# Patient Record
Sex: Male | Born: 1955 | Race: Black or African American | Hispanic: No | Marital: Married | Smoking: Former smoker
Health system: Southern US, Community
[De-identification: ages and names within clinical notes are randomized; demographics above are authoritative.]

## PROBLEM LIST (undated history)

## (undated) ENCOUNTER — Ambulatory Visit (HOSPITAL_COMMUNITY): Admission: EM | Payer: No Typology Code available for payment source

## (undated) DIAGNOSIS — E7800 Pure hypercholesterolemia, unspecified: Secondary | ICD-10-CM

## (undated) DIAGNOSIS — C801 Malignant (primary) neoplasm, unspecified: Secondary | ICD-10-CM

## (undated) DIAGNOSIS — S21339A Puncture wound without foreign body of unspecified front wall of thorax with penetration into thoracic cavity, initial encounter: Secondary | ICD-10-CM

## (undated) DIAGNOSIS — W3400XA Accidental discharge from unspecified firearms or gun, initial encounter: Secondary | ICD-10-CM

## (undated) DIAGNOSIS — I10 Essential (primary) hypertension: Secondary | ICD-10-CM

## (undated) HISTORY — PX: KNEE SURGERY: SHX244

## (undated) HISTORY — PX: ANKLE SURGERY: SHX546

---

## 2008-05-12 DIAGNOSIS — K7460 Unspecified cirrhosis of liver: Secondary | ICD-10-CM | POA: Diagnosis present

## 2011-10-12 ENCOUNTER — Encounter (HOSPITAL_COMMUNITY): Payer: Self-pay | Admitting: Emergency Medicine

## 2011-10-12 ENCOUNTER — Emergency Department (HOSPITAL_COMMUNITY)
Admission: EM | Admit: 2011-10-12 | Discharge: 2011-10-12 | Disposition: A | Discharge status: Home / Self Care | Payer: No Typology Code available for payment source | Attending: Emergency Medicine | Admitting: Emergency Medicine

## 2011-10-12 ENCOUNTER — Emergency Department (HOSPITAL_COMMUNITY): Payer: No Typology Code available for payment source

## 2011-10-12 DIAGNOSIS — Z79899 Other long term (current) drug therapy: Secondary | ICD-10-CM | POA: Insufficient documentation

## 2011-10-12 DIAGNOSIS — Z87891 Personal history of nicotine dependence: Secondary | ICD-10-CM | POA: Insufficient documentation

## 2011-10-12 DIAGNOSIS — E7800 Pure hypercholesterolemia, unspecified: Secondary | ICD-10-CM | POA: Insufficient documentation

## 2011-10-12 DIAGNOSIS — I10 Essential (primary) hypertension: Secondary | ICD-10-CM | POA: Insufficient documentation

## 2011-10-12 DIAGNOSIS — S40019A Contusion of unspecified shoulder, initial encounter: Secondary | ICD-10-CM

## 2011-10-12 DIAGNOSIS — Z794 Long term (current) use of insulin: Secondary | ICD-10-CM | POA: Insufficient documentation

## 2011-10-12 DIAGNOSIS — E119 Type 2 diabetes mellitus without complications: Secondary | ICD-10-CM | POA: Insufficient documentation

## 2011-10-12 DIAGNOSIS — Z043 Encounter for examination and observation following other accident: Secondary | ICD-10-CM | POA: Insufficient documentation

## 2011-10-12 HISTORY — DX: Pure hypercholesterolemia, unspecified: E78.00

## 2011-10-12 HISTORY — DX: Essential (primary) hypertension: I10

## 2011-10-12 HISTORY — DX: Puncture wound without foreign body of unspecified front wall of thorax with penetration into thoracic cavity, initial encounter: S21.339A

## 2011-10-12 HISTORY — DX: Accidental discharge from unspecified firearms or gun, initial encounter: W34.00XA

## 2011-10-12 MED ORDER — CYCLOBENZAPRINE HCL 10 MG PO TABS: 10.0000 mg | ORAL_TABLET | Freq: Two times a day (BID) | ORAL | Status: AC | PRN

## 2011-10-12 MED ORDER — IBUPROFEN 800 MG PO TABS: 800.0000 mg | ORAL_TABLET | Freq: Three times a day (TID) | ORAL | Status: AC

## 2011-10-12 NOTE — ED Notes (Signed)
CBG @ 241
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
## 2011-10-12 NOTE — Discharge Instructions (Signed)
Contusion  A contusion is a deep bruise. Contusions are the result of an injury that caused bleeding under the skin. The contusion may turn blue, purple, or yellow. Minor injuries will give you a painless contusion, but more severe contusions may stay painful and swollen for a few weeks.   CAUSES   A contusion is usually caused by a blow, trauma, or direct force to an area of the body.  SYMPTOMS    Swelling and redness of the injured area.   Bruising of the injured area.   Tenderness and soreness of the injured area.   Pain.  DIAGNOSIS   The diagnosis can be made by taking a history and physical exam. An X-ray, CT scan, or MRI may be needed to determine if there were any associated injuries, such as fractures.  TREATMENT   Specific treatment will depend on what area of the body was injured. In general, the best treatment for a contusion is resting, icing, elevating, and applying cold compresses to the injured area. Over-the-counter medicines may also be recommended for pain control. Ask your caregiver what the best treatment is for your contusion.  HOME CARE INSTRUCTIONS    Put ice on the injured area.   Put ice in a plastic bag.   Place a towel between your skin and the bag.   Leave the ice on for 15 to 20 minutes, 3 to 4 times a day.   Only take over-the-counter or prescription medicines for pain, discomfort, or fever as directed by your caregiver. Your caregiver may recommend avoiding anti-inflammatory medicines (aspirin, ibuprofen, and naproxen) for 48 hours because these medicines may increase bruising.   Rest the injured area.   If possible, elevate the injured area to reduce swelling.  SEEK IMMEDIATE MEDICAL CARE IF:    You have increased bruising or swelling.   You have pain that is getting worse.   Your swelling or pain is not relieved with medicines.  MAKE SURE YOU:    Understand these instructions.   Will watch your condition.   Will get help right away if you are not doing well or get  worse.  Document Released: 02/05/2005 Document Revised: 04/17/2011 Document Reviewed: 03/03/2011  ExitCare Patient Information 2012 [employer].  Motor Vehicle Collision   It is common to have multiple bruises and sore muscles after a motor vehicle collision (MVC). These tend to feel worse for the first 24 hours. You may have the most stiffness and soreness over the first several hours. You may also feel worse when you wake up the first morning after your collision. After this point, you will usually begin to improve with each day. The speed of improvement often depends on the severity of the collision, the number of injuries, and the location and nature of these injuries.  HOME CARE INSTRUCTIONS    Put ice on the injured area.   Put ice in a plastic bag.   Place a towel between your skin and the bag.   Leave the ice on for 15 to 20 minutes, 3 to 4 times a day.   Drink enough fluids to keep your urine clear or pale yellow. Do not drink alcohol.   Take a warm shower or bath once or twice a day. This will increase blood flow to sore muscles.   You may return to activities as directed by your caregiver. Be careful when lifting, as this may aggravate neck or back pain.   Only take over-the-counter or prescription medicines   for pain, discomfort, or fever as directed by your caregiver. Do not use aspirin. This may increase bruising and bleeding.  SEEK IMMEDIATE MEDICAL CARE IF:   You have numbness, tingling, or weakness in the arms or legs.   You develop severe headaches not relieved with medicine.   You have severe neck pain, especially tenderness in the middle of the back of your neck.   You have changes in bowel or bladder control.   There is increasing pain in any area of the body.   You have shortness of breath, lightheadedness, dizziness, or fainting.   You have chest pain.   You feel sick to your stomach (nauseous), throw up (vomit), or sweat.   You have increasing abdominal  discomfort.   There is blood in your urine, stool, or vomit.   You have pain in your shoulder (shoulder strap areas).   You feel your symptoms are getting worse.  MAKE SURE YOU:    Understand these instructions.   Will watch your condition.   Will get help right away if you are not doing well or get worse.  Document Released: 04/28/2005 Document Revised: 04/17/2011 Document Reviewed: 09/25/2010  ExitCare Patient Information 2012 [employer].
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
## 2013-09-01 ENCOUNTER — Emergency Department (HOSPITAL_COMMUNITY)
Admission: EM | Admit: 2013-09-01 | Discharge: 2013-09-02 | Disposition: A | Discharge status: Home / Self Care | Payer: Non-veteran care | Attending: Emergency Medicine | Admitting: Emergency Medicine

## 2013-09-01 ENCOUNTER — Encounter (HOSPITAL_COMMUNITY): Payer: Self-pay | Admitting: Emergency Medicine

## 2013-09-01 DIAGNOSIS — K047 Periapical abscess without sinus: Secondary | ICD-10-CM | POA: Insufficient documentation

## 2013-09-01 DIAGNOSIS — E119 Type 2 diabetes mellitus without complications: Secondary | ICD-10-CM | POA: Insufficient documentation

## 2013-09-01 DIAGNOSIS — Z794 Long term (current) use of insulin: Secondary | ICD-10-CM | POA: Insufficient documentation

## 2013-09-01 DIAGNOSIS — Z79899 Other long term (current) drug therapy: Secondary | ICD-10-CM | POA: Insufficient documentation

## 2013-09-01 DIAGNOSIS — I10 Essential (primary) hypertension: Secondary | ICD-10-CM | POA: Insufficient documentation

## 2013-09-01 DIAGNOSIS — E7800 Pure hypercholesterolemia, unspecified: Secondary | ICD-10-CM | POA: Insufficient documentation

## 2013-09-01 DIAGNOSIS — R609 Edema, unspecified: Secondary | ICD-10-CM | POA: Insufficient documentation

## 2013-09-01 DIAGNOSIS — Z87891 Personal history of nicotine dependence: Secondary | ICD-10-CM | POA: Insufficient documentation

## 2013-09-01 DIAGNOSIS — K029 Dental caries, unspecified: Secondary | ICD-10-CM

## 2013-09-01 DIAGNOSIS — K0262 Dental caries on smooth surface penetrating into dentin: Secondary | ICD-10-CM | POA: Insufficient documentation

## 2013-09-01 LAB — CBG MONITORING, ED: GLUCOSE-CAPILLARY: 88 mg/dL (ref 70–99)

## 2013-09-01 MED ORDER — PENICILLIN V POTASSIUM 500 MG PO TABS: 500.0000 mg | ORAL_TABLET | Freq: Four times a day (QID) | ORAL | Status: AC

## 2013-09-01 MED ORDER — OXYCODONE-ACETAMINOPHEN 5-325 MG PO TABS
2.0000 | ORAL_TABLET | Freq: Once | ORAL | Status: AC
  Administered 2013-09-02: 2 via ORAL
  Filled 2013-09-01: qty 2

## 2013-09-01 MED ORDER — OXYCODONE-ACETAMINOPHEN 5-325 MG PO TABS: 2.0000 | ORAL_TABLET | ORAL | Status: DC | PRN

## 2013-09-01 MED ORDER — PENICILLIN V POTASSIUM 250 MG PO TABS
500.0000 mg | ORAL_TABLET | Freq: Once | ORAL | Status: AC
  Administered 2013-09-02: 500 mg via ORAL
  Filled 2013-09-01: qty 2

## 2013-09-01 NOTE — ED Notes (Signed)
Patient to ED with C/O pain in his left upper jaw.  States that his teeth are going bad.  Swelling is noted in his left upper lip that extends to his eye.  Area is very painful to touch.  No drainage is noted.

## 2013-09-01 NOTE — Discharge Instructions (Signed)
Abscessed Tooth An abscessed tooth is an infection around your tooth. It may be caused by holes or damage to the tooth (cavity) or a dental disease. An abscessed tooth causes mild to very bad pain in and around the tooth. See your dentist right away if you have tooth or gum pain. HOME CARE  Take your medicine as told. Finish it even if you start to feel better.  Do not drive after taking pain medicine.  Rinse your mouth (gargle) often with salt water ( teaspoon salt in 8 ounces of warm water).  Do not apply heat to the outside of your face. GET HELP RIGHT AWAY IF:   You have a temperature by mouth above 102 F (38.9 C), not controlled by medicine.  You have chills and a very bad headache.  You have problems breathing or swallowing.  Your mouth will not open.  You develop puffiness (swelling) on the neck or around the eye.  Your pain is not helped by medicine. Dental Care and Dentist Visits Dental care supports good overall health. Regular dental visits can also help you avoid dental pain, bleeding, infection, and other more serious health problems in the future. It is important to keep the mouth healthy because diseases in the teeth, gums, and other oral tissues can spread to other areas of the body. Some problems, such as diabetes, heart disease, and pre-term labor have been associated with poor oral health.  See your dentist every 6 months. If you experience emergency problems such as a toothache or broken tooth, go to the dentist right away. If you see your dentist regularly, you may catch problems early. It is easier to be treated for problems in the early stages.  WHAT TO EXPECT AT A DENTIST VISIT  Your dentist will look for many common oral health problems and recommend proper treatment. At your regular dental visit, you can expect: Gentle cleaning of the teeth and gums. This includes scraping and polishing. This helps to remove the sticky substance around the teeth and gums  (plaque). Plaque forms in the mouth shortly after eating. Over time, plaque hardens on the teeth as tartar. If tartar is not removed regularly, it can cause problems. Cleaning also helps remove stains. Periodic X-rays. These pictures of the teeth and supporting bone will help your dentist assess the health of your teeth. Periodic fluoride treatments. Fluoride is a natural mineral shown to help strengthen teeth. Fluoride treatmentinvolves applying a fluoride gel or varnish to the teeth. It is most commonly done in children. Examination of the mouth, tongue, jaws, teeth, and gums to look for any oral health problems, such as: Cavities (dental caries). This is decay on the tooth caused by plaque, sugar, and acid in the mouth. It is best to catch a cavity when it is small. Inflammation of the gums caused by plaque buildup (gingivitis). Problems with the mouth or malformed or misaligned teeth. Oral cancer or other diseases of the soft tissues or jaws. KEEP YOUR TEETH AND GUMS HEALTHY For healthy teeth and gums, follow these general guidelines as well as your dentist's specific advice: Have your teeth professionally cleaned at the dentist every 6 months. Brush twice daily with a fluoride toothpaste. Floss your teeth daily. Ask your dentist if you need fluoride supplements, treatments, or fluoride toothpaste. Eat a healthy diet. Reduce foods and drinks with added sugar. Avoid smoking. TREATMENT FOR ORAL HEALTH PROBLEMS If you have oral health problems, treatment varies depending on the conditions present in your teeth  and gums. Your caregiver will most likely recommend good oral hygiene at each visit. For cavities, gingivitis, or other oral health disease, your caregiver will perform a procedure to treat the problem. This is typically done at a separate appointment. Sometimes your caregiver will refer you to another dental specialist for specific tooth problems or for surgery. SEEK IMMEDIATE DENTAL  CARE IF: You have pain, bleeding, or soreness in the gum, tooth, jaw, or mouth area. A permanent tooth becomes loose or separated from the gum socket. You experience a blow or injury to the mouth or jaw area. Document Released: 01/08/2011 Document Revised: 07/21/2011 Document Reviewed: 01/08/2011 [facility] Patient Information 2014 [place], [place].  Dental Pain Toothache is pain in or around a tooth. It may get worse with chewing or with cold or heat.  HOME CARE Your dentist may use a numbing medicine during treatment. If so, you may need to avoid eating until the medicine wears off. Ask your dentist about this. Only take medicine as told by your dentist or doctor. Avoid chewing food near the painful tooth until after all treatment is done. Ask your dentist about this. GET HELP RIGHT AWAY IF:  The problem gets worse or new problems appear. You have a fever. There is redness and puffiness (swelling) of the face, jaw, or neck. You cannot open your mouth. There is pain in the jaw. There is very bad pain that is not helped by medicine. MAKE SURE YOU:  Understand these instructions. Will watch your condition. Will get help right away if you are not doing well or get worse. Document Released: 10/15/2007 Document Revised: 07/21/2011 Document Reviewed: 10/15/2007 [facility] Patient Information 2014 [place], [place].   Your pain is getting worse instead of better. MAKE SURE YOU:   Understand these instructions.  Will watch your condition.  Will get help right away if you are not doing well or get worse. Document Released: 10/15/2007 Document Revised: 07/21/2011 Document Reviewed: 08/06/2010 [facility] Patient Information 2014 [place].  If you were a [facility] patient or do not have insurance, here are some clinics in our community that may be able to provide care for free or on sliding payment scales.  [facility], [address] [place], [phone]; Monday to Friday, 9 a.m. - 7 p.m.; Saturday 9 a.m. to 1 p.m.   [facility], [place] [place]; [phone]; or [address]  [place]; [phone].    [facility] [place], [place] [address] [place]; [phone]; Monday to Wednesday, 8:30 a.m. - 5 p.m.; Thursday, 8:30 a.m. - 8 p.m.   [facility], [place], [place]; [phone]; Monday to Friday, 8 a.m. - 4:30 p.m.    [facility], [place] [address], [place], [place]; first and third Saturday of the  month, 9:30 a.m. - 12:30 p.m.   [facility], [address], [place], [place]; second Saturday of the month, 9 a.m. -noon.   RESOURCE GUIDE  Dental Problems  Patients with Medicaid: [place].  [place] Phone:  [phone]                                                                             Phone:  [phone]  If unable to pay or uninsured, contact:  [facility] or [facility]. to become qualified for the adult dental clinic.  Chronic Pain Problems Contact [facility]  [phone] Patients need to be referred by their primary care doctor.  Insufficient Money for Medicine Contact [employer]:  call "211" or [place] [phone].  No Primary Care Doctor Call [facility]  [phone] Other agencies that provide inexpensive medical care    [place]  [phone]    [facility]  [phone]    [facility]  [phone]    [facility]  [place]  [phone] [facility]  [phone] [place]   [phone] (emergency services [phone])  Abuse/Neglect [place] [phone] [place] [phone] (After Hours)  Emergency [place] [phone]  [facility] [place] [phone] [place] [phone]  MRSA Hotline #:   574-485-3263  [facility] Dept. [address]          [place] Phone:  [phone]                                     Phone:  [phone]                   Phone:  [place] Phone:  [place] [phone] (After Hours)   Community Resources: *IF YOU ARE IN IMMEDIATE DANGER CALL 911!  Abuse/Neglect:  Family Services Crisis Hotline [facility]): ([phone] [facility] Against Violence [facility]): 3194244691  After hours, holidays and weekends: [phone] National Domestic Violence Hotline: [place]: [place]: [place]: ([phone]  Health Clinics:  Urgent [place] ([place]): [phone] Monday - Friday  8 AM - 9 PM, Saturday and Sunday 10 AM - 9 PM   [place]: [phone] Monday- Friday 8:30 AM - 5:30 PM, Sat 9 AM - 1 PM  24 HR [place] [facility] [address]: [phone] [facility]: [phone] [facility] [address]: [phone]  [place] [facility]: [address] [phone]

## 2013-09-01 NOTE — ED Notes (Signed)
CBG 88 
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
## 2013-09-01 NOTE — ED Notes (Signed)
Pt reports upper dental pain x months that has progressively increased x 3 days. States "my gums just feel so sore." NAD.

## 2014-06-05 ENCOUNTER — Emergency Department (HOSPITAL_COMMUNITY): Payer: Non-veteran care

## 2014-06-05 ENCOUNTER — Encounter (HOSPITAL_COMMUNITY): Payer: Self-pay | Admitting: Emergency Medicine

## 2014-06-05 ENCOUNTER — Emergency Department (HOSPITAL_COMMUNITY)
Admission: EM | Admit: 2014-06-05 | Discharge: 2014-06-06 | Disposition: A | Discharge status: Home / Self Care | Payer: Non-veteran care | Attending: Emergency Medicine | Admitting: Emergency Medicine

## 2014-06-05 DIAGNOSIS — Y92093 Driveway of other non-institutional residence as the place of occurrence of the external cause: Secondary | ICD-10-CM | POA: Insufficient documentation

## 2014-06-05 DIAGNOSIS — Z794 Long term (current) use of insulin: Secondary | ICD-10-CM | POA: Insufficient documentation

## 2014-06-05 DIAGNOSIS — E119 Type 2 diabetes mellitus without complications: Secondary | ICD-10-CM | POA: Diagnosis not present

## 2014-06-05 DIAGNOSIS — Y9389 Activity, other specified: Secondary | ICD-10-CM | POA: Insufficient documentation

## 2014-06-05 DIAGNOSIS — W000XXA Fall on same level due to ice and snow, initial encounter: Secondary | ICD-10-CM | POA: Insufficient documentation

## 2014-06-05 DIAGNOSIS — I10 Essential (primary) hypertension: Secondary | ICD-10-CM | POA: Diagnosis not present

## 2014-06-05 DIAGNOSIS — Y998 Other external cause status: Secondary | ICD-10-CM | POA: Insufficient documentation

## 2014-06-05 DIAGNOSIS — E780 Pure hypercholesterolemia: Secondary | ICD-10-CM | POA: Insufficient documentation

## 2014-06-05 DIAGNOSIS — W009XXA Unspecified fall due to ice and snow, initial encounter: Secondary | ICD-10-CM

## 2014-06-05 DIAGNOSIS — S8991XA Unspecified injury of right lower leg, initial encounter: Secondary | ICD-10-CM | POA: Diagnosis not present

## 2014-06-05 DIAGNOSIS — Z79899 Other long term (current) drug therapy: Secondary | ICD-10-CM | POA: Insufficient documentation

## 2014-06-05 DIAGNOSIS — S79911A Unspecified injury of right hip, initial encounter: Secondary | ICD-10-CM | POA: Insufficient documentation

## 2014-06-05 DIAGNOSIS — M25061 Hemarthrosis, right knee: Secondary | ICD-10-CM | POA: Insufficient documentation

## 2014-06-05 DIAGNOSIS — S99911A Unspecified injury of right ankle, initial encounter: Secondary | ICD-10-CM | POA: Insufficient documentation

## 2014-06-05 DIAGNOSIS — M25571 Pain in right ankle and joints of right foot: Secondary | ICD-10-CM

## 2014-06-05 DIAGNOSIS — S99921A Unspecified injury of right foot, initial encounter: Secondary | ICD-10-CM | POA: Insufficient documentation

## 2014-06-05 DIAGNOSIS — Z87891 Personal history of nicotine dependence: Secondary | ICD-10-CM | POA: Diagnosis not present

## 2014-06-05 DIAGNOSIS — M25461 Effusion, right knee: Secondary | ICD-10-CM

## 2014-06-05 DIAGNOSIS — M25551 Pain in right hip: Secondary | ICD-10-CM

## 2014-06-05 LAB — CBG MONITORING, ED: GLUCOSE-CAPILLARY: 112 mg/dL — AB (ref 70–99)

## 2014-06-05 MED ORDER — WHITE PETROLATUM GEL
Status: DC | PRN
  Administered 2014-06-05: 23:00:00 via TOPICAL
  Filled 2014-06-05: qty 5

## 2014-06-05 MED ORDER — HYDROMORPHONE HCL 1 MG/ML IJ SOLN
1.0000 mg | Freq: Once | INTRAMUSCULAR | Status: AC
  Administered 2014-06-05: 1 mg via INTRAMUSCULAR
  Filled 2014-06-05: qty 1

## 2014-06-05 MED ORDER — LIDOCAINE HCL (PF) 1 % IJ SOLN
5.0000 mL | Freq: Once | INTRAMUSCULAR | Status: AC
  Administered 2014-06-05: 5 mL via INTRADERMAL
  Filled 2014-06-05: qty 5
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
## 2014-06-05 NOTE — ED Notes (Signed)
Bed: LG49 Expected date: 06/05/14 Expected time: 7:59 PM Means of arrival: Ambulance Comments: Fall, ankle and knee pain

## 2014-06-05 NOTE — ED Notes (Signed)
Patient transported to X-ray 

## 2014-06-06 LAB — SYNOVIAL CELL COUNT + DIFF, W/ CRYSTALS
Crystals, Fluid: NONE SEEN
Eosinophils-Synovial: 0 % (ref 0–1)
LYMPHOCYTES-SYNOVIAL FLD: 4 % (ref 0–20)
MONOCYTE-MACROPHAGE-SYNOVIAL FLUID: 5 % — AB (ref 50–90)
NEUTROPHIL, SYNOVIAL: 91 % — AB (ref 0–25)
WBC, SYNOVIAL: 22000 /mm3 — AB (ref 0–200)

## 2014-06-06 MED ORDER — OXYCODONE-ACETAMINOPHEN 5-325 MG PO TABS
2.0000 | ORAL_TABLET | Freq: Once | ORAL | Status: AC
  Administered 2014-06-06: 2 via ORAL
  Filled 2014-06-06: qty 2

## 2014-06-06 MED ORDER — NAPROXEN 500 MG PO TABS: 500.0000 mg | ORAL_TABLET | Freq: Two times a day (BID) | ORAL | Status: DC

## 2014-06-06 MED ORDER — OXYCODONE-ACETAMINOPHEN 5-325 MG PO TABS: 1.0000 | ORAL_TABLET | ORAL | Status: DC | PRN

## 2014-06-06 MED ORDER — METHOCARBAMOL 500 MG PO TABS: 500.0000 mg | ORAL_TABLET | Freq: Two times a day (BID) | ORAL | Status: DC

## 2014-06-06 NOTE — Discharge Instructions (Signed)
1. Medications: robaxin, naproxyn, percocet, usual home medications 2. Treatment: rest, elevate, gentle stretching of you back if pain occurs, use ice 3. Follow Up: [facility] the [facility] in 3 days for discussion of your diagnoses and further evaluation after today's visit; if you do not have a primary care doctor use the resource guide provided to find one;  Return to the ER for back pain, worsening pain, fevers, loss of bowel or bladder control or other concerning symptoms  Knee Effusion The medical term for having fluid in your knee is effusion. This is often due to an internal derangement of the knee. This means something is wrong inside the knee. Some of the causes of fluid in the knee may be torn cartilage, a torn ligament, or bleeding into the joint from an injury. Your knee is likely more difficult to bend and move. This is often because there is increased pain and pressure in the joint. The time it takes for recovery from a knee effusion depends on different factors, including:   Type of injury.  Your age.  Physical and medical conditions.  Rehabilitation Strategies. How long you will be away from your normal activities will depend on what kind of knee problem you have and how much damage is present. Your knee has two types of cartilage. Articular cartilage covers the bone ends and lets your knee bend and move smoothly. Two menisci, thick pads of cartilage that form a rim inside the joint, help absorb shock and stabilize your knee. Ligaments bind the bones together and support your knee joint. Muscles move the joint, help support your knee, and take stress off the joint itself. CAUSES  Often an effusion in the knee is caused by an injury to one of the menisci. This is often a tear in the cartilage. Recovery after a meniscus injury depends on how much meniscus is damaged and whether you have damaged other knee tissue. Small tears may heal on their own with  conservative treatment. Conservative means rest, limited weight bearing activity and muscle strengthening exercises. Your recovery may take up to 6 weeks.  TREATMENT  Larger tears may require surgery. Meniscus injuries may be treated during arthroscopy. Arthroscopy is a procedure in which your surgeon uses a small telescope like instrument to look in your knee. Your caregiver can make a more accurate diagnosis (learning what is wrong) by performing an arthroscopic procedure. If your injury is on the inner margin of the meniscus, your surgeon may trim the meniscus back to a smooth rim. In other cases your surgeon will try to repair a damaged meniscus with stitches (sutures). This may make rehabilitation take longer, but may provide better long term result by helping your knee keep its shock absorption capabilities. Ligaments which are completely torn usually require surgery for repair. HOME CARE INSTRUCTIONS  Use crutches as instructed.  If a brace is applied, use as directed.  Once you are home, an ice pack applied to your swollen knee may help with discomfort and help decrease swelling.  Keep your knee raised (elevated) when you are not up and around or on crutches.  Only take over-the-counter or prescription medicines for pain, discomfort, or fever as directed by your caregiver.  Your caregivers will help with instructions for rehabilitation of your knee. This often includes strengthening exercises.  You may resume a normal diet and activities as directed. SEEK MEDICAL CARE IF:   There is increased swelling in your knee.  You notice  redness, swelling, or increasing pain in your knee.  An unexplained oral temperature above 102 F (38.9 C) develops. SEEK IMMEDIATE MEDICAL CARE IF:   You develop a rash.  You have difficulty breathing.  You have any allergic reactions from medications you may have been given.  There is severe pain with any motion of the knee. MAKE SURE YOU:    Understand these instructions.  Will watch your condition.  Will get help right away if you are not doing well or get worse. Document Released: 07/19/2003 Document Revised: 07/21/2011 Document Reviewed: 09/22/2007 [facility] Patient Information 2015 [place], [place]. This information is not intended to replace advice given to you by your health care provider. Make sure you discuss any questions you have with your health care provider.

## 2014-06-06 NOTE — ED Notes (Signed)
Pt ambulating independently w/ steady gait on d/c in no acute distress, A&Ox4. D/c instructions reviewed w/ pt and family - pt and family deny any further questions or concerns at present. Rx given x3  

## 2014-12-07 ENCOUNTER — Ambulatory Visit (INDEPENDENT_AMBULATORY_CARE_PROVIDER_SITE_OTHER): Payer: Self-pay | Admitting: Family Medicine

## 2014-12-07 VITALS — BP 122/84 | HR 92 | Temp 98.3°F | Resp 16 | Ht 69.25 in | Wt 180.0 lb

## 2014-12-07 DIAGNOSIS — Z008 Encounter for other general examination: Secondary | ICD-10-CM

## 2014-12-07 DIAGNOSIS — Z0289 Encounter for other administrative examinations: Secondary | ICD-10-CM
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
## 2019-05-13 DIAGNOSIS — D62 Acute posthemorrhagic anemia: Secondary | ICD-10-CM | POA: Diagnosis present

## 2019-09-11 ENCOUNTER — Encounter (HOSPITAL_COMMUNITY): Payer: Self-pay | Admitting: Emergency Medicine

## 2019-09-11 ENCOUNTER — Other Ambulatory Visit: Payer: Self-pay

## 2019-09-11 ENCOUNTER — Emergency Department (HOSPITAL_COMMUNITY)
Admission: EM | Admit: 2019-09-11 | Discharge: 2019-09-11 | Disposition: A | Discharge status: Home / Self Care | Payer: No Typology Code available for payment source | Attending: Emergency Medicine | Admitting: Emergency Medicine

## 2019-09-11 ENCOUNTER — Emergency Department (HOSPITAL_COMMUNITY): Payer: No Typology Code available for payment source

## 2019-09-11 DIAGNOSIS — R202 Paresthesia of skin: Secondary | ICD-10-CM | POA: Insufficient documentation

## 2019-09-11 DIAGNOSIS — Z5321 Procedure and treatment not carried out due to patient leaving prior to being seen by health care provider: Secondary | ICD-10-CM | POA: Insufficient documentation

## 2019-09-11 LAB — CBG MONITORING, ED: Glucose-Capillary: 45 mg/dL — ABNORMAL LOW (ref 70–99)

## 2019-09-11 LAB — BASIC METABOLIC PANEL
Anion gap: 9 (ref 5–15)
BUN: 20 mg/dL (ref 8–23)
CO2: 23 mmol/L (ref 22–32)
Calcium: 10.3 mg/dL (ref 8.9–10.3)
Chloride: 107 mmol/L (ref 98–111)
Creatinine, Ser: 1.81 mg/dL — ABNORMAL HIGH (ref 0.61–1.24)
GFR calc Af Amer: 44 mL/min — ABNORMAL LOW (ref >60)
GFR calc non Af Amer: 38 mL/min — ABNORMAL LOW (ref >60)
Glucose, Bld: 165 mg/dL — ABNORMAL HIGH (ref 70–99)
Potassium: 4.4 mmol/L (ref 3.5–5.1)
Sodium: 139 mmol/L (ref 135–145)

## 2019-09-11 LAB — CBC
HCT: 37.2 % — ABNORMAL LOW (ref 39.0–52.0)
Hemoglobin: 11.9 g/dL — ABNORMAL LOW (ref 13.0–17.0)
MCH: 29 pg (ref 26.0–34.0)
MCHC: 32 g/dL (ref 30.0–36.0)
MCV: 90.5 fL (ref 80.0–100.0)
Platelets: 166 10*3/uL (ref 150–400)
RBC: 4.11 MIL/uL — ABNORMAL LOW (ref 4.22–5.81)
RDW: 12.5 % (ref 11.5–15.5)
WBC: 6.7 10*3/uL (ref 4.0–10.5)
nRBC: 0 % (ref 0.0–0.2)

## 2019-09-11 LAB — TROPONIN I (HIGH SENSITIVITY): Troponin I (High Sensitivity): 5 ng/L (ref <18)

## 2019-09-11 MED ORDER — SODIUM CHLORIDE 0.9% FLUSH: 3.0000 mL | Freq: Once | INTRAVENOUS | Status: DC

## 2019-09-11 NOTE — ED Notes (Signed)
CBG 45, pt given orange juice, coke, and sandwich. RN [employer] notified

## 2019-09-11 NOTE — ED Notes (Signed)
Pt stopped NT in lobby and asked for something to eat or drink because he felt like sugar was low.  Pt's blood sugar checked and it was 45.  Pt given orange juice and soda by NT.  RN spoke with pt's wife that was concerned that no one had given pt something to drink prior to this time.  Explained that pt had asked for something to drink just after being triaged and that he was told to wait to see doctor. Pt did not mention blood sugar at that time and initial glucose on labs 165.  Attempted to talk to pt and he states he wants to be left alone and refuses to answer questions.  Wife had stepped outside and came back in again.  Questioned if pt had taken anything for his blood sugar since here or PTA since there was a drop from 165 to 45 in just over 1 hr.  Wife states when pt was at [facility] his blood sugar was 200 and he took his diabetes medications without eating and came to ED.  Pt wanting to leave.  Encouraged pt to stay and explained that pt had not mentioned feelings of hypoglycemia or that he had taken meds without eating just PTA.  Encouraged pt to eat and explained that we would check sugar again.  Pt states he is leaving and walked outside.  Wife states that she is not going to let him leave due to chest pain and she went outside to talk with him.

## 2019-09-11 NOTE — ED Triage Notes (Signed)
Pt sent from urgent care for further evaluation of left arm tingling and chest pain. Pt reports symptoms have been intermittent since receiving his first dose covid vaccine in March.

## 2019-12-12 ENCOUNTER — Other Ambulatory Visit: Payer: Self-pay | Admitting: Orthopedic Surgery

## 2019-12-12 DIAGNOSIS — M79662 Pain in left lower leg: Secondary | ICD-10-CM

## 2019-12-13 ENCOUNTER — Other Ambulatory Visit: Payer: Self-pay | Admitting: Orthopedic Surgery

## 2019-12-14 ENCOUNTER — Other Ambulatory Visit: Payer: No Typology Code available for payment source

## 2019-12-15 ENCOUNTER — Other Ambulatory Visit: Payer: Self-pay | Admitting: Orthopedic Surgery

## 2020-03-06 DIAGNOSIS — I10 Essential (primary) hypertension: Secondary | ICD-10-CM | POA: Diagnosis present

## 2020-03-06 DIAGNOSIS — G8929 Other chronic pain: Secondary | ICD-10-CM | POA: Insufficient documentation

## 2020-03-06 DIAGNOSIS — F32A Depression, unspecified: Secondary | ICD-10-CM | POA: Diagnosis present

## 2020-03-06 DIAGNOSIS — E1159 Type 2 diabetes mellitus with other circulatory complications: Secondary | ICD-10-CM | POA: Diagnosis present

## 2020-03-06 DIAGNOSIS — E785 Hyperlipidemia, unspecified: Secondary | ICD-10-CM | POA: Diagnosis present

## 2020-05-24 ENCOUNTER — Encounter (HOSPITAL_COMMUNITY): Payer: Self-pay

## 2020-05-24 ENCOUNTER — Emergency Department (HOSPITAL_COMMUNITY): Payer: No Typology Code available for payment source

## 2020-05-24 ENCOUNTER — Other Ambulatory Visit: Payer: Self-pay | Admitting: Student

## 2020-05-24 ENCOUNTER — Other Ambulatory Visit: Payer: Self-pay

## 2020-05-24 ENCOUNTER — Emergency Department (HOSPITAL_COMMUNITY)
Admission: EM | Admit: 2020-05-24 | Discharge: 2020-05-24 | Disposition: A | Discharge status: Home / Self Care | Payer: No Typology Code available for payment source | Attending: Emergency Medicine | Admitting: Emergency Medicine

## 2020-05-24 DIAGNOSIS — E785 Hyperlipidemia, unspecified: Secondary | ICD-10-CM

## 2020-05-24 DIAGNOSIS — Z87891 Personal history of nicotine dependence: Secondary | ICD-10-CM | POA: Insufficient documentation

## 2020-05-24 DIAGNOSIS — R079 Chest pain, unspecified: Secondary | ICD-10-CM | POA: Diagnosis present

## 2020-05-24 DIAGNOSIS — Z7984 Long term (current) use of oral hypoglycemic drugs: Secondary | ICD-10-CM | POA: Insufficient documentation

## 2020-05-24 DIAGNOSIS — I10 Essential (primary) hypertension: Secondary | ICD-10-CM

## 2020-05-24 DIAGNOSIS — I200 Unstable angina: Secondary | ICD-10-CM | POA: Diagnosis not present

## 2020-05-24 DIAGNOSIS — Z20822 Contact with and (suspected) exposure to covid-19: Secondary | ICD-10-CM | POA: Insufficient documentation

## 2020-05-24 DIAGNOSIS — Z794 Long term (current) use of insulin: Secondary | ICD-10-CM | POA: Diagnosis not present

## 2020-05-24 DIAGNOSIS — E119 Type 2 diabetes mellitus without complications: Secondary | ICD-10-CM | POA: Diagnosis not present

## 2020-05-24 DIAGNOSIS — R0789 Other chest pain: Secondary | ICD-10-CM

## 2020-05-24 DIAGNOSIS — Z79899 Other long term (current) drug therapy: Secondary | ICD-10-CM | POA: Insufficient documentation

## 2020-05-24 DIAGNOSIS — R55 Syncope and collapse: Secondary | ICD-10-CM

## 2020-05-24 LAB — CBC
HCT: 34.8 % — ABNORMAL LOW (ref 39.0–52.0)
Hemoglobin: 11.6 g/dL — ABNORMAL LOW (ref 13.0–17.0)
MCH: 30.1 pg (ref 26.0–34.0)
MCHC: 33.3 g/dL (ref 30.0–36.0)
MCV: 90.2 fL (ref 80.0–100.0)
Platelets: 168 10*3/uL (ref 150–400)
RBC: 3.86 MIL/uL — ABNORMAL LOW (ref 4.22–5.81)
RDW: 13.2 % (ref 11.5–15.5)
WBC: 6.2 10*3/uL (ref 4.0–10.5)
nRBC: 0 % (ref 0.0–0.2)

## 2020-05-24 LAB — TROPONIN I (HIGH SENSITIVITY)
Troponin I (High Sensitivity): 7 ng/L (ref <18)
Troponin I (High Sensitivity): 6 ng/L (ref <18)

## 2020-05-24 LAB — BASIC METABOLIC PANEL
Anion gap: 10 (ref 5–15)
BUN: 22 mg/dL (ref 8–23)
CO2: 25 mmol/L (ref 22–32)
Calcium: 9.6 mg/dL (ref 8.9–10.3)
Chloride: 104 mmol/L (ref 98–111)
Creatinine, Ser: 1.57 mg/dL — ABNORMAL HIGH (ref 0.61–1.24)
GFR, Estimated: 48 mL/min — ABNORMAL LOW (ref >60)
Glucose, Bld: 189 mg/dL — ABNORMAL HIGH (ref 70–99)
Potassium: 4.4 mmol/L (ref 3.5–5.1)
Sodium: 139 mmol/L (ref 135–145)

## 2020-05-24 LAB — CBG MONITORING, ED
Glucose-Capillary: 178 mg/dL — ABNORMAL HIGH (ref 70–99)
Glucose-Capillary: 144 mg/dL — ABNORMAL HIGH (ref 70–99)

## 2020-05-24 LAB — POC SARS CORONAVIRUS 2 AG -  ED: SARS Coronavirus 2 Ag: NEGATIVE

## 2020-05-24 NOTE — Discharge Instructions (Signed)
You been seen here for chest pain.  Lab work and imaging all look reassuring.  I recommend continue with your medications as prescribed.  I want you to follow-up with cardiology for further evaluation.  His office will contact you to schedule an outpatient stress test.  Come back to the emergency department if you develop chest pain, shortness of breath, severe abdominal pain, uncontrolled nausea, vomiting, diarrhea.

## 2020-05-24 NOTE — Progress Notes (Signed)
Ordered outpatient Lexiscan Myoview for further evaluation of chest pain. Please see consult note from today for more information.  The risks [chest pain, shortness of breath, cardiac arrhythmias, dizziness, blood pressure fluctuations, myocardial infarction, stroke/transient ischemic attack, nausea, vomiting, allergic reaction, radiation exposure, metallic taste sensation and life-threatening complications (estimated to be 1 in 10,000)], benefits (risk stratification, diagnosing coronary artery disease, treatment guidance) and alternatives of a nuclear stress test were discussed in detail with Mr. Sandy and he agrees to proceed.  Darreld Mclean, PA-C 05/24/2020 5:20 PM
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
## 2020-05-24 NOTE — ED Triage Notes (Signed)
Pt reports chest pain that radiates to his left arm and left side of his neck that started about 3 days ago. Denies n/v but also reports sob. Pt has had all his COVID vaccines plus booster. Pt a.o, resp e.u

## 2020-05-24 NOTE — ED Provider Notes (Signed)
Patient was received at shift change from [facility], he provided HPI, current work-up, likely disposition.  Please see his note for full detail.  In short patient with significant medical history of diabetes, hypertension, presents to the emergency room with chief complaint of intermittent left-sided chest pain rating to his neck down left arm for the last week.  Patient endorses associated shortness of breath and exertion making the pain worse.  Last episode of chest pain was this morning, currently chest pain-free at this time.  Patient has no complaints at this time.  He has no history of DVTs or PEs, not on hormone therapy, denies recent traumas, long immobilizations, leg pain or leg swelling.  Patient is vaccinated against COVID.   Per previous provider patient had a heart score of 7 and cardiology was consulted for further recommendations.  We'll wait for Dr. Warren Lacy recommendations for admission or close patient follow-up.    Current work-up shows no signs of leukocytosis, normocytic anemia appears to be at baseline for patient.  BMP shows no electrolyte abnormalities, no metabolic acidosis, slight hyperglycemia of 189, slight increase in creatinine of 1.57, no anion gap present.  Patient had initial troponin of 7-second opponent is 6, COVID test is negative.  EKG is normal sinus rhythm without signs of ischemia.  Chest x-ray does not reveal any acute findings. Physical Exam  BP (!) 181/97   Pulse 67   Temp 98.6 F (37 C) (Oral)   Resp 18   Ht 5\' 10"  (1.778 m)   Wt 79.4 kg   SpO2 100%   BMI 25.11 kg/m   Physical Exam Vitals and nursing note reviewed.  Constitutional:      General: He is not in acute distress.    Appearance: Normal appearance. He is not ill-appearing or diaphoretic.  HENT:     Head: Normocephalic and atraumatic.     Nose: No congestion or rhinorrhea.  Eyes:     Conjunctiva/sclera: Conjunctivae normal.  Cardiovascular:     Rate and Rhythm: Normal rate and  regular rhythm.  Pulmonary:     Effort: Pulmonary effort is normal.  Musculoskeletal:     Cervical back: Neck supple.     Right lower leg: No edema.     Left lower leg: No edema.     Comments: Patient is moving all 4 extremities out difficulty.  Skin:    General: Skin is warm and dry.     Coloration: Skin is not jaundiced or pale.  Neurological:     Mental Status: He is alert.     Comments:  Patient having no difficulty with word finding.  Psychiatric:        Mood and Affect: Mood normal.     ED Course/Procedures      Procedures  MDM  Dr. Warren Lacy of cardiology evaluated the patient and feels patient is safe for discharge.  He will arrange for outpatient stress test.  His office will contact the patient to schedule an appointment.  Vital signs have remained stable, no indication for hospital admission.   Patient given at home care as well strict return precautions.  Patient verbalized that they understood agreed to said plan.       Marcello Fennel, PA-C 05/24/20 1718    Horton, Alvin Critchley, DO 05/25/20 8299
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
## 2020-06-05 ENCOUNTER — Encounter (HOSPITAL_COMMUNITY): Payer: Self-pay | Admitting: Student

## 2020-06-19 ENCOUNTER — Telehealth (HOSPITAL_COMMUNITY): Payer: Self-pay | Admitting: Student

## 2020-06-19 NOTE — Telephone Encounter (Signed)
Just an FYI. We have made several attempts to contact this patient including sending a letter to schedule or reschedule their Myoview. We will be removing the patient from the echo/nuc  WQ.    06/05/20 MAILED LETTER [facility]  06/05/20 [facility] to schedule @ 9:57/[facility]  05/30/20 [facility] to schedule @ 11:19/[facility]  05/25/2020 [facility] to schedule @ 1:47/LBW       Thank you

## 2020-11-06 DIAGNOSIS — N1832 Chronic kidney disease, stage 3b: Secondary | ICD-10-CM | POA: Diagnosis present

## 2021-01-21 DIAGNOSIS — F1410 Cocaine abuse, uncomplicated: Secondary | ICD-10-CM | POA: Diagnosis present

## 2021-05-17 ENCOUNTER — Encounter (HOSPITAL_BASED_OUTPATIENT_CLINIC_OR_DEPARTMENT_OTHER): Payer: Self-pay | Admitting: Obstetrics and Gynecology

## 2021-05-17 ENCOUNTER — Other Ambulatory Visit: Payer: Self-pay

## 2021-05-17 DIAGNOSIS — R42 Dizziness and giddiness: Secondary | ICD-10-CM | POA: Insufficient documentation

## 2021-05-17 DIAGNOSIS — Z20822 Contact with and (suspected) exposure to covid-19: Secondary | ICD-10-CM | POA: Diagnosis not present

## 2021-05-17 DIAGNOSIS — R5383 Other fatigue: Secondary | ICD-10-CM | POA: Insufficient documentation

## 2021-05-17 DIAGNOSIS — R739 Hyperglycemia, unspecified: Secondary | ICD-10-CM | POA: Diagnosis present

## 2021-05-17 DIAGNOSIS — Z5321 Procedure and treatment not carried out due to patient leaving prior to being seen by health care provider: Secondary | ICD-10-CM | POA: Insufficient documentation

## 2021-05-17 LAB — RESP PANEL BY RT-PCR (FLU A&B, COVID) ARPGX2
Influenza A by PCR: NEGATIVE
Influenza B by PCR: NEGATIVE
SARS Coronavirus 2 by RT PCR: NEGATIVE

## 2021-05-17 LAB — CBG MONITORING, ED
Glucose-Capillary: 309 mg/dL — ABNORMAL HIGH (ref 70–99)
Glucose-Capillary: 297 mg/dL — ABNORMAL HIGH (ref 70–99)

## 2021-05-17 LAB — BASIC METABOLIC PANEL
Anion gap: 9 (ref 5–15)
BUN: 27 mg/dL — ABNORMAL HIGH (ref 8–23)
CO2: 25 mmol/L (ref 22–32)
Calcium: 10.2 mg/dL (ref 8.9–10.3)
Chloride: 99 mmol/L (ref 98–111)
Creatinine, Ser: 1.6 mg/dL — ABNORMAL HIGH (ref 0.61–1.24)
GFR, Estimated: 47 mL/min — ABNORMAL LOW (ref >60)
Glucose, Bld: 301 mg/dL — ABNORMAL HIGH (ref 70–99)
Potassium: 4.3 mmol/L (ref 3.5–5.1)
Sodium: 133 mmol/L — ABNORMAL LOW (ref 135–145)

## 2021-05-17 LAB — URINALYSIS, ROUTINE W REFLEX MICROSCOPIC
Bilirubin Urine: NEGATIVE
Glucose, UA: >1000 mg/dL — AB
Hgb urine dipstick: NEGATIVE
Ketones, ur: NEGATIVE mg/dL
Leukocytes,Ua: NEGATIVE
Nitrite: NEGATIVE
Protein, ur: NEGATIVE mg/dL
Specific Gravity, Urine: 1.031 — ABNORMAL HIGH (ref 1.005–1.030)
pH: 5.5 (ref 5.0–8.0)

## 2021-05-17 LAB — CBC
HCT: 37.6 % — ABNORMAL LOW (ref 39.0–52.0)
Hemoglobin: 12.3 g/dL — ABNORMAL LOW (ref 13.0–17.0)
MCH: 28 pg (ref 26.0–34.0)
MCHC: 32.7 g/dL (ref 30.0–36.0)
MCV: 85.5 fL (ref 80.0–100.0)
Platelets: 215 10*3/uL (ref 150–400)
RBC: 4.4 MIL/uL (ref 4.22–5.81)
RDW: 13.2 % (ref 11.5–15.5)
WBC: 7.3 10*3/uL (ref 4.0–10.5)
nRBC: 0 % (ref 0.0–0.2)

## 2021-05-17 NOTE — ED Triage Notes (Signed)
Patient reports to the ER for hyperglycemia, states it has been over 300 all day. Patient reports he has not been eating well but he has not been feeling well. Patient reports no abdominal pain but he has been feeling swimmy headed and fatigued.

## 2021-05-18 ENCOUNTER — Emergency Department (HOSPITAL_BASED_OUTPATIENT_CLINIC_OR_DEPARTMENT_OTHER)
Admission: EM | Admit: 2021-05-18 | Discharge: 2021-05-18 | Disposition: A | Discharge status: Home / Self Care | Payer: No Typology Code available for payment source | Attending: Emergency Medicine | Admitting: Emergency Medicine

## 2021-06-22 ENCOUNTER — Other Ambulatory Visit: Payer: Self-pay

## 2021-10-31 ENCOUNTER — Other Ambulatory Visit: Payer: Self-pay

## 2021-10-31 ENCOUNTER — Emergency Department (HOSPITAL_COMMUNITY): Payer: No Typology Code available for payment source

## 2021-10-31 ENCOUNTER — Encounter (HOSPITAL_COMMUNITY): Payer: Self-pay | Admitting: Emergency Medicine

## 2021-10-31 ENCOUNTER — Inpatient Hospital Stay (HOSPITAL_COMMUNITY)
Admission: EM | Admit: 2021-10-31 | Discharge: 2021-11-04 | DRG: 435 | Disposition: A | Discharge status: Home / Self Care | Payer: No Typology Code available for payment source | Attending: Internal Medicine | Admitting: Internal Medicine

## 2021-10-31 DIAGNOSIS — K766 Portal hypertension: Secondary | ICD-10-CM | POA: Diagnosis present

## 2021-10-31 DIAGNOSIS — D62 Acute posthemorrhagic anemia: Secondary | ICD-10-CM | POA: Diagnosis present

## 2021-10-31 DIAGNOSIS — Z7984 Long term (current) use of oral hypoglycemic drugs: Secondary | ICD-10-CM

## 2021-10-31 DIAGNOSIS — K219 Gastro-esophageal reflux disease without esophagitis: Secondary | ICD-10-CM | POA: Diagnosis present

## 2021-10-31 DIAGNOSIS — C220 Liver cell carcinoma: Secondary | ICD-10-CM | POA: Diagnosis present

## 2021-10-31 DIAGNOSIS — R160 Hepatomegaly, not elsewhere classified: Principal | ICD-10-CM

## 2021-10-31 DIAGNOSIS — I81 Portal vein thrombosis: Secondary | ICD-10-CM | POA: Diagnosis present

## 2021-10-31 DIAGNOSIS — I8510 Secondary esophageal varices without bleeding: Secondary | ICD-10-CM

## 2021-10-31 DIAGNOSIS — E7800 Pure hypercholesterolemia, unspecified: Secondary | ICD-10-CM | POA: Diagnosis present

## 2021-10-31 DIAGNOSIS — F1410 Cocaine abuse, uncomplicated: Secondary | ICD-10-CM | POA: Diagnosis present

## 2021-10-31 DIAGNOSIS — B3781 Candidal esophagitis: Secondary | ICD-10-CM | POA: Diagnosis not present

## 2021-10-31 DIAGNOSIS — E1159 Type 2 diabetes mellitus with other circulatory complications: Secondary | ICD-10-CM | POA: Diagnosis present

## 2021-10-31 DIAGNOSIS — Z8249 Family history of ischemic heart disease and other diseases of the circulatory system: Secondary | ICD-10-CM

## 2021-10-31 DIAGNOSIS — Z79899 Other long term (current) drug therapy: Secondary | ICD-10-CM | POA: Diagnosis not present

## 2021-10-31 DIAGNOSIS — E1122 Type 2 diabetes mellitus with diabetic chronic kidney disease: Secondary | ICD-10-CM | POA: Diagnosis present

## 2021-10-31 DIAGNOSIS — E11319 Type 2 diabetes mellitus with unspecified diabetic retinopathy without macular edema: Secondary | ICD-10-CM | POA: Diagnosis present

## 2021-10-31 DIAGNOSIS — Z800 Family history of malignant neoplasm of digestive organs: Secondary | ICD-10-CM | POA: Diagnosis not present

## 2021-10-31 DIAGNOSIS — Z87891 Personal history of nicotine dependence: Secondary | ICD-10-CM

## 2021-10-31 DIAGNOSIS — K229 Disease of esophagus, unspecified: Secondary | ICD-10-CM | POA: Diagnosis not present

## 2021-10-31 DIAGNOSIS — D649 Anemia, unspecified: Secondary | ICD-10-CM

## 2021-10-31 DIAGNOSIS — C801 Malignant (primary) neoplasm, unspecified: Secondary | ICD-10-CM

## 2021-10-31 DIAGNOSIS — F32A Depression, unspecified: Secondary | ICD-10-CM | POA: Diagnosis present

## 2021-10-31 DIAGNOSIS — N1832 Chronic kidney disease, stage 3b: Secondary | ICD-10-CM | POA: Diagnosis present

## 2021-10-31 DIAGNOSIS — Z794 Long term (current) use of insulin: Secondary | ICD-10-CM

## 2021-10-31 DIAGNOSIS — E440 Moderate protein-calorie malnutrition: Secondary | ICD-10-CM | POA: Insufficient documentation

## 2021-10-31 DIAGNOSIS — E785 Hyperlipidemia, unspecified: Secondary | ICD-10-CM | POA: Diagnosis present

## 2021-10-31 DIAGNOSIS — B182 Chronic viral hepatitis C: Secondary | ICD-10-CM | POA: Diagnosis present

## 2021-10-31 DIAGNOSIS — K7460 Unspecified cirrhosis of liver: Secondary | ICD-10-CM | POA: Diagnosis present

## 2021-10-31 DIAGNOSIS — N433 Hydrocele, unspecified: Secondary | ICD-10-CM

## 2021-10-31 DIAGNOSIS — Z6821 Body mass index (BMI) 21.0-21.9, adult: Secondary | ICD-10-CM | POA: Diagnosis not present

## 2021-10-31 DIAGNOSIS — K3189 Other diseases of stomach and duodenum: Secondary | ICD-10-CM

## 2021-10-31 DIAGNOSIS — I129 Hypertensive chronic kidney disease with stage 1 through stage 4 chronic kidney disease, or unspecified chronic kidney disease: Secondary | ICD-10-CM | POA: Diagnosis present

## 2021-10-31 DIAGNOSIS — I10 Essential (primary) hypertension: Secondary | ICD-10-CM | POA: Diagnosis present

## 2021-10-31 DIAGNOSIS — E119 Type 2 diabetes mellitus without complications: Secondary | ICD-10-CM

## 2021-10-31 HISTORY — DX: Malignant (primary) neoplasm, unspecified: C80.1

## 2021-10-31 LAB — PROTIME-INR
INR: 1.2 (ref 0.8–1.2)
Prothrombin Time: 15.2 seconds (ref 11.4–15.2)

## 2021-10-31 LAB — URINALYSIS, ROUTINE W REFLEX MICROSCOPIC
Bacteria, UA: NONE SEEN
Bilirubin Urine: NEGATIVE
Glucose, UA: >=500 mg/dL — AB
Hgb urine dipstick: NEGATIVE
Ketones, ur: NEGATIVE mg/dL
Leukocytes,Ua: NEGATIVE
Nitrite: NEGATIVE
Protein, ur: NEGATIVE mg/dL
Specific Gravity, Urine: 1.024 (ref 1.005–1.030)
pH: 5 (ref 5.0–8.0)

## 2021-10-31 LAB — MRSA NEXT GEN BY PCR, NASAL: MRSA by PCR Next Gen: NOT DETECTED

## 2021-10-31 LAB — HEMOGLOBIN A1C
Hgb A1c MFr Bld: 9.1 % — ABNORMAL HIGH (ref 4.8–5.6)
Mean Plasma Glucose: 214.47 mg/dL

## 2021-10-31 LAB — CBC WITH DIFFERENTIAL/PLATELET
Abs Immature Granulocytes: 0.08 10*3/uL — ABNORMAL HIGH (ref 0.00–0.07)
Basophils Absolute: 0 10*3/uL (ref 0.0–0.1)
Basophils Relative: 0 %
Eosinophils Absolute: 0 10*3/uL (ref 0.0–0.5)
Eosinophils Relative: 0 %
HCT: 27.2 % — ABNORMAL LOW (ref 39.0–52.0)
Hemoglobin: 8.6 g/dL — ABNORMAL LOW (ref 13.0–17.0)
Immature Granulocytes: 1 %
Lymphocytes Relative: 12 %
Lymphs Abs: 1.1 10*3/uL (ref 0.7–4.0)
MCH: 27.1 pg (ref 26.0–34.0)
MCHC: 31.6 g/dL (ref 30.0–36.0)
MCV: 85.8 fL (ref 80.0–100.0)
Monocytes Absolute: 0.8 10*3/uL (ref 0.1–1.0)
Monocytes Relative: 9 %
Neutro Abs: 7.2 10*3/uL (ref 1.7–7.7)
Neutrophils Relative %: 78 %
Platelets: 338 10*3/uL (ref 150–400)
RBC: 3.17 MIL/uL — ABNORMAL LOW (ref 4.22–5.81)
RDW: 15.6 % — ABNORMAL HIGH (ref 11.5–15.5)
WBC: 9.3 10*3/uL (ref 4.0–10.5)
nRBC: 0 % (ref 0.0–0.2)

## 2021-10-31 LAB — CBC
HCT: 25 % — ABNORMAL LOW (ref 39.0–52.0)
Hemoglobin: 8.1 g/dL — ABNORMAL LOW (ref 13.0–17.0)
MCH: 26.9 pg (ref 26.0–34.0)
MCHC: 32.4 g/dL (ref 30.0–36.0)
MCV: 83.1 fL (ref 80.0–100.0)
Platelets: 317 10*3/uL (ref 150–400)
RBC: 3.01 MIL/uL — ABNORMAL LOW (ref 4.22–5.81)
RDW: 15.5 % (ref 11.5–15.5)
WBC: 9 10*3/uL (ref 4.0–10.5)
nRBC: 0 % (ref 0.0–0.2)

## 2021-10-31 LAB — COMPREHENSIVE METABOLIC PANEL
ALT: 21 U/L (ref 0–44)
AST: 46 U/L — ABNORMAL HIGH (ref 15–41)
Albumin: 2.6 g/dL — ABNORMAL LOW (ref 3.5–5.0)
Alkaline Phosphatase: 208 U/L — ABNORMAL HIGH (ref 38–126)
Anion gap: 11 (ref 5–15)
BUN: 21 mg/dL (ref 8–23)
CO2: 23 mmol/L (ref 22–32)
Calcium: 9.3 mg/dL (ref 8.9–10.3)
Chloride: 103 mmol/L (ref 98–111)
Creatinine, Ser: 1.32 mg/dL — ABNORMAL HIGH (ref 0.61–1.24)
GFR, Estimated: 59 mL/min — ABNORMAL LOW (ref >60)
Glucose, Bld: 213 mg/dL — ABNORMAL HIGH (ref 70–99)
Potassium: 4.3 mmol/L (ref 3.5–5.1)
Sodium: 137 mmol/L (ref 135–145)
Total Bilirubin: 0.5 mg/dL (ref 0.3–1.2)
Total Protein: 8.5 g/dL — ABNORMAL HIGH (ref 6.5–8.1)

## 2021-10-31 LAB — GLUCOSE, CAPILLARY: Glucose-Capillary: 118 mg/dL — ABNORMAL HIGH (ref 70–99)

## 2021-10-31 LAB — HIV ANTIBODY (ROUTINE TESTING W REFLEX): HIV Screen 4th Generation wRfx: NONREACTIVE

## 2021-10-31 LAB — POC OCCULT BLOOD, ED: Fecal Occult Bld: NEGATIVE

## 2021-10-31 LAB — HEPARIN LEVEL (UNFRACTIONATED): Heparin Unfractionated: 0.1 IU/mL — ABNORMAL LOW (ref 0.30–0.70)

## 2021-10-31 LAB — TROPONIN I (HIGH SENSITIVITY): Troponin I (High Sensitivity): 5 ng/L (ref <18)

## 2021-10-31 LAB — TYPE AND SCREEN
ABO/RH(D): A POS
Antibody Screen: NEGATIVE

## 2021-10-31 LAB — BRAIN NATRIURETIC PEPTIDE: B Natriuretic Peptide: 32.3 pg/mL (ref 0.0–100.0)

## 2021-10-31 LAB — TSH: TSH: 2.454 u[IU]/mL (ref 0.350–4.500)

## 2021-10-31 LAB — LIPASE, BLOOD: Lipase: 42 U/L (ref 11–51)

## 2021-10-31 LAB — ABO/RH: ABO/RH(D): A POS

## 2021-10-31 MED ORDER — INSULIN GLARGINE-YFGN 100 UNIT/ML ~~LOC~~ SOLN
15.0000 [IU] | Freq: Every day | SUBCUTANEOUS | Status: DC
  Administered 2021-11-01 – 2021-11-04 (×4): 15 [IU] via SUBCUTANEOUS
  Filled 2021-10-31 (×4): qty 0.15

## 2021-10-31 MED ORDER — EMPAGLIFLOZIN 25 MG PO TABS
25.0000 mg | ORAL_TABLET | Freq: Every day | ORAL | Status: DC
  Administered 2021-11-01 – 2021-11-04 (×4): 25 mg via ORAL
  Filled 2021-10-31 (×5): qty 1

## 2021-10-31 MED ORDER — SODIUM CHLORIDE 0.9 % IV BOLUS
1000.0000 mL | Freq: Once | INTRAVENOUS | Status: AC
  Administered 2021-10-31: 1000 mL via INTRAVENOUS

## 2021-10-31 MED ORDER — LISINOPRIL 20 MG PO TABS
40.0000 mg | ORAL_TABLET | Freq: Every day | ORAL | Status: DC
  Administered 2021-11-01 – 2021-11-04 (×4): 40 mg via ORAL
  Filled 2021-10-31 (×4): qty 2

## 2021-10-31 MED ORDER — ORAL CARE MOUTH RINSE: 15.0000 mL | OROMUCOSAL | Status: DC | PRN

## 2021-10-31 MED ORDER — OXYCODONE HCL 5 MG PO TABS
5.0000 mg | ORAL_TABLET | ORAL | Status: DC | PRN
  Administered 2021-11-01 – 2021-11-04 (×7): 5 mg via ORAL
  Filled 2021-10-31 (×7): qty 1

## 2021-10-31 MED ORDER — HEPARIN (PORCINE) 25000 UT/250ML-% IV SOLN
1700.0000 [IU]/h | INTRAVENOUS | Status: DC
  Administered 2021-10-31: 1250 [IU]/h via INTRAVENOUS
  Administered 2021-11-01: 1700 [IU]/h via INTRAVENOUS
  Filled 2021-10-31 (×2): qty 250

## 2021-10-31 MED ORDER — AMLODIPINE BESYLATE 10 MG PO TABS
10.0000 mg | ORAL_TABLET | Freq: Every day | ORAL | Status: DC
  Administered 2021-11-01 – 2021-11-04 (×4): 10 mg via ORAL
  Filled 2021-10-31 (×4): qty 1

## 2021-10-31 MED ORDER — CHLORHEXIDINE GLUCONATE CLOTH 2 % EX PADS
6.0000 | MEDICATED_PAD | Freq: Every day | CUTANEOUS | Status: DC
  Administered 2021-11-01 – 2021-11-02 (×2): 6 via TOPICAL

## 2021-10-31 MED ORDER — HEPARIN BOLUS VIA INFUSION
4000.0000 [IU] | Freq: Once | INTRAVENOUS | Status: AC
Start: 2021-10-31 — End: 2021-10-31
  Administered 2021-10-31: 4000 [IU] via INTRAVENOUS
  Filled 2021-10-31: qty 4000

## 2021-10-31 MED ORDER — IOHEXOL 350 MG/ML SOLN
60.0000 mL | Freq: Once | INTRAVENOUS | Status: AC | PRN
  Administered 2021-10-31: 60 mL via INTRAVENOUS

## 2021-10-31 MED ORDER — HEPARIN BOLUS VIA INFUSION
2000.0000 [IU] | Freq: Once | INTRAVENOUS | Status: AC
  Administered 2021-11-01: 2000 [IU] via INTRAVENOUS
  Filled 2021-10-31: qty 2000

## 2021-10-31 MED ORDER — SODIUM CHLORIDE 0.9% FLUSH: 3.0000 mL | INTRAVENOUS | Status: DC | PRN

## 2021-10-31 MED ORDER — PANTOPRAZOLE SODIUM 40 MG IV SOLR
40.0000 mg | Freq: Two times a day (BID) | INTRAVENOUS | Status: DC
  Administered 2021-10-31 – 2021-11-01 (×3): 40 mg via INTRAVENOUS
  Filled 2021-10-31 (×3): qty 10

## 2021-10-31 MED ORDER — INSULIN ASPART 100 UNIT/ML IJ SOLN
0.0000 [IU] | Freq: Three times a day (TID) | INTRAMUSCULAR | Status: DC
  Administered 2021-11-01: 1 [IU] via SUBCUTANEOUS
  Administered 2021-11-02: 3 [IU] via SUBCUTANEOUS
  Administered 2021-11-03: 2 [IU] via SUBCUTANEOUS

## 2021-10-31 MED ORDER — SODIUM CHLORIDE 0.9% FLUSH
3.0000 mL | Freq: Two times a day (BID) | INTRAVENOUS | Status: DC
  Administered 2021-10-31 – 2021-11-03 (×6): 3 mL via INTRAVENOUS

## 2021-10-31 MED ORDER — TRAMADOL HCL 50 MG PO TABS
50.0000 mg | ORAL_TABLET | Freq: Three times a day (TID) | ORAL | Status: DC | PRN
  Administered 2021-11-01 – 2021-11-03 (×4): 50 mg via ORAL
  Filled 2021-10-31 (×5): qty 1

## 2021-10-31 MED ORDER — HYDRALAZINE HCL 20 MG/ML IJ SOLN: 10.0000 mg | Freq: Three times a day (TID) | INTRAMUSCULAR | Status: DC | PRN

## 2021-10-31 MED ORDER — SODIUM CHLORIDE 0.9 % IV SOLN: 250.0000 mL | INTRAVENOUS | Status: DC | PRN

## 2021-10-31 MED ORDER — IOHEXOL 300 MG/ML  SOLN
100.0000 mL | Freq: Once | INTRAMUSCULAR | Status: AC | PRN
  Administered 2021-10-31: 80 mL via INTRAVENOUS

## 2021-10-31 NOTE — Assessment & Plan Note (Addendum)
Continue Protonix °

## 2021-10-31 NOTE — Progress Notes (Signed)
ANTICOAGULATION CONSULT NOTE - Follow Up Consult  Pharmacy Consult for heparin gtt  Indication: portal vein thrombosis  No Known Allergies  Patient Measurements: Height: '5\' 10"'$  (177.8 cm) Weight: 69 kg (152 lb 1.9 oz) IBW/kg (Calculated) : 73 Heparin Dosing Weight: 69 Kg  Vital Signs: Temp: 98.9 F (37.2 C) (06/22 2300) Temp Source: Oral (06/22 2300) BP: 130/66 (06/22 2300) Pulse Rate: 94 (06/22 2300)  Labs: Recent Labs    10/31/21 1243 10/31/21 2233  HGB 8.6* 8.1*  HCT 27.2* 25.0*  PLT 338 317  LABPROT  --  15.2  INR  --  1.2  HEPARINUNFRC  --  0.10*  CREATININE 1.32*  --   TROPONINIHS 5  --     Estimated Creatinine Clearance: 52.3 mL/min (A) (by C-G formula based on SCr of 1.32 mg/dL (H)).   Medications:  Medications Prior to Admission  Medication Sig Dispense Refill Last Dose   acetaminophen (TYLENOL) 500 MG tablet Take 500 mg by mouth every 6 (six) hours as needed for moderate pain.   10/30/2021   amLODipine (NORVASC) 10 MG tablet Take 10 mg by mouth daily.   10/31/2021   carboxymethylcellulose (REFRESH PLUS) 0.5 % SOLN 1 drop 4 (four) times daily as needed (dry eyes).   10/30/2021   Cholecalciferol 50 MCG (2000 UT) TABS Take 4,000 Units by mouth every Monday, Wednesday, and Friday.   10/30/2021   empagliflozin (JARDIANCE) 25 MG TABS tablet Take 25 mg by mouth daily.   10/31/2021   hydrocortisone 1 % ointment Apply 1 Application topically 2 (two) times daily as needed for itching.   Past Month   ibuprofen (ADVIL) 200 MG tablet Take 200 mg by mouth every 8 (eight) hours as needed for moderate pain.   10/29/2021   insulin glargine (LANTUS) 100 UNIT/ML injection Inject 35 Units into the skin daily.   10/31/2021   lisinopril (PRINIVIL,ZESTRIL) 40 MG tablet Take 40 mg by mouth daily.   10/31/2021   metFORMIN (GLUCOPHAGE) 850 MG tablet Take 850 mg by mouth daily.   10/31/2021   NON FORMULARY Place 1 application  into both eyes at bedtime. Ocular lubricant ointment       pantoprazole (PROTONIX) 40 MG tablet Take 40 mg by mouth daily.   10/31/2021   sildenafil (VIAGRA) 100 MG tablet Take 100 mg by mouth daily as needed for erectile dysfunction.   unk last dose    Assessment: Troy [name] a 66 y.o. male presented with portal vein thrombosis. Pharmacy has been consulted for heparin dosing.    Anticoagulation PTA: Patient not on anticoagulation PTA.  Heparin Level below goal: 0.10, no issues with infusion or overt bleeding per RN, Hgb 8.1, PLT WNL  Goal of Therapy:  Heparin level 0.3-0.7 units/ml Monitor platelets by anticoagulation protocol: Yes   Plan:  Heparin bolus via infusion 2,000 Units x1  Increase heparin infusion (~3.5 units.kg/hr) to 1,500 units/hour Check heparin level in 6 hours and daily while on heparin, daily CBC  Monitor for signs and symptoms of bleeding   Georga Bora, PharmD Clinical Pharmacist 10/31/2021 11:38 PM Please check AMION for all [place] numbers

## 2021-10-31 NOTE — Assessment & Plan Note (Signed)
A1C from 01/2021 was 8.9, update now Sensitive SSI while liquid diet Decrease his long acting by 50% accuchecks QAC/HS Hold metformin

## 2021-10-31 NOTE — Assessment & Plan Note (Signed)
UDS pending, but states he has not used in over a year

## 2021-10-31 NOTE — Assessment & Plan Note (Addendum)
Hold statin for now with liver w/u  No transaminitis

## 2021-10-31 NOTE — Assessment & Plan Note (Addendum)
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
## 2021-10-31 NOTE — Assessment & Plan Note (Addendum)
Hgb was 11.2 in May of 2023 and now 8.6 today Fecal occult negative, will repeat BUN not elevated, but does have epigastric pain in setting of liver mass, known cirrhosis and portal HTN. With unknown chronicity of the PVT, concern for esophageal varice and bleeding risk. Discussed with GI, okay with heparin for now.  Clear liquid diet, PPI IV BID Serial CBC q 6 hours while on heparin for portal vein thrombosis. Close monitoring  Does have past history of gastritis. EGD/colonoscopy records can not be found from [place]  ? If from malignancy vs. Bleed  If hgb drops, will need to stop heparin and transfuse if <7

## 2021-10-31 NOTE — ED Provider Triage Note (Addendum)
Emergency Medicine Provider Triage Evaluation Note  Troy [name] , a 66 y.o. male  was evaluated in triage.  Seen at [facility] earlier today and sent to the emergency department for low hemoglobin.  Pt complains of lower abdominal pain and scrotal swelling over the past several weeks.  No dysuria or hematuria.  No blood in the stool.  No lightheadedness or syncope.  Hgb 8.8 at [facility], WBC 8.9, also elevated blood sugar 270, creatinine 1.4, UA not infection.   Review of Systems  Positive: Scrotal pain/swelling Negative: Vomiting, hematochezia  Physical Exam  BP (!) 154/77 (BP Location: Right Arm)   Pulse (!) 114   Temp 98.5 F (36.9 C) (Oral)   Resp 20   SpO2 100%  Gen:   Awake, no distress   Resp:  Normal effort  MSK:   Moves extremities without difficulty  Other:  Mildly pale conjunctiva, tenderness right hemiscrotum with mild edema  Medical Decision Making  Medically screening exam initiated at 12:34 PM.  Appropriate orders placed.  Demitris Pokorny was informed that the remainder of the evaluation will be completed by another provider, this initial triage assessment does not replace that evaluation, and the importance of remaining in the ED until their evaluation is complete.      Carlisle Cater, PA-C 10/31/21 1242

## 2021-10-31 NOTE — Assessment & Plan Note (Addendum)
66 year old male presenting with acute on chronic anemia, 2+ month history of abdominal pain, 25 pound weight loss, fatigue and dypsnea on exertion found to have large ill-defined hepatic mass in left lobe and caudate lobe worrisome for primary hepatocellular carcinoma with additional ill defined masses in left lobe of liver worrisome for metastatic disease in setting of cirrhosis. Also has left and right vein portal thrombosis with acute on chronic anemia.  -admit to progressive -GI has been consulted and will see tomorrow -started on heparin gtt for portal vein thrombosis, fecal occult negative. Mesenteric vein patent.  Will need close monitoring for bleeding. See #2. Unknown time of PVT in setting of cirrhosis/portal HTN increases risk of esophageal varices/bleed. Will discuss with Gi tonight. Okay with heparin.  -check AFP tumor marker -known history of hep C cirrhosis s/p treatment.  -possible compression/invasion of the IVC

## 2021-10-31 NOTE — ED Triage Notes (Signed)
Patient sent to ED from [facility] for evaluation of hemoglobin of 8.8 and scrotal swelling that started five days ago. Patient denies known bleeding. Patient is alert, oriented, and in no apparent distress at this time.

## 2021-10-31 NOTE — Assessment & Plan Note (Addendum)
Hypertensive, will continue home medication and add on IV parameters PRN  Continue lisinopril, follow renal function

## 2021-10-31 NOTE — Progress Notes (Signed)
Pharmacy Consult for heparin gtt  Indication: portal vein thrombosis  No Known Allergies  Patient Measurements:   Heparin Dosing Weight:   Wt used- 79 kg (via flowsheets)   Vital Signs: Temp: 98.9 F (37.2 C) (06/22 1358) Temp Source: Oral (06/22 1358) BP: 161/82 (06/22 1615) Pulse Rate: 96 (06/22 1615)  Labs: Recent Labs    10/31/21 1243  HGB 8.6*  HCT 27.2*  PLT 338  CREATININE 1.32*    CrCl cannot be calculated (Unknown ideal weight.).  Assessment: Troy [name] a 66 y.o. male presented with portal vein thrombosis. Pharmacy has been consulted for heparin dosing.   Anticoagulation PTA: Patient not on anticoagulation PTA.  Goal of Therapy:  Heparin level 0.3-0.7 units/ml Monitor platelets by anticoagulation protocol: Yes   Plan:  Heparin bolus via infusion 4,000 Units x1  Start heparin infusion at 1,250 units/hour Check heparin level in 6 hours and daily while on heparin, daily CBC  Monitor for signs and symptoms of bleeding    Adria Dill, PharmD PGY-1 Acute Care Resident  10/31/2021 4:53 PM

## 2021-10-31 NOTE — ED Notes (Signed)
Lab called to add on labs
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
## 2021-11-01 ENCOUNTER — Other Ambulatory Visit (HOSPITAL_COMMUNITY): Payer: Self-pay

## 2021-11-01 DIAGNOSIS — I81 Portal vein thrombosis: Secondary | ICD-10-CM | POA: Diagnosis not present

## 2021-11-01 DIAGNOSIS — E440 Moderate protein-calorie malnutrition: Secondary | ICD-10-CM | POA: Insufficient documentation

## 2021-11-01 DIAGNOSIS — D62 Acute posthemorrhagic anemia: Secondary | ICD-10-CM | POA: Diagnosis not present

## 2021-11-01 DIAGNOSIS — F1410 Cocaine abuse, uncomplicated: Secondary | ICD-10-CM | POA: Diagnosis not present

## 2021-11-01 DIAGNOSIS — N1832 Chronic kidney disease, stage 3b: Secondary | ICD-10-CM

## 2021-11-01 DIAGNOSIS — R160 Hepatomegaly, not elsewhere classified: Secondary | ICD-10-CM | POA: Diagnosis not present

## 2021-11-01 LAB — CBC
HCT: 23.5 % — ABNORMAL LOW (ref 39.0–52.0)
HCT: 25.2 % — ABNORMAL LOW (ref 39.0–52.0)
Hemoglobin: 7.4 g/dL — ABNORMAL LOW (ref 13.0–17.0)
Hemoglobin: 8.2 g/dL — ABNORMAL LOW (ref 13.0–17.0)
MCH: 26.3 pg (ref 26.0–34.0)
MCH: 26.9 pg (ref 26.0–34.0)
MCHC: 31.5 g/dL (ref 30.0–36.0)
MCHC: 32.5 g/dL (ref 30.0–36.0)
MCV: 83.6 fL (ref 80.0–100.0)
MCV: 82.6 fL (ref 80.0–100.0)
Platelets: 281 10*3/uL (ref 150–400)
Platelets: 317 10*3/uL (ref 150–400)
RBC: 2.81 MIL/uL — ABNORMAL LOW (ref 4.22–5.81)
RBC: 3.05 MIL/uL — ABNORMAL LOW (ref 4.22–5.81)
RDW: 15.6 % — ABNORMAL HIGH (ref 11.5–15.5)
RDW: 15.6 % — ABNORMAL HIGH (ref 11.5–15.5)
WBC: 8.3 10*3/uL (ref 4.0–10.5)
WBC: 8.3 10*3/uL (ref 4.0–10.5)
nRBC: 0 % (ref 0.0–0.2)
nRBC: 0 % (ref 0.0–0.2)

## 2021-11-01 LAB — COMPREHENSIVE METABOLIC PANEL
ALT: 17 U/L (ref 0–44)
AST: 40 U/L (ref 15–41)
Albumin: 2.1 g/dL — ABNORMAL LOW (ref 3.5–5.0)
Alkaline Phosphatase: 175 U/L — ABNORMAL HIGH (ref 38–126)
Anion gap: 12 (ref 5–15)
BUN: 15 mg/dL (ref 8–23)
CO2: 22 mmol/L (ref 22–32)
Calcium: 8.9 mg/dL (ref 8.9–10.3)
Chloride: 103 mmol/L (ref 98–111)
Creatinine, Ser: 1.19 mg/dL (ref 0.61–1.24)
GFR, Estimated: >60 mL/min (ref >60)
Glucose, Bld: 162 mg/dL — ABNORMAL HIGH (ref 70–99)
Potassium: 4.1 mmol/L (ref 3.5–5.1)
Sodium: 137 mmol/L (ref 135–145)
Total Bilirubin: 0.7 mg/dL (ref 0.3–1.2)
Total Protein: 7.1 g/dL (ref 6.5–8.1)

## 2021-11-01 LAB — HEPARIN LEVEL (UNFRACTIONATED): Heparin Unfractionated: 0.2 IU/mL — ABNORMAL LOW (ref 0.30–0.70)

## 2021-11-01 LAB — IRON AND TIBC
Iron: 21 ug/dL — ABNORMAL LOW (ref 45–182)
Saturation Ratios: 9 % — ABNORMAL LOW (ref 17.9–39.5)
TIBC: 245 ug/dL — ABNORMAL LOW (ref 250–450)
UIBC: 224 ug/dL

## 2021-11-01 LAB — FOLATE: Folate: 15.7 ng/mL (ref >5.9)

## 2021-11-01 LAB — FERRITIN: Ferritin: 172 ng/mL (ref 24–336)

## 2021-11-01 LAB — GLUCOSE, CAPILLARY
Glucose-Capillary: 100 mg/dL — ABNORMAL HIGH (ref 70–99)
Glucose-Capillary: 112 mg/dL — ABNORMAL HIGH (ref 70–99)
Glucose-Capillary: 147 mg/dL — ABNORMAL HIGH (ref 70–99)
Glucose-Capillary: 162 mg/dL — ABNORMAL HIGH (ref 70–99)

## 2021-11-01 LAB — VITAMIN B12: Vitamin B-12: 516 pg/mL (ref 180–914)

## 2021-11-01 MED ORDER — HEPARIN BOLUS VIA INFUSION
1000.0000 [IU] | Freq: Once | INTRAVENOUS | Status: AC
Start: 2021-11-01 — End: 2021-11-01
  Administered 2021-11-01: 1000 [IU] via INTRAVENOUS
  Filled 2021-11-01: qty 1000

## 2021-11-01 MED ORDER — ENSURE ENLIVE PO LIQD
237.0000 mL | Freq: Three times a day (TID) | ORAL | Status: DC
  Administered 2021-11-01 – 2021-11-04 (×7): 237 mL via ORAL
  Filled 2021-11-01 (×2): qty 237

## 2021-11-01 MED ORDER — LOPERAMIDE HCL 2 MG PO CAPS
2.0000 mg | ORAL_CAPSULE | ORAL | Status: DC | PRN
Start: 2021-11-01 — End: 2021-11-04
  Administered 2021-11-02 – 2021-11-03 (×2): 2 mg via ORAL
  Filled 2021-11-01 (×2): qty 1

## 2021-11-01 MED ORDER — RENA-VITE PO TABS
1.0000 | ORAL_TABLET | Freq: Every day | ORAL | Status: DC
  Administered 2021-11-01 – 2021-11-03 (×3): 1 via ORAL
  Filled 2021-11-01 (×3): qty 1

## 2021-11-01 NOTE — TOC Progression Note (Addendum)
Transition of Care [facility]) - Progression Note    Patient Details  Name: Troy [name] MRN: [id number] Date of Birth: 1955/10/13  Transition of Care [facility] CM/SW Contact  Beckie Busing, RN Phone Number:[phone]  11/01/2021, 3:39 PM  Clinical Narrative:     Transition of Care Department [facility]) has reviewed patient and no TOC needs have been identified at this time. We will continue to monitor patient advancement through interdisciplinary progression rounds  Patient [facility] information confirmed - Patient PCP is at [facility] Dr. Jeri Lager, Social worker Frazier Richards [phone] Ext [phone]        Expected Discharge Plan and Services                                                 Social Determinants of Health (SDOH) Interventions    Readmission Risk Interventions     No data to display

## 2021-11-02 ENCOUNTER — Encounter (HOSPITAL_COMMUNITY): Payer: Self-pay | Admitting: Family Medicine

## 2021-11-02 ENCOUNTER — Inpatient Hospital Stay (HOSPITAL_COMMUNITY): Payer: No Typology Code available for payment source | Admitting: Certified Registered Nurse Anesthetist

## 2021-11-02 ENCOUNTER — Encounter (HOSPITAL_COMMUNITY)
Admission: EM | Disposition: A | Discharge status: Home / Self Care | Payer: Self-pay | Source: Home / Self Care | Attending: Internal Medicine

## 2021-11-02 DIAGNOSIS — B182 Chronic viral hepatitis C: Secondary | ICD-10-CM

## 2021-11-02 DIAGNOSIS — I8510 Secondary esophageal varices without bleeding: Secondary | ICD-10-CM

## 2021-11-02 DIAGNOSIS — K7460 Unspecified cirrhosis of liver: Secondary | ICD-10-CM

## 2021-11-02 DIAGNOSIS — K766 Portal hypertension: Secondary | ICD-10-CM

## 2021-11-02 DIAGNOSIS — D62 Acute posthemorrhagic anemia: Secondary | ICD-10-CM | POA: Diagnosis not present

## 2021-11-02 DIAGNOSIS — K3189 Other diseases of stomach and duodenum: Secondary | ICD-10-CM | POA: Diagnosis not present

## 2021-11-02 DIAGNOSIS — K229 Disease of esophagus, unspecified: Secondary | ICD-10-CM

## 2021-11-02 DIAGNOSIS — B3781 Candidal esophagitis: Secondary | ICD-10-CM

## 2021-11-02 DIAGNOSIS — I81 Portal vein thrombosis: Secondary | ICD-10-CM | POA: Diagnosis not present

## 2021-11-02 HISTORY — PX: BIOPSY: SHX5522

## 2021-11-02 HISTORY — PX: ESOPHAGOGASTRODUODENOSCOPY: SHX5428

## 2021-11-02 LAB — COMPREHENSIVE METABOLIC PANEL
ALT: 18 U/L (ref 0–44)
AST: 39 U/L (ref 15–41)
Albumin: 2.1 g/dL — ABNORMAL LOW (ref 3.5–5.0)
Alkaline Phosphatase: 176 U/L — ABNORMAL HIGH (ref 38–126)
Anion gap: 8 (ref 5–15)
BUN: 15 mg/dL (ref 8–23)
CO2: 26 mmol/L (ref 22–32)
Calcium: 9 mg/dL (ref 8.9–10.3)
Chloride: 103 mmol/L (ref 98–111)
Creatinine, Ser: 1.26 mg/dL — ABNORMAL HIGH (ref 0.61–1.24)
GFR, Estimated: >60 mL/min (ref >60)
Glucose, Bld: 188 mg/dL — ABNORMAL HIGH (ref 70–99)
Potassium: 4.5 mmol/L (ref 3.5–5.1)
Sodium: 137 mmol/L (ref 135–145)
Total Bilirubin: 0.4 mg/dL (ref 0.3–1.2)
Total Protein: 6.8 g/dL (ref 6.5–8.1)

## 2021-11-02 LAB — CBC WITH DIFFERENTIAL/PLATELET
Abs Immature Granulocytes: 0.04 10*3/uL (ref 0.00–0.07)
Basophils Absolute: 0 10*3/uL (ref 0.0–0.1)
Basophils Relative: 0 %
Eosinophils Absolute: 0.1 10*3/uL (ref 0.0–0.5)
Eosinophils Relative: 1 %
HCT: 23.8 % — ABNORMAL LOW (ref 39.0–52.0)
Hemoglobin: 7.5 g/dL — ABNORMAL LOW (ref 13.0–17.0)
Immature Granulocytes: 1 %
Lymphocytes Relative: 16 %
Lymphs Abs: 1.1 10*3/uL (ref 0.7–4.0)
MCH: 26.6 pg (ref 26.0–34.0)
MCHC: 31.5 g/dL (ref 30.0–36.0)
MCV: 84.4 fL (ref 80.0–100.0)
Monocytes Absolute: 0.8 10*3/uL (ref 0.1–1.0)
Monocytes Relative: 12 %
Neutro Abs: 4.9 10*3/uL (ref 1.7–7.7)
Neutrophils Relative %: 70 %
Platelets: 275 10*3/uL (ref 150–400)
RBC: 2.82 MIL/uL — ABNORMAL LOW (ref 4.22–5.81)
RDW: 15.5 % (ref 11.5–15.5)
WBC: 6.9 10*3/uL (ref 4.0–10.5)
nRBC: 0 % (ref 0.0–0.2)

## 2021-11-02 LAB — GLUCOSE, CAPILLARY
Glucose-Capillary: 112 mg/dL — ABNORMAL HIGH (ref 70–99)
Glucose-Capillary: 242 mg/dL — ABNORMAL HIGH (ref 70–99)
Glucose-Capillary: 163 mg/dL — ABNORMAL HIGH (ref 70–99)

## 2021-11-02 LAB — AFP TUMOR MARKER: AFP, Serum, Tumor Marker: 6.3 ng/mL (ref 0.0–8.4)

## 2021-11-02 LAB — MAGNESIUM: Magnesium: 1.7 mg/dL (ref 1.7–2.4)

## 2021-11-02 SURGERY — EGD (ESOPHAGOGASTRODUODENOSCOPY)
Anesthesia: Monitor Anesthesia Care | Laterality: Left

## 2021-11-02 MED ORDER — FLUCONAZOLE 200 MG PO TABS
200.0000 mg | ORAL_TABLET | Freq: Every day | ORAL | Status: DC
  Administered 2021-11-02 – 2021-11-04 (×3): 200 mg via ORAL
  Filled 2021-11-02 (×3): qty 1

## 2021-11-02 MED ORDER — PROPOFOL 10 MG/ML IV BOLUS
INTRAVENOUS | Status: DC | PRN
  Administered 2021-11-02: 20 mg via INTRAVENOUS
  Administered 2021-11-02 (×2): 30 mg via INTRAVENOUS
  Administered 2021-11-02: 40 mg via INTRAVENOUS

## 2021-11-02 MED ORDER — SODIUM CHLORIDE 0.9 % IV SOLN: INTRAVENOUS | Status: DC

## 2021-11-02 MED ORDER — PROPOFOL 500 MG/50ML IV EMUL
INTRAVENOUS | Status: DC | PRN
  Administered 2021-11-02: 100 ug/kg/min via INTRAVENOUS

## 2021-11-02 MED ORDER — PANTOPRAZOLE SODIUM 40 MG PO TBEC
40.0000 mg | DELAYED_RELEASE_TABLET | Freq: Every day | ORAL | Status: DC
  Administered 2021-11-02 – 2021-11-04 (×3): 40 mg via ORAL
  Filled 2021-11-02 (×3): qty 1

## 2021-11-02 MED ORDER — LACTATED RINGERS IV SOLN
INTRAVENOUS | Status: AC | PRN
  Administered 2021-11-02: 20 mL/h via INTRAVENOUS

## 2021-11-02 MED ORDER — LIDOCAINE 2% (20 MG/ML) 5 ML SYRINGE
INTRAMUSCULAR | Status: DC | PRN
  Administered 2021-11-02: 60 mg via INTRAVENOUS
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
## 2021-11-02 NOTE — Assessment & Plan Note (Signed)
-   Noted on EGD on 11/02/2021 - Started on 3-week course of fluconazole - HIV negative on admission

## 2021-11-02 NOTE — Anesthesia Procedure Notes (Signed)
Procedure Name: MAC Date/Time: 11/02/2021 11:45 AM  Performed by: Drema Pry, CRNAPre-anesthesia Checklist: Patient identified, Emergency Drugs available, Suction available and Patient being monitored Patient Re-evaluated:Patient Re-evaluated prior to induction Oxygen Delivery Method: Nasal cannula

## 2021-11-02 NOTE — Plan of Care (Signed)
  Problem: Education: Goal: Knowledge of General Education information will improve Description: Including pain rating scale, medication(s)/side effects and non-pharmacologic comfort measures Outcome: Not Progressing   Problem: Health Behavior/Discharge Planning: Goal: Ability to manage health-related needs will improve Outcome: Not Progressing   Problem: Clinical Measurements: Goal: Ability to maintain clinical measurements within normal limits will improve Outcome: Not Progressing   Problem: Activity: Goal: Risk for activity intolerance will decrease Outcome: Not Progressing   Problem: Nutrition: Goal: Adequate nutrition will be maintained Outcome: Not Progressing   Problem: Coping: Goal: Level of anxiety will decrease Outcome: Not Progressing   Problem: Elimination: Goal: Will not experience complications related to bowel motility Outcome: Not Progressing   Problem: Pain Managment: Goal: General experience of comfort will improve Outcome: Not Progressing   Problem: Clinical Measurements: Goal: Diagnostic test results will improve Outcome: Progressing Goal: Respiratory complications will improve Outcome: Progressing Goal: Cardiovascular complication will be avoided Outcome: Progressing   Problem: Elimination: Goal: Will not experience complications related to urinary retention Outcome: Progressing   Problem: Safety: Goal: Ability to remain free from injury will improve Outcome: Progressing   Problem: Skin Integrity: Goal: Risk for impaired skin integrity will decrease Outcome: Progressing   Problem: Coping: Goal: Ability to adjust to condition or change in health will improve Outcome: Progressing   Problem: Fluid Volume: Goal: Ability to maintain a balanced intake and output will improve Outcome: Progressing

## 2021-11-03 DIAGNOSIS — D62 Acute posthemorrhagic anemia: Secondary | ICD-10-CM | POA: Diagnosis not present

## 2021-11-03 DIAGNOSIS — K219 Gastro-esophageal reflux disease without esophagitis: Secondary | ICD-10-CM | POA: Diagnosis not present

## 2021-11-03 DIAGNOSIS — B3781 Candidal esophagitis: Secondary | ICD-10-CM | POA: Diagnosis not present

## 2021-11-03 DIAGNOSIS — I8510 Secondary esophageal varices without bleeding: Secondary | ICD-10-CM | POA: Diagnosis not present

## 2021-11-03 DIAGNOSIS — I81 Portal vein thrombosis: Secondary | ICD-10-CM | POA: Diagnosis not present

## 2021-11-03 DIAGNOSIS — F1410 Cocaine abuse, uncomplicated: Secondary | ICD-10-CM | POA: Diagnosis not present

## 2021-11-03 LAB — COMPREHENSIVE METABOLIC PANEL
ALT: 17 U/L (ref 0–44)
AST: 39 U/L (ref 15–41)
Albumin: 2.2 g/dL — ABNORMAL LOW (ref 3.5–5.0)
Alkaline Phosphatase: 165 U/L — ABNORMAL HIGH (ref 38–126)
Anion gap: 7 (ref 5–15)
BUN: 17 mg/dL (ref 8–23)
CO2: 24 mmol/L (ref 22–32)
Calcium: 9 mg/dL (ref 8.9–10.3)
Chloride: 106 mmol/L (ref 98–111)
Creatinine, Ser: 1.2 mg/dL (ref 0.61–1.24)
GFR, Estimated: >60 mL/min (ref >60)
Glucose, Bld: 221 mg/dL — ABNORMAL HIGH (ref 70–99)
Potassium: 4.3 mmol/L (ref 3.5–5.1)
Sodium: 137 mmol/L (ref 135–145)
Total Bilirubin: 0.2 mg/dL — ABNORMAL LOW (ref 0.3–1.2)
Total Protein: 7 g/dL (ref 6.5–8.1)

## 2021-11-03 LAB — GLUCOSE, CAPILLARY
Glucose-Capillary: 174 mg/dL — ABNORMAL HIGH (ref 70–99)
Glucose-Capillary: 119 mg/dL — ABNORMAL HIGH (ref 70–99)
Glucose-Capillary: 184 mg/dL — ABNORMAL HIGH (ref 70–99)
Glucose-Capillary: 147 mg/dL — ABNORMAL HIGH (ref 70–99)

## 2021-11-03 LAB — CBC WITH DIFFERENTIAL/PLATELET
Abs Immature Granulocytes: 0.05 10*3/uL (ref 0.00–0.07)
Basophils Absolute: 0 10*3/uL (ref 0.0–0.1)
Basophils Relative: 0 %
Eosinophils Absolute: 0.1 10*3/uL (ref 0.0–0.5)
Eosinophils Relative: 1 %
HCT: 23.4 % — ABNORMAL LOW (ref 39.0–52.0)
Hemoglobin: 7.7 g/dL — ABNORMAL LOW (ref 13.0–17.0)
Immature Granulocytes: 1 %
Lymphocytes Relative: 13 %
Lymphs Abs: 0.9 10*3/uL (ref 0.7–4.0)
MCH: 27.7 pg (ref 26.0–34.0)
MCHC: 32.9 g/dL (ref 30.0–36.0)
MCV: 84.2 fL (ref 80.0–100.0)
Monocytes Absolute: 0.7 10*3/uL (ref 0.1–1.0)
Monocytes Relative: 9 %
Neutro Abs: 5.6 10*3/uL (ref 1.7–7.7)
Neutrophils Relative %: 76 %
Platelets: 281 10*3/uL (ref 150–400)
RBC: 2.78 MIL/uL — ABNORMAL LOW (ref 4.22–5.81)
RDW: 15.3 % (ref 11.5–15.5)
WBC: 7.4 10*3/uL (ref 4.0–10.5)
nRBC: 0 % (ref 0.0–0.2)

## 2021-11-03 LAB — MAGNESIUM: Magnesium: 1.8 mg/dL (ref 1.7–2.4)
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
## 2021-11-04 ENCOUNTER — Inpatient Hospital Stay (HOSPITAL_COMMUNITY): Payer: No Typology Code available for payment source

## 2021-11-04 ENCOUNTER — Encounter (HOSPITAL_COMMUNITY): Payer: Self-pay | Admitting: Gastroenterology

## 2021-11-04 DIAGNOSIS — N433 Hydrocele, unspecified: Secondary | ICD-10-CM

## 2021-11-04 DIAGNOSIS — R160 Hepatomegaly, not elsewhere classified: Secondary | ICD-10-CM | POA: Diagnosis not present

## 2021-11-04 DIAGNOSIS — I81 Portal vein thrombosis: Secondary | ICD-10-CM | POA: Diagnosis not present

## 2021-11-04 DIAGNOSIS — B3781 Candidal esophagitis: Secondary | ICD-10-CM | POA: Diagnosis not present

## 2021-11-04 LAB — CBC WITH DIFFERENTIAL/PLATELET
Abs Immature Granulocytes: 0.06 10*3/uL (ref 0.00–0.07)
Basophils Absolute: 0 10*3/uL (ref 0.0–0.1)
Basophils Relative: 1 %
Eosinophils Absolute: 0.1 10*3/uL (ref 0.0–0.5)
Eosinophils Relative: 1 %
HCT: 24.9 % — ABNORMAL LOW (ref 39.0–52.0)
Hemoglobin: 8 g/dL — ABNORMAL LOW (ref 13.0–17.0)
Immature Granulocytes: 1 %
Lymphocytes Relative: 17 %
Lymphs Abs: 1.2 10*3/uL (ref 0.7–4.0)
MCH: 27.3 pg (ref 26.0–34.0)
MCHC: 32.1 g/dL (ref 30.0–36.0)
MCV: 85 fL (ref 80.0–100.0)
Monocytes Absolute: 0.7 10*3/uL (ref 0.1–1.0)
Monocytes Relative: 10 %
Neutro Abs: 5.2 10*3/uL (ref 1.7–7.7)
Neutrophils Relative %: 70 %
Platelets: 300 10*3/uL (ref 150–400)
RBC: 2.93 MIL/uL — ABNORMAL LOW (ref 4.22–5.81)
RDW: 15.5 % (ref 11.5–15.5)
WBC: 7.4 10*3/uL (ref 4.0–10.5)
nRBC: 0 % (ref 0.0–0.2)

## 2021-11-04 LAB — COMPREHENSIVE METABOLIC PANEL
ALT: 16 U/L (ref 0–44)
AST: 40 U/L (ref 15–41)
Albumin: 2.3 g/dL — ABNORMAL LOW (ref 3.5–5.0)
Alkaline Phosphatase: 171 U/L — ABNORMAL HIGH (ref 38–126)
Anion gap: 5 (ref 5–15)
BUN: 14 mg/dL (ref 8–23)
CO2: 28 mmol/L (ref 22–32)
Calcium: 9.2 mg/dL (ref 8.9–10.3)
Chloride: 105 mmol/L (ref 98–111)
Creatinine, Ser: 1.14 mg/dL (ref 0.61–1.24)
GFR, Estimated: >60 mL/min (ref >60)
Glucose, Bld: 90 mg/dL (ref 70–99)
Potassium: 4.2 mmol/L (ref 3.5–5.1)
Sodium: 138 mmol/L (ref 135–145)
Total Bilirubin: 0.5 mg/dL (ref 0.3–1.2)
Total Protein: 7.4 g/dL (ref 6.5–8.1)

## 2021-11-04 LAB — HCV RNA QUANT: HCV Quantitative: NOT DETECTED IU/mL (ref >50)

## 2021-11-04 LAB — GLUCOSE, CAPILLARY
Glucose-Capillary: 89 mg/dL (ref 70–99)
Glucose-Capillary: 101 mg/dL — ABNORMAL HIGH (ref 70–99)
Glucose-Capillary: 112 mg/dL — ABNORMAL HIGH (ref 70–99)

## 2021-11-04 LAB — MAGNESIUM: Magnesium: 1.8 mg/dL (ref 1.7–2.4)

## 2021-11-04 LAB — PROTIME-INR
INR: 1.2 (ref 0.8–1.2)
Prothrombin Time: 14.6 seconds (ref 11.4–15.2)

## 2021-11-04 MED ORDER — FENTANYL CITRATE (PF) 100 MCG/2ML IJ SOLN
INTRAMUSCULAR | Status: AC | PRN
  Administered 2021-11-04: 50 ug via INTRAVENOUS

## 2021-11-04 MED ORDER — FLUCONAZOLE 200 MG PO TABS: 200.0000 mg | ORAL_TABLET | Freq: Every day | ORAL | 0 refills | Status: AC

## 2021-11-04 MED ORDER — MIDAZOLAM HCL 2 MG/2ML IJ SOLN
INTRAMUSCULAR | Status: AC
  Filled 2021-11-04: qty 4

## 2021-11-04 MED ORDER — INSULIN GLARGINE 100 UNIT/ML ~~LOC~~ SOLN: 15.0000 [IU] | Freq: Every day | SUBCUTANEOUS | 11 refills | Status: DC

## 2021-11-04 MED ORDER — GELATIN ABSORBABLE 12-7 MM EX MISC
CUTANEOUS | Status: AC
  Filled 2021-11-04: qty 1

## 2021-11-04 MED ORDER — MIDAZOLAM HCL 2 MG/2ML IJ SOLN
INTRAMUSCULAR | Status: AC | PRN
  Administered 2021-11-04: 1 mg via INTRAVENOUS
  Administered 2021-11-04: .5 mg via INTRAVENOUS

## 2021-11-04 MED ORDER — LIDOCAINE HCL (PF) 1 % IJ SOLN
INTRAMUSCULAR | Status: AC
  Filled 2021-11-04: qty 30

## 2021-11-04 MED ORDER — FENTANYL CITRATE (PF) 100 MCG/2ML IJ SOLN
INTRAMUSCULAR | Status: AC
  Filled 2021-11-04: qty 4
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
## 2021-11-04 NOTE — TOC Initial Note (Signed)
Inpatient Diabetes Program Recommendations  AACE/ADA: New Consensus Statement on Inpatient Glycemic Control (2015)  Target Ranges:  Prepandial:   less than 140 mg/dL      Peak postprandial:   less than 180 mg/dL (1-2 hours)      Critically ill patients:  140 - 180 mg/dL   Lab Results  Component Value Date   GLUCAP 112 (H) 11/04/2021   HGBA1C 9.1 (H) 10/31/2021    Review of Glycemic Control  Diabetes history: DM2 Outpatient Diabetes medications: Semglee 35 units QD, Jardiance 25 mg QD, Metformin 850 mg BID Current orders for Inpatient glycemic control: Semglee 15 units QD, Jardiance 25 QD, Novolog 0-9 units TID  Spoke with patient at bedside.  Reviewed patient's current A1c of 9.1% (average BG of 215 mg/dL).  Explained what a A1c is and what it measures. Also reviewed goal A1c with patient, importance of good glucose control @ home, and blood sugar goals.  He states this is down from 13% in 2022.    He confirms above home medications.  He also wears a Freestyle Libre CGM.  He is current with PCP with [facility].  His medications are mailed to him from the [place].    He admits to "cheating" and drinks regular juice.  He states he has vision trouble and thirst with elevated glucose.  Encouraged him to switch to non-caloric beverages and The Plate Method.  Educated on The Plate Method, CHO's, portion control, CBGs at home fasting and mid afternoon, F/U with PCP every 3 months, bring meter to PCP office, long and short term complications of uncontrolled BG, and importance of exercise.  He states he knows he needs to do better and will try to make adjustments to his DM regimen.    Will continue to follow while inpatient.  Thank you, Dulce Sellar, MSN, CDCES Diabetes Coordinator Inpatient Diabetes Program [phone] (team pager from 8a-5p)

## 2021-11-05 LAB — SURGICAL PATHOLOGY

## 2021-11-15 ENCOUNTER — Other Ambulatory Visit: Payer: Self-pay

## 2021-11-15 DIAGNOSIS — C220 Liver cell carcinoma: Secondary | ICD-10-CM

## 2021-11-18 ENCOUNTER — Telehealth: Payer: Self-pay | Admitting: Hematology

## 2021-11-18 NOTE — Telephone Encounter (Signed)
Scheduled appt per 7/6 referral. Pt is aware of appt date and time. Pt is aware to arrive 15 mins prior to appt time and to bring and updated insurance card. Pt is aware of appt location.   

## 2021-11-19 ENCOUNTER — Other Ambulatory Visit: Payer: Self-pay

## 2021-11-19 ENCOUNTER — Emergency Department (HOSPITAL_COMMUNITY): Payer: No Typology Code available for payment source

## 2021-11-19 ENCOUNTER — Emergency Department (HOSPITAL_COMMUNITY)
Admission: EM | Admit: 2021-11-19 | Discharge: 2021-11-19 | Disposition: A | Discharge status: Home / Self Care | Payer: No Typology Code available for payment source | Attending: Emergency Medicine | Admitting: Emergency Medicine

## 2021-11-19 ENCOUNTER — Encounter (HOSPITAL_COMMUNITY): Payer: Self-pay

## 2021-11-19 DIAGNOSIS — Z79899 Other long term (current) drug therapy: Secondary | ICD-10-CM | POA: Diagnosis not present

## 2021-11-19 DIAGNOSIS — R5383 Other fatigue: Secondary | ICD-10-CM | POA: Diagnosis not present

## 2021-11-19 DIAGNOSIS — R1013 Epigastric pain: Secondary | ICD-10-CM | POA: Insufficient documentation

## 2021-11-19 DIAGNOSIS — N189 Chronic kidney disease, unspecified: Secondary | ICD-10-CM | POA: Diagnosis not present

## 2021-11-19 DIAGNOSIS — Z7984 Long term (current) use of oral hypoglycemic drugs: Secondary | ICD-10-CM | POA: Insufficient documentation

## 2021-11-19 DIAGNOSIS — E1122 Type 2 diabetes mellitus with diabetic chronic kidney disease: Secondary | ICD-10-CM | POA: Diagnosis not present

## 2021-11-19 DIAGNOSIS — R0602 Shortness of breath: Secondary | ICD-10-CM | POA: Diagnosis not present

## 2021-11-19 DIAGNOSIS — I81 Portal vein thrombosis: Secondary | ICD-10-CM

## 2021-11-19 DIAGNOSIS — R531 Weakness: Secondary | ICD-10-CM | POA: Diagnosis not present

## 2021-11-19 DIAGNOSIS — R1010 Upper abdominal pain, unspecified: Secondary | ICD-10-CM | POA: Diagnosis present

## 2021-11-19 DIAGNOSIS — C220 Liver cell carcinoma: Secondary | ICD-10-CM

## 2021-11-19 DIAGNOSIS — I129 Hypertensive chronic kidney disease with stage 1 through stage 4 chronic kidney disease, or unspecified chronic kidney disease: Secondary | ICD-10-CM | POA: Insufficient documentation

## 2021-11-19 DIAGNOSIS — Z794 Long term (current) use of insulin: Secondary | ICD-10-CM | POA: Insufficient documentation

## 2021-11-19 LAB — CBC WITH DIFFERENTIAL/PLATELET
Abs Immature Granulocytes: 0.05 10*3/uL (ref 0.00–0.07)
Basophils Absolute: 0 10*3/uL (ref 0.0–0.1)
Basophils Relative: 0 %
Eosinophils Absolute: 0 10*3/uL (ref 0.0–0.5)
Eosinophils Relative: 0 %
HCT: 28.5 % — ABNORMAL LOW (ref 39.0–52.0)
Hemoglobin: 8.8 g/dL — ABNORMAL LOW (ref 13.0–17.0)
Immature Granulocytes: 1 %
Lymphocytes Relative: 9 %
Lymphs Abs: 0.9 10*3/uL (ref 0.7–4.0)
MCH: 26.1 pg (ref 26.0–34.0)
MCHC: 30.9 g/dL (ref 30.0–36.0)
MCV: 84.6 fL (ref 80.0–100.0)
Monocytes Absolute: 1 10*3/uL (ref 0.1–1.0)
Monocytes Relative: 10 %
Neutro Abs: 7.9 10*3/uL — ABNORMAL HIGH (ref 1.7–7.7)
Neutrophils Relative %: 80 %
Platelets: 252 10*3/uL (ref 150–400)
RBC: 3.37 MIL/uL — ABNORMAL LOW (ref 4.22–5.81)
RDW: 16.6 % — ABNORMAL HIGH (ref 11.5–15.5)
WBC: 9.9 10*3/uL (ref 4.0–10.5)
nRBC: 0 % (ref 0.0–0.2)

## 2021-11-19 LAB — URINALYSIS, ROUTINE W REFLEX MICROSCOPIC
Bilirubin Urine: NEGATIVE
Glucose, UA: >=500 mg/dL — AB
Hgb urine dipstick: NEGATIVE
Ketones, ur: 20 mg/dL — AB
Leukocytes,Ua: NEGATIVE
Nitrite: NEGATIVE
Protein, ur: NEGATIVE mg/dL
Specific Gravity, Urine: 1.021 (ref 1.005–1.030)
pH: 5 (ref 5.0–8.0)

## 2021-11-19 LAB — COMPREHENSIVE METABOLIC PANEL
ALT: 27 U/L (ref 0–44)
AST: 70 U/L — ABNORMAL HIGH (ref 15–41)
Albumin: 2.8 g/dL — ABNORMAL LOW (ref 3.5–5.0)
Alkaline Phosphatase: 216 U/L — ABNORMAL HIGH (ref 38–126)
Anion gap: 17 — ABNORMAL HIGH (ref 5–15)
BUN: 36 mg/dL — ABNORMAL HIGH (ref 8–23)
CO2: 22 mmol/L (ref 22–32)
Calcium: 10.3 mg/dL (ref 8.9–10.3)
Chloride: 99 mmol/L (ref 98–111)
Creatinine, Ser: 1.71 mg/dL — ABNORMAL HIGH (ref 0.61–1.24)
GFR, Estimated: 43 mL/min — ABNORMAL LOW (ref >60)
Glucose, Bld: 207 mg/dL — ABNORMAL HIGH (ref 70–99)
Potassium: 4.9 mmol/L (ref 3.5–5.1)
Sodium: 138 mmol/L (ref 135–145)
Total Bilirubin: 0.7 mg/dL (ref 0.3–1.2)
Total Protein: 9.1 g/dL — ABNORMAL HIGH (ref 6.5–8.1)

## 2021-11-19 LAB — LIPASE, BLOOD: Lipase: 40 U/L (ref 11–51)

## 2021-11-19 LAB — POC OCCULT BLOOD, ED: Fecal Occult Bld: NEGATIVE

## 2021-11-19 MED ORDER — IOHEXOL 300 MG/ML  SOLN
75.0000 mL | Freq: Once | INTRAMUSCULAR | Status: AC | PRN
  Administered 2021-11-19: 75 mL via INTRAVENOUS

## 2021-11-19 MED ORDER — MORPHINE SULFATE (PF) 4 MG/ML IV SOLN
4.0000 mg | Freq: Once | INTRAVENOUS | Status: AC
  Administered 2021-11-19: 4 mg via INTRAVENOUS
  Filled 2021-11-19: qty 1

## 2021-11-19 MED ORDER — PANTOPRAZOLE SODIUM 40 MG IV SOLR
40.0000 mg | Freq: Once | INTRAVENOUS | Status: DC
  Filled 2021-11-19: qty 10

## 2021-11-19 MED ORDER — ONDANSETRON HCL 4 MG PO TABS
4.0000 mg | ORAL_TABLET | Freq: Four times a day (QID) | ORAL | 0 refills | Status: DC
Start: 2021-11-19 — End: 2021-12-06

## 2021-11-19 MED ORDER — MORPHINE SULFATE 20 MG/5ML PO SOLN: 2.5000 mg | ORAL | 0 refills | Status: DC | PRN

## 2021-11-19 NOTE — ED Provider Triage Note (Signed)
Emergency Medicine Provider Triage Evaluation Note  Aijalon Kirtz , a 66 y.o. male  was evaluated in triage.  Patient was discharged from the hospital on 11/04/2021 for portal vein thrombosis with liver mass suspicious for hepatocellular carcinoma.  He presents today with complaints of generalized abdominal pain worse across the lower abdomen and then radiating up the middle to his epigastrium.  This started about 3 days ago and he describes the pain as aching as if he "ate something bad".  He also states his stools appear black now.  He now follows with Dr. Paul Half with gastroenterology and Dr. Burr Medico with oncology.  Last bowel movement was this morning.  He denies diarrhea and constipation.  Denies fever, chills, chest pain, shortness of breath.  Review of Systems  Positive: Abdominal pain, black stools Negative: Nausea, vomiting  Physical Exam  BP (!) 146/72 (BP Location: Right Arm)   Pulse (!) 108   Temp 98.8 F (37.1 C) (Oral)   Resp 18   Ht 5' 10.5" (1.791 m)   Wt 68.9 kg   SpO2 97%   BMI 21.50 kg/m  Gen:   Awake, no distress   Resp:  Normal effort  MSK:   Moves extremities without difficulty  Other:  Abdomen is soft, nondistended.  There is tenderness on the left lower quadrant to the suprapubic area and then extending upwards to the epigastrium.   Medical Decision Making  Medically screening exam initiated at 1:16 PM.  Appropriate orders placed.  Heitor Steinhoff was informed that the remainder of the evaluation will be completed by another provider, this initial triage assessment does not replace that evaluation, and the importance of remaining in the ED until their evaluation is complete.     Tonye Pearson, [place] 11/19/21 1320

## 2021-11-19 NOTE — Discharge Instructions (Signed)
Morphine liquid was sent to your pharmacy.  Please be careful when taking this medication and only take the prescribed amount.  You can take this medication every 4 hours as needed for pain.  This medication can be very sedating and life-threatening if overdosed on.  Please take this slowly and do not need to take if you are confused, sedated, sleepy, or feeling unwell.

## 2021-11-19 NOTE — ED Provider Notes (Signed)
6:59 PM Patient's care assuemd by myself.   Pt recently discharged for portal venous thrombosis and liver pass. Presenting to ED for abdominal pain with possible intermittent black-blood in stool.   Workup: hemoglobin 8.8, hypoalbuminemia, aki, mass increased on CT and new thrombus pain portal vein.   Following with GI, Dr. Bryan Lemma   Plan: Labs and dispo  Physical Exam  BP 133/74 (BP Location: Right Arm)   Pulse (!) 109   Temp 98.6 F (37 C) (Oral)   Resp 15   Ht 5' 10.5" (1.791 m)   Wt 68.9 kg   SpO2 96%   BMI 21.50 kg/m   Physical Exam Vitals and nursing note reviewed.  Constitutional:      General: He is not in acute distress.    Appearance: He is well-developed.  HENT:     Head: Normocephalic and atraumatic.  Eyes:     Conjunctiva/sclera: Conjunctivae normal.  Cardiovascular:     Rate and Rhythm: Normal rate and regular rhythm.     Heart sounds: No murmur heard. Pulmonary:     Effort: Pulmonary effort is normal. No respiratory distress.     Breath sounds: Normal breath sounds.  Abdominal:     Palpations: Abdomen is soft.     Tenderness: There is no abdominal tenderness.  Musculoskeletal:        General: No swelling.     Cervical back: Neck supple.  Skin:    General: Skin is warm and dry.     Capillary Refill: Capillary refill takes less than 2 seconds.  Neurological:     Mental Status: He is alert.  Psychiatric:        Mood and Affect: Mood normal.     Procedures  Procedures  ED Course / MDM   Clinical Course as of 11/19/21 2134  Tue Nov 19, 2021  2035 Hemoglobin(!): 8.8 [AG]    Clinical Course User Index [AG] Lianne Cure, DO   Medical Decision Making Amount and/or Complexity of Data Reviewed Labs:  Decision-making details documented in ED Course.  Risk Prescription drug management.   39:32 PM  66 year old male recently diagnosed with hepatocellular carcinoma after liver mass was found with portal vein thrombosis and biopsy  completed.  Patient presenting to ED for worsening abdominal pain.  Concerns for black stools.  Hemoccult negative.  Hemoglobin stable and elevated from previous study of 8.  Currently 8.8.  I spoke with GI specialist with [facility] who has reviewed patient's images in chart and recommends no new thromboses in the portal vein.  I also spoke with on-call oncologist who states patient should continue with follow-up appointment this week on Thursday.  Patient given liquid morphine for pain control at home with Zofran.  Recommended to return to emergency department immediately for any worsening concerning signs or symptoms including uncontrollable pain.   Patient in no distress and overall condition improved here in the ED. Detailed discussions were had with the patient regarding current findings, and need for close f/u with PCP or on call doctor. The patient has been instructed to return immediately if the symptoms worsen in any way for re-evaluation. Patient verbalized understanding and is in agreement with current care plan. All questions answered prior to discharge.        Lianne Cure, DO 68/93/57 [id number]

## 2021-11-19 NOTE — ED Provider Notes (Signed)
[facility] EMERGENCY DEPARTMENT Provider Note   CSN: [id number] Arrival date & time: 11/19/21  1224     History  Chief Complaint  Patient presents with   Abdominal Pain    Troy [name] is a 66 y.o. male who presents the emergency department complaining of generalized weakness and abdominal pain.  Patient was recently discharged from the hospital on 6/26 for portal vein thrombosis with liver mass suspicious for hepatocellular carcinoma.  He states that after getting out of the hospital he started feeling a bit better, and then 3 days ago he started having aching pain in his upper abdomen.  He states that now he is seeing dark stools intermittently.  He follows with Dr. Paul Half with gastroenterology and Dr. Burr Medico with oncology, he supposed to start cancer treatment in 2 days.  Denies any fever, chills, chest pain, diarrhea or constipation.  He has been feeling slightly more short of breath with exertion.   Abdominal Pain Associated symptoms: shortness of breath   Associated symptoms: no chest pain, no fever, no nausea and no vomiting        Home Medications Prior to Admission medications   Medication Sig Start Date End Date Taking? Authorizing Provider  amLODipine (NORVASC) 10 MG tablet Take 10 mg by mouth daily. 07/22/21 07/23/22  [provider]  carboxymethylcellulose (REFRESH PLUS) 0.5 % SOLN 1 drop 4 (four) times daily as needed (dry eyes).    [provider]  Cholecalciferol 50 MCG (2000 UT) TABS Take 4,000 Units by mouth every Monday, Wednesday, and Friday. 01/02/10   [provider]  empagliflozin (JARDIANCE) 25 MG TABS tablet Take 25 mg by mouth daily. 11/04/19   [provider]  fluconazole (DIFLUCAN) 200 MG tablet Take 1 tablet (200 mg total) by mouth daily for 18 days. 11/05/21 11/23/21  Dwyane Dee, MD  hydrocortisone 1 % ointment Apply 1 Application topically 2 (two) times daily as needed for itching.    [provider]  ibuprofen (ADVIL) 200 MG tablet Take 200 mg by mouth every 8 (eight) hours as needed for moderate pain.    [provider]  insulin glargine (LANTUS) 100 UNIT/ML injection Inject 0.15 mLs (15 Units total) into the skin daily. 11/04/21   Dwyane Dee, MD  lisinopril (PRINIVIL,ZESTRIL) 40 MG tablet Take 40 mg by mouth daily.    [provider]  metFORMIN (GLUCOPHAGE) 850 MG tablet Take 850 mg by mouth daily.    [provider]  NON FORMULARY Place 1 application  into both eyes at bedtime. Ocular lubricant ointment    [provider]  pantoprazole (PROTONIX) 40 MG tablet Take 40 mg by mouth daily. 07/22/21 07/23/22  [provider]  sildenafil (VIAGRA) 100 MG tablet Take 100 mg by mouth daily as needed for erectile dysfunction.    [provider]      Allergies    Patient has no known allergies.    Review of Systems   Review of Systems  Constitutional:  Negative for fever.  Respiratory:  Positive for shortness of breath.   Cardiovascular:  Negative for chest pain.  Gastrointestinal:  Positive for abdominal pain and blood in stool. Negative for nausea and vomiting.  Neurological:  Positive for weakness.  All other systems reviewed and are negative.   Physical Exam Updated Vital Signs BP 133/74 (BP Location: Right Arm)   Pulse (!) 109   Temp 98.6 F (37 C) (Oral)   Resp 15   Ht 5' 10.5" (  1.791 m)   Wt 68.9 kg   SpO2 96%   BMI 21.50 kg/m  Physical Exam Vitals and nursing note reviewed. Exam conducted with a chaperone present.  Constitutional:      Appearance: Normal appearance.     Comments: Appears fatigued  HENT:     Head: Normocephalic and atraumatic.  Eyes:     Conjunctiva/sclera: Conjunctivae normal.  Cardiovascular:     Rate and Rhythm: Normal rate and regular rhythm.  Pulmonary:     Effort: Pulmonary effort is normal. No respiratory distress.     Breath sounds: Normal breath sounds.  Abdominal:      General: There is no distension.     Palpations: Abdomen is soft.     Tenderness: There is abdominal tenderness in the epigastric area. There is no guarding or rebound.  Genitourinary:    Rectum: Guaiac result negative.  Skin:    General: Skin is warm and dry.  Neurological:     General: No focal deficit present.     Mental Status: He is alert.     ED Results / Procedures / Treatments   Labs (all labs ordered are listed, but only abnormal results are displayed) Labs Reviewed  COMPREHENSIVE METABOLIC PANEL - Abnormal; Notable for the following components:      Result Value   Glucose, Bld 207 (*)    BUN 36 (*)    Creatinine, Ser 1.71 (*)    Total Protein 9.1 (*)    Albumin 2.8 (*)    AST 70 (*)    Alkaline Phosphatase 216 (*)    GFR, Estimated 43 (*)    Anion gap 17 (*)    All other components within normal limits  CBC WITH DIFFERENTIAL/PLATELET - Abnormal; Notable for the following components:   RBC 3.37 (*)    Hemoglobin 8.8 (*)    HCT 28.5 (*)    RDW 16.6 (*)    Neutro Abs 7.9 (*)    All other components within normal limits  URINALYSIS, ROUTINE W REFLEX MICROSCOPIC - Abnormal; Notable for the following components:   Glucose, UA >=500 (*)    Ketones, ur 20 (*)    Bacteria, UA RARE (*)    All other components within normal limits  LIPASE, BLOOD  POC OCCULT BLOOD, ED    EKG None  Radiology CT ABDOMEN PELVIS W CONTRAST  Result Date: 11/19/2021 CLINICAL DATA:  New diagnosis of liver cancer.  Abdominal pain. EXAM: CT ABDOMEN AND PELVIS WITH CONTRAST TECHNIQUE: Multidetector CT imaging of the abdomen and pelvis was performed using the standard protocol following bolus administration of intravenous contrast. RADIATION DOSE REDUCTION: This exam was performed according to the departmental dose-optimization program which includes automated exposure control, adjustment of the mA and/or kV according to patient size and/or use of iterative reconstruction technique. CONTRAST:   23m OMNIPAQUE IOHEXOL 300 MG/ML  SOLN COMPARISON:  CT chest abdomen and pelvis 10/31/2021 FINDINGS: Lower chest: Lung bases are clear. Punctate metallic foreign bodies are seen in the lower left chest likely related to old injury. Hepatobiliary: Ill-defined infiltrative mass involving the central and left lobe of the liver is mildly increased in size measuring up to 15 x 13 cm image 3/13 (previously 12.6 x 10.5 cm). Additional mass in the lateral left lobe has increased in size now measuring 6.9 x 5.8 cm with increasing necrotic center. There is diffuse involvement of the caudate lobe and intrahepatic IVC as seen on the prior study. Left and right portal vein thrombus  identified. There is some new thrombus within the main portal vein. No biliary ductal dilatation. Gallbladder grossly within normal limits. Pancreas: Unremarkable. No pancreatic ductal dilatation or surrounding inflammatory changes. Spleen: Normal in size without focal abnormality. Calcified granulomas are present. Adrenals/Urinary Tract: Adrenal glands are unremarkable. Kidneys are normal, without renal calculi, focal lesion, or hydronephrosis. Bladder is unremarkable. Stomach/Bowel: Stomach is within normal limits. There is wall thickening of the distal esophagus. Appendix appears normal. No evidence of bowel wall thickening, distention, or inflammatory changes. Vascular/Lymphatic: There are atherosclerotic calcifications of the aorta. Aorta and IVC are normal in size. 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 Reproductive: Prostate is unremarkable. Other: There is no ascites or free air. Subcutaneous stranding in the anterior abdominal wall likely related to medication injection site, unchanged. Enlarged gastrohepatic lymph node has not significantly changed measuring 12 x 24 mm. Periportal lymphadenopathy measuring 1.9 by 2.4 cm has mildly increased. Musculoskeletal: No acute or significant osseous findings. IMPRESSION: 1.  Infiltrative central and left hepatic mass has mildly increased in size worrisome for primary hepatocellular carcinoma. 2. Separate lesion in the lateral left lobe of the liver has also increased in size worrisome for metastatic disease. 3. There is new thrombus within the main portal vein. There is stable thrombus and tumor invasion of the right portal vein, left portal vein and intrahepatic IVC. 4. Periportal lymphadenopathy has mildly increased. Gastrohepatic lymphadenopathy is stable. 5. Mild wall thickening of the distal esophagus may represent esophagitis. Electronically Signed   By: Ronney Asters M.D.   On: 11/19/2021 16:21    Procedures Procedures    Medications Ordered in ED Medications  iohexol (OMNIPAQUE) 300 MG/ML solution 75 mL (75 mLs Intravenous Contrast Given 11/19/21 1610)    ED Course/ Medical Decision Making/ A&P                           Medical Decision Making This patient is a 66 y.o. male  who presents to the ED for concern of abdominal pain. Recently discharged from hospital for portal vein thrombosis with liver mass concerning for hepatocellular carcinoma.   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 Pertinent results include: Patient was discharged on 6/26 after portal vein thrombosis and liver mass, was seen by gastroenterology during admission.  Has appointment to start cancer treatment in 2 days.  Physical Exam: Physical exam performed. The pertinent findings include: Appears chronically ill.  Abdomen soft with epigastric tenderness to palpation. Guaiac result negative.   Lab Tests/Imaging studies: I personally interpreted labs/imaging and the pertinent results include:  Hemoglobin 8.8, Creatinine 1.71, Albumin 2.8, Alk Phos 216.  CT abdomen pelvis with worsening liver mass, new liver mass, and new portal vein  thrombosis.  I agree with the radiologist interpretation.   Disposition: Patient discussed and care transferred to attending physician Dr. Pearline Cables at shift change. Please see his/her note for further details regarding further ED course and disposition. Plan at time of handoff is to consult GI and admit for portal vein thrombosis and worsening liver function.  Final Clinical Impression(s) / ED Diagnoses Final diagnoses:  None    Rx / DC Orders ED Discharge Orders     None      Portions of this report may have been transcribed using voice recognition software. Every effort was made to ensure accuracy; however, inadvertent computerized transcription  errors may be present.    Kateri Plummer, PA-C 67/67/20 [id number]    Campbell Stall P, DO 96/28/36 1515

## 2021-11-19 NOTE — ED Triage Notes (Signed)
Pt arrived POV from home c/o lower abdominal pain x2-3 days. Pt states he has also noticed some black stools since yesterday.

## 2021-11-19 NOTE — ED Notes (Signed)
Patient verbalizes understanding of discharge instructions. Opportunity for questioning and answers were provided. Armband removed by staff, pt discharged from ED. Pt taken to ED waiting room via wheel chair.  

## 2021-11-20 NOTE — Progress Notes (Signed)
GI Location of Tumor / Histology: Hepatocellular Carcinoma  Troy [name] presented to the ER with worsening anemia.  He reported unintentional weight loss since approximately February, scrotal swelling, stomach and back pain, and easily tiring with minimal exertion.   CTA Chest 10/31/2021: No evidence for acute pulmonary embolism. 2. Cirrhosis with portal venous hypertension.  Large mass which involves much of the left hepatic lobe is better seen on the CT of the abdomen and pelvis which was performed earlier today. Please refer to the report from the CT of the abdomen pelvis performed earlier today.  CT Abd/Pelvis 10/31/2021: Large hepatic mass involving left lobe and caudate lobe worrisome for [place].  Additional masses in the left lobe were also noted worrisome for metastatic disease.  Left and right portal vein thrombosis also noted with compression/invasion of the intrahepatic and infrahepatic IVC.   Biopsies of Liver 11/04/2021    Past/Anticipated interventions by surgeon, if any:   Past/Anticipated interventions by medical oncology, if any:  Dr. Burr Medico 11/25/2021   Weight changes, if any:   Bowel/Bladder complaints, if any:   Nausea / Vomiting, if any:   Pain issues, if any:    Any blood per rectum:     SAFETY ISSUES: Prior radiation?  Pacemaker/ICD?  Possible current pregnancy? N/a Is the patient on methotrexate?   Current Complaints/Details:

## 2021-11-21 ENCOUNTER — Encounter: Payer: Self-pay | Admitting: Radiation Oncology

## 2021-11-21 ENCOUNTER — Ambulatory Visit
Admission: RE | Admit: 2021-11-21 | Discharge: 2021-11-21 | Disposition: A | Discharge status: Home / Self Care | Payer: No Typology Code available for payment source | Source: Ambulatory Visit | Attending: Radiation Oncology | Admitting: Radiation Oncology

## 2021-11-21 ENCOUNTER — Telehealth: Payer: Self-pay | Admitting: Hematology

## 2021-11-21 ENCOUNTER — Other Ambulatory Visit: Payer: Self-pay

## 2021-11-21 VITALS — BP 132/68 | HR 108 | Temp 98.2°F | Resp 18 | Ht 70.5 in | Wt 147.0 lb

## 2021-11-21 DIAGNOSIS — E119 Type 2 diabetes mellitus without complications: Secondary | ICD-10-CM | POA: Insufficient documentation

## 2021-11-21 DIAGNOSIS — I81 Portal vein thrombosis: Secondary | ICD-10-CM | POA: Insufficient documentation

## 2021-11-21 DIAGNOSIS — B182 Chronic viral hepatitis C: Secondary | ICD-10-CM | POA: Insufficient documentation

## 2021-11-21 DIAGNOSIS — C220 Liver cell carcinoma: Secondary | ICD-10-CM | POA: Diagnosis present

## 2021-11-21 DIAGNOSIS — Z87891 Personal history of nicotine dependence: Secondary | ICD-10-CM | POA: Diagnosis not present

## 2021-11-21 DIAGNOSIS — I8510 Secondary esophageal varices without bleeding: Secondary | ICD-10-CM | POA: Diagnosis not present

## 2021-11-21 DIAGNOSIS — I10 Essential (primary) hypertension: Secondary | ICD-10-CM | POA: Insufficient documentation

## 2021-11-21 DIAGNOSIS — Z7984 Long term (current) use of oral hypoglycemic drugs: Secondary | ICD-10-CM | POA: Insufficient documentation

## 2021-11-21 DIAGNOSIS — Z79899 Other long term (current) drug therapy: Secondary | ICD-10-CM | POA: Diagnosis not present

## 2021-11-21 DIAGNOSIS — Z794 Long term (current) use of insulin: Secondary | ICD-10-CM | POA: Diagnosis not present

## 2021-11-21 DIAGNOSIS — R590 Localized enlarged lymph nodes: Secondary | ICD-10-CM | POA: Insufficient documentation

## 2021-11-21 NOTE — Telephone Encounter (Signed)
Called pt to confirm appt per 7/12 staff msg. No answer. Left msg with appt date/time.

## 2021-11-22 ENCOUNTER — Ambulatory Visit
Admission: RE | Admit: 2021-11-22 | Discharge: 2021-11-22 | Disposition: A | Discharge status: Home / Self Care | Payer: No Typology Code available for payment source | Source: Ambulatory Visit | Attending: Radiation Oncology | Admitting: Radiation Oncology

## 2021-11-22 DIAGNOSIS — Z510 Encounter for antineoplastic radiation therapy: Secondary | ICD-10-CM | POA: Insufficient documentation

## 2021-11-22 DIAGNOSIS — C220 Liver cell carcinoma: Secondary | ICD-10-CM | POA: Insufficient documentation

## 2021-11-23 DIAGNOSIS — C220 Liver cell carcinoma: Secondary | ICD-10-CM | POA: Insufficient documentation
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
## 2021-11-25 ENCOUNTER — Inpatient Hospital Stay: Payer: No Typology Code available for payment source | Admitting: Licensed Clinical Social Worker

## 2021-11-25 ENCOUNTER — Inpatient Hospital Stay: Payer: No Typology Code available for payment source

## 2021-11-25 ENCOUNTER — Other Ambulatory Visit: Payer: Self-pay

## 2021-11-25 ENCOUNTER — Inpatient Hospital Stay: Payer: No Typology Code available for payment source | Attending: Hematology | Admitting: Hematology

## 2021-11-25 ENCOUNTER — Encounter: Payer: Self-pay | Admitting: Hematology

## 2021-11-25 VITALS — BP 138/77 | HR 117 | Temp 98.4°F | Resp 19 | Ht 70.0 in | Wt 140.8 lb

## 2021-11-25 DIAGNOSIS — I8510 Secondary esophageal varices without bleeding: Secondary | ICD-10-CM | POA: Diagnosis not present

## 2021-11-25 DIAGNOSIS — R634 Abnormal weight loss: Secondary | ICD-10-CM | POA: Diagnosis not present

## 2021-11-25 DIAGNOSIS — N1832 Chronic kidney disease, stage 3b: Secondary | ICD-10-CM | POA: Insufficient documentation

## 2021-11-25 DIAGNOSIS — D649 Anemia, unspecified: Secondary | ICD-10-CM | POA: Insufficient documentation

## 2021-11-25 DIAGNOSIS — N433 Hydrocele, unspecified: Secondary | ICD-10-CM | POA: Insufficient documentation

## 2021-11-25 DIAGNOSIS — R1030 Lower abdominal pain, unspecified: Secondary | ICD-10-CM

## 2021-11-25 DIAGNOSIS — R350 Frequency of micturition: Secondary | ICD-10-CM | POA: Diagnosis not present

## 2021-11-25 DIAGNOSIS — I129 Hypertensive chronic kidney disease with stage 1 through stage 4 chronic kidney disease, or unspecified chronic kidney disease: Secondary | ICD-10-CM | POA: Insufficient documentation

## 2021-11-25 DIAGNOSIS — G893 Neoplasm related pain (acute) (chronic): Secondary | ICD-10-CM | POA: Diagnosis not present

## 2021-11-25 DIAGNOSIS — K449 Diaphragmatic hernia without obstruction or gangrene: Secondary | ICD-10-CM | POA: Diagnosis not present

## 2021-11-25 DIAGNOSIS — K5900 Constipation, unspecified: Secondary | ICD-10-CM | POA: Insufficient documentation

## 2021-11-25 DIAGNOSIS — C220 Liver cell carcinoma: Secondary | ICD-10-CM

## 2021-11-25 DIAGNOSIS — Z79899 Other long term (current) drug therapy: Secondary | ICD-10-CM | POA: Insufficient documentation

## 2021-11-25 DIAGNOSIS — I868 Varicose veins of other specified sites: Secondary | ICD-10-CM | POA: Diagnosis not present

## 2021-11-25 DIAGNOSIS — E1122 Type 2 diabetes mellitus with diabetic chronic kidney disease: Secondary | ICD-10-CM | POA: Insufficient documentation

## 2021-11-25 DIAGNOSIS — Z800 Family history of malignant neoplasm of digestive organs: Secondary | ICD-10-CM | POA: Insufficient documentation

## 2021-11-25 DIAGNOSIS — M549 Dorsalgia, unspecified: Secondary | ICD-10-CM | POA: Diagnosis not present

## 2021-11-25 DIAGNOSIS — Z87891 Personal history of nicotine dependence: Secondary | ICD-10-CM | POA: Insufficient documentation

## 2021-11-25 DIAGNOSIS — Z8249 Family history of ischemic heart disease and other diseases of the circulatory system: Secondary | ICD-10-CM | POA: Insufficient documentation

## 2021-11-25 DIAGNOSIS — Z7901 Long term (current) use of anticoagulants: Secondary | ICD-10-CM | POA: Insufficient documentation

## 2021-11-25 DIAGNOSIS — Z5112 Encounter for antineoplastic immunotherapy: Secondary | ICD-10-CM | POA: Diagnosis present

## 2021-11-25 DIAGNOSIS — Z8619 Personal history of other infectious and parasitic diseases: Secondary | ICD-10-CM | POA: Insufficient documentation

## 2021-11-25 DIAGNOSIS — R682 Dry mouth, unspecified: Secondary | ICD-10-CM | POA: Insufficient documentation

## 2021-11-25 DIAGNOSIS — R109 Unspecified abdominal pain: Secondary | ICD-10-CM | POA: Insufficient documentation

## 2021-11-25 DIAGNOSIS — R0609 Other forms of dyspnea: Secondary | ICD-10-CM | POA: Diagnosis not present

## 2021-11-25 DIAGNOSIS — Z820 Family history of epilepsy and other diseases of the nervous system: Secondary | ICD-10-CM | POA: Insufficient documentation

## 2021-11-25 DIAGNOSIS — K2100 Gastro-esophageal reflux disease with esophagitis, without bleeding: Secondary | ICD-10-CM | POA: Diagnosis not present

## 2021-11-25 DIAGNOSIS — I81 Portal vein thrombosis: Secondary | ICD-10-CM | POA: Insufficient documentation

## 2021-11-25 DIAGNOSIS — K7469 Other cirrhosis of liver: Secondary | ICD-10-CM | POA: Insufficient documentation

## 2021-11-25 DIAGNOSIS — R590 Localized enlarged lymph nodes: Secondary | ICD-10-CM | POA: Diagnosis not present

## 2021-11-25 DIAGNOSIS — I219 Acute myocardial infarction, unspecified: Secondary | ICD-10-CM

## 2021-11-25 MED ORDER — APIXABAN 5 MG PO TABS: 5.0000 mg | ORAL_TABLET | Freq: Two times a day (BID) | ORAL | 1 refills | Status: DC

## 2021-11-25 MED ORDER — NYSTATIN 100000 UNIT/ML MT SUSP: 5.0000 mL | Freq: Four times a day (QID) | OROMUCOSAL | 0 refills | Status: DC

## 2021-11-25 MED ORDER — MORPHINE SULFATE (PF) 2 MG/ML IV SOLN
2.0000 mg | Freq: Once | INTRAVENOUS | Status: AC
  Administered 2021-11-25: 2 mg via INTRAVENOUS
  Filled 2021-11-25: qty 1

## 2021-11-25 NOTE — Progress Notes (Signed)
I met with Troy [name] and Troy [name] before  Troy consultation with Dr Burr Medico.  I explained my role as a nurse navigator and provided my contact information.I explained the services provided at [facility] and provided written information. Troy [name] was in severe pain.  He had not taken any pain medications since this am.  He has liquid MS prescribed by the ED.  I spoke with Troy Mems, NP-C, palliative care.  She wants him to take the MS every 4 hours around the clock.  She has scheduled a phone visit with him for tomorrow at 1300.  I reviewed this with him and Troy [name].  I informed Dr Burr Medico and told her he would need something now for Troy pain. All questions were answered. They verbalized understanding.

## 2021-11-25 NOTE — Progress Notes (Addendum)
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 portal vein thrombosis. 4. 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 5. Gastrohepatic and periportal lymphadenopathy. These results were called by telephone at the time of interpretation on 10/31/2021 at 4:04 pm to [facility], who verbally acknowledged these results. Electronically Signed   By: ARonney AstersM.D.   On: 10/31/2021 16:04   [place]SCROTUM W/DOPPLER  Result Date: 10/31/2021 CLINICAL DATA:  Acute right scrotal swelling. EXAM: SCROTAL ULTRASOUND DOPPLER ULTRASOUND OF THE TESTICLES TECHNIQUE: Complete ultrasound examination of the testicles, epididymis, and other scrotal structures was performed. Color and spectral Doppler ultrasound were also utilized to evaluate blood flow to the testicles. COMPARISON:  None Available. FINDINGS: Right testicle Measurements: 3.3 x 2.3 x 2.1 cm. No  mass or microlithiasis visualized. Left testicle Measurements: 3.5 x 1.4 x 1.8 cm. No mass or microlithiasis visualized. Right epididymis:  Normal in size and appearance. Left epididymis:  Normal in size and appearance. Hydrocele:  Bilateral hydroceles are noted, right greater than left. Varicocele:  None visualized. Pulsed Doppler interrogation of both testes demonstrates normal low resistance arterial and venous waveforms bilaterally. IMPRESSION: No evidence of testicular mass or torsion. Bilateral hydroceles are noted, right greater than left. Electronically Signed   By: Marijo Conception M.D.   On: 10/31/2021 13:48     No orders of the defined types were placed in this encounter.   All questions were answered. The patient knows to call the clinic with any problems, questions or concerns. The total time spent in the appointment was 60 minutes.     [name], MD 11/25/2021   I, Wilburn Mylar, am acting as scribe for [name], MD.   I have reviewed the above documentation for accuracy and completeness, and I agree with the above.

## 2021-11-25 NOTE — Progress Notes (Signed)
Troy [name]  Initial Assessment   Troy [name] is a 65 y.o. year old male accompanied by patient and wife. Clinical Social [name] was referred by medical provider for assessment of psychosocial needs.   SDOH (Social Determinants of Health) assessments performed: Yes   SDOH Screenings   Alcohol Screen: Not on file  Depression (TMA2-6): Not on file  Financial Resource Strain: Not on file  Food Insecurity: Not on file  Housing: Not on file  Physical Activity: Not on file  Social Connections: Not on file  Stress: Not on file  Tobacco Use: Medium Risk (11/25/2021)   Patient History    Smoking Tobacco Use: Former    Smokeless Tobacco Use: Never    Passive Exposure: Not on file  Transportation Needs: Not on file     Distress Screen completed: No    11/21/2021    2:21 PM  ONCBCN DISTRESS SCREENING  Screening Type Initial Screening  Distress experienced in past week (1-10) 1  Emotional problem type Adjusting to illness  Information Concerns Type Lack of info about diagnosis;Lack of info about treatment  Physical Problem type Pain;Sleep/insomnia;Sexual problems;Breathing      Family/Social Information:  Housing Arrangement: patient lives with his wife and their 2 adult children who have recently moved back in.   Their daughter is 86 and their son is 59. Family members/support persons in your life? Family Transportation concerns: no  Employment: Retired .  Income source: [employer] works in [place] and has FMLA benefits, but is also eligible for retirement.  Based on treatment plan, spouse will decide if she will continue working or retire to assist pt w/ care he may need. Financial concerns: No Type of concern: None Food access concerns: no Religious or spiritual practice: Yes-Christian Services Currently in place:  [place] benefits  Coping/ Adjustment to diagnosis: Patient understands treatment plan and what happens  next? Pt recently discharged from the hospital following diagnosis and is meeting with Dr. Burr Medico to discuss treatment options. Concerns about diagnosis and/or treatment: Overwhelmed by information, How will I care for myself, and Quality of life Patient reported stressors: Adjusting to my illness and Facing my mortality Hopes and/or priorities: Pt's hope is to start treatment w/ the hope of positive results. Patient enjoys  not addressed Current coping skills/ strengths: Supportive family/friends     SUMMARY: Current SDOH Barriers:  Pt/wife do not yet know what treatment will be and how much time wife may need off of [name].  Barriers not yet determined.  Clinical Social [name] Clinical Goal(s):  No clinical social [name] goals at this time  Interventions: Discussed common feeling and emotions when being diagnosed with cancer, and the importance of support during treatment Informed patient of the support team roles and support services at [facility] Provided [place] contact information and encouraged patient to call with any questions or concerns    Follow Up Plan: Patient will contact CSW with any support or resource needs Patient verbalizes understanding of plan: Yes    Henriette Combs, LCSW

## 2021-11-25 NOTE — Patient Instructions (Signed)
Pain Medicine Instructions You may need pain medicine after an injury or illness. There are many types of pain medicine. Two common types of pain medicine are: Non-opioid pain medicines. These include: Over-the-counter (OTC) medicines such as acetaminophen or non-steroidal anti-inflammatory medicines (NSAIDS). Prescription medicines such as gabapentin or pregabalin. Opioid prescription pain medicines. These include: Hydrocodone. Oxycodone. Tramadol. Pain medicine may be prescribed to relieve most or all of your pain. It may not be possible to make all of your pain go away, but you should be comfortable enough to move, breathe, and do normal activities. How can pain medicines affect me? Pain medicines can cause side effects such as: Nausea. Vomiting. Abdominal pain. Opioid pain medicines can cause additional side effects, such as: Constipation. Drowsiness. Confusion. Difficulty breathing (respiratory depression). Dependence and addiction (opioid use disorder). Using opioid pain medicines for longer than 3 days increases your risk of these side effects. Taking opioid pain medicine for a long period of time can affect your ability to do daily tasks. It also puts you at risk for: Motor vehicle accidents. Depression. Suicide. Heart attack. If they are not taken correctly, non-opioid and opioid medicines may also put you at risk for: Liver problems. Kidney problems. Overdose. This is taking too much of the medicine. It can sometimes lead to death. What actions can I take to lower my risk of problems? Know your pain treatment plan To manage your pain successfully, you and your health care provider need to understand each other and work together. To do this: Discuss the goals of your treatment, including how much pain you might expect to have and how you will manage the pain. Ask your health care provider to refer you to one or more specialists who can help you manage pain in other ways.  Some other ways of managing pain include physical therapy, exercise, massage, or biofeedback. Review the risks and benefits of taking pain medicines for your condition. Be honest about the amount of medicines you take, and about any drugs or alcohol you use. Get pain medicine prescriptions from only one health care provider. Keep all follow-up visits. This is important. Take your medicine as directed  Take your pain medicine exactly as told by your health care provider. Take it only when you need it. If your pain gets less severe, you may take less than your prescribed dose if your health care provider approves. If you are not having pain, do nottake pain medicine unless your health care provider tells you to take it. If your pain medicine contains acetaminophen, do not take any other acetaminophen while taking this medicine. Acetaminophen is found in many over-the-counter and prescription medicines. An overdose of acetaminophen can result in severe liver damage. If your pain is severe, do nottry to treat it yourself by taking more pills than instructed on your prescription. Contact your health care provider for help. Write down the times when you take your pain medicine. It is easy to become confused while on pain medicine. Writing the time can help you avoid overdose. Take other over-the-counter or prescription medicines only as told by your health care provider. Restrict your activity as directed While you are taking prescription pain medicine, and for 8 hours after your last dose, follow these instructions: Do not drive. Do not use machinery or power tools. Do not sign legal documents. Do not drink alcohol. Do not take sleeping pills. Do not supervise children by yourself. Do not do activities that require climbing or being in high places. Do  not go to a lake, river, ocean, spa, or swimming pool unless an adult is nearby who can monitor and help you.  Keep pets and people safe Keep pain  medicine in a locked cabinet, or in a secure area where children and pets cannot reach it. Never share your prescription pain medicine with anyone. Do not save any leftover pills. If you have leftover medicine, you can: Bring the medicine to a prescription take-back program. This is usually offered by the county or Event organiser. Bring it to a pharmacy that has a drug disposal container. Throw it out in the trash. Check the label or package insert of your medicine to see whether this is safe to do. If it is safe to throw it out, remove the medicine from the container and mix it with material that makes it unusable, such as pet waste, before putting it in the trash. Flush it down the toilet only if this is safe to do. To find out, check the label or package insert of your medicine. Information is also available at the [place] Food and Drug Administration website: TruckOr.si. Treat or prevent constipation Taking pain medicines increases your risk for constipation. To prevent or treat this problem: Drink enough water to keep your urine pale yellow. Take over-the-counter or prescription medicines. You may need to take stool softeners. Eat foods that are high in fiber, such as beans, whole grains, and fresh fruits and vegetables. Limit foods that are high in fat and processed sugars, such as fried or sweet foods. Contact a health care provider if: Your medicine is not relieving your pain. You have a rash. You have nausea and vomiting. You feel depressed. Get help right away if: You have difficulty breathing. Your breathing is slower or more shallow than normal. You feel confused. You are too sleepy or you have difficulty staying awake. Your skin or lips turn pale or bluish in color. Your tongue swells. You have thoughts of harming yourself or harming others. These symptoms may represent a serious problem that is an emergency. Do not wait to see if the symptoms will go away. Get medical help right  away. Call your local emergency services (911 in the [place] Do not drive yourself to the hospital. If you ever feel like you may hurt yourself or others, or have thoughts about taking your own life, get help right away. Go to your nearest emergency department or: Call your local emergency services (911 in the [place] Call the [facility] [phone] in the [place] Call a suicide crisis helpline, such as the [facility] at 9095968147 or 988 in the [place]. This is open 24 hours a day in the [place] Text the Crisis Text Line at 973-806-7518 (in the [place] Summary It is important to follow instructions from your health care provider when you are taking prescription pain medicines. Pain medicine can help reduce pain but may also cause side effects. Make sure you understand the risks and benefits of taking pain medicine for your condition. Take steps to lower your risk of problems by taking medicine exactly as told, restricting activity, keeping others safe, preventing constipation, and having a pain treatment plan. This information is not intended to replace advice given to you by your health care provider. Make sure you discuss any questions you have with your health care provider. Document Revised: 11/21/2020 Document Reviewed: 09/05/2020 Elsevier Patient Education  [place].

## 2021-11-26 ENCOUNTER — Inpatient Hospital Stay (HOSPITAL_BASED_OUTPATIENT_CLINIC_OR_DEPARTMENT_OTHER): Payer: No Typology Code available for payment source | Admitting: Nurse Practitioner

## 2021-11-26 DIAGNOSIS — R630 Anorexia: Secondary | ICD-10-CM

## 2021-11-26 DIAGNOSIS — N1832 Chronic kidney disease, stage 3b: Secondary | ICD-10-CM

## 2021-11-26 DIAGNOSIS — R350 Frequency of micturition: Secondary | ICD-10-CM

## 2021-11-26 DIAGNOSIS — R0609 Other forms of dyspnea: Secondary | ICD-10-CM | POA: Diagnosis not present

## 2021-11-26 DIAGNOSIS — R590 Localized enlarged lymph nodes: Secondary | ICD-10-CM

## 2021-11-26 DIAGNOSIS — K449 Diaphragmatic hernia without obstruction or gangrene: Secondary | ICD-10-CM

## 2021-11-26 DIAGNOSIS — Z800 Family history of malignant neoplasm of digestive organs: Secondary | ICD-10-CM

## 2021-11-26 DIAGNOSIS — Z8249 Family history of ischemic heart disease and other diseases of the circulatory system: Secondary | ICD-10-CM

## 2021-11-26 DIAGNOSIS — Z8619 Personal history of other infectious and parasitic diseases: Secondary | ICD-10-CM

## 2021-11-26 DIAGNOSIS — R109 Unspecified abdominal pain: Secondary | ICD-10-CM

## 2021-11-26 DIAGNOSIS — R682 Dry mouth, unspecified: Secondary | ICD-10-CM

## 2021-11-26 DIAGNOSIS — K7469 Other cirrhosis of liver: Secondary | ICD-10-CM

## 2021-11-26 DIAGNOSIS — Z5112 Encounter for antineoplastic immunotherapy: Secondary | ICD-10-CM | POA: Diagnosis not present

## 2021-11-26 DIAGNOSIS — D649 Anemia, unspecified: Secondary | ICD-10-CM

## 2021-11-26 DIAGNOSIS — Z820 Family history of epilepsy and other diseases of the nervous system: Secondary | ICD-10-CM

## 2021-11-26 DIAGNOSIS — R634 Abnormal weight loss: Secondary | ICD-10-CM | POA: Diagnosis not present

## 2021-11-26 DIAGNOSIS — Z515 Encounter for palliative care: Secondary | ICD-10-CM

## 2021-11-26 DIAGNOSIS — C220 Liver cell carcinoma: Secondary | ICD-10-CM | POA: Diagnosis not present

## 2021-11-26 DIAGNOSIS — Z7189 Other specified counseling: Secondary | ICD-10-CM

## 2021-11-26 DIAGNOSIS — Z79899 Other long term (current) drug therapy: Secondary | ICD-10-CM

## 2021-11-26 DIAGNOSIS — I129 Hypertensive chronic kidney disease with stage 1 through stage 4 chronic kidney disease, or unspecified chronic kidney disease: Secondary | ICD-10-CM

## 2021-11-26 DIAGNOSIS — G893 Neoplasm related pain (acute) (chronic): Secondary | ICD-10-CM

## 2021-11-26 DIAGNOSIS — Z7901 Long term (current) use of anticoagulants: Secondary | ICD-10-CM

## 2021-11-26 DIAGNOSIS — I8510 Secondary esophageal varices without bleeding: Secondary | ICD-10-CM

## 2021-11-26 DIAGNOSIS — N433 Hydrocele, unspecified: Secondary | ICD-10-CM

## 2021-11-26 DIAGNOSIS — I868 Varicose veins of other specified sites: Secondary | ICD-10-CM

## 2021-11-26 DIAGNOSIS — K2100 Gastro-esophageal reflux disease with esophagitis, without bleeding: Secondary | ICD-10-CM

## 2021-11-26 DIAGNOSIS — E1122 Type 2 diabetes mellitus with diabetic chronic kidney disease: Secondary | ICD-10-CM

## 2021-11-26 DIAGNOSIS — I81 Portal vein thrombosis: Secondary | ICD-10-CM

## 2021-11-26 MED ORDER — MORPHINE SULFATE ER 15 MG PO TBCR: 15.0000 mg | EXTENDED_RELEASE_TABLET | Freq: Two times a day (BID) | ORAL | 0 refills | Status: DC

## 2021-11-26 MED ORDER — POLYETHYLENE GLYCOL 3350 17 GM/SCOOP PO POWD: 1.0000 | Freq: Every day | ORAL | 0 refills | Status: DC
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
## 2021-11-27 ENCOUNTER — Other Ambulatory Visit: Payer: Self-pay | Admitting: Hematology

## 2021-11-27 ENCOUNTER — Encounter: Payer: Self-pay | Admitting: Hematology

## 2021-11-27 ENCOUNTER — Other Ambulatory Visit: Payer: Self-pay

## 2021-11-27 DIAGNOSIS — C220 Liver cell carcinoma: Secondary | ICD-10-CM

## 2021-11-27 DIAGNOSIS — Z510 Encounter for antineoplastic radiation therapy: Secondary | ICD-10-CM | POA: Diagnosis not present

## 2021-11-27 NOTE — Addendum Note (Signed)
Addended by: [name] on: 11/27/2021 10:43 AM   Modules accepted: Orders

## 2021-11-27 NOTE — Progress Notes (Signed)
START OFF PATHWAY REGIMEN - Other   [id number]:Atezolizumab 1,200 mg IV D1 + Bevacizumab 15 mg/kg IV D1 q21 Days:   A cycle is every 21 Days:     Atezolizumab      Bevacizumab-xxxx   **Always confirm dose/schedule in your pharmacy ordering system**  Patient Characteristics: Intent of Therapy: Non-Curative / Palliative Intent, Discussed with Patient 

## 2021-11-27 NOTE — Progress Notes (Signed)
The proposed treatment discussed in conference is for discussion purpose only and is not a binding recommendation.  The patients have not been physically examined, or presented with their treatment options.  Therefore, final treatment plans cannot be decided.  

## 2021-11-28 ENCOUNTER — Other Ambulatory Visit: Payer: Self-pay

## 2021-11-28 ENCOUNTER — Telehealth: Payer: Self-pay | Admitting: Hematology

## 2021-11-28 ENCOUNTER — Telehealth: Payer: Self-pay | Admitting: Oncology

## 2021-11-28 ENCOUNTER — Inpatient Hospital Stay: Payer: No Typology Code available for payment source

## 2021-11-28 ENCOUNTER — Ambulatory Visit
Admission: RE | Admit: 2021-11-28 | Discharge: 2021-11-28 | Disposition: A | Discharge status: Home / Self Care | Payer: No Typology Code available for payment source | Source: Ambulatory Visit | Attending: Radiation Oncology | Admitting: Radiation Oncology

## 2021-11-28 DIAGNOSIS — C220 Liver cell carcinoma: Secondary | ICD-10-CM

## 2021-11-28 DIAGNOSIS — Z5112 Encounter for antineoplastic immunotherapy: Secondary | ICD-10-CM | POA: Diagnosis not present

## 2021-11-28 DIAGNOSIS — Z510 Encounter for antineoplastic radiation therapy: Secondary | ICD-10-CM | POA: Diagnosis not present

## 2021-11-28 LAB — CBC WITH DIFFERENTIAL (CANCER CENTER ONLY)
Abs Immature Granulocytes: 0.05 10*3/uL (ref 0.00–0.07)
Basophils Absolute: 0 10*3/uL (ref 0.0–0.1)
Basophils Relative: 0 %
Eosinophils Absolute: 0 10*3/uL (ref 0.0–0.5)
Eosinophils Relative: 0 %
HCT: 27.5 % — ABNORMAL LOW (ref 39.0–52.0)
Hemoglobin: 8.7 g/dL — ABNORMAL LOW (ref 13.0–17.0)
Immature Granulocytes: 1 %
Lymphocytes Relative: 12 %
Lymphs Abs: 1.1 10*3/uL (ref 0.7–4.0)
MCH: 26 pg (ref 26.0–34.0)
MCHC: 31.6 g/dL (ref 30.0–36.0)
MCV: 82.1 fL (ref 80.0–100.0)
Monocytes Absolute: 0.7 10*3/uL (ref 0.1–1.0)
Monocytes Relative: 7 %
Neutro Abs: 7.4 10*3/uL (ref 1.7–7.7)
Neutrophils Relative %: 80 %
Platelet Count: 243 10*3/uL (ref 150–400)
RBC: 3.35 MIL/uL — ABNORMAL LOW (ref 4.22–5.81)
RDW: 16.5 % — ABNORMAL HIGH (ref 11.5–15.5)
WBC Count: 9.3 10*3/uL (ref 4.0–10.5)
nRBC: 0 % (ref 0.0–0.2)

## 2021-11-28 LAB — CMP (CANCER CENTER ONLY)
ALT: 29 U/L (ref 0–44)
AST: 91 U/L — ABNORMAL HIGH (ref 15–41)
Albumin: 3.4 g/dL — ABNORMAL LOW (ref 3.5–5.0)
Alkaline Phosphatase: 258 U/L — ABNORMAL HIGH (ref 38–126)
Anion gap: 10 (ref 5–15)
BUN: 23 mg/dL (ref 8–23)
CO2: 24 mmol/L (ref 22–32)
Calcium: 9.7 mg/dL (ref 8.9–10.3)
Chloride: 102 mmol/L (ref 98–111)
Creatinine: 1.26 mg/dL — ABNORMAL HIGH (ref 0.61–1.24)
GFR, Estimated: >60 mL/min (ref >60)
Glucose, Bld: 224 mg/dL — ABNORMAL HIGH (ref 70–99)
Potassium: 4.6 mmol/L (ref 3.5–5.1)
Sodium: 136 mmol/L (ref 135–145)
Total Bilirubin: 0.7 mg/dL (ref 0.3–1.2)
Total Protein: 9 g/dL — ABNORMAL HIGH (ref 6.5–8.1)

## 2021-11-28 LAB — RAD ONC ARIA SESSION SUMMARY
Course Elapsed Days: 0
Plan Fractions Treated to Date: 1
Plan Prescribed Dose Per Fraction: 2.5 Gy
Plan Total Fractions Prescribed: 14
Plan Total Prescribed Dose: 35 Gy
Reference Point Dosage Given to Date: 2.5 Gy
Reference Point Session Dosage Given: 2.5 Gy
Session Number: 1

## 2021-11-28 LAB — TOTAL PROTEIN, URINE DIPSTICK: Protein, ur: 100 mg/dL — AB

## 2021-11-28 NOTE — Progress Notes (Signed)
Ir referral for directed liver therapy, demographics, insurance information, med/onc and rad/onc consult notes faxed to Home: [place].

## 2021-11-28 NOTE — Addendum Note (Signed)
Addended by: Ihor Gully A on: 11/28/2021 10:48 AM   Modules accepted: Orders

## 2021-11-28 NOTE — Progress Notes (Signed)
Pt here for patient teaching.  Pt given Radiation and You booklet, skin care instructions, and Sonafine.  Reviewed areas of pertinence such as diarrhea, fatigue, hair loss, nausea and vomiting, skin changes, and urinary and bladder changes . Pt able to give teach back of to pat skin, use unscented/gentle soap, use baby wipes, have Imodium on hand, drink plenty of water, and sitz bath,apply Sonafine bid and avoid applying anything to skin within 4 hours of treatment. Pt verbalizes understanding of information given and will contact nursing with any questions or concerns.    Denzil Mceachron M. Xayvier Vallez RN, BSN  

## 2021-11-28 NOTE — Telephone Encounter (Signed)
.  Called patient to schedule appointment per 7/20 inbasket, left pt msg

## 2021-11-28 NOTE — Addendum Note (Signed)
Addended by: Ihor Gully A on: 11/28/2021 09:53 AM   Modules accepted: Orders

## 2021-11-28 NOTE — Telephone Encounter (Signed)
error 

## 2021-11-29 ENCOUNTER — Ambulatory Visit
Admission: RE | Admit: 2021-11-29 | Discharge: 2021-11-29 | Disposition: A | Discharge status: Home / Self Care | Payer: No Typology Code available for payment source | Source: Ambulatory Visit | Attending: Radiation Oncology | Admitting: Radiation Oncology

## 2021-11-29 ENCOUNTER — Other Ambulatory Visit: Payer: Self-pay

## 2021-11-29 ENCOUNTER — Telehealth: Payer: Self-pay

## 2021-11-29 DIAGNOSIS — Z510 Encounter for antineoplastic radiation therapy: Secondary | ICD-10-CM | POA: Diagnosis not present

## 2021-11-29 DIAGNOSIS — C220 Liver cell carcinoma: Secondary | ICD-10-CM

## 2021-11-29 LAB — RAD ONC ARIA SESSION SUMMARY
Course Elapsed Days: 1
Plan Fractions Treated to Date: 2
Plan Prescribed Dose Per Fraction: 2.5 Gy
Plan Total Fractions Prescribed: 14
Plan Total Prescribed Dose: 35 Gy
Reference Point Dosage Given to Date: 5 Gy
Reference Point Session Dosage Given: 2.5 Gy
Session Number: 2

## 2021-11-29 LAB — TSH: TSH: 2.933 u[IU]/mL (ref 0.350–4.500)

## 2021-11-29 LAB — T4: T4, Total: 6.7 ug/dL (ref 4.5–12.0)

## 2021-11-29 MED ORDER — SONAFINE EX EMUL
1.0000 | Freq: Once | CUTANEOUS | Status: AC
  Administered 2021-11-29: 1 via TOPICAL

## 2021-11-29 NOTE — Telephone Encounter (Signed)
Called pt to check in , pt reports pain in his abdomen. Pt also reports that he has not started taking his pain medications. Pt and wife educated on why and how to take pain meds, no further questions or concerns. Verbalized understanding.

## 2021-11-29 NOTE — Progress Notes (Signed)
Pharmacist Chemotherapy Monitoring - Initial Assessment    Anticipated start date: 12/06/21   The following has been reviewed per standard work regarding the patient's treatment regimen: The patient's diagnosis, treatment plan and drug doses, and organ/hematologic function Lab orders and baseline tests specific to treatment regimen  The treatment plan start date, drug sequencing, and pre-medications Prior authorization status  Patient's documented medication list, including drug-drug interaction screen and prescriptions for anti-emetics and supportive care specific to the treatment regimen The drug concentrations, fluid compatibility, administration routes, and timing of the medications to be used The patient's access for treatment and lifetime cumulative dose history, if applicable  The patient's medication allergies and previous infusion related reactions, if applicable   Changes made to treatment plan:  N/A  Follow up needed:  N/A   Philomena Course, RPH, 11/29/2021  12:16 PM

## 2021-12-02 ENCOUNTER — Other Ambulatory Visit: Payer: Self-pay

## 2021-12-02 ENCOUNTER — Ambulatory Visit
Admission: RE | Admit: 2021-12-02 | Discharge: 2021-12-02 | Disposition: A | Discharge status: Home / Self Care | Payer: No Typology Code available for payment source | Source: Ambulatory Visit | Attending: Radiation Oncology | Admitting: Radiation Oncology

## 2021-12-02 ENCOUNTER — Encounter: Payer: Self-pay | Admitting: Radiation Oncology

## 2021-12-02 ENCOUNTER — Other Ambulatory Visit: Payer: Self-pay | Admitting: Radiation Oncology

## 2021-12-02 DIAGNOSIS — C220 Liver cell carcinoma: Secondary | ICD-10-CM

## 2021-12-02 DIAGNOSIS — Z510 Encounter for antineoplastic radiation therapy: Secondary | ICD-10-CM | POA: Diagnosis not present

## 2021-12-02 LAB — RAD ONC ARIA SESSION SUMMARY
Course Elapsed Days: 4
Plan Fractions Treated to Date: 3
Plan Prescribed Dose Per Fraction: 2.5 Gy
Plan Total Fractions Prescribed: 14
Plan Total Prescribed Dose: 35 Gy
Reference Point Dosage Given to Date: 7.5 Gy
Reference Point Session Dosage Given: 2.5 Gy
Session Number: 3

## 2021-12-02 NOTE — Progress Notes (Signed)
The patient came to the clinic today, we are quite sure exactly what his most concerning symptom was but he describes some difficulty with dry mouth, especially when brushing his teeth he feels somewhat nauseated and can have episodes of regurgitation.  He continues to lose weight and has had poor oral intake and appetite.  His blood sugars continue to be erratic.  He has been trying to drink Ensure but this drives his sugar up.  He has not made any recent changes per report regarding his insulin dosing.  I reviewed with him his imaging results, and the rationale for why we are treating his liver, of note he has a very large hepatocellular carcinoma that is not amenable to surgical resection and is currently being treated with palliative radiotherapy.  We discussed the changes in his anatomy as a result of this large tumor and I suspect his poor oral intake has somewhat to do with how much of his abdomen is being taken up by his liver because of his cancer.  He reports that he has not met with nutrition.  He does have access to nausea medication and pain medication. I also encouraged him to check in with whoever manages his insulin so that he can have tighter control of his glucose levels without significant elevations or depressions in these values.  Hopefully with radiotherapy this will improve some of his symptoms with response to therapy

## 2021-12-03 ENCOUNTER — Other Ambulatory Visit: Payer: Self-pay

## 2021-12-03 ENCOUNTER — Encounter: Payer: Self-pay | Admitting: Hematology

## 2021-12-03 ENCOUNTER — Ambulatory Visit
Admission: RE | Admit: 2021-12-03 | Discharge: 2021-12-03 | Disposition: A | Discharge status: Home / Self Care | Payer: No Typology Code available for payment source | Source: Ambulatory Visit | Attending: Radiation Oncology | Admitting: Radiation Oncology

## 2021-12-03 DIAGNOSIS — Z510 Encounter for antineoplastic radiation therapy: Secondary | ICD-10-CM | POA: Diagnosis not present

## 2021-12-03 LAB — RAD ONC ARIA SESSION SUMMARY
Course Elapsed Days: 5
Plan Fractions Treated to Date: 4
Plan Prescribed Dose Per Fraction: 2.5 Gy
Plan Total Fractions Prescribed: 14
Plan Total Prescribed Dose: 35 Gy
Reference Point Dosage Given to Date: 10 Gy
Reference Point Session Dosage Given: 2.5 Gy
Session Number: 4

## 2021-12-03 MED ORDER — ONDANSETRON HCL 8 MG PO TABS: 8.0000 mg | ORAL_TABLET | Freq: Two times a day (BID) | ORAL | 1 refills | Status: DC | PRN

## 2021-12-03 MED ORDER — PROCHLORPERAZINE MALEATE 10 MG PO TABS: 10.0000 mg | ORAL_TABLET | Freq: Four times a day (QID) | ORAL | 1 refills | Status: DC | PRN

## 2021-12-04 ENCOUNTER — Telehealth: Payer: Self-pay | Admitting: *Deleted

## 2021-12-04 ENCOUNTER — Other Ambulatory Visit: Payer: Self-pay

## 2021-12-04 ENCOUNTER — Ambulatory Visit
Admission: RE | Admit: 2021-12-04 | Discharge: 2021-12-04 | Disposition: A | Discharge status: Home / Self Care | Payer: No Typology Code available for payment source | Source: Ambulatory Visit | Attending: Radiation Oncology | Admitting: Radiation Oncology

## 2021-12-04 ENCOUNTER — Inpatient Hospital Stay: Payer: No Typology Code available for payment source

## 2021-12-04 DIAGNOSIS — Z510 Encounter for antineoplastic radiation therapy: Secondary | ICD-10-CM | POA: Diagnosis not present

## 2021-12-04 LAB — RAD ONC ARIA SESSION SUMMARY
Course Elapsed Days: 6
Plan Fractions Treated to Date: 5
Plan Prescribed Dose Per Fraction: 2.5 Gy
Plan Total Fractions Prescribed: 14
Plan Total Prescribed Dose: 35 Gy
Reference Point Dosage Given to Date: 12.5 Gy
Reference Point Session Dosage Given: 2.5 Gy
Session Number: 5

## 2021-12-04 NOTE — Telephone Encounter (Signed)
Pt did not show for education appt.  Called & spoke with Troy [name] & he didn't remember this appt.  He asked that I call his wife who plans to come home from work to pick him up to bring him to radiation appt.  Called her cell # & unable to leave message b/c mail box is full.  Informed pt that if he can be here early we may be able to go over some of his education.

## 2021-12-04 NOTE — Progress Notes (Signed)
The following biosimilar Mvasi (bevacizumab-awwb) has been selected for use in this patient.  Kennith Center, Pharm.D., CPP 12/04/2021'@12'$ :58 PM
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
## 2021-12-05 ENCOUNTER — Other Ambulatory Visit: Payer: Self-pay

## 2021-12-05 ENCOUNTER — Ambulatory Visit
Admission: RE | Admit: 2021-12-05 | Discharge: 2021-12-05 | Disposition: A | Discharge status: Home / Self Care | Payer: No Typology Code available for payment source | Source: Ambulatory Visit | Attending: Radiation Oncology | Admitting: Radiation Oncology

## 2021-12-05 ENCOUNTER — Inpatient Hospital Stay: Payer: No Typology Code available for payment source | Admitting: Nurse Practitioner

## 2021-12-05 ENCOUNTER — Encounter: Payer: Self-pay | Admitting: Hematology

## 2021-12-05 ENCOUNTER — Inpatient Hospital Stay: Payer: No Typology Code available for payment source

## 2021-12-05 ENCOUNTER — Inpatient Hospital Stay (HOSPITAL_BASED_OUTPATIENT_CLINIC_OR_DEPARTMENT_OTHER): Payer: No Typology Code available for payment source | Admitting: Hematology

## 2021-12-05 VITALS — BP 139/76 | HR 112 | Temp 97.4°F | Resp 18 | Ht 70.0 in | Wt 138.8 lb

## 2021-12-05 DIAGNOSIS — C220 Liver cell carcinoma: Secondary | ICD-10-CM

## 2021-12-05 DIAGNOSIS — Z5112 Encounter for antineoplastic immunotherapy: Secondary | ICD-10-CM | POA: Diagnosis not present

## 2021-12-05 DIAGNOSIS — Z510 Encounter for antineoplastic radiation therapy: Secondary | ICD-10-CM | POA: Diagnosis not present

## 2021-12-05 LAB — RAD ONC ARIA SESSION SUMMARY
Course Elapsed Days: 7
Plan Fractions Treated to Date: 6
Plan Prescribed Dose Per Fraction: 2.5 Gy
Plan Total Fractions Prescribed: 14
Plan Total Prescribed Dose: 35 Gy
Reference Point Dosage Given to Date: 15 Gy
Reference Point Session Dosage Given: 2.5 Gy
Session Number: 6

## 2021-12-05 NOTE — Progress Notes (Signed)
I spoke with Troy [name] wife [name],  I reviewed tomorrows appt schedule.  They are able to see Dr Burr Medico today at 1420.  All questions were answered.  She verbalized understanding.
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
## 2021-12-05 NOTE — Progress Notes (Signed)
Pt late for lab appt.  I called all numbers in his medical record and no answer.

## 2021-12-06 ENCOUNTER — Inpatient Hospital Stay: Payer: No Typology Code available for payment source

## 2021-12-06 ENCOUNTER — Inpatient Hospital Stay (HOSPITAL_BASED_OUTPATIENT_CLINIC_OR_DEPARTMENT_OTHER): Payer: No Typology Code available for payment source | Admitting: Nurse Practitioner

## 2021-12-06 ENCOUNTER — Ambulatory Visit
Admission: RE | Admit: 2021-12-06 | Discharge: 2021-12-06 | Disposition: A | Discharge status: Home / Self Care | Payer: No Typology Code available for payment source | Source: Ambulatory Visit | Attending: Radiation Oncology | Admitting: Radiation Oncology

## 2021-12-06 ENCOUNTER — Other Ambulatory Visit: Payer: Self-pay

## 2021-12-06 ENCOUNTER — Encounter: Payer: Self-pay | Admitting: Nurse Practitioner

## 2021-12-06 ENCOUNTER — Other Ambulatory Visit: Payer: Self-pay | Admitting: *Deleted

## 2021-12-06 VITALS — BP 131/68 | HR 111 | Temp 98.0°F | Resp 16 | Wt 136.2 lb

## 2021-12-06 VITALS — BP 130/66 | HR 102 | Temp 98.6°F | Resp 16

## 2021-12-06 DIAGNOSIS — G893 Neoplasm related pain (acute) (chronic): Secondary | ICD-10-CM

## 2021-12-06 DIAGNOSIS — Z515 Encounter for palliative care: Secondary | ICD-10-CM | POA: Diagnosis not present

## 2021-12-06 DIAGNOSIS — R634 Abnormal weight loss: Secondary | ICD-10-CM

## 2021-12-06 DIAGNOSIS — K5900 Constipation, unspecified: Secondary | ICD-10-CM | POA: Diagnosis not present

## 2021-12-06 DIAGNOSIS — C220 Liver cell carcinoma: Secondary | ICD-10-CM

## 2021-12-06 DIAGNOSIS — Z510 Encounter for antineoplastic radiation therapy: Secondary | ICD-10-CM | POA: Diagnosis not present

## 2021-12-06 DIAGNOSIS — Z5112 Encounter for antineoplastic immunotherapy: Secondary | ICD-10-CM | POA: Diagnosis not present

## 2021-12-06 DIAGNOSIS — Z7189 Other specified counseling: Secondary | ICD-10-CM

## 2021-12-06 DIAGNOSIS — R530 Neoplastic (malignant) related fatigue: Secondary | ICD-10-CM

## 2021-12-06 DIAGNOSIS — R630 Anorexia: Secondary | ICD-10-CM

## 2021-12-06 LAB — CMP (CANCER CENTER ONLY)
ALT: 22 U/L (ref 0–44)
AST: 85 U/L — ABNORMAL HIGH (ref 15–41)
Albumin: 2.9 g/dL — ABNORMAL LOW (ref 3.5–5.0)
Alkaline Phosphatase: 274 U/L — ABNORMAL HIGH (ref 38–126)
Anion gap: 12 (ref 5–15)
BUN: 35 mg/dL — ABNORMAL HIGH (ref 8–23)
CO2: 23 mmol/L (ref 22–32)
Calcium: 9.2 mg/dL (ref 8.9–10.3)
Chloride: 104 mmol/L (ref 98–111)
Creatinine: 1.11 mg/dL (ref 0.61–1.24)
GFR, Estimated: >60 mL/min (ref >60)
Glucose, Bld: 156 mg/dL — ABNORMAL HIGH (ref 70–99)
Potassium: 4.4 mmol/L (ref 3.5–5.1)
Sodium: 139 mmol/L (ref 135–145)
Total Bilirubin: 0.9 mg/dL (ref 0.3–1.2)
Total Protein: 8.2 g/dL — ABNORMAL HIGH (ref 6.5–8.1)

## 2021-12-06 LAB — TOTAL PROTEIN, URINE DIPSTICK: Protein, ur: <30 mg/dL — AB

## 2021-12-06 LAB — CBC WITH DIFFERENTIAL (CANCER CENTER ONLY)
Abs Immature Granulocytes: 0.07 10*3/uL (ref 0.00–0.07)
Basophils Absolute: 0 10*3/uL (ref 0.0–0.1)
Basophils Relative: 0 %
Eosinophils Absolute: 0 10*3/uL (ref 0.0–0.5)
Eosinophils Relative: 0 %
HCT: 24.6 % — ABNORMAL LOW (ref 39.0–52.0)
Hemoglobin: 7.9 g/dL — ABNORMAL LOW (ref 13.0–17.0)
Immature Granulocytes: 1 %
Lymphocytes Relative: 3 %
Lymphs Abs: 0.2 10*3/uL — ABNORMAL LOW (ref 0.7–4.0)
MCH: 26.4 pg (ref 26.0–34.0)
MCHC: 32.1 g/dL (ref 30.0–36.0)
MCV: 82.3 fL (ref 80.0–100.0)
Monocytes Absolute: 0.5 10*3/uL (ref 0.1–1.0)
Monocytes Relative: 8 %
Neutro Abs: 5.3 10*3/uL (ref 1.7–7.7)
Neutrophils Relative %: 88 %
Platelet Count: 241 10*3/uL (ref 150–400)
RBC: 2.99 MIL/uL — ABNORMAL LOW (ref 4.22–5.81)
RDW: 17.4 % — ABNORMAL HIGH (ref 11.5–15.5)
WBC Count: 6.1 10*3/uL (ref 4.0–10.5)
nRBC: 0 % (ref 0.0–0.2)

## 2021-12-06 LAB — RAD ONC ARIA SESSION SUMMARY
Course Elapsed Days: 8
Plan Fractions Treated to Date: 7
Plan Prescribed Dose Per Fraction: 2.5 Gy
Plan Total Fractions Prescribed: 14
Plan Total Prescribed Dose: 35 Gy
Reference Point Dosage Given to Date: 17.5 Gy
Reference Point Session Dosage Given: 2.5 Gy
Session Number: 7

## 2021-12-06 LAB — TSH: TSH: 1.515 u[IU]/mL (ref 0.350–4.500)

## 2021-12-06 MED ORDER — MORPHINE SULFATE 20 MG/5ML PO SOLN: 2.5000 mg | ORAL | 0 refills | Status: DC | PRN

## 2021-12-06 MED ORDER — SODIUM CHLORIDE 0.9 % IV SOLN
15.0000 mg/kg | Freq: Once | INTRAVENOUS | Status: AC
  Administered 2021-12-06: 1000 mg via INTRAVENOUS
  Filled 2021-12-06: qty 32

## 2021-12-06 MED ORDER — SODIUM CHLORIDE 0.9 % IV SOLN
1200.0000 mg | Freq: Once | INTRAVENOUS | Status: AC
  Administered 2021-12-06: 1200 mg via INTRAVENOUS
  Filled 2021-12-06: qty 20

## 2021-12-06 MED ORDER — ONDANSETRON HCL 8 MG PO TABS: 8.0000 mg | ORAL_TABLET | Freq: Three times a day (TID) | ORAL | 0 refills | Status: DC | PRN

## 2021-12-06 MED ORDER — SODIUM CHLORIDE 0.9 % IV SOLN: Freq: Once | INTRAVENOUS | Status: AC

## 2021-12-06 MED ORDER — MORPHINE SULFATE (PF) 2 MG/ML IV SOLN
2.0000 mg | Freq: Once | INTRAVENOUS | Status: AC
  Administered 2021-12-06: 2 mg via INTRAVENOUS
  Filled 2021-12-06: qty 1

## 2021-12-06 NOTE — Progress Notes (Signed)
Iv pain meds placed in signed and held for infusion

## 2021-12-06 NOTE — Progress Notes (Signed)
Okay to treat with Hgb 7.9 and pulse 108 per Dr. Burr Medico

## 2021-12-06 NOTE — Patient Instructions (Signed)
[facility]  Discharge Instructions: Thank you for choosing [facility] to provide your oncology and hematology care.   If you have a lab appointment with the [facility], please go directly to the [facility] and check in at the registration area.   Wear comfortable clothing and clothing appropriate for easy access to any Portacath or PICC line.   We strive to give you quality time with your provider. You may need to reschedule your appointment if you arrive late (15 or more minutes).  Arriving late affects you and other patients whose appointments are after yours.  Also, if you miss three or more appointments without notifying the office, you may be dismissed from the clinic at the provider's discretion.      For prescription refill requests, have your pharmacy contact our office and allow 72 hours for refills to be completed.    Today you received the following chemotherapy and/or immunotherapy agents : Tecentriq, Bevacizumab      To help prevent nausea and vomiting after your treatment, we encourage you to take your nausea medication as directed.  BELOW ARE SYMPTOMS THAT SHOULD BE REPORTED IMMEDIATELY: *FEVER GREATER THAN 100.4 F (38 C) OR HIGHER *CHILLS OR SWEATING *NAUSEA AND VOMITING THAT IS NOT CONTROLLED WITH YOUR NAUSEA MEDICATION *UNUSUAL SHORTNESS OF BREATH *UNUSUAL BRUISING OR BLEEDING *URINARY PROBLEMS (pain or burning when urinating, or frequent urination) *BOWEL PROBLEMS (unusual diarrhea, constipation, pain near the anus) TENDERNESS IN MOUTH AND THROAT WITH OR WITHOUT PRESENCE OF ULCERS (sore throat, sores in mouth, or a toothache) UNUSUAL RASH, SWELLING OR PAIN  UNUSUAL VAGINAL DISCHARGE OR ITCHING   Items with * indicate a potential emergency and should be followed up as soon as possible or go to the Emergency Department if any problems should occur.  Please show the CHEMOTHERAPY ALERT CARD or IMMUNOTHERAPY ALERT CARD  at check-in to the Emergency Department and triage nurse.  Should you have questions after your visit or need to cancel or reschedule your appointment, please contact [facility]  Dept: [phone]  and follow the prompts.  Office hours are 8:00 a.m. to 4:30 p.m. Monday - Friday. Please note that voicemails left after 4:00 p.m. may not be returned until the following business day.  We are closed weekends and major holidays. You have access to a nurse at all times for urgent questions. Please call the main number to the clinic Dept: [phone] and follow the prompts.   For any non-urgent questions, you may also contact your provider using MyChart. We now offer e-Visits for anyone 57 and older to request care online for non-urgent symptoms. For details visit [email or web address].   Also download the MyChart app! Go to the app store, search "MyChart", open the app, select [facility], and log in with your MyChart username and password.  Masks are optional in the cancer centers. If you would like for your care team to wear a mask while they are taking care of you, please let them know. For doctor visits, patients may have with them one support person who is at least 68 years old. At this time, visitors are not allowed in the infusion area.  Atezolizumab injection What is this medication? ATEZOLIZUMAB (a te zoe LIZ ue mab) is a monoclonal antibody. It is used to treat bladder cancer (urothelial cancer), liver cancer, lung cancer, and melanoma. This medicine may be used for other purposes; ask your health care provider or pharmacist if  you have questions. COMMON BRAND NAME(S): Tecentriq What should I tell my care team before I take this medication? They need to know if you have any of these conditions: autoimmune diseases like Crohn's disease, ulcerative colitis, or lupus have had or planning to have an allogeneic stem cell transplant (uses someone else's stem  cells) history of organ transplant history of radiation to the chest nervous system problems like myasthenia gravis or Guillain-Barre syndrome an unusual or allergic reaction to atezolizumab, other medicines, foods, dyes, or preservatives pregnant or trying to get pregnant breast-feeding How should I use this medication? This medicine is for infusion into a vein. It is given by a health care professional in a hospital or clinic setting. A special MedGuide will be given to you before each treatment. Be sure to read this information carefully each time. Talk to your pediatrician regarding the use of this medicine in children. Special care may be needed. Overdosage: If you think you have taken too much of this medicine contact a poison control center or emergency room at once. NOTE: This medicine is only for you. Do not share this medicine with others. What if I miss a dose? It is important not to miss your dose. Call your doctor or health care professional if you are unable to keep an appointment. What may interact with this medication? Interactions have not been studied. This list may not describe all possible interactions. Give your health care provider a list of all the medicines, herbs, non-prescription drugs, or dietary supplements you use. Also tell them if you smoke, drink alcohol, or use illegal drugs. Some items may interact with your medicine. What should I watch for while using this medication? Your condition will be monitored carefully while you are receiving this medicine. You may need blood work done while you are taking this medicine. Do not become pregnant while taking this medicine or for at least 5 months after stopping it. Women should inform their doctor if they wish to become pregnant or think they might be pregnant. There is a potential for serious side effects to an unborn child. Talk to your health care professional or pharmacist for more information. Do not breast-feed an  infant while taking this medicine or for at least 5 months after the last dose. What side effects may I notice from receiving this medication? Side effects that you should report to your doctor or health care professional as soon as possible: allergic reactions like skin rash, itching or hives, swelling of the face, lips, or tongue black, tarry stools bloody or watery diarrhea breathing problems changes in vision chest pain or chest tightness chills facial flushing fever headache signs and symptoms of high blood sugar such as dizziness; dry mouth; dry skin; fruity breath; nausea; stomach pain; increased hunger or thirst; increased urination signs and symptoms of liver injury like dark yellow or brown urine; general ill feeling or flu-like symptoms; light-colored stools; loss of appetite; nausea; right upper belly pain; unusually weak or tired; yellowing of the eyes or skin stomach pain trouble passing urine or change in the amount of urine Side effects that usually do not require medical attention (report to your doctor or health care professional if they continue or are bothersome): bone pain cough diarrhea joint pain muscle pain muscle weakness swelling of arms or legs tiredness weight loss This list may not describe all possible side effects. Call your doctor for medical advice about side effects. You may report side effects to FDA at 1-800-FDA-1088. Where  should I keep my medication? This drug is given in a hospital or clinic and will not be stored at home. NOTE: This sheet is a summary. It may not cover all possible information. If you have questions about this medicine, talk to your doctor, pharmacist, or health care provider.  2023 Elsevier/Gold Standard (2021-03-29 00:00:00) Bevacizumab injection What is this medication? BEVACIZUMAB (be va SIZ yoo mab) is a monoclonal antibody. It is used to treat many types of cancer. This medicine may be used for other purposes; ask your  health care provider or pharmacist if you have questions. COMMON BRAND NAME(S): Alymsys, Avastin, MVASI, Noah Charon What should I tell my care team before I take this medication? They need to know if you have any of these conditions: diabetes heart disease high blood pressure history of coughing up blood prior anthracycline chemotherapy (e.g., doxorubicin, daunorubicin, epirubicin) recent or ongoing radiation therapy recent or planning to have surgery stroke an unusual or allergic reaction to bevacizumab, hamster proteins, mouse proteins, other medicines, foods, dyes, or preservatives pregnant or trying to get pregnant breast-feeding How should I use this medication? This medicine is for infusion into a vein. It is given by a health care professional in a hospital or clinic setting. Talk to your pediatrician regarding the use of this medicine in children. Special care may be needed. Overdosage: If you think you have taken too much of this medicine contact a poison control center or emergency room at once. NOTE: This medicine is only for you. Do not share this medicine with others. What if I miss a dose? It is important not to miss your dose. Call your doctor or health care professional if you are unable to keep an appointment. What may interact with this medication? Interactions are not expected. This list may not describe all possible interactions. Give your health care provider a list of all the medicines, herbs, non-prescription drugs, or dietary supplements you use. Also tell them if you smoke, drink alcohol, or use illegal drugs. Some items may interact with your medicine. What should I watch for while using this medication? Your condition will be monitored carefully while you are receiving this medicine. You will need important blood work and urine testing done while you are taking this medicine. This medicine may increase your risk to bruise or bleed. Call your doctor or health care  professional if you notice any unusual bleeding. Before having surgery, talk to your health care provider to make sure it is ok. This drug can increase the risk of poor healing of your surgical site or wound. You will need to stop this drug for 28 days before surgery. After surgery, wait at least 28 days before restarting this drug. Make sure the surgical site or wound is healed enough before restarting this drug. Talk to your health care provider if questions. Do not become pregnant while taking this medicine or for 6 months after stopping it. Women should inform their doctor if they wish to become pregnant or think they might be pregnant. There is a potential for serious side effects to an unborn child. Talk to your health care professional or pharmacist for more information. Do not breast-feed an infant while taking this medicine and for 6 months after the last dose. This medicine has caused ovarian failure in some women. This medicine may interfere with the ability to have a child. You should talk to your doctor or health care professional if you are concerned about your fertility. What side effects may  I notice from receiving this medication? Side effects that you should report to your doctor or health care professional as soon as possible: allergic reactions like skin rash, itching or hives, swelling of the face, lips, or tongue chest pain or chest tightness chills coughing up blood high fever seizures severe constipation signs and symptoms of bleeding such as bloody or black, tarry stools; red or dark-brown urine; spitting up blood or brown material that looks like coffee grounds; red spots on the skin; unusual bruising or bleeding from the eye, gums, or nose signs and symptoms of a blood clot such as breathing problems; chest pain; severe, sudden headache; pain, swelling, warmth in the leg signs and symptoms of a stroke like changes in vision; confusion; trouble speaking or understanding;  severe headaches; sudden numbness or weakness of the face, arm or leg; trouble walking; dizziness; loss of balance or coordination stomach pain sweating swelling of legs or ankles vomiting weight gain Side effects that usually do not require medical attention (report to your doctor or health care professional if they continue or are bothersome): back pain changes in taste decreased appetite dry skin nausea tiredness This list may not describe all possible side effects. Call your doctor for medical advice about side effects. You may report side effects to FDA at 1-800-FDA-1088. Where should I keep my medication? This drug is given in a hospital or clinic and will not be stored at home. NOTE: This sheet is a summary. It may not cover all possible information. If you have questions about this medicine, talk to your doctor, pharmacist, or health care provider.  2023 Elsevier/Gold Standard (2021-03-29 00:00:00)

## 2021-12-07 ENCOUNTER — Other Ambulatory Visit: Payer: Self-pay

## 2021-12-07 LAB — T4: T4, Total: 5.3 ug/dL (ref 4.5–12.0)

## 2021-12-08 ENCOUNTER — Encounter: Payer: Self-pay | Admitting: Hematology

## 2021-12-08 ENCOUNTER — Other Ambulatory Visit: Payer: Self-pay
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
## 2021-12-09 ENCOUNTER — Ambulatory Visit
Admission: RE | Admit: 2021-12-09 | Discharge: 2021-12-09 | Disposition: A | Discharge status: Home / Self Care | Payer: No Typology Code available for payment source | Source: Ambulatory Visit | Attending: Radiation Oncology | Admitting: Radiation Oncology

## 2021-12-09 ENCOUNTER — Other Ambulatory Visit: Payer: Self-pay

## 2021-12-09 ENCOUNTER — Encounter: Payer: Self-pay | Admitting: Hematology

## 2021-12-09 DIAGNOSIS — Z510 Encounter for antineoplastic radiation therapy: Secondary | ICD-10-CM | POA: Diagnosis not present

## 2021-12-09 LAB — RAD ONC ARIA SESSION SUMMARY
Course Elapsed Days: 11
Plan Fractions Treated to Date: 8
Plan Prescribed Dose Per Fraction: 2.5 Gy
Plan Total Fractions Prescribed: 14
Plan Total Prescribed Dose: 35 Gy
Reference Point Dosage Given to Date: 20 Gy
Reference Point Session Dosage Given: 2.5 Gy
Session Number: 8

## 2021-12-10 ENCOUNTER — Other Ambulatory Visit: Payer: Self-pay | Admitting: Nurse Practitioner

## 2021-12-10 ENCOUNTER — Inpatient Hospital Stay: Payer: No Typology Code available for payment source | Attending: Hematology | Admitting: Nutrition

## 2021-12-10 ENCOUNTER — Ambulatory Visit
Admission: RE | Admit: 2021-12-10 | Discharge: 2021-12-10 | Disposition: A | Discharge status: Home / Self Care | Payer: No Typology Code available for payment source | Source: Ambulatory Visit | Attending: Radiation Oncology | Admitting: Radiation Oncology

## 2021-12-10 ENCOUNTER — Other Ambulatory Visit: Payer: Self-pay

## 2021-12-10 DIAGNOSIS — Z8619 Personal history of other infectious and parasitic diseases: Secondary | ICD-10-CM | POA: Insufficient documentation

## 2021-12-10 DIAGNOSIS — K3189 Other diseases of stomach and duodenum: Secondary | ICD-10-CM | POA: Insufficient documentation

## 2021-12-10 DIAGNOSIS — Z923 Personal history of irradiation: Secondary | ICD-10-CM | POA: Insufficient documentation

## 2021-12-10 DIAGNOSIS — D649 Anemia, unspecified: Secondary | ICD-10-CM | POA: Insufficient documentation

## 2021-12-10 DIAGNOSIS — Z510 Encounter for antineoplastic radiation therapy: Secondary | ICD-10-CM | POA: Insufficient documentation

## 2021-12-10 DIAGNOSIS — Z79899 Other long term (current) drug therapy: Secondary | ICD-10-CM | POA: Insufficient documentation

## 2021-12-10 DIAGNOSIS — R590 Localized enlarged lymph nodes: Secondary | ICD-10-CM | POA: Insufficient documentation

## 2021-12-10 DIAGNOSIS — C220 Liver cell carcinoma: Secondary | ICD-10-CM | POA: Diagnosis present

## 2021-12-10 DIAGNOSIS — Z7901 Long term (current) use of anticoagulants: Secondary | ICD-10-CM | POA: Insufficient documentation

## 2021-12-10 DIAGNOSIS — R0609 Other forms of dyspnea: Secondary | ICD-10-CM | POA: Insufficient documentation

## 2021-12-10 DIAGNOSIS — I8510 Secondary esophageal varices without bleeding: Secondary | ICD-10-CM | POA: Insufficient documentation

## 2021-12-10 DIAGNOSIS — R634 Abnormal weight loss: Secondary | ICD-10-CM | POA: Insufficient documentation

## 2021-12-10 DIAGNOSIS — Z5112 Encounter for antineoplastic immunotherapy: Secondary | ICD-10-CM | POA: Insufficient documentation

## 2021-12-10 DIAGNOSIS — K766 Portal hypertension: Secondary | ICD-10-CM | POA: Insufficient documentation

## 2021-12-10 DIAGNOSIS — Z79891 Long term (current) use of opiate analgesic: Secondary | ICD-10-CM | POA: Insufficient documentation

## 2021-12-10 DIAGNOSIS — N1832 Chronic kidney disease, stage 3b: Secondary | ICD-10-CM | POA: Insufficient documentation

## 2021-12-10 DIAGNOSIS — E1122 Type 2 diabetes mellitus with diabetic chronic kidney disease: Secondary | ICD-10-CM | POA: Insufficient documentation

## 2021-12-10 DIAGNOSIS — I129 Hypertensive chronic kidney disease with stage 1 through stage 4 chronic kidney disease, or unspecified chronic kidney disease: Secondary | ICD-10-CM | POA: Insufficient documentation

## 2021-12-10 DIAGNOSIS — R350 Frequency of micturition: Secondary | ICD-10-CM | POA: Insufficient documentation

## 2021-12-10 DIAGNOSIS — I81 Portal vein thrombosis: Secondary | ICD-10-CM | POA: Insufficient documentation

## 2021-12-10 LAB — RAD ONC ARIA SESSION SUMMARY
Course Elapsed Days: 12
Plan Fractions Treated to Date: 9
Plan Prescribed Dose Per Fraction: 2.5 Gy
Plan Total Fractions Prescribed: 14
Plan Total Prescribed Dose: 35 Gy
Reference Point Dosage Given to Date: 22.5 Gy
Reference Point Session Dosage Given: 2.5 Gy
Session Number: 9

## 2021-12-10 MED ORDER — MIRTAZAPINE 7.5 MG PO TABS: 7.5000 mg | ORAL_TABLET | Freq: Every day | ORAL | 1 refills | Status: DC

## 2021-12-10 MED ORDER — DEXLANSOPRAZOLE 30 MG PO CPDR: 30.0000 mg | DELAYED_RELEASE_CAPSULE | Freq: Every day | ORAL | 1 refills | Status: DC

## 2021-12-10 NOTE — Progress Notes (Signed)
66 year old male diagnosed with hepatocellular cancer which is unresectable.  He is followed by Dr. Burr Medico.  He is receiving Atezolizumab + Bevacizumab q21d Maintenance.  He also is receiving radiation therapy.  Past medical history includes cirrhosis, hepatitis C, chronic kidney disease stage III, diabetes type 2, hyperlipidemia, hypertension, GERD, depression, and cocaine.  Medications include renal multivitamin, Jardiance, Lantus, metformin, Zofran, MiraLAX, Compazine.   MD added Remeron today.  Labs include glucose 156, albumin 2.9 and BUN 35 on July 28.  Height: 5 feet 10 inches. Weight: 130.8 pounds. Usual body weight: 170-173 pounds. BMI: 18.77.  Patient reports he has a very dry mouth and describes nausea, poor appetite, and taste alterations.  Patient reports he was diagnosed with thrush.  All of these have reduced his oral intake.  He has had a 33% weight loss from usual body weight.  Reports he tolerates equate plus and is drinking 4-5 cartons daily.  He also consumes beef broth, soup, and water.  Nutrition diagnosis: Unintended weight loss related to hepatocellular cancer and associated treatments as evidenced by 33% weight loss from usual body weight.  Intervention: Educated patient on strategies for improving appetite and encouraged him to try to consume high-calorie, high-protein foods 6 times daily. Educated on strategies for improving taste alterations. Encouraged patient to continue oral nutrition supplements 4-6 cartons daily as tolerated.  Provided additional samples and coupons. Reviewed soft moist foods and encourage patient to try to incorporate more food at meals. Encouraged baking soda and salt water rinses. Nutrition fact sheets provided.  Questions answered.  Contact information given.  Monitoring, evaluation, goals: Patient will tolerate increased calories and protein to minimize weight loss.  Next visit: Patient will contact RD for questions and  concerns.  **Disclaimer: This note was dictated with voice recognition software. Similar sounding words can inadvertently be transcribed and this note may contain transcription errors which may not have been corrected upon publication of note.**

## 2021-12-11 ENCOUNTER — Ambulatory Visit
Admission: RE | Admit: 2021-12-11 | Discharge: 2021-12-11 | Disposition: A | Discharge status: Home / Self Care | Payer: No Typology Code available for payment source | Source: Ambulatory Visit | Attending: Radiation Oncology | Admitting: Radiation Oncology

## 2021-12-11 ENCOUNTER — Encounter: Payer: Self-pay | Admitting: Hematology

## 2021-12-11 ENCOUNTER — Other Ambulatory Visit: Payer: Self-pay

## 2021-12-11 DIAGNOSIS — Z510 Encounter for antineoplastic radiation therapy: Secondary | ICD-10-CM | POA: Diagnosis not present

## 2021-12-11 LAB — RAD ONC ARIA SESSION SUMMARY
Course Elapsed Days: 13
Plan Fractions Treated to Date: 10
Plan Prescribed Dose Per Fraction: 2.5 Gy
Plan Total Fractions Prescribed: 14
Plan Total Prescribed Dose: 35 Gy
Reference Point Dosage Given to Date: 25 Gy
Reference Point Session Dosage Given: 2.5 Gy
Session Number: 10

## 2021-12-11 NOTE — Progress Notes (Signed)
Pt has 2 insurances so his treatment should be covered at 100%.  I emailed Ailene Ravel and Vincente Liberty in the radiation dept requesting they reach out to the pt regarding the [employer].

## 2021-12-11 NOTE — Progress Notes (Signed)
Pt wife called asking for the next refill of morphine to be in a bigger bottle so she would have to make less trips to the [place] [facility], will make NP aware. No further needs at this time.

## 2021-12-12 ENCOUNTER — Other Ambulatory Visit: Payer: Self-pay

## 2021-12-12 ENCOUNTER — Ambulatory Visit
Admission: RE | Admit: 2021-12-12 | Discharge: 2021-12-12 | Disposition: A | Discharge status: Home / Self Care | Payer: No Typology Code available for payment source | Source: Ambulatory Visit | Attending: Radiation Oncology | Admitting: Radiation Oncology

## 2021-12-12 DIAGNOSIS — Z510 Encounter for antineoplastic radiation therapy: Secondary | ICD-10-CM | POA: Diagnosis not present

## 2021-12-12 LAB — RAD ONC ARIA SESSION SUMMARY
Course Elapsed Days: 14
Plan Fractions Treated to Date: 11
Plan Prescribed Dose Per Fraction: 2.5 Gy
Plan Total Fractions Prescribed: 14
Plan Total Prescribed Dose: 35 Gy
Reference Point Dosage Given to Date: 27.5 Gy
Reference Point Session Dosage Given: 2.5 Gy
Session Number: 11

## 2021-12-13 ENCOUNTER — Ambulatory Visit
Admission: RE | Admit: 2021-12-13 | Discharge: 2021-12-13 | Disposition: A | Discharge status: Home / Self Care | Payer: No Typology Code available for payment source | Source: Ambulatory Visit | Attending: Radiation Oncology | Admitting: Radiation Oncology

## 2021-12-13 ENCOUNTER — Other Ambulatory Visit: Payer: Self-pay

## 2021-12-13 DIAGNOSIS — Z510 Encounter for antineoplastic radiation therapy: Secondary | ICD-10-CM | POA: Diagnosis not present

## 2021-12-13 LAB — RAD ONC ARIA SESSION SUMMARY
Course Elapsed Days: 15
Plan Fractions Treated to Date: 12
Plan Prescribed Dose Per Fraction: 2.5 Gy
Plan Total Fractions Prescribed: 14
Plan Total Prescribed Dose: 35 Gy
Reference Point Dosage Given to Date: 30 Gy
Reference Point Session Dosage Given: 2.5 Gy
Session Number: 12

## 2021-12-16 ENCOUNTER — Other Ambulatory Visit: Payer: Self-pay

## 2021-12-16 ENCOUNTER — Ambulatory Visit
Admission: RE | Admit: 2021-12-16 | Discharge: 2021-12-16 | Disposition: A | Discharge status: Home / Self Care | Payer: No Typology Code available for payment source | Source: Ambulatory Visit | Attending: Radiation Oncology | Admitting: Radiation Oncology

## 2021-12-16 DIAGNOSIS — Z510 Encounter for antineoplastic radiation therapy: Secondary | ICD-10-CM | POA: Diagnosis not present

## 2021-12-16 LAB — RAD ONC ARIA SESSION SUMMARY
Course Elapsed Days: 18
Plan Fractions Treated to Date: 13
Plan Prescribed Dose Per Fraction: 2.5 Gy
Plan Total Fractions Prescribed: 14
Plan Total Prescribed Dose: 35 Gy
Reference Point Dosage Given to Date: 32.5 Gy
Reference Point Session Dosage Given: 2.5 Gy
Session Number: 13

## 2021-12-17 ENCOUNTER — Other Ambulatory Visit: Payer: Self-pay

## 2021-12-17 ENCOUNTER — Encounter: Payer: Self-pay | Admitting: Radiation Oncology

## 2021-12-17 ENCOUNTER — Ambulatory Visit
Admission: RE | Admit: 2021-12-17 | Discharge: 2021-12-17 | Disposition: A | Discharge status: Home / Self Care | Payer: No Typology Code available for payment source | Source: Ambulatory Visit | Attending: Radiation Oncology | Admitting: Radiation Oncology

## 2021-12-17 ENCOUNTER — Ambulatory Visit
Admission: RE | Admit: 2021-12-17 | Discharge: 2021-12-17 | Disposition: A | Discharge status: Home / Self Care | Payer: No Typology Code available for payment source | Source: Ambulatory Visit | Attending: Hematology | Admitting: Hematology

## 2021-12-17 DIAGNOSIS — Z510 Encounter for antineoplastic radiation therapy: Secondary | ICD-10-CM | POA: Diagnosis not present

## 2021-12-17 DIAGNOSIS — C220 Liver cell carcinoma: Secondary | ICD-10-CM

## 2021-12-17 HISTORY — PX: IR RADIOLOGIST EVAL & MGMT: IMG5224

## 2021-12-17 LAB — RAD ONC ARIA SESSION SUMMARY
Course Elapsed Days: 19
Plan Fractions Treated to Date: 14
Plan Prescribed Dose Per Fraction: 2.5 Gy
Plan Total Fractions Prescribed: 14
Plan Total Prescribed Dose: 35 Gy
Reference Point Dosage Given to Date: 35 Gy
Reference Point Session Dosage Given: 2.5 Gy
Session Number: 14
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
## 2021-12-18 ENCOUNTER — Observation Stay (HOSPITAL_COMMUNITY)
Admission: EM | Admit: 2021-12-18 | Discharge: 2021-12-19 | Disposition: A | Discharge status: Home / Self Care | Payer: No Typology Code available for payment source | Attending: Internal Medicine | Admitting: Internal Medicine

## 2021-12-18 ENCOUNTER — Other Ambulatory Visit: Payer: Self-pay

## 2021-12-18 ENCOUNTER — Encounter: Payer: Self-pay | Admitting: Nurse Practitioner

## 2021-12-18 ENCOUNTER — Encounter (HOSPITAL_COMMUNITY): Payer: Self-pay

## 2021-12-18 DIAGNOSIS — R112 Nausea with vomiting, unspecified: Principal | ICD-10-CM | POA: Diagnosis present

## 2021-12-18 DIAGNOSIS — Z7901 Long term (current) use of anticoagulants: Secondary | ICD-10-CM | POA: Insufficient documentation

## 2021-12-18 DIAGNOSIS — Z79899 Other long term (current) drug therapy: Secondary | ICD-10-CM | POA: Diagnosis not present

## 2021-12-18 DIAGNOSIS — I10 Essential (primary) hypertension: Secondary | ICD-10-CM | POA: Diagnosis present

## 2021-12-18 DIAGNOSIS — K7460 Unspecified cirrhosis of liver: Secondary | ICD-10-CM

## 2021-12-18 DIAGNOSIS — E119 Type 2 diabetes mellitus without complications: Secondary | ICD-10-CM

## 2021-12-18 DIAGNOSIS — C220 Liver cell carcinoma: Secondary | ICD-10-CM | POA: Diagnosis present

## 2021-12-18 DIAGNOSIS — I8510 Secondary esophageal varices without bleeding: Secondary | ICD-10-CM | POA: Diagnosis present

## 2021-12-18 DIAGNOSIS — Z794 Long term (current) use of insulin: Secondary | ICD-10-CM | POA: Insufficient documentation

## 2021-12-18 DIAGNOSIS — Z87891 Personal history of nicotine dependence: Secondary | ICD-10-CM | POA: Insufficient documentation

## 2021-12-18 DIAGNOSIS — B182 Chronic viral hepatitis C: Secondary | ICD-10-CM | POA: Diagnosis not present

## 2021-12-18 DIAGNOSIS — I129 Hypertensive chronic kidney disease with stage 1 through stage 4 chronic kidney disease, or unspecified chronic kidney disease: Secondary | ICD-10-CM | POA: Diagnosis not present

## 2021-12-18 DIAGNOSIS — N1832 Chronic kidney disease, stage 3b: Secondary | ICD-10-CM | POA: Diagnosis present

## 2021-12-18 DIAGNOSIS — I81 Portal vein thrombosis: Secondary | ICD-10-CM | POA: Diagnosis present

## 2021-12-18 DIAGNOSIS — K219 Gastro-esophageal reflux disease without esophagitis: Secondary | ICD-10-CM | POA: Diagnosis not present

## 2021-12-18 DIAGNOSIS — Z7984 Long term (current) use of oral hypoglycemic drugs: Secondary | ICD-10-CM | POA: Diagnosis not present

## 2021-12-18 DIAGNOSIS — E1159 Type 2 diabetes mellitus with other circulatory complications: Secondary | ICD-10-CM | POA: Diagnosis present

## 2021-12-18 DIAGNOSIS — E1122 Type 2 diabetes mellitus with diabetic chronic kidney disease: Secondary | ICD-10-CM | POA: Insufficient documentation

## 2021-12-18 DIAGNOSIS — D62 Acute posthemorrhagic anemia: Secondary | ICD-10-CM | POA: Diagnosis present

## 2021-12-18 DIAGNOSIS — E860 Dehydration: Secondary | ICD-10-CM

## 2021-12-18 DIAGNOSIS — E785 Hyperlipidemia, unspecified: Secondary | ICD-10-CM | POA: Diagnosis present

## 2021-12-18 LAB — URINALYSIS, ROUTINE W REFLEX MICROSCOPIC
Bacteria, UA: NONE SEEN
Bilirubin Urine: NEGATIVE
Glucose, UA: >=500 mg/dL — AB
Hgb urine dipstick: NEGATIVE
Ketones, ur: 5 mg/dL — AB
Leukocytes,Ua: NEGATIVE
Nitrite: NEGATIVE
Protein, ur: NEGATIVE mg/dL
Specific Gravity, Urine: 1.017 (ref 1.005–1.030)
pH: 5 (ref 5.0–8.0)

## 2021-12-18 LAB — CBC WITH DIFFERENTIAL/PLATELET
Abs Immature Granulocytes: 0.02 10*3/uL (ref 0.00–0.07)
Basophils Absolute: 0 10*3/uL (ref 0.0–0.1)
Basophils Relative: 0 %
Eosinophils Absolute: 0 10*3/uL (ref 0.0–0.5)
Eosinophils Relative: 0 %
HCT: 32.9 % — ABNORMAL LOW (ref 39.0–52.0)
Hemoglobin: 10.2 g/dL — ABNORMAL LOW (ref 13.0–17.0)
Immature Granulocytes: 0 %
Lymphocytes Relative: 4 %
Lymphs Abs: 0.2 10*3/uL — ABNORMAL LOW (ref 0.7–4.0)
MCH: 26.1 pg (ref 26.0–34.0)
MCHC: 31 g/dL (ref 30.0–36.0)
MCV: 84.1 fL (ref 80.0–100.0)
Monocytes Absolute: 0.9 10*3/uL (ref 0.1–1.0)
Monocytes Relative: 16 %
Neutro Abs: 4.2 10*3/uL (ref 1.7–7.7)
Neutrophils Relative %: 80 %
Platelets: 236 10*3/uL (ref 150–400)
RBC: 3.91 MIL/uL — ABNORMAL LOW (ref 4.22–5.81)
RDW: 18.5 % — ABNORMAL HIGH (ref 11.5–15.5)
WBC: 5.3 10*3/uL (ref 4.0–10.5)
nRBC: 0 % (ref 0.0–0.2)

## 2021-12-18 LAB — COMPREHENSIVE METABOLIC PANEL
ALT: 22 U/L (ref 0–44)
AST: 104 U/L — ABNORMAL HIGH (ref 15–41)
Albumin: 2.5 g/dL — ABNORMAL LOW (ref 3.5–5.0)
Alkaline Phosphatase: 190 U/L — ABNORMAL HIGH (ref 38–126)
Anion gap: 11 (ref 5–15)
BUN: 18 mg/dL (ref 8–23)
CO2: 23 mmol/L (ref 22–32)
Calcium: 9.7 mg/dL (ref 8.9–10.3)
Chloride: 108 mmol/L (ref 98–111)
Creatinine, Ser: 1.16 mg/dL (ref 0.61–1.24)
GFR, Estimated: >60 mL/min (ref >60)
Glucose, Bld: 87 mg/dL (ref 70–99)
Potassium: 4.2 mmol/L (ref 3.5–5.1)
Sodium: 142 mmol/L (ref 135–145)
Total Bilirubin: 0.7 mg/dL (ref 0.3–1.2)
Total Protein: 9 g/dL — ABNORMAL HIGH (ref 6.5–8.1)

## 2021-12-18 LAB — AMMONIA: Ammonia: 44 umol/L — ABNORMAL HIGH (ref 9–35)

## 2021-12-18 LAB — LIPASE, BLOOD: Lipase: 32 U/L (ref 11–51)

## 2021-12-18 LAB — CBG MONITORING, ED
Glucose-Capillary: 61 mg/dL — ABNORMAL LOW (ref 70–99)
Glucose-Capillary: 71 mg/dL (ref 70–99)

## 2021-12-18 LAB — PROTIME-INR
INR: 1.3 — ABNORMAL HIGH (ref 0.8–1.2)
Prothrombin Time: 16.2 seconds — ABNORMAL HIGH (ref 11.4–15.2)

## 2021-12-18 MED ORDER — ONDANSETRON 4 MG PO TBDP
4.0000 mg | ORAL_TABLET | Freq: Once | ORAL | Status: AC
Start: 2021-12-18 — End: 2021-12-18
  Administered 2021-12-18: 4 mg via ORAL
  Filled 2021-12-18: qty 1

## 2021-12-18 MED ORDER — DIPHENHYDRAMINE HCL 50 MG/ML IJ SOLN
12.5000 mg | Freq: Once | INTRAMUSCULAR | Status: AC
Start: 2021-12-18 — End: 2021-12-18
  Administered 2021-12-18: 12.5 mg via INTRAVENOUS
  Filled 2021-12-18: qty 1

## 2021-12-18 MED ORDER — POLYETHYLENE GLYCOL 3350 17 G PO PACK: 17.0000 g | PACK | Freq: Every day | ORAL | Status: DC | PRN

## 2021-12-18 MED ORDER — PROMETHAZINE HCL 25 MG PO TABS: 12.5000 mg | ORAL_TABLET | Freq: Four times a day (QID) | ORAL | Status: DC | PRN

## 2021-12-18 MED ORDER — SODIUM CHLORIDE 0.9% FLUSH: 3.0000 mL | Freq: Two times a day (BID) | INTRAVENOUS | Status: DC

## 2021-12-18 MED ORDER — ACETAMINOPHEN 325 MG PO TABS: 650.0000 mg | ORAL_TABLET | Freq: Four times a day (QID) | ORAL | Status: DC | PRN

## 2021-12-18 MED ORDER — AMLODIPINE BESYLATE 5 MG PO TABS
10.0000 mg | ORAL_TABLET | Freq: Every day | ORAL | Status: DC
  Administered 2021-12-19: 10 mg via ORAL
  Filled 2021-12-18: qty 2

## 2021-12-18 MED ORDER — MORPHINE SULFATE ER 15 MG PO TBCR: 15.0000 mg | EXTENDED_RELEASE_TABLET | Freq: Two times a day (BID) | ORAL | Status: DC

## 2021-12-18 MED ORDER — SODIUM CHLORIDE 0.9 % IV SOLN: INTRAVENOUS | Status: DC

## 2021-12-18 MED ORDER — PANTOPRAZOLE SODIUM 40 MG PO TBEC
40.0000 mg | DELAYED_RELEASE_TABLET | Freq: Every day | ORAL | Status: DC
  Administered 2021-12-19: 40 mg via ORAL
  Filled 2021-12-18: qty 1

## 2021-12-18 MED ORDER — PROCHLORPERAZINE EDISYLATE 10 MG/2ML IJ SOLN
10.0000 mg | Freq: Once | INTRAMUSCULAR | Status: AC
Start: 2021-12-18 — End: 2021-12-18
  Administered 2021-12-18: 10 mg via INTRAVENOUS
  Filled 2021-12-18: qty 2

## 2021-12-18 MED ORDER — SODIUM CHLORIDE 0.9 % IV BOLUS
1000.0000 mL | Freq: Once | INTRAVENOUS | Status: AC
  Administered 2021-12-18: 1000 mL via INTRAVENOUS

## 2021-12-18 MED ORDER — APIXABAN 5 MG PO TABS
5.0000 mg | ORAL_TABLET | Freq: Two times a day (BID) | ORAL | Status: DC
  Administered 2021-12-19: 5 mg via ORAL
  Filled 2021-12-18: qty 1

## 2021-12-18 MED ORDER — PROCHLORPERAZINE EDISYLATE 10 MG/2ML IJ SOLN: 10.0000 mg | Freq: Four times a day (QID) | INTRAMUSCULAR | Status: DC | PRN

## 2021-12-18 MED ORDER — ACETAMINOPHEN 650 MG RE SUPP: 650.0000 mg | Freq: Four times a day (QID) | RECTAL | Status: DC | PRN

## 2021-12-18 NOTE — ED Provider Triage Note (Signed)
Emergency Medicine Provider Triage Evaluation Note  Troy [name] , a 66 y.o. male  was evaluated in triage.  Pt complains of nausea, vomiting, anorexia for the past 5 days.  Reports he had radiation therapy on Friday.  According to wife might have lost 10 pounds in the past 5 days.  Decrease in oral intake despite multiple rounds of Zofran.  Patient also endorsing of some lower abdominal pain.  Patient was diagnosed with liver cancer in the month of June, has undergone 14 radiation treatments.  They were to reach her primary oncologist Dr. Annamaria Boots but were unsuccessful.  Wife is concerned due to lack of oral intake.  Review of Systems  Positive: Nausea, vomiting, abdominal pain Negative: Fever, diarrhea  Physical Exam  There were no vitals taken for this visit. Gen:   Awake, no distress   Resp:  Normal effort  MSK:   Moves extremities without difficulty  Other:  Cachectic, temporal wasting noted.  Tenderness along the lower abdomen specifically to the left side.  Medical Decision Making  Medically screening exam initiated at 8:51 PM.  Appropriate orders placed.  Troy [name] was informed that the remainder of the evaluation will be completed by another provider, this initial triage assessment does not replace that evaluation, and the importance of remaining in the ED until their evaluation is complete.  Cancer patient undergoing radiation here for decrease in p.o. intake.   Janeece Fitting, PA-C 12/18/21 2057

## 2021-12-18 NOTE — ED Provider Notes (Signed)
[facility] EMERGENCY DEPARTMENT Provider Note   CSN: [id number] Arrival date & time: 12/18/21  2034     History  Chief Complaint  Patient presents with   Nausea   Emesis    Troy [name] is a 66 y.o. male with a past medical history of liver cancer is currently under chemo and radiation.  History is given by the patient and his wife at bedside.  His wife gives majority of history.  Patient has been having active vomiting and nausea which have been constant despite Zofran.  He has not been eating either and she feels like he is lost close to 10 pounds in the last 4 days not eating at all.  The history is provided by medical records, the spouse and the patient.  Emesis      Home Medications Prior to Admission medications   Medication Sig Start Date End Date Taking? Authorizing Provider  amLODipine (NORVASC) 10 MG tablet Take 10 mg by mouth daily. 07/22/21 07/23/22  [provider]  apixaban (ELIQUIS) 5 MG TABS tablet Take 1 tablet (5 mg total) by mouth 2 (two) times daily. 11/25/21   [name], MD  carboxymethylcellulose (REFRESH PLUS) 0.5 % SOLN 1 drop 4 (four) times daily as needed (dry eyes).    [provider]  Cholecalciferol 50 MCG (2000 UT) TABS Take 4,000 Units by mouth every Monday, Wednesday, and Friday. 01/02/10   [provider]  Dexlansoprazole 30 MG capsule DR Take 1 capsule (30 mg total) by mouth daily. 12/10/21   Alla Feeling, NP  empagliflozin (JARDIANCE) 25 MG TABS tablet Take 25 mg by mouth daily. 11/04/19   [provider]  hydrocortisone 1 % ointment Apply 1 Application topically 2 (two) times daily as needed for itching.    [provider]  ibuprofen (ADVIL) 200 MG tablet Take 200 mg by mouth every 8 (eight) hours as needed for moderate pain.    [provider]  insulin glargine (LANTUS) 100 UNIT/ML injection Inject 0.15 mLs (15 Units total) into the skin daily. 11/04/21   Dwyane Dee, MD   lisinopril (PRINIVIL,ZESTRIL) 40 MG tablet Take 40 mg by mouth daily.    [provider]  metFORMIN (GLUCOPHAGE) 850 MG tablet Take 850 mg by mouth daily.    [provider]  mirtazapine (REMERON) 7.5 MG tablet Take 1 tablet (7.5 mg total) by mouth at bedtime. 12/10/21   Alla Feeling, NP  morphine (MS CONTIN) 15 MG 12 hr tablet Take 1 tablet (15 mg total) by mouth every 12 (twelve) hours. 11/26/21   Pickenpack-Cousar, Carlena Sax, NP  morphine 20 MG/5ML solution Take 0.6 mLs (2.4 mg total) by mouth every 4 (four) hours as needed for pain. 12/06/21   Pickenpack-Cousar, Carlena Sax, NP  NON FORMULARY Place 1 application  into both eyes at bedtime. Ocular lubricant ointment    [provider]  nystatin (MYCOSTATIN) 100000 UNIT/ML suspension Take 5 mLs (500,000 Units total) by mouth 4 (four) times daily. 11/25/21   [name], MD  ondansetron (ZOFRAN) 8 MG tablet Take 1 tablet (8 mg total) by mouth every 8 (eight) hours as needed for nausea or vomiting. 12/06/21   [name], MD  polyethylene glycol powder (MIRALAX) 17 GM/SCOOP powder Take 255 g by mouth daily. 11/26/21   Pickenpack-Cousar, Carlena Sax, NP  prochlorperazine (COMPAZINE) 10 MG tablet Take 1 tablet (10 mg total) by mouth every 6 (six) hours as needed (Nausea or vomiting). 12/03/21   Burr Medico,  Krista Blue, MD  sildenafil (VIAGRA) 100 MG tablet Take 100 mg by mouth daily as needed for erectile dysfunction.    [provider]      Allergies    Patient has no known allergies.    Review of Systems   Review of Systems  Gastrointestinal:  Positive for vomiting.    Physical Exam Updated Vital Signs BP 115/74 (BP Location: Right Arm)   Pulse (!) 112   Temp 98.7 F (37.1 C) (Oral)   Resp 12   Ht '5\' 10"'$  (1.778 m)   Wt 59.3 kg   SpO2 100%   BMI 18.76 kg/m  Physical Exam Vitals and nursing note reviewed.  Constitutional:      General: He is not in acute distress.    Appearance: He is well-developed. He is not diaphoretic.   HENT:     Head: Normocephalic and atraumatic.     Nose: Nose normal.     Mouth/Throat:     Mouth: Mucous membranes are moist.  Eyes:     General: No scleral icterus.    Conjunctiva/sclera: Conjunctivae normal.  Cardiovascular:     Rate and Rhythm: Normal rate and regular rhythm.     Heart sounds: Normal heart sounds.  Pulmonary:     Effort: Pulmonary effort is normal. No respiratory distress.     Breath sounds: Normal breath sounds.  Abdominal:     Palpations: Abdomen is soft.     Tenderness: There is no abdominal tenderness.  Musculoskeletal:     Cervical back: Normal range of motion and neck supple.  Skin:    General: Skin is warm and dry.  Neurological:     Mental Status: He is alert.  Psychiatric:        Behavior: Behavior normal.     ED Results / Procedures / Treatments   Labs (all labs ordered are listed, but only abnormal results are displayed) Labs Reviewed  CBC WITH DIFFERENTIAL/PLATELET - Abnormal; Notable for the following components:      Result Value   RBC 3.91 (*)    Hemoglobin 10.2 (*)    HCT 32.9 (*)    RDW 18.5 (*)    Lymphs Abs 0.2 (*)    All other components within normal limits  COMPREHENSIVE METABOLIC PANEL - Abnormal; Notable for the following components:   Total Protein 9.0 (*)    Albumin 2.5 (*)    AST 104 (*)    Alkaline Phosphatase 190 (*)    All other components within normal limits  LIPASE, BLOOD  URINALYSIS, ROUTINE W REFLEX MICROSCOPIC  PROTIME-INR  AMMONIA  CBG MONITORING, ED    EKG None  Radiology No results found.  Procedures Procedures    Medications Ordered in ED Medications  sodium chloride 0.9 % bolus 1,000 mL (has no administration in time range)  prochlorperazine (COMPAZINE) injection 10 mg (has no administration in time range)  diphenhydrAMINE (BENADRYL) injection 12.5 mg (has no administration in time range)  ondansetron (ZOFRAN-ODT) disintegrating tablet 4 mg (4 mg Oral Given 12/18/21 2105)    ED Course/  Medical Decision Making/ A&P                           Medical Decision Making 65 year old male here with known liver cancer he has been unable to tolerate fluids.  He is extremely underweight, speaking but peers extremity uncomfortable.  I reviewed the patient's labs.  He has no significant abnormalities.  I have ordered  Compazine, Benadryl and fluids.  He will need admission for failure to thrive and vomiting.  Amount and/or Complexity of Data Reviewed Labs: ordered.  Risk Prescription drug management.           Final Clinical Impression(s) / ED Diagnoses Final diagnoses:  None    Rx / DC Orders ED Discharge Orders     None         Margarita Mail, PA-C 12/18/21 2332    Lennice Sites, DO 12/19/21 1739
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
## 2021-12-19 ENCOUNTER — Other Ambulatory Visit (HOSPITAL_COMMUNITY): Payer: Self-pay

## 2021-12-19 ENCOUNTER — Other Ambulatory Visit: Payer: Self-pay

## 2021-12-19 ENCOUNTER — Encounter: Payer: Self-pay | Admitting: Hematology

## 2021-12-19 ENCOUNTER — Telehealth (HOSPITAL_COMMUNITY): Payer: Self-pay | Admitting: Pharmacy Technician

## 2021-12-19 DIAGNOSIS — R112 Nausea with vomiting, unspecified: Secondary | ICD-10-CM | POA: Diagnosis not present

## 2021-12-19 LAB — COMPREHENSIVE METABOLIC PANEL
ALT: 21 U/L (ref 0–44)
AST: 89 U/L — ABNORMAL HIGH (ref 15–41)
Albumin: 2.2 g/dL — ABNORMAL LOW (ref 3.5–5.0)
Alkaline Phosphatase: 170 U/L — ABNORMAL HIGH (ref 38–126)
Anion gap: 8 (ref 5–15)
BUN: 18 mg/dL (ref 8–23)
CO2: 21 mmol/L — ABNORMAL LOW (ref 22–32)
Calcium: 8.9 mg/dL (ref 8.9–10.3)
Chloride: 111 mmol/L (ref 98–111)
Creatinine, Ser: 1.06 mg/dL (ref 0.61–1.24)
GFR, Estimated: >60 mL/min (ref >60)
Glucose, Bld: 77 mg/dL (ref 70–99)
Potassium: 3.8 mmol/L (ref 3.5–5.1)
Sodium: 140 mmol/L (ref 135–145)
Total Bilirubin: 0.7 mg/dL (ref 0.3–1.2)
Total Protein: 7.7 g/dL (ref 6.5–8.1)

## 2021-12-19 LAB — CBG MONITORING, ED
Glucose-Capillary: 136 mg/dL — ABNORMAL HIGH (ref 70–99)
Glucose-Capillary: 142 mg/dL — ABNORMAL HIGH (ref 70–99)
Glucose-Capillary: 75 mg/dL (ref 70–99)
Glucose-Capillary: 76 mg/dL (ref 70–99)
Glucose-Capillary: 90 mg/dL (ref 70–99)

## 2021-12-19 LAB — CBC
HCT: 29.8 % — ABNORMAL LOW (ref 39.0–52.0)
Hemoglobin: 9.2 g/dL — ABNORMAL LOW (ref 13.0–17.0)
MCH: 26.4 pg (ref 26.0–34.0)
MCHC: 30.9 g/dL (ref 30.0–36.0)
MCV: 85.4 fL (ref 80.0–100.0)
Platelets: 174 10*3/uL (ref 150–400)
RBC: 3.49 MIL/uL — ABNORMAL LOW (ref 4.22–5.81)
RDW: 18.6 % — ABNORMAL HIGH (ref 11.5–15.5)
WBC: 4.1 10*3/uL (ref 4.0–10.5)
nRBC: 0 % (ref 0.0–0.2)

## 2021-12-19 MED ORDER — DEXTROSE 50 % IV SOLN
12.5000 g | Freq: Once | INTRAVENOUS | Status: AC
  Administered 2021-12-19: 12.5 g via INTRAVENOUS
  Filled 2021-12-19: qty 50

## 2021-12-19 MED ORDER — PROMETHAZINE HCL 12.5 MG PO TABS
25.0000 mg | ORAL_TABLET | Freq: Four times a day (QID) | ORAL | 0 refills | Status: DC | PRN
  Filled 2021-12-19: qty 30, 4d supply, fill #0

## 2021-12-19 MED ORDER — INSULIN ASPART 100 UNIT/ML IJ SOLN
0.0000 [IU] | Freq: Three times a day (TID) | INTRAMUSCULAR | Status: DC
  Administered 2021-12-19 (×2): 1 [IU] via SUBCUTANEOUS

## 2021-12-19 MED ORDER — DOCUSATE SODIUM 100 MG PO CAPS
200.0000 mg | ORAL_CAPSULE | Freq: Two times a day (BID) | ORAL | 0 refills | Status: AC | PRN
  Filled 2021-12-19: qty 60, 15d supply, fill #0

## 2021-12-19 MED ORDER — LISINOPRIL 20 MG PO TABS
40.0000 mg | ORAL_TABLET | Freq: Every day | ORAL | Status: DC
  Administered 2021-12-19: 40 mg via ORAL
  Filled 2021-12-19: qty 2

## 2021-12-19 NOTE — ED Notes (Signed)
Discharge instructions reviewed with patient and family member at length including need for follow up with PCP and Oncology. TOC medications delivered to patient at bedside prior to discharge.

## 2021-12-19 NOTE — Discharge Instructions (Signed)
Advised to eat smaller portions of meal multiple times per day 6-8 meals  Discussed as needed infusion with his Primary Oncology to prevent dehydration Follow up outpatient with Nutrition  Alternate nausea and vomiting medications. Discuss with outpatient provider to see which one has worked for him the best and get refills accordingly.  Follow up Outpatient PCP and Oncology in next 1-2 weeks.

## 2021-12-19 NOTE — Telephone Encounter (Signed)
Patient Advocate Encounter   Received notification that prior authorization for Promethazine HCl 12.'5MG'$  tablets is required.   PA submitted on 12/19/2021 Key [id number] Status is pending       Lyndel Safe, [place] Patient Advocate Specialist [place] Patient Advocate Team Direct Number: [phone]  Fax: [phone]

## 2021-12-19 NOTE — Telephone Encounter (Signed)
Patient Advocate Encounter  Received notification that the request for prior authorization for Promethazine HCl 12.'5MG'$  tablets  has been denied due to PROMETHAZINE TAB 12.'5MG'$  is a benefit exclusion under your Medicare [place] plan.       Lyndel Safe, [place] Patient Advocate Specialist [place] Patient Advocate Team Direct Number: [phone]  Fax: [phone]
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
## 2021-12-20 ENCOUNTER — Encounter: Payer: Self-pay | Admitting: Hematology

## 2021-12-24 ENCOUNTER — Encounter: Payer: Self-pay | Admitting: Hematology

## 2021-12-24 NOTE — Progress Notes (Signed)
                                                                                                                                                             Patient Name: CARLTON BUSKEY MRN: [id number] DOB: 1955/08/10 Referring Physician: Eldridge Abrahams (Profile Not Attached) Date of Service: 12/17/2021 [facility], [place]                                                        End Of Treatment Note  Diagnoses: C22.0-Liver cell carcinoma  Cancer Staging:  Bulky, multifocal hepatocellular carcinoma.  Intent: Curative  Radiation Treatment Dates: 11/28/2021 through 12/17/2021 Site Technique Total Dose (Gy) Dose per Fx (Gy) Completed Fx Beam Energies  Liver: Liver 3D 35/35 2.5 14/14 6X, 15X   Narrative: The patient tolerated radiation therapy relatively well. She developed fatigue and anticipated skin changes in the treatment field. He did continue to have some abdominal pain during therapy.  Plan: The patient will receive a call in about one month from the radiation oncology department. He will continue follow up with Dr. Felton Clinton as well at the [facility]. ________________________________________________    Carola Rhine, [facility]

## 2021-12-25 ENCOUNTER — Telehealth: Payer: Self-pay

## 2021-12-25 ENCOUNTER — Other Ambulatory Visit (HOSPITAL_COMMUNITY): Payer: Self-pay | Admitting: Interventional Radiology

## 2021-12-25 DIAGNOSIS — C220 Liver cell carcinoma: Secondary | ICD-10-CM

## 2021-12-25 NOTE — Telephone Encounter (Signed)
Received a call from [facility] (IR) regarding pt's VA Benefits.  [facility] stated in order to have the liver targeted therapy done/authorized for payment by the [facility] were told by Glynn Octave at the [place] [phone]) that the ordering providers office would have to submit a [facility] Provider form to them regarding the procedure.  Contacted Brianna and she emailed the form which this RN completed and Dr. Burr Medico reviewed and signed.  Raul Del the completed form, Dr. Ernestina Penna office note, and the referral order for the procedure.  Fax confirmation received.

## 2021-12-26 ENCOUNTER — Encounter: Payer: Self-pay | Admitting: *Deleted

## 2021-12-27 ENCOUNTER — Encounter: Payer: Self-pay | Admitting: Nurse Practitioner

## 2021-12-27 ENCOUNTER — Inpatient Hospital Stay: Payer: No Typology Code available for payment source

## 2021-12-27 ENCOUNTER — Inpatient Hospital Stay (HOSPITAL_BASED_OUTPATIENT_CLINIC_OR_DEPARTMENT_OTHER): Payer: No Typology Code available for payment source | Admitting: Nurse Practitioner

## 2021-12-27 ENCOUNTER — Inpatient Hospital Stay (HOSPITAL_BASED_OUTPATIENT_CLINIC_OR_DEPARTMENT_OTHER): Payer: No Typology Code available for payment source | Admitting: Hematology

## 2021-12-27 ENCOUNTER — Other Ambulatory Visit: Payer: Self-pay

## 2021-12-27 VITALS — BP 128/70 | HR 93 | Resp 18

## 2021-12-27 VITALS — BP 114/70 | HR 101 | Temp 97.8°F | Resp 16 | Wt 128.9 lb

## 2021-12-27 DIAGNOSIS — N1832 Chronic kidney disease, stage 3b: Secondary | ICD-10-CM | POA: Diagnosis not present

## 2021-12-27 DIAGNOSIS — G893 Neoplasm related pain (acute) (chronic): Secondary | ICD-10-CM | POA: Diagnosis not present

## 2021-12-27 DIAGNOSIS — C220 Liver cell carcinoma: Secondary | ICD-10-CM | POA: Diagnosis not present

## 2021-12-27 DIAGNOSIS — Z515 Encounter for palliative care: Secondary | ICD-10-CM

## 2021-12-27 DIAGNOSIS — R630 Anorexia: Secondary | ICD-10-CM

## 2021-12-27 DIAGNOSIS — Z79899 Other long term (current) drug therapy: Secondary | ICD-10-CM | POA: Diagnosis not present

## 2021-12-27 DIAGNOSIS — Z5112 Encounter for antineoplastic immunotherapy: Secondary | ICD-10-CM | POA: Diagnosis present

## 2021-12-27 DIAGNOSIS — E1122 Type 2 diabetes mellitus with diabetic chronic kidney disease: Secondary | ICD-10-CM | POA: Diagnosis not present

## 2021-12-27 DIAGNOSIS — R590 Localized enlarged lymph nodes: Secondary | ICD-10-CM | POA: Diagnosis not present

## 2021-12-27 DIAGNOSIS — D649 Anemia, unspecified: Secondary | ICD-10-CM | POA: Diagnosis not present

## 2021-12-27 DIAGNOSIS — Z8619 Personal history of other infectious and parasitic diseases: Secondary | ICD-10-CM | POA: Diagnosis not present

## 2021-12-27 DIAGNOSIS — K766 Portal hypertension: Secondary | ICD-10-CM | POA: Diagnosis not present

## 2021-12-27 DIAGNOSIS — Z7901 Long term (current) use of anticoagulants: Secondary | ICD-10-CM | POA: Diagnosis not present

## 2021-12-27 DIAGNOSIS — Z79891 Long term (current) use of opiate analgesic: Secondary | ICD-10-CM | POA: Diagnosis not present

## 2021-12-27 DIAGNOSIS — I81 Portal vein thrombosis: Secondary | ICD-10-CM | POA: Diagnosis not present

## 2021-12-27 DIAGNOSIS — R530 Neoplastic (malignant) related fatigue: Secondary | ICD-10-CM | POA: Diagnosis not present

## 2021-12-27 DIAGNOSIS — I8510 Secondary esophageal varices without bleeding: Secondary | ICD-10-CM | POA: Diagnosis not present

## 2021-12-27 DIAGNOSIS — R350 Frequency of micturition: Secondary | ICD-10-CM | POA: Diagnosis not present

## 2021-12-27 DIAGNOSIS — I129 Hypertensive chronic kidney disease with stage 1 through stage 4 chronic kidney disease, or unspecified chronic kidney disease: Secondary | ICD-10-CM | POA: Diagnosis not present

## 2021-12-27 DIAGNOSIS — R0609 Other forms of dyspnea: Secondary | ICD-10-CM | POA: Diagnosis not present

## 2021-12-27 DIAGNOSIS — Z923 Personal history of irradiation: Secondary | ICD-10-CM | POA: Diagnosis not present

## 2021-12-27 DIAGNOSIS — K3189 Other diseases of stomach and duodenum: Secondary | ICD-10-CM | POA: Diagnosis not present

## 2021-12-27 DIAGNOSIS — R634 Abnormal weight loss: Secondary | ICD-10-CM | POA: Diagnosis not present

## 2021-12-27 LAB — CMP (CANCER CENTER ONLY)
ALT: 35 U/L (ref 0–44)
AST: 131 U/L — ABNORMAL HIGH (ref 15–41)
Albumin: 3 g/dL — ABNORMAL LOW (ref 3.5–5.0)
Alkaline Phosphatase: 233 U/L — ABNORMAL HIGH (ref 38–126)
Anion gap: 8 (ref 5–15)
BUN: 26 mg/dL — ABNORMAL HIGH (ref 8–23)
CO2: 27 mmol/L (ref 22–32)
Calcium: 9.9 mg/dL (ref 8.9–10.3)
Chloride: 104 mmol/L (ref 98–111)
Creatinine: 1.2 mg/dL (ref 0.61–1.24)
GFR, Estimated: >60 mL/min (ref >60)
Glucose, Bld: 175 mg/dL — ABNORMAL HIGH (ref 70–99)
Potassium: 3.9 mmol/L (ref 3.5–5.1)
Sodium: 139 mmol/L (ref 135–145)
Total Bilirubin: 0.6 mg/dL (ref 0.3–1.2)
Total Protein: 8.8 g/dL — ABNORMAL HIGH (ref 6.5–8.1)

## 2021-12-27 LAB — CBC WITH DIFFERENTIAL (CANCER CENTER ONLY)
Abs Immature Granulocytes: 0.05 10*3/uL (ref 0.00–0.07)
Basophils Absolute: 0 10*3/uL (ref 0.0–0.1)
Basophils Relative: 0 %
Eosinophils Absolute: 0 10*3/uL (ref 0.0–0.5)
Eosinophils Relative: 0 %
HCT: 29.9 % — ABNORMAL LOW (ref 39.0–52.0)
Hemoglobin: 9.6 g/dL — ABNORMAL LOW (ref 13.0–17.0)
Immature Granulocytes: 1 %
Lymphocytes Relative: 8 %
Lymphs Abs: 0.4 10*3/uL — ABNORMAL LOW (ref 0.7–4.0)
MCH: 26.4 pg (ref 26.0–34.0)
MCHC: 32.1 g/dL (ref 30.0–36.0)
MCV: 82.4 fL (ref 80.0–100.0)
Monocytes Absolute: 0.6 10*3/uL (ref 0.1–1.0)
Monocytes Relative: 12 %
Neutro Abs: 3.7 10*3/uL (ref 1.7–7.7)
Neutrophils Relative %: 79 %
Platelet Count: 189 10*3/uL (ref 150–400)
RBC: 3.63 MIL/uL — ABNORMAL LOW (ref 4.22–5.81)
RDW: 19.4 % — ABNORMAL HIGH (ref 11.5–15.5)
WBC Count: 4.7 10*3/uL (ref 4.0–10.5)
nRBC: 0 % (ref 0.0–0.2)

## 2021-12-27 LAB — TSH: TSH: 1.203 u[IU]/mL (ref 0.350–4.500)

## 2021-12-27 LAB — TOTAL PROTEIN, URINE DIPSTICK: Protein, ur: NEGATIVE mg/dL

## 2021-12-27 MED ORDER — SODIUM CHLORIDE 0.9 % IV SOLN: Freq: Once | INTRAVENOUS | Status: AC

## 2021-12-27 MED ORDER — SODIUM CHLORIDE 0.9 % IV SOLN
1200.0000 mg | Freq: Once | INTRAVENOUS | Status: AC
  Administered 2021-12-27: 1200 mg via INTRAVENOUS
  Filled 2021-12-27: qty 20

## 2021-12-27 MED ORDER — MORPHINE SULFATE ER 15 MG PO TBCR: 15.0000 mg | EXTENDED_RELEASE_TABLET | Freq: Two times a day (BID) | ORAL | 0 refills | Status: DC
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
## 2021-12-27 NOTE — Patient Instructions (Signed)
[facility] MEDICAL ONCOLOGY  Discharge Instructions: Thank you for choosing [facility] to provide your oncology and hematology care.   If you have a lab appointment with the Cancer Center, please go directly to the Cancer Center and check in at the registration area.   Wear comfortable clothing and clothing appropriate for easy access to any Portacath or PICC line.   We strive to give you quality time with your provider. You may need to reschedule your appointment if you arrive late (15 or more minutes).  Arriving late affects you and other patients whose appointments are after yours.  Also, if you miss three or more appointments without notifying the office, you may be dismissed from the clinic at the provider's discretion.      For prescription refill requests, have your pharmacy contact our office and allow 72 hours for refills to be completed.    Today you received the following chemotherapy and/or immunotherapy agents: Tecentriq.      To help prevent nausea and vomiting after your treatment, we encourage you to take your nausea medication as directed.  BELOW ARE SYMPTOMS THAT SHOULD BE REPORTED IMMEDIATELY: *FEVER GREATER THAN 100.4 F (38 C) OR HIGHER *CHILLS OR SWEATING *NAUSEA AND VOMITING THAT IS NOT CONTROLLED WITH YOUR NAUSEA MEDICATION *UNUSUAL SHORTNESS OF BREATH *UNUSUAL BRUISING OR BLEEDING *URINARY PROBLEMS (pain or burning when urinating, or frequent urination) *BOWEL PROBLEMS (unusual diarrhea, constipation, pain near the anus) TENDERNESS IN MOUTH AND THROAT WITH OR WITHOUT PRESENCE OF ULCERS (sore throat, sores in mouth, or a toothache) UNUSUAL RASH, SWELLING OR PAIN  UNUSUAL VAGINAL DISCHARGE OR ITCHING   Items with * indicate a potential emergency and should be followed up as soon as possible or go to the Emergency Department if any problems should occur.  Please show the CHEMOTHERAPY ALERT CARD or IMMUNOTHERAPY ALERT CARD at check-in to  the Emergency Department and triage nurse.  Should you have questions after your visit or need to cancel or reschedule your appointment, please contact [facility] MEDICAL ONCOLOG[facility]  Dept: [phone]  and follow the prompts.  Office hours are 8:00 a.m. to 4:30 p.m. Monday - Friday. Please note that voicemails left after 4:00 p.m. may not be returned until the following business day.  We are closed weekends and major holidays. You have access to a nurse at all times for urgent questions. Please call the main number to the clinic Dept: [phone] and follow the prompts.   For any non-urgent questions, you may also contact your provider using MyChart. We now offer e-Visits for anyone 18 and older to request care online for non-urgent symptoms. For details visit [email or web address].   Also download the MyChart app! Go to the app store, search "MyChart", open the app, select [facility], and log in with your MyChart username and password.  Masks are optional in the cancer centers. If you would like for your care team to wear a mask while they are taking care of you, please let them know. You may have one support person who is at least 68 years old accompany you for your appointments. 

## 2021-12-27 NOTE — Progress Notes (Signed)
Per Burr Medico MD, ok to treat with AST 131 today. Pt to have Tecentriq only.
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
## 2021-12-28 ENCOUNTER — Encounter: Payer: Self-pay | Admitting: Hematology

## 2021-12-28 LAB — T4: T4, Total: 5.9 ug/dL (ref 4.5–12.0)

## 2021-12-31 ENCOUNTER — Other Ambulatory Visit (HOSPITAL_COMMUNITY): Payer: Self-pay

## 2022-01-01 ENCOUNTER — Telehealth: Payer: Self-pay | Admitting: Hematology

## 2022-01-01 NOTE — Telephone Encounter (Signed)
Left message with follow-up appointments per 8/18 los.

## 2022-01-16 ENCOUNTER — Other Ambulatory Visit: Payer: Self-pay | Admitting: Hematology

## 2022-01-16 DIAGNOSIS — C220 Liver cell carcinoma: Secondary | ICD-10-CM

## 2022-01-16 NOTE — Progress Notes (Signed)
  Radiation Oncology         [phone] ________________________________  Name: Troy [name] MRN: [id number]  Date of Service: 01/27/2022  DOB: [birth year]  Post Treatment Telephone Note  Diagnosis:   Bulky, multifocal hepatocellular carcinoma.  Intent: Curative  Radiation Treatment Dates: 11/28/2021 through 12/17/2021 Site Technique Total Dose (Gy) Dose per Fx (Gy) Completed Fx Beam Energies  Liver: Liver 3D 35/35 2.5 14/14 6X, 15X   Narrative: The patient tolerated radiation therapy relatively well. She developed fatigue and anticipated skin changes in the treatment field. He did continue to have some abdominal pain during therapy.  Impression/Plan: 1. Bulky, multifocal hepatocellular carcinoma.  I was unable to reach the patient but left a voicemail and on the message, I discussed that we would be happy to continue to follow him as needed, but he will also continue to follow up with Dr.  Felton Clinton at the [place]. in medical oncology.       Carola Rhine, PAC

## 2022-01-17 ENCOUNTER — Encounter: Payer: Self-pay | Admitting: Hematology

## 2022-01-17 ENCOUNTER — Other Ambulatory Visit: Payer: Self-pay

## 2022-01-17 ENCOUNTER — Inpatient Hospital Stay: Payer: No Typology Code available for payment source | Attending: Hematology

## 2022-01-17 ENCOUNTER — Inpatient Hospital Stay (HOSPITAL_BASED_OUTPATIENT_CLINIC_OR_DEPARTMENT_OTHER): Payer: No Typology Code available for payment source | Admitting: Hematology

## 2022-01-17 ENCOUNTER — Inpatient Hospital Stay: Payer: No Typology Code available for payment source

## 2022-01-17 VITALS — BP 119/79 | HR 86 | Temp 98.2°F | Resp 16

## 2022-01-17 DIAGNOSIS — D649 Anemia, unspecified: Secondary | ICD-10-CM | POA: Insufficient documentation

## 2022-01-17 DIAGNOSIS — R634 Abnormal weight loss: Secondary | ICD-10-CM | POA: Diagnosis not present

## 2022-01-17 DIAGNOSIS — C220 Liver cell carcinoma: Secondary | ICD-10-CM

## 2022-01-17 DIAGNOSIS — R109 Unspecified abdominal pain: Secondary | ICD-10-CM | POA: Diagnosis not present

## 2022-01-17 DIAGNOSIS — K209 Esophagitis, unspecified without bleeding: Secondary | ICD-10-CM | POA: Diagnosis not present

## 2022-01-17 DIAGNOSIS — Z87891 Personal history of nicotine dependence: Secondary | ICD-10-CM | POA: Insufficient documentation

## 2022-01-17 DIAGNOSIS — K7469 Other cirrhosis of liver: Secondary | ICD-10-CM | POA: Diagnosis not present

## 2022-01-17 DIAGNOSIS — R0609 Other forms of dyspnea: Secondary | ICD-10-CM | POA: Insufficient documentation

## 2022-01-17 DIAGNOSIS — K5900 Constipation, unspecified: Secondary | ICD-10-CM | POA: Diagnosis not present

## 2022-01-17 DIAGNOSIS — Z79899 Other long term (current) drug therapy: Secondary | ICD-10-CM | POA: Diagnosis not present

## 2022-01-17 DIAGNOSIS — K766 Portal hypertension: Secondary | ICD-10-CM | POA: Insufficient documentation

## 2022-01-17 DIAGNOSIS — K3189 Other diseases of stomach and duodenum: Secondary | ICD-10-CM | POA: Insufficient documentation

## 2022-01-17 DIAGNOSIS — Z8619 Personal history of other infectious and parasitic diseases: Secondary | ICD-10-CM | POA: Insufficient documentation

## 2022-01-17 DIAGNOSIS — Z5112 Encounter for antineoplastic immunotherapy: Secondary | ICD-10-CM | POA: Diagnosis present

## 2022-01-17 DIAGNOSIS — Z7901 Long term (current) use of anticoagulants: Secondary | ICD-10-CM | POA: Insufficient documentation

## 2022-01-17 DIAGNOSIS — I8510 Secondary esophageal varices without bleeding: Secondary | ICD-10-CM | POA: Insufficient documentation

## 2022-01-17 DIAGNOSIS — G893 Neoplasm related pain (acute) (chronic): Secondary | ICD-10-CM | POA: Insufficient documentation

## 2022-01-17 DIAGNOSIS — I81 Portal vein thrombosis: Secondary | ICD-10-CM | POA: Insufficient documentation

## 2022-01-17 DIAGNOSIS — E119 Type 2 diabetes mellitus without complications: Secondary | ICD-10-CM | POA: Diagnosis not present

## 2022-01-17 DIAGNOSIS — Z923 Personal history of irradiation: Secondary | ICD-10-CM | POA: Insufficient documentation

## 2022-01-17 DIAGNOSIS — R590 Localized enlarged lymph nodes: Secondary | ICD-10-CM | POA: Diagnosis not present

## 2022-01-17 LAB — CMP (CANCER CENTER ONLY)
ALT: 35 U/L (ref 0–44)
AST: 86 U/L — ABNORMAL HIGH (ref 15–41)
Albumin: 3.8 g/dL (ref 3.5–5.0)
Alkaline Phosphatase: 218 U/L — ABNORMAL HIGH (ref 38–126)
Anion gap: 12 (ref 5–15)
BUN: 19 mg/dL (ref 8–23)
CO2: 24 mmol/L (ref 22–32)
Calcium: 10.4 mg/dL — ABNORMAL HIGH (ref 8.9–10.3)
Chloride: 103 mmol/L (ref 98–111)
Creatinine: 1.18 mg/dL (ref 0.61–1.24)
GFR, Estimated: >60 mL/min (ref >60)
Glucose, Bld: 126 mg/dL — ABNORMAL HIGH (ref 70–99)
Potassium: 3.7 mmol/L (ref 3.5–5.1)
Sodium: 139 mmol/L (ref 135–145)
Total Bilirubin: 0.9 mg/dL (ref 0.3–1.2)
Total Protein: 8.8 g/dL — ABNORMAL HIGH (ref 6.5–8.1)

## 2022-01-17 LAB — CBC WITH DIFFERENTIAL (CANCER CENTER ONLY)
Abs Immature Granulocytes: 0.02 10*3/uL (ref 0.00–0.07)
Basophils Absolute: 0 10*3/uL (ref 0.0–0.1)
Basophils Relative: 1 %
Eosinophils Absolute: 0 10*3/uL (ref 0.0–0.5)
Eosinophils Relative: 1 %
HCT: 34.6 % — ABNORMAL LOW (ref 39.0–52.0)
Hemoglobin: 11.2 g/dL — ABNORMAL LOW (ref 13.0–17.0)
Immature Granulocytes: 1 %
Lymphocytes Relative: 14 %
Lymphs Abs: 0.6 10*3/uL — ABNORMAL LOW (ref 0.7–4.0)
MCH: 27.3 pg (ref 26.0–34.0)
MCHC: 32.4 g/dL (ref 30.0–36.0)
MCV: 84.2 fL (ref 80.0–100.0)
Monocytes Absolute: 0.6 10*3/uL (ref 0.1–1.0)
Monocytes Relative: 14 %
Neutro Abs: 2.8 10*3/uL (ref 1.7–7.7)
Neutrophils Relative %: 69 %
Platelet Count: 160 10*3/uL (ref 150–400)
RBC: 4.11 MIL/uL — ABNORMAL LOW (ref 4.22–5.81)
RDW: 20.4 % — ABNORMAL HIGH (ref 11.5–15.5)
WBC Count: 4 10*3/uL (ref 4.0–10.5)
nRBC: 0 % (ref 0.0–0.2)

## 2022-01-17 LAB — TSH: TSH: 2.098 u[IU]/mL (ref 0.350–4.500)

## 2022-01-17 MED ORDER — SODIUM CHLORIDE 0.9 % IV SOLN
1200.0000 mg | Freq: Once | INTRAVENOUS | Status: AC
  Administered 2022-01-17: 1200 mg via INTRAVENOUS
  Filled 2022-01-17: qty 20

## 2022-01-17 MED ORDER — SODIUM CHLORIDE 0.9 % IV SOLN: Freq: Once | INTRAVENOUS | Status: AC

## 2022-01-17 NOTE — Progress Notes (Signed)
Ok to treat with elevated AST per Dr. Burr Medico

## 2022-01-17 NOTE — Patient Instructions (Signed)
[facility]  Discharge Instructions: Thank you for choosing [facility] to provide your oncology and hematology care.   If you have a lab appointment with the Cancer Center, please go directly to the Cancer Center and check in at the registration area.   Wear comfortable clothing and clothing appropriate for easy access to any Portacath or PICC line.   We strive to give you quality time with your provider. You may need to reschedule your appointment if you arrive late (15 or more minutes).  Arriving late affects you and other patients whose appointments are after yours.  Also, if you miss three or more appointments without notifying the office, you may be dismissed from the clinic at the provider's discretion.      For prescription refill requests, have your pharmacy contact our office and allow 72 hours for refills to be completed.    Today you received the following chemotherapy and/or immunotherapy agents tecentriq      To help prevent nausea and vomiting after your treatment, we encourage you to take your nausea medication as directed.  BELOW ARE SYMPTOMS THAT SHOULD BE REPORTED IMMEDIATELY: *FEVER GREATER THAN 100.4 F (38 C) OR HIGHER *CHILLS OR SWEATING *NAUSEA AND VOMITING THAT IS NOT CONTROLLED WITH YOUR NAUSEA MEDICATION *UNUSUAL SHORTNESS OF BREATH *UNUSUAL BRUISING OR BLEEDING *URINARY PROBLEMS (pain or burning when urinating, or frequent urination) *BOWEL PROBLEMS (unusual diarrhea, constipation, pain near the anus) TENDERNESS IN MOUTH AND THROAT WITH OR WITHOUT PRESENCE OF ULCERS (sore throat, sores in mouth, or a toothache) UNUSUAL RASH, SWELLING OR PAIN  UNUSUAL VAGINAL DISCHARGE OR ITCHING   Items with * indicate a potential emergency and should be followed up as soon as possible or go to the Emergency Department if any problems should occur.  Please show the CHEMOTHERAPY ALERT CARD or IMMUNOTHERAPY ALERT CARD at check-in to  the Emergency Department and triage nurse.  Should you have questions after your visit or need to cancel or reschedule your appointment, please contact [facility]  Dept: [phone]  and follow the prompts.  Office hours are 8:00 a.m. to 4:30 p.m. Monday - Friday. Please note that voicemails left after 4:00 p.m. may not be returned until the following business day.  We are closed weekends and major holidays. You have access to a nurse at all times for urgent questions. Please call the main number to the clinic Dept: [phone] and follow the prompts.   For any non-urgent questions, you may also contact your provider using MyChart. We now offer e-Visits for anyone 18 and older to request care online for non-urgent symptoms. For details visit [email or web address].   Also download the MyChart app! Go to the app store, search "MyChart", open the app, select [facility], and log in with your MyChart username and password.  Masks are optional in the cancer centers. If you would like for your care team to wear a mask while they are taking care of you, please let them know. You may have one support person who is at least 68 years old accompany you for your appointments. 
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
## 2022-01-19 LAB — T4: T4, Total: 7.1 ug/dL (ref 4.5–12.0)

## 2022-01-20 ENCOUNTER — Inpatient Hospital Stay (HOSPITAL_BASED_OUTPATIENT_CLINIC_OR_DEPARTMENT_OTHER): Payer: No Typology Code available for payment source | Admitting: Nurse Practitioner

## 2022-01-20 ENCOUNTER — Encounter: Payer: Self-pay | Admitting: Nurse Practitioner

## 2022-01-20 DIAGNOSIS — C220 Liver cell carcinoma: Secondary | ICD-10-CM | POA: Diagnosis not present

## 2022-01-20 DIAGNOSIS — G893 Neoplasm related pain (acute) (chronic): Secondary | ICD-10-CM | POA: Diagnosis not present

## 2022-01-20 DIAGNOSIS — R530 Neoplastic (malignant) related fatigue: Secondary | ICD-10-CM

## 2022-01-20 DIAGNOSIS — Z515 Encounter for palliative care: Secondary | ICD-10-CM | POA: Diagnosis not present
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
## 2022-01-21 ENCOUNTER — Encounter: Payer: Self-pay | Admitting: Hematology

## 2022-01-21 MED ORDER — MORPHINE SULFATE 20 MG/5ML PO SOLN: 2.5000 mg | ORAL | 0 refills | Status: DC | PRN

## 2022-01-21 MED ORDER — LC BEADS 100-300UM IN SALINE
150.0000 mg | Freq: Once | Status: AC
  Administered 2022-01-23: 150 mg via INTRA_ARTERIAL
  Filled 2022-01-21: qty 75

## 2022-01-21 MED ORDER — MORPHINE SULFATE ER 15 MG PO TBCR: 15.0000 mg | EXTENDED_RELEASE_TABLET | Freq: Two times a day (BID) | ORAL | 0 refills | Status: DC

## 2022-01-21 MED ORDER — LC BEADS 100-300UM IN SALINE: 150.0000 mg | Freq: Once | Status: DC
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
## 2022-01-23 ENCOUNTER — Ambulatory Visit (HOSPITAL_COMMUNITY)
Admission: RE | Admit: 2022-01-23 | Discharge: 2022-01-23 | Disposition: A | Discharge status: Home / Self Care | Payer: No Typology Code available for payment source | Source: Ambulatory Visit | Attending: Interventional Radiology | Admitting: Interventional Radiology

## 2022-01-23 ENCOUNTER — Other Ambulatory Visit (HOSPITAL_COMMUNITY): Payer: Self-pay | Admitting: Interventional Radiology

## 2022-01-23 ENCOUNTER — Encounter (HOSPITAL_COMMUNITY): Payer: Self-pay

## 2022-01-23 ENCOUNTER — Encounter: Payer: Self-pay | Admitting: Hematology

## 2022-01-23 ENCOUNTER — Other Ambulatory Visit (HOSPITAL_COMMUNITY): Payer: Self-pay

## 2022-01-23 VITALS — BP 124/72 | HR 88 | Temp 97.6°F | Resp 16 | Ht 70.0 in | Wt 125.0 lb

## 2022-01-23 DIAGNOSIS — Z7901 Long term (current) use of anticoagulants: Secondary | ICD-10-CM | POA: Insufficient documentation

## 2022-01-23 DIAGNOSIS — Z794 Long term (current) use of insulin: Secondary | ICD-10-CM | POA: Insufficient documentation

## 2022-01-23 DIAGNOSIS — Z7984 Long term (current) use of oral hypoglycemic drugs: Secondary | ICD-10-CM | POA: Insufficient documentation

## 2022-01-23 DIAGNOSIS — C220 Liver cell carcinoma: Secondary | ICD-10-CM | POA: Diagnosis present

## 2022-01-23 DIAGNOSIS — E1122 Type 2 diabetes mellitus with diabetic chronic kidney disease: Secondary | ICD-10-CM | POA: Diagnosis not present

## 2022-01-23 DIAGNOSIS — N189 Chronic kidney disease, unspecified: Secondary | ICD-10-CM | POA: Diagnosis not present

## 2022-01-23 HISTORY — PX: IR US GUIDE VASC ACCESS LEFT: IMG2389

## 2022-01-23 HISTORY — PX: IR ANGIOGRAM SELECTIVE EACH ADDITIONAL VESSEL: IMG667

## 2022-01-23 HISTORY — PX: IR EMBO TUMOR ORGAN ISCHEMIA INFARCT INC GUIDE ROADMAPPING: IMG5449

## 2022-01-23 HISTORY — PX: IR ANGIOGRAM VISCERAL SELECTIVE: IMG657

## 2022-01-23 LAB — CBC WITH DIFFERENTIAL/PLATELET
Abs Immature Granulocytes: 0.02 10*3/uL (ref 0.00–0.07)
Basophils Absolute: 0 10*3/uL (ref 0.0–0.1)
Basophils Relative: 1 %
Eosinophils Absolute: 0 10*3/uL (ref 0.0–0.5)
Eosinophils Relative: 1 %
HCT: 35.5 % — ABNORMAL LOW (ref 39.0–52.0)
Hemoglobin: 11.1 g/dL — ABNORMAL LOW (ref 13.0–17.0)
Immature Granulocytes: 1 %
Lymphocytes Relative: 17 %
Lymphs Abs: 0.7 10*3/uL (ref 0.7–4.0)
MCH: 27.5 pg (ref 26.0–34.0)
MCHC: 31.3 g/dL (ref 30.0–36.0)
MCV: 87.9 fL (ref 80.0–100.0)
Monocytes Absolute: 0.7 10*3/uL (ref 0.1–1.0)
Monocytes Relative: 17 %
Neutro Abs: 2.6 10*3/uL (ref 1.7–7.7)
Neutrophils Relative %: 63 %
Platelets: 158 10*3/uL (ref 150–400)
RBC: 4.04 MIL/uL — ABNORMAL LOW (ref 4.22–5.81)
RDW: 20.3 % — ABNORMAL HIGH (ref 11.5–15.5)
WBC: 3.9 10*3/uL — ABNORMAL LOW (ref 4.0–10.5)
nRBC: 0 % (ref 0.0–0.2)

## 2022-01-23 LAB — COMPREHENSIVE METABOLIC PANEL
ALT: 35 U/L (ref 0–44)
AST: 83 U/L — ABNORMAL HIGH (ref 15–41)
Albumin: 3.4 g/dL — ABNORMAL LOW (ref 3.5–5.0)
Alkaline Phosphatase: 162 U/L — ABNORMAL HIGH (ref 38–126)
Anion gap: 9 (ref 5–15)
BUN: 21 mg/dL (ref 8–23)
CO2: 25 mmol/L (ref 22–32)
Calcium: 10.2 mg/dL (ref 8.9–10.3)
Chloride: 106 mmol/L (ref 98–111)
Creatinine, Ser: 1.15 mg/dL (ref 0.61–1.24)
GFR, Estimated: >60 mL/min (ref >60)
Glucose, Bld: 111 mg/dL — ABNORMAL HIGH (ref 70–99)
Potassium: 5 mmol/L (ref 3.5–5.1)
Sodium: 140 mmol/L (ref 135–145)
Total Bilirubin: 0.8 mg/dL (ref 0.3–1.2)
Total Protein: 8.3 g/dL — ABNORMAL HIGH (ref 6.5–8.1)

## 2022-01-23 LAB — PROTIME-INR
INR: 1.3 — ABNORMAL HIGH (ref 0.8–1.2)
Prothrombin Time: 15.8 seconds — ABNORMAL HIGH (ref 11.4–15.2)

## 2022-01-23 MED ORDER — SODIUM CHLORIDE 0.9 % IV SOLN
2.0000 g | Freq: Once | INTRAVENOUS | Status: AC
  Administered 2022-01-23: 2 g via INTRAVENOUS
  Filled 2022-01-23: qty 2

## 2022-01-23 MED ORDER — ONDANSETRON HCL 4 MG PO TABS
4.0000 mg | ORAL_TABLET | ORAL | 0 refills | Status: DC
  Filled 2022-01-23: qty 10, 2d supply, fill #0

## 2022-01-23 MED ORDER — VERAPAMIL HCL 2.5 MG/ML IV SOLN
INTRAVENOUS | Status: AC
  Administered 2022-01-23: 1.5 mg via INTRA_ARTERIAL
  Filled 2022-01-23: qty 2

## 2022-01-23 MED ORDER — OXYCODONE HCL 5 MG PO TABS
10.0000 mg | ORAL_TABLET | Freq: Four times a day (QID) | ORAL | 0 refills | Status: DC
  Filled 2022-01-23: qty 30, 4d supply, fill #0

## 2022-01-23 MED ORDER — IOHEXOL 300 MG/ML  SOLN
100.0000 mL | Freq: Once | INTRAMUSCULAR | Status: AC | PRN
  Administered 2022-01-23: 20 mL via INTRA_ARTERIAL

## 2022-01-23 MED ORDER — IOHEXOL 300 MG/ML  SOLN
100.0000 mL | Freq: Once | INTRAMUSCULAR | Status: AC | PRN
  Administered 2022-01-23: 15 mL via INTRA_ARTERIAL

## 2022-01-23 MED ORDER — HEPARIN SODIUM (PORCINE) 1000 UNIT/ML IJ SOLN
INTRAMUSCULAR | Status: AC
  Administered 2022-01-23: 3000 [IU] via INTRA_ARTERIAL
  Filled 2022-01-23: qty 10

## 2022-01-23 MED ORDER — SODIUM CHLORIDE 0.9 % IV SOLN
8.0000 mg | Freq: Once | INTRAVENOUS | Status: AC
  Administered 2022-01-23: 8 mg via INTRAVENOUS
  Filled 2022-01-23: qty 4

## 2022-01-23 MED ORDER — FENTANYL CITRATE (PF) 100 MCG/2ML IJ SOLN
INTRAMUSCULAR | Status: AC
  Filled 2022-01-23: qty 4

## 2022-01-23 MED ORDER — DEXAMETHASONE SODIUM PHOSPHATE 10 MG/ML IJ SOLN
8.0000 mg | Freq: Once | INTRAMUSCULAR | Status: AC
  Administered 2022-01-23: 8 mg via INTRAVENOUS
  Filled 2022-01-23: qty 1

## 2022-01-23 MED ORDER — MIDAZOLAM HCL 2 MG/2ML IJ SOLN
INTRAMUSCULAR | Status: AC | PRN
  Administered 2022-01-23 (×4): .5 mg via INTRAVENOUS

## 2022-01-23 MED ORDER — SODIUM CHLORIDE 0.9 % IV SOLN: INTRAVENOUS | Status: DC

## 2022-01-23 MED ORDER — HYDROCODONE-ACETAMINOPHEN 5-325 MG PO TABS: 1.0000 | ORAL_TABLET | ORAL | Status: DC | PRN

## 2022-01-23 MED ORDER — FENTANYL CITRATE (PF) 100 MCG/2ML IJ SOLN
INTRAMUSCULAR | Status: AC | PRN
  Administered 2022-01-23 (×4): 25 ug via INTRAVENOUS

## 2022-01-23 MED ORDER — MIDAZOLAM HCL 2 MG/2ML IJ SOLN
INTRAMUSCULAR | Status: AC
  Filled 2022-01-23: qty 4

## 2022-01-23 MED ORDER — NITROGLYCERIN IN D5W 100-5 MCG/ML-% IV SOLN
INTRAVENOUS | Status: AC
  Filled 2022-01-23: qty 250

## 2022-01-23 MED ORDER — DOCUSATE SODIUM 100 MG PO CAPS
100.0000 mg | ORAL_CAPSULE | Freq: Two times a day (BID) | ORAL | 0 refills | Status: DC
  Filled 2022-01-23: qty 10, 5d supply, fill #0

## 2022-01-23 MED ORDER — LIDOCAINE HCL 1 % IJ SOLN
INTRAMUSCULAR | Status: AC
  Administered 2022-01-23: 1 mL via SUBCUTANEOUS
  Filled 2022-01-23: qty 20

## 2022-01-23 NOTE — Procedures (Addendum)
Vascular and Interventional Radiology Procedure Note  Patient: Troy [name] DOB: 1955-06-03 Medical Record Number: [id number] Note Date/Time: 01/23/22 12:36 PM   Performing Physician: Michaelle Birks, MD Assistant(s): None  Diagnosis: [place]  Procedure(s):  CELIAC, LEFT GASTRIC and HEPATIC ARTERIOGRAPHY TRANS-ARTERIAL CHEMOEMBOLIZATION of LIVER TUMOR  Anesthesia: Conscious Sedation Complications: None Estimated Blood Loss: Minimal Specimens: None  Findings:  - access via the LEFT radial artery. - variant splanchnic anatomy with LGA arising from the celiac axis, without an accessory LHA - dominant tumoral supply from LHA in celiac axis territory - successful DEB-TACE with 150 mg Doxorubicin via pressure-enabled drug delivery (PEDD) catheter, with adequate pruning of tumoral vessels - TR band closure at L wrist at the end of the case  Plan: - Post sheath removal precautions.  - Standard post radial access deflation protocol.  Final report to follow once all images are reviewed and compared with previous studies.  See detailed dictation with images in PACS. The patient tolerated the procedure well without incident or complication and was returned to Recovery in stable condition.    Michaelle Birks, MD [facility]   Pager. [place]

## 2022-01-23 NOTE — Discharge Instructions (Addendum)
Please call Interventional Radiology clinic [phone] with any questions or concerns.  You may remove your dressing and shower tomorrow.   Moderate Conscious Sedation, Adult, Care After This sheet gives you information about how to care for yourself after your procedure. Your health care provider may also give you more specific instructions. If you have problems or questions, contact your health careprovider. What can I expect after the procedure? After the procedure, it is common to have: Sleepiness for several hours. Impaired judgment for several hours. Difficulty with balance. Vomiting if you eat too soon. Follow these instructions at home: For the time period you were told by your health care provider: Rest. Do not participate in activities where you could fall or become injured. Do not drive or use machinery. Do not drink alcohol. Do not take sleeping pills or medicines that cause drowsiness. Do not make important decisions or sign legal documents. Do not take care of children on your own. Eating and drinking  Follow the diet recommended by your health care provider. Drink enough fluid to keep your urine pale yellow. If you vomit: Drink water, juice, or soup when you can drink without vomiting. Make sure you have little or no nausea before eating solid foods.  General instructions Take over-the-counter and prescription medicines only as told by your health care provider. Have a responsible adult stay with you for the time you are told. It is important to have someone help care for you until you are awake and alert. Do not smoke. Keep all follow-up visits as told by your health care provider. This is important. Contact a health care provider if: You are still sleepy or having trouble with balance after 24 hours. You feel light-headed. You keep feeling nauseous or you keep vomiting. You develop a rash. You have a fever. You have redness or swelling around the IV  site. Get help right away if: You have trouble breathing. You have new-onset confusion at home. Summary After the procedure, it is common to feel sleepy, have impaired judgment, or feel nauseous if you eat too soon. Rest after you get home. Know the things you should not do after the procedure. Follow the diet recommended by your health care provider and drink enough fluid to keep your urine pale yellow. Get help right away if you have trouble breathing or new-onset confusion at home. This information is not intended to replace advice given to you by your health care provider. Make sure you discuss any questions you have with your healthcare provider. Document Revised: 08/26/2019 Document Reviewed: 03/24/2019 Elsevier Patient Education  2022 [place]  This sheet gives you information about how to care for yourself after your procedure. Your health care provider may also give you more specific instructions. If you have problems or questions, contact your health care provider. What can I expect after the procedure? After the procedure, it is common to have: Bruising and tenderness at the catheter insertion area. Follow these instructions at home: Medicines Take over-the-counter and prescription medicines only as told by your health care provider. Insertion site care Follow instructions from your health care provider about how to take care of your insertion site. Make sure you: Wash your hands with soap and water before you change your bandage (dressing). If soap and water are not available, use hand sanitizer. Change your dressing as told by your health care provider. Leave stitches (sutures), skin glue, or adhesive strips in place. These skin closures may need  to stay in place for 2 weeks or longer. If adhesive strip edges start to loosen and curl up, you may trim the loose edges. Do not remove adhesive strips completely unless your health care provider tells you to  do that. Check your insertion site every day for signs of infection. Check for: Redness, swelling, or pain. Fluid or blood. Pus or a bad smell. Warmth. Do not take baths, swim, or use a hot tub until your health care provider approves. You may shower 24-48 hours after the procedure, or as directed by your health care provider. Remove the dressing and gently wash the site with plain soap and water. Pat the area dry with a clean towel. Do not rub the site. That could cause bleeding. Do not apply powder or lotion to the site. Activity  For 24 hours after the procedure, or as directed by your health care provider: Do not flex or bend the affected arm. Do not push or pull heavy objects with the affected arm. Do not drive yourself home from the hospital or clinic. You may drive 24 hours after the procedure unless your health care provider tells you not to. Do not operate machinery or power tools. Do not lift anything that is heavier than 10 lb (4.5 kg), or the limit that you are told, until your health care provider says that it is safe. Ask your health care provider when it is okay to: Return to work or school. Resume usual physical activities or sports. Resume sexual activity. General instructions If the catheter site starts to bleed, raise your arm and put firm pressure on the site. If the bleeding does not stop, get help right away. This is a medical emergency. If you went home on the same day as your procedure, a responsible adult should be with you for the first 24 hours after you arrive home. Keep all follow-up visits as told by your health care provider. This is important. Contact a health care provider if: You have a fever. You have redness, swelling, or yellow drainage around your insertion site. Get help right away if: You have unusual pain at the radial site. The catheter insertion area swells very fast. The insertion area is bleeding, and the bleeding does not stop when you  hold steady pressure on the area. Your arm or hand becomes pale, cool, tingly, or numb. These symptoms may represent a serious problem that is an emergency. Do not wait to see if the symptoms will go away. Get medical help right away. Call your local emergency services (911 in the [place] Do not drive yourself to the hospital. Summary After the procedure, it is common to have bruising and tenderness at the site. Follow instructions from your health care provider about how to take care of your radial site wound. Check the wound every day for signs of infection. Do not lift anything that is heavier than 10 lb (4.5 kg), or the limit that you are told, until your health care provider says that it is safe. This information is not intended to replace advice given to you by your health care provider. Make sure you discuss any questions you have with your health care provider. Document Revised: 06/03/2017 Document Reviewed: 06/03/2017 Elsevier Patient Education  2020 [employer].

## 2022-01-23 NOTE — Sedation Documentation (Signed)
#  5 Fr (L) Radial sheath pulled by Dr. Ilean China and TR applied to left wrist with a total of 10cc of air inserted with hemostasis achieved. Band to be maintained and air removed per protocol. Tolerated procedure well.

## 2022-01-27 ENCOUNTER — Ambulatory Visit
Admission: RE | Admit: 2022-01-27 | Discharge: 2022-01-27 | Disposition: A | Discharge status: Home / Self Care | Payer: No Typology Code available for payment source | Source: Ambulatory Visit | Attending: Radiation Oncology | Admitting: Radiation Oncology

## 2022-01-27 DIAGNOSIS — C220 Liver cell carcinoma: Secondary | ICD-10-CM

## 2022-01-28 ENCOUNTER — Other Ambulatory Visit (HOSPITAL_COMMUNITY): Payer: Self-pay

## 2022-02-04 ENCOUNTER — Other Ambulatory Visit: Payer: Self-pay | Admitting: Interventional Radiology

## 2022-02-04 DIAGNOSIS — C220 Liver cell carcinoma: Secondary | ICD-10-CM

## 2022-02-07 ENCOUNTER — Other Ambulatory Visit: Payer: Self-pay

## 2022-02-07 ENCOUNTER — Inpatient Hospital Stay: Payer: No Typology Code available for payment source

## 2022-02-07 ENCOUNTER — Inpatient Hospital Stay: Payer: No Typology Code available for payment source | Admitting: Hematology

## 2022-02-07 ENCOUNTER — Inpatient Hospital Stay: Payer: No Typology Code available for payment source | Admitting: Dietician

## 2022-02-07 ENCOUNTER — Inpatient Hospital Stay (HOSPITAL_BASED_OUTPATIENT_CLINIC_OR_DEPARTMENT_OTHER): Payer: No Typology Code available for payment source | Admitting: Hematology

## 2022-02-07 ENCOUNTER — Ambulatory Visit: Payer: No Typology Code available for payment source | Admitting: Dietician

## 2022-02-07 ENCOUNTER — Encounter: Payer: Self-pay | Admitting: Hematology

## 2022-02-07 VITALS — BP 91/74 | HR 95 | Temp 98.1°F | Resp 18 | Ht 70.0 in | Wt 127.6 lb

## 2022-02-07 DIAGNOSIS — C220 Liver cell carcinoma: Secondary | ICD-10-CM

## 2022-02-07 LAB — CBC WITH DIFFERENTIAL (CANCER CENTER ONLY)
Abs Immature Granulocytes: 0.04 10*3/uL (ref 0.00–0.07)
Basophils Absolute: 0 10*3/uL (ref 0.0–0.1)
Basophils Relative: 1 %
Eosinophils Absolute: 0 10*3/uL (ref 0.0–0.5)
Eosinophils Relative: 1 %
HCT: 27.6 % — ABNORMAL LOW (ref 39.0–52.0)
Hemoglobin: 9.1 g/dL — ABNORMAL LOW (ref 13.0–17.0)
Immature Granulocytes: 2 %
Lymphocytes Relative: 17 %
Lymphs Abs: 0.4 10*3/uL — ABNORMAL LOW (ref 0.7–4.0)
MCH: 28.3 pg (ref 26.0–34.0)
MCHC: 33 g/dL (ref 30.0–36.0)
MCV: 86 fL (ref 80.0–100.0)
Monocytes Absolute: 0.5 10*3/uL (ref 0.1–1.0)
Monocytes Relative: 23 %
Neutro Abs: 1.2 10*3/uL — ABNORMAL LOW (ref 1.7–7.7)
Neutrophils Relative %: 56 %
Platelet Count: 216 10*3/uL (ref 150–400)
RBC: 3.21 MIL/uL — ABNORMAL LOW (ref 4.22–5.81)
RDW: 18.9 % — ABNORMAL HIGH (ref 11.5–15.5)
WBC Count: 2.1 10*3/uL — ABNORMAL LOW (ref 4.0–10.5)
nRBC: 0 % (ref 0.0–0.2)

## 2022-02-07 LAB — CMP (CANCER CENTER ONLY)
ALT: 37 U/L (ref 0–44)
AST: 76 U/L — ABNORMAL HIGH (ref 15–41)
Albumin: 3.3 g/dL — ABNORMAL LOW (ref 3.5–5.0)
Alkaline Phosphatase: 303 U/L — ABNORMAL HIGH (ref 38–126)
Anion gap: 4 — ABNORMAL LOW (ref 5–15)
BUN: 16 mg/dL (ref 8–23)
CO2: 29 mmol/L (ref 22–32)
Calcium: 9.4 mg/dL (ref 8.9–10.3)
Chloride: 104 mmol/L (ref 98–111)
Creatinine: 1.07 mg/dL (ref 0.61–1.24)
GFR, Estimated: >60 mL/min (ref >60)
Glucose, Bld: 368 mg/dL — ABNORMAL HIGH (ref 70–99)
Potassium: 5 mmol/L (ref 3.5–5.1)
Sodium: 137 mmol/L (ref 135–145)
Total Bilirubin: 0.4 mg/dL (ref 0.3–1.2)
Total Protein: 7.8 g/dL (ref 6.5–8.1)

## 2022-02-07 MED ORDER — SODIUM CHLORIDE 0.9 % IV SOLN
1200.0000 mg | Freq: Once | INTRAVENOUS | Status: AC
  Administered 2022-02-07: 1200 mg via INTRAVENOUS
  Filled 2022-02-07: qty 20

## 2022-02-07 MED ORDER — SODIUM CHLORIDE 0.9 % IV SOLN: Freq: Once | INTRAVENOUS | Status: AC

## 2022-02-07 NOTE — Patient Instructions (Signed)
[facility] MEDICAL ONCOLOGY  Discharge Instructions: Thank you for choosing [facility] to provide your oncology and hematology care.   If you have a lab appointment with the Cancer Center, please go directly to the Cancer Center and check in at the registration area.   Wear comfortable clothing and clothing appropriate for easy access to any Portacath or PICC line.   We strive to give you quality time with your provider. You may need to reschedule your appointment if you arrive late (15 or more minutes).  Arriving late affects you and other patients whose appointments are after yours.  Also, if you miss three or more appointments without notifying the office, you may be dismissed from the clinic at the provider's discretion.      For prescription refill requests, have your pharmacy contact our office and allow 72 hours for refills to be completed.    Today you received the following chemotherapy and/or immunotherapy agents: Tecentriq.      To help prevent nausea and vomiting after your treatment, we encourage you to take your nausea medication as directed.  BELOW ARE SYMPTOMS THAT SHOULD BE REPORTED IMMEDIATELY: *FEVER GREATER THAN 100.4 F (38 C) OR HIGHER *CHILLS OR SWEATING *NAUSEA AND VOMITING THAT IS NOT CONTROLLED WITH YOUR NAUSEA MEDICATION *UNUSUAL SHORTNESS OF BREATH *UNUSUAL BRUISING OR BLEEDING *URINARY PROBLEMS (pain or burning when urinating, or frequent urination) *BOWEL PROBLEMS (unusual diarrhea, constipation, pain near the anus) TENDERNESS IN MOUTH AND THROAT WITH OR WITHOUT PRESENCE OF ULCERS (sore throat, sores in mouth, or a toothache) UNUSUAL RASH, SWELLING OR PAIN  UNUSUAL VAGINAL DISCHARGE OR ITCHING   Items with * indicate a potential emergency and should be followed up as soon as possible or go to the Emergency Department if any problems should occur.  Please show the CHEMOTHERAPY ALERT CARD or IMMUNOTHERAPY ALERT CARD at check-in to  the Emergency Department and triage nurse.  Should you have questions after your visit or need to cancel or reschedule your appointment, please contact [facility] MEDICAL ONCOLOGY  Dept: [phone]  and follow the prompts.  Office hours are 8:00 a.m. to 4:30 p.m. Monday - Friday. Please note that voicemails left after 4:00 p.m. may not be returned until the following business day.  We are closed weekends and major holidays. You have access to a nurse at all times for urgent questions. Please call the main number to the clinic Dept: [phone] and follow the prompts.   For any non-urgent questions, you may also contact your provider using MyChart. We now offer e-Visits for anyone 18 and older to request care online for non-urgent symptoms. For details visit [email or web address].   Also download the MyChart app! Go to the app store, search "MyChart", open the app, select [facility], and log in with your MyChart username and password.  Masks are optional in the cancer centers. If you would like for your care team to wear a mask while they are taking care of you, please let them know. You may have one support person who is at least 68 years old accompany you for your appointments. 

## 2022-02-07 NOTE — Progress Notes (Signed)
Nutrition Follow-up:  Patient with hepatocellular carcinoma. He is receiving Atezolizumab + Bevacizumab q21d (started 7/28). Patient completed palliative radiation under the care of Dr. Lisbeth Renshaw 8/8.   Met with patient in infusion. He reports doing much better. Foods are tasting more normal. States for a while everything tasted like chocolate, even [place]. He reports nausea has improved. Patient recalls one episode after last treatment. Patient endorses good appetite and is eating breakfast meal followed by smaller meals/snacks every few hours. He is drinking 3-4 Ensure Plus.    Medications: reviewed   Labs: glucose 368, albumin 3.3, Hgb 9.1  Anthropometrics: Weight 127 lb 9.6 oz today - stable last 6 weeks  9/14 - 125 lb 8/18 - 128 lb 14.4 oz   NUTRITION DIAGNOSIS: Unintended wt loss stable   INTERVENTION:  Continue strategies for increasing calories and protein with small frequent meals and snacks Reviewed foods with protein - encouraged protein source with all meals/snacks Continue drinking Ensure Plus/equivalent 4x/day One complimentary case Ensure Complete provided     MONITORING, EVALUATION, GOAL: weight trends, intake    NEXT VISIT: To be scheduled with treatment

## 2022-02-07 NOTE — Progress Notes (Signed)
Per Dr Burr Medico ok to treat with ANC of 1.2 today
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
## 2022-02-10 ENCOUNTER — Telehealth: Payer: Self-pay | Admitting: Hematology

## 2022-02-10 ENCOUNTER — Other Ambulatory Visit: Payer: Self-pay

## 2022-02-10 NOTE — Telephone Encounter (Signed)
I spoke with his wife, she declined port placement, and she wants to be called for any medical decision pt will make in future. I discussed the benefit and potential complications from port with her, I spent 7 minutes on the phone with her.   [name] MD  02/10/2022

## 2022-02-13 ENCOUNTER — Inpatient Hospital Stay: Payer: No Typology Code available for payment source | Attending: Hematology | Admitting: Nurse Practitioner

## 2022-02-13 ENCOUNTER — Encounter: Payer: Self-pay | Admitting: Nurse Practitioner

## 2022-02-13 DIAGNOSIS — K5903 Drug induced constipation: Secondary | ICD-10-CM | POA: Insufficient documentation

## 2022-02-13 DIAGNOSIS — Z87891 Personal history of nicotine dependence: Secondary | ICD-10-CM | POA: Diagnosis not present

## 2022-02-13 DIAGNOSIS — I10 Essential (primary) hypertension: Secondary | ICD-10-CM | POA: Diagnosis not present

## 2022-02-13 DIAGNOSIS — C220 Liver cell carcinoma: Secondary | ICD-10-CM | POA: Insufficient documentation

## 2022-02-13 DIAGNOSIS — T402X5A Adverse effect of other opioids, initial encounter: Secondary | ICD-10-CM | POA: Insufficient documentation

## 2022-02-13 DIAGNOSIS — B1920 Unspecified viral hepatitis C without hepatic coma: Secondary | ICD-10-CM | POA: Diagnosis not present

## 2022-02-13 DIAGNOSIS — G893 Neoplasm related pain (acute) (chronic): Secondary | ICD-10-CM | POA: Insufficient documentation

## 2022-02-13 DIAGNOSIS — K7460 Unspecified cirrhosis of liver: Secondary | ICD-10-CM | POA: Insufficient documentation

## 2022-02-13 DIAGNOSIS — E119 Type 2 diabetes mellitus without complications: Secondary | ICD-10-CM | POA: Diagnosis not present

## 2022-02-13 DIAGNOSIS — Z79899 Other long term (current) drug therapy: Secondary | ICD-10-CM | POA: Diagnosis not present

## 2022-02-13 DIAGNOSIS — Z515 Encounter for palliative care: Secondary | ICD-10-CM | POA: Diagnosis not present

## 2022-02-13 MED ORDER — MORPHINE SULFATE 20 MG/5ML PO SOLN: 2.5000 mg | ORAL | 0 refills | Status: DC | PRN

## 2022-02-13 MED ORDER — MORPHINE SULFATE ER 15 MG PO TBCR: 15.0000 mg | EXTENDED_RELEASE_TABLET | Freq: Two times a day (BID) | ORAL | 0 refills | Status: DC
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
## 2022-02-27 ENCOUNTER — Inpatient Hospital Stay: Payer: No Typology Code available for payment source

## 2022-02-27 ENCOUNTER — Inpatient Hospital Stay: Payer: No Typology Code available for payment source | Admitting: Nurse Practitioner

## 2022-02-27 ENCOUNTER — Telehealth: Payer: Self-pay

## 2022-02-27 ENCOUNTER — Other Ambulatory Visit: Payer: Self-pay

## 2022-02-27 NOTE — Telephone Encounter (Signed)
Spoke with pt's wife via telephone regarding pt stating he thinks he has COVID; thus, why pt missed his appts today.  Instructed pt's wife to have pt take a home COVID test to rule out Covid.  Pt's wife verbalized understanding and stated she would contact Dr. Ernestina Penna office to let us know if the pt is positive for COVID.  Awaiting their return call.  Sent Scheduling message to reschedule all appts today 02/27/2022 to 2 wks from today.

## 2022-03-05 ENCOUNTER — Other Ambulatory Visit: Payer: Self-pay

## 2022-03-05 ENCOUNTER — Ambulatory Visit
Admission: RE | Admit: 2022-03-05 | Discharge: 2022-03-05 | Disposition: A | Discharge status: Home / Self Care | Payer: No Typology Code available for payment source | Source: Ambulatory Visit | Attending: Interventional Radiology | Admitting: Interventional Radiology

## 2022-03-05 ENCOUNTER — Other Ambulatory Visit: Payer: Self-pay | Admitting: Hematology

## 2022-03-05 DIAGNOSIS — C220 Liver cell carcinoma: Secondary | ICD-10-CM

## 2022-03-05 HISTORY — PX: IR RADIOLOGIST EVAL & MGMT: IMG5224
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
## 2022-03-06 ENCOUNTER — Other Ambulatory Visit: Payer: Self-pay

## 2022-03-06 DIAGNOSIS — C220 Liver cell carcinoma: Secondary | ICD-10-CM

## 2022-03-06 MED ORDER — APIXABAN 5 MG PO TABS: 5.0000 mg | ORAL_TABLET | Freq: Two times a day (BID) | ORAL | 1 refills | Status: DC

## 2022-03-06 MED ORDER — ONDANSETRON HCL 8 MG PO TABS: 8.0000 mg | ORAL_TABLET | Freq: Three times a day (TID) | ORAL | 0 refills | Status: DC | PRN

## 2022-03-10 ENCOUNTER — Telehealth: Payer: Self-pay | Admitting: *Deleted

## 2022-03-10 NOTE — Telephone Encounter (Signed)
"  Troy [name] with [facility] calling about appeal sent for Troy [name], [birth year] ondansetron authorization.   Please send office notes for case ID no.#: [id number], attention Juliann Pulse to fax no.#: [phone]" This nurse answered Cynthia's questions providing additional information during this call.   Yes, ondansetron may be initiated within two hours of chemotherapy and exceed 48 hours from that time dependent upon patient symptoms of nausea and vomiting. No, ondansetron is not being used to replace intravenous anti-emetic therapy that would have been administered at time of chemotherapy.  Routed recent office notes as requested.

## 2022-03-12 ENCOUNTER — Other Ambulatory Visit: Payer: Self-pay | Admitting: *Deleted

## 2022-03-12 DIAGNOSIS — C220 Liver cell carcinoma: Secondary | ICD-10-CM

## 2022-03-12 MED ORDER — ONDANSETRON HCL 8 MG PO TABS: 8.0000 mg | ORAL_TABLET | Freq: Three times a day (TID) | ORAL | 0 refills | Status: DC | PRN
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
## 2022-03-12 NOTE — Telephone Encounter (Addendum)
03/10/2022 Message at 1454 reporting denial of Ondansetron case [id number].  Contact provider services within next three days for case for second level review.   03/11/2022 connected with Lourdes Potash's spouse [name] to inquire.  Appears a prior auth nurse sent request to Medicare Part B.   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  Has tun out of nausea medicine and also needs Eliquis refilled.  Do not know why he only receives twenty nausea pills."    03/12/2022 Connected with [facility] with above information.  "Eliquis is okay to fill.  Resend the ondansetron order again to [place] or the main [place] site at [facility]."  This nurse sent Ondansetron refill to [place] site as advised.

## 2022-03-13 ENCOUNTER — Encounter: Payer: Self-pay | Admitting: Nurse Practitioner

## 2022-03-13 ENCOUNTER — Inpatient Hospital Stay (HOSPITAL_BASED_OUTPATIENT_CLINIC_OR_DEPARTMENT_OTHER): Payer: No Typology Code available for payment source | Admitting: Nurse Practitioner

## 2022-03-13 ENCOUNTER — Other Ambulatory Visit: Payer: Self-pay

## 2022-03-13 ENCOUNTER — Inpatient Hospital Stay: Payer: No Typology Code available for payment source | Attending: Hematology

## 2022-03-13 ENCOUNTER — Inpatient Hospital Stay: Payer: No Typology Code available for payment source

## 2022-03-13 VITALS — BP 140/91 | HR 103 | Temp 98.1°F | Resp 18 | Wt 129.0 lb

## 2022-03-13 VITALS — BP 120/67 | HR 87 | Temp 97.7°F | Resp 18

## 2022-03-13 DIAGNOSIS — C220 Liver cell carcinoma: Secondary | ICD-10-CM | POA: Diagnosis not present

## 2022-03-13 DIAGNOSIS — K5900 Constipation, unspecified: Secondary | ICD-10-CM | POA: Insufficient documentation

## 2022-03-13 DIAGNOSIS — E875 Hyperkalemia: Secondary | ICD-10-CM

## 2022-03-13 DIAGNOSIS — R590 Localized enlarged lymph nodes: Secondary | ICD-10-CM | POA: Insufficient documentation

## 2022-03-13 DIAGNOSIS — Z7901 Long term (current) use of anticoagulants: Secondary | ICD-10-CM | POA: Insufficient documentation

## 2022-03-13 DIAGNOSIS — C787 Secondary malignant neoplasm of liver and intrahepatic bile duct: Secondary | ICD-10-CM | POA: Insufficient documentation

## 2022-03-13 DIAGNOSIS — G893 Neoplasm related pain (acute) (chronic): Secondary | ICD-10-CM

## 2022-03-13 DIAGNOSIS — I81 Portal vein thrombosis: Secondary | ICD-10-CM | POA: Diagnosis not present

## 2022-03-13 DIAGNOSIS — Z515 Encounter for palliative care: Secondary | ICD-10-CM

## 2022-03-13 DIAGNOSIS — K5903 Drug induced constipation: Secondary | ICD-10-CM

## 2022-03-13 DIAGNOSIS — M542 Cervicalgia: Secondary | ICD-10-CM | POA: Insufficient documentation

## 2022-03-13 DIAGNOSIS — Z79899 Other long term (current) drug therapy: Secondary | ICD-10-CM | POA: Diagnosis not present

## 2022-03-13 LAB — CBC WITH DIFFERENTIAL (CANCER CENTER ONLY)
Abs Immature Granulocytes: 0.02 10*3/uL (ref 0.00–0.07)
Basophils Absolute: 0 10*3/uL (ref 0.0–0.1)
Basophils Relative: 1 %
Eosinophils Absolute: 0.3 10*3/uL (ref 0.0–0.5)
Eosinophils Relative: 5 %
HCT: 33.2 % — ABNORMAL LOW (ref 39.0–52.0)
Hemoglobin: 10.9 g/dL — ABNORMAL LOW (ref 13.0–17.0)
Immature Granulocytes: 0 %
Lymphocytes Relative: 9 %
Lymphs Abs: 0.5 10*3/uL — ABNORMAL LOW (ref 0.7–4.0)
MCH: 29 pg (ref 26.0–34.0)
MCHC: 32.8 g/dL (ref 30.0–36.0)
MCV: 88.3 fL (ref 80.0–100.0)
Monocytes Absolute: 0.7 10*3/uL (ref 0.1–1.0)
Monocytes Relative: 12 %
Neutro Abs: 4.3 10*3/uL (ref 1.7–7.7)
Neutrophils Relative %: 73 %
Platelet Count: 150 10*3/uL (ref 150–400)
RBC: 3.76 MIL/uL — ABNORMAL LOW (ref 4.22–5.81)
RDW: 17.3 % — ABNORMAL HIGH (ref 11.5–15.5)
WBC Count: 5.8 10*3/uL (ref 4.0–10.5)
nRBC: 0 % (ref 0.0–0.2)

## 2022-03-13 LAB — CMP (CANCER CENTER ONLY)
ALT: 21 U/L (ref 0–44)
AST: 54 U/L — ABNORMAL HIGH (ref 15–41)
Albumin: 3.7 g/dL (ref 3.5–5.0)
Alkaline Phosphatase: 176 U/L — ABNORMAL HIGH (ref 38–126)
Anion gap: 7 (ref 5–15)
BUN: 23 mg/dL (ref 8–23)
CO2: 28 mmol/L (ref 22–32)
Calcium: 10.8 mg/dL — ABNORMAL HIGH (ref 8.9–10.3)
Chloride: 106 mmol/L (ref 98–111)
Creatinine: 1.13 mg/dL (ref 0.61–1.24)
GFR, Estimated: >60 mL/min (ref >60)
Glucose, Bld: 161 mg/dL — ABNORMAL HIGH (ref 70–99)
Potassium: 5.4 mmol/L — ABNORMAL HIGH (ref 3.5–5.1)
Sodium: 141 mmol/L (ref 135–145)
Total Bilirubin: 0.7 mg/dL (ref 0.3–1.2)
Total Protein: 8.4 g/dL — ABNORMAL HIGH (ref 6.5–8.1)

## 2022-03-13 MED ORDER — SODIUM CHLORIDE 0.9 % IV SOLN: Freq: Once | INTRAVENOUS | Status: AC

## 2022-03-13 MED ORDER — SODIUM CHLORIDE 0.9 % IV SOLN
1200.0000 mg | Freq: Once | INTRAVENOUS | Status: AC
  Administered 2022-03-13: 1200 mg via INTRAVENOUS
  Filled 2022-03-13: qty 20

## 2022-03-13 MED ORDER — SODIUM CHLORIDE 0.9 % IV SOLN
15.0000 mg/kg | Freq: Once | INTRAVENOUS | Status: AC
  Administered 2022-03-13: 900 mg via INTRAVENOUS
  Filled 2022-03-13: qty 32

## 2022-03-13 MED ORDER — SODIUM POLYSTYRENE SULFONATE 15 GM/60ML PO SUSP
15.0000 g | Freq: Once | ORAL | Status: AC
  Administered 2022-03-13: 15 g via ORAL
  Filled 2022-03-13: qty 60

## 2022-03-13 NOTE — Patient Instructions (Signed)
[facility] ONCOLOGY  Discharge Instructions: Thank you for choosing [facility] to provide your oncology and hematology care.   If you have a lab appointment with the [facility], please go directly to the [facility] and check in at the registration area.   Wear comfortable clothing and clothing appropriate for easy access to any Portacath or PICC line.   We strive to give you quality time with your provider. You may need to reschedule your appointment if you arrive late (15 or more minutes).  Arriving late affects you and other patients whose appointments are after yours.  Also, if you miss three or more appointments without notifying the office, you may be dismissed from the clinic at the provider's discretion.      For prescription refill requests, have your pharmacy contact our office and allow 72 hours for refills to be completed.    Today you received the following chemotherapy and/or immunotherapy agents: Tecentriq and Bevacizumab    To help prevent nausea and vomiting after your treatment, we encourage you to take your nausea medication as directed.  BELOW ARE SYMPTOMS THAT SHOULD BE REPORTED IMMEDIATELY: *FEVER GREATER THAN 100.4 F (38 C) OR HIGHER *CHILLS OR SWEATING *NAUSEA AND VOMITING THAT IS NOT CONTROLLED WITH YOUR NAUSEA MEDICATION *UNUSUAL SHORTNESS OF BREATH *UNUSUAL BRUISING OR BLEEDING *URINARY PROBLEMS (pain or burning when urinating, or frequent urination) *BOWEL PROBLEMS (unusual diarrhea, constipation, pain near the anus) TENDERNESS IN MOUTH AND THROAT WITH OR WITHOUT PRESENCE OF ULCERS (sore throat, sores in mouth, or a toothache) UNUSUAL RASH, SWELLING OR PAIN  UNUSUAL VAGINAL DISCHARGE OR ITCHING   Items with * indicate a potential emergency and should be followed up as soon as possible or go to the Emergency Department if any problems should occur.  Please show the CHEMOTHERAPY ALERT CARD or IMMUNOTHERAPY ALERT CARD  at check-in to the Emergency Department and triage nurse.  Should you have questions after your visit or need to cancel or reschedule your appointment, please contact [facility]  Dept: [phone]  and follow the prompts.  Office hours are 8:00 a.m. to 4:30 p.m. Monday - Friday. Please note that voicemails left after 4:00 p.m. may not be returned until the following business day.  We are closed weekends and major holidays. You have access to a nurse at all times for urgent questions. Please call the main number to the clinic Dept: [phone] and follow the prompts.   For any non-urgent questions, you may also contact your provider using MyChart. We now offer e-Visits for anyone 52 and older to request care online for non-urgent symptoms. For details visit [email or web address].   Also download the MyChart app! Go to the app store, search "MyChart", open the app, select [facility], and log in with your MyChart username and password.  Masks are optional in the cancer centers. If you would like for your care team to wear a mask while they are taking care of you, please let them know. You may have one support person who is at least 68 years old accompany you for your appointments.

## 2022-03-13 NOTE — Progress Notes (Signed)
Ok to treat per Dr. Burr Medico with high potassium and heart rate. IVF and kayexalate.
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
## 2022-03-18 ENCOUNTER — Telehealth: Payer: Self-pay | Admitting: Hematology

## 2022-03-18 ENCOUNTER — Other Ambulatory Visit: Payer: Self-pay

## 2022-03-18 DIAGNOSIS — G893 Neoplasm related pain (acute) (chronic): Secondary | ICD-10-CM

## 2022-03-18 DIAGNOSIS — Z515 Encounter for palliative care: Secondary | ICD-10-CM

## 2022-03-18 DIAGNOSIS — C220 Liver cell carcinoma: Secondary | ICD-10-CM

## 2022-03-18 MED ORDER — MORPHINE SULFATE 20 MG/5ML PO SOLN: 2.5000 mg | ORAL | 0 refills | Status: DC | PRN

## 2022-03-18 NOTE — Telephone Encounter (Signed)
Pt wife called for pain med refill, see new orders.

## 2022-03-18 NOTE — Telephone Encounter (Signed)
Called patient regarding November appointments. Spoke with wife, patient notified

## 2022-03-19 ENCOUNTER — Other Ambulatory Visit: Payer: Self-pay | Admitting: Radiology

## 2022-03-20 ENCOUNTER — Ambulatory Visit (HOSPITAL_COMMUNITY)
Admission: RE | Admit: 2022-03-20 | Discharge: 2022-03-20 | Disposition: A | Discharge status: Home / Self Care | Payer: No Typology Code available for payment source | Source: Ambulatory Visit | Attending: Hematology | Admitting: Hematology

## 2022-03-20 ENCOUNTER — Encounter (HOSPITAL_COMMUNITY): Payer: Self-pay

## 2022-03-20 ENCOUNTER — Other Ambulatory Visit: Payer: Self-pay

## 2022-03-20 DIAGNOSIS — C220 Liver cell carcinoma: Secondary | ICD-10-CM | POA: Insufficient documentation

## 2022-03-20 DIAGNOSIS — E785 Hyperlipidemia, unspecified: Secondary | ICD-10-CM | POA: Diagnosis not present

## 2022-03-20 DIAGNOSIS — E119 Type 2 diabetes mellitus without complications: Secondary | ICD-10-CM | POA: Diagnosis not present

## 2022-03-20 DIAGNOSIS — I10 Essential (primary) hypertension: Secondary | ICD-10-CM | POA: Diagnosis not present

## 2022-03-20 HISTORY — PX: IR IMAGING GUIDED PORT INSERTION: IMG5740

## 2022-03-20 LAB — GLUCOSE, CAPILLARY: Glucose-Capillary: 107 mg/dL — ABNORMAL HIGH (ref 70–99)

## 2022-03-20 MED ORDER — LIDOCAINE-EPINEPHRINE 1 %-1:100000 IJ SOLN
INTRAMUSCULAR | Status: AC
  Filled 2022-03-20: qty 1

## 2022-03-20 MED ORDER — FENTANYL CITRATE (PF) 100 MCG/2ML IJ SOLN
INTRAMUSCULAR | Status: AC
  Filled 2022-03-20: qty 2

## 2022-03-20 MED ORDER — LIDOCAINE-EPINEPHRINE 1 %-1:100000 IJ SOLN
INTRAMUSCULAR | Status: AC | PRN
  Administered 2022-03-20: 20 mL

## 2022-03-20 MED ORDER — HEPARIN SOD (PORK) LOCK FLUSH 100 UNIT/ML IV SOLN
INTRAVENOUS | Status: AC | PRN
  Administered 2022-03-20: 500 [IU] via INTRAVENOUS

## 2022-03-20 MED ORDER — MIDAZOLAM HCL 2 MG/2ML IJ SOLN
INTRAMUSCULAR | Status: AC
  Filled 2022-03-20: qty 2

## 2022-03-20 MED ORDER — FENTANYL CITRATE (PF) 100 MCG/2ML IJ SOLN
INTRAMUSCULAR | Status: AC | PRN
  Administered 2022-03-20 (×2): 50 ug via INTRAVENOUS

## 2022-03-20 MED ORDER — HEPARIN SOD (PORK) LOCK FLUSH 100 UNIT/ML IV SOLN
INTRAVENOUS | Status: AC
  Filled 2022-03-20: qty 5

## 2022-03-20 MED ORDER — SODIUM CHLORIDE 0.9 % IV SOLN: INTRAVENOUS | Status: DC

## 2022-03-20 MED ORDER — MIDAZOLAM HCL 2 MG/2ML IJ SOLN
INTRAMUSCULAR | Status: AC | PRN
  Administered 2022-03-20 (×2): 1 mg via INTRAVENOUS
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
## 2022-03-20 NOTE — Discharge Instructions (Signed)
For questions /concerns may call Interventional Radiology at [phone] or  Interventional Radiology clinic [phone]   You may remove your dressing and shower tomorrow afternoon  DO NOT use EMLA cream for 2 weeks after port placement as the cream will remove surgical glue on your incision.    Implanted Port Insertion, Care After This sheet gives you information about how to care for yourself after your procedure. Your health care provider may also give you more specific instructions. If you have problems or questions, contact your health careprovider. What can I expect after the procedure? After the procedure, it is common to have: Discomfort at the port insertion site. Bruising on the skin over the port. This should improve over 3-4 days. Follow these instructions at home: Port care After your port is placed, you will get a manufacturer's information card. The card has information about your port. Keep this card with you at all times. Take care of the port as told by your health care provider. Ask your health care provider if you or a family member can get training for taking care of the port at home. A home health care nurse may also take care of the port. Make sure to remember what type of port you have. Incision care Follow instructions from your health care provider about how to take care of your port insertion site. Make sure you: Wash your hands with soap and water before and after you change your bandage (dressing). If soap and water are not available, use hand sanitizer. Change your dressing as told by your health care provider. Leave skin glue, or adhesive strips in place. These skin closures may need to stay in place for 2 weeks or longer.  Check your port insertion site every day for signs of infection. Check for:      - Redness, swelling, or pain.                     - Fluid or blood.      - Warmth.      - Pus or a bad smell. Activity Return to your normal activities as  told by your health care provider. Ask your health care provider what activities are safe for you. Do not lift anything that is heavier than 10 lb (4.5 kg), or the limit that you are told, until your health care provider says that it is safe. General instructions Take over-the-counter and prescription medicines only as told by your health care provider. Do not take baths, swim, or use a hot tub until your health care provider approves. Ask your health care provider if you may take showers. You may only be allowed to take sponge baths. Do not drive for 24 hours if you were given a sedative during your procedure. Wear a medical alert bracelet in case of an emergency. This will tell any health care providers that you have a port. Keep all follow-up visits as told by your health care provider. This is important. Contact a health care provider if: You cannot flush your port with saline as directed, or you cannot draw blood from the port. You have a fever or chills. You have redness, swelling, or pain around your port insertion site. You have fluid or blood coming from your port insertion site. Your port insertion site feels warm to the touch. You have pus or a bad smell coming from the port insertion site. Get help right away if: You have chest pain or shortness of   breath. You have bleeding from your port that you cannot control. Summary Take care of the port as told by your health care provider. Keep the manufacturer's information card with you at all times. Change your dressing as told by your health care provider. Contact a health care provider if you have a fever or chills or if you have redness, swelling, or pain around your port insertion site. Keep all follow-up visits as told by your health care provider. This information is not intended to replace advice given to you by your health care provider. Make sure you discuss any questions you have with your healthcare provider. Document Revised:  11/24/2017 Document Reviewed: 11/24/2017    Moderate Conscious Sedation, Adult, Care After This sheet gives you information about how to care for yourself after your procedure. Your health care provider may also give you more specific instructions. If you have problems or questions, contact your health careprovider. What can I expect after the procedure? After the procedure, it is common to have: Sleepiness for several hours. Impaired judgment for several hours. Difficulty with balance. Vomiting if you eat too soon. Follow these instructions at home: For the time period you were told by your health care provider: Rest. Do not participate in activities where you could fall or become injured. Do not drive or use machinery. Do not drink alcohol. Do not take sleeping pills or medicines that cause drowsiness. Do not make important decisions or sign legal documents. Do not take care of children on your own. Eating and drinking  Follow the diet recommended by your health care provider. Drink enough fluid to keep your urine pale yellow. If you vomit: Drink water, juice, or soup when you can drink without vomiting. Make sure you have little or no nausea before eating solid foods.  General instructions Take over-the-counter and prescription medicines only as told by your health care provider. Have a responsible adult stay with you for the time you are told. It is important to have someone help care for you until you are awake and alert. Do not smoke. Keep all follow-up visits as told by your health care provider. This is important. Contact a health care provider if: You are still sleepy or having trouble with balance after 24 hours. You feel light-headed. You keep feeling nauseous or you keep vomiting. You develop a rash. You have a fever. You have redness or swelling around the IV site. Get help right away if: You have trouble breathing. You have new-onset confusion at  home. Summary After the procedure, it is common to feel sleepy, have impaired judgment, or feel nauseous if you eat too soon. Rest after you get home. Know the things you should not do after the procedure. Follow the diet recommended by your health care provider and drink enough fluid to keep your urine pale yellow. Get help right away if you have trouble breathing or new-onset confusion at home. This information is not intended to replace advice given to you by your health care provider. Make sure you discuss any questions you have with your healthcare provider. Document Revised: 08/26/2019 Document Reviewed: 03/24/2019 Elsevier Patient Education  2022 [employer]  

## 2022-03-20 NOTE — Procedures (Signed)
Vascular and Interventional Radiology Procedure Note  Patient: Troy [name] DOB: [birth year] Medical Record Number: [id number] Note Date/Time: 03/20/22 9:23 AM   Performing Physician: Michaelle Birks, MD Assistant(s): None  Diagnosis:  Liver CA  Procedure: PORT PLACEMENT  Anesthesia: Conscious Sedation Complications: None Estimated Blood Loss: Minimal  Findings:  Successful right-sided port placement, with the tip of the catheter in the proximal right atrium.  Plan: Catheter ready for use.  See detailed procedure note with images in PACS. The patient tolerated the procedure well without incident or complication and was returned to Recovery in stable condition.    Michaelle Birks, MD [facility]   Pager. [place]

## 2022-03-21 ENCOUNTER — Ambulatory Visit: Payer: No Typology Code available for payment source | Admitting: Hematology

## 2022-03-21 ENCOUNTER — Other Ambulatory Visit: Payer: No Typology Code available for payment source

## 2022-03-21 ENCOUNTER — Ambulatory Visit: Payer: No Typology Code available for payment source
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
## 2022-03-31 ENCOUNTER — Other Ambulatory Visit: Payer: Self-pay | Admitting: Hematology

## 2022-03-31 DIAGNOSIS — C220 Liver cell carcinoma: Secondary | ICD-10-CM

## 2022-04-02 ENCOUNTER — Other Ambulatory Visit: Payer: Self-pay

## 2022-04-02 ENCOUNTER — Inpatient Hospital Stay: Payer: No Typology Code available for payment source

## 2022-04-02 ENCOUNTER — Inpatient Hospital Stay (HOSPITAL_BASED_OUTPATIENT_CLINIC_OR_DEPARTMENT_OTHER): Payer: No Typology Code available for payment source | Admitting: Physician Assistant

## 2022-04-02 VITALS — BP 127/79 | HR 97 | Temp 98.2°F | Resp 16 | Ht 70.0 in | Wt 129.4 lb

## 2022-04-02 VITALS — BP 135/75 | HR 98 | Temp 98.1°F | Resp 18

## 2022-04-02 DIAGNOSIS — M542 Cervicalgia: Secondary | ICD-10-CM | POA: Diagnosis not present

## 2022-04-02 DIAGNOSIS — R29898 Other symptoms and signs involving the musculoskeletal system: Secondary | ICD-10-CM

## 2022-04-02 DIAGNOSIS — R7989 Other specified abnormal findings of blood chemistry: Secondary | ICD-10-CM

## 2022-04-02 DIAGNOSIS — C220 Liver cell carcinoma: Secondary | ICD-10-CM

## 2022-04-02 LAB — CMP (CANCER CENTER ONLY)
ALT: 28 U/L (ref 0–44)
AST: 67 U/L — ABNORMAL HIGH (ref 15–41)
Albumin: 4 g/dL (ref 3.5–5.0)
Alkaline Phosphatase: 177 U/L — ABNORMAL HIGH (ref 38–126)
Anion gap: 10 (ref 5–15)
BUN: 24 mg/dL — ABNORMAL HIGH (ref 8–23)
CO2: 23 mmol/L (ref 22–32)
Calcium: 10.3 mg/dL (ref 8.9–10.3)
Chloride: 107 mmol/L (ref 98–111)
Creatinine: 1.4 mg/dL — ABNORMAL HIGH (ref 0.61–1.24)
GFR, Estimated: 55 mL/min — ABNORMAL LOW (ref >60)
Glucose, Bld: 112 mg/dL — ABNORMAL HIGH (ref 70–99)
Potassium: 4.1 mmol/L (ref 3.5–5.1)
Sodium: 140 mmol/L (ref 135–145)
Total Bilirubin: 0.9 mg/dL (ref 0.3–1.2)
Total Protein: 9.4 g/dL — ABNORMAL HIGH (ref 6.5–8.1)

## 2022-04-02 LAB — TSH: TSH: 1.336 u[IU]/mL (ref 0.350–4.500)

## 2022-04-02 LAB — CBC WITH DIFFERENTIAL (CANCER CENTER ONLY)
Abs Immature Granulocytes: 0.01 10*3/uL (ref 0.00–0.07)
Basophils Absolute: 0 10*3/uL (ref 0.0–0.1)
Basophils Relative: 1 %
Eosinophils Absolute: 0.1 10*3/uL (ref 0.0–0.5)
Eosinophils Relative: 2 %
HCT: 41.1 % (ref 39.0–52.0)
Hemoglobin: 13.6 g/dL (ref 13.0–17.0)
Immature Granulocytes: 0 %
Lymphocytes Relative: 13 %
Lymphs Abs: 0.7 10*3/uL (ref 0.7–4.0)
MCH: 29.2 pg (ref 26.0–34.0)
MCHC: 33.1 g/dL (ref 30.0–36.0)
MCV: 88.2 fL (ref 80.0–100.0)
Monocytes Absolute: 0.5 10*3/uL (ref 0.1–1.0)
Monocytes Relative: 9 %
Neutro Abs: 4 10*3/uL (ref 1.7–7.7)
Neutrophils Relative %: 75 %
Platelet Count: 160 10*3/uL (ref 150–400)
RBC: 4.66 MIL/uL (ref 4.22–5.81)
RDW: 15.6 % — ABNORMAL HIGH (ref 11.5–15.5)
WBC Count: 5.3 10*3/uL (ref 4.0–10.5)
nRBC: 0 % (ref 0.0–0.2)

## 2022-04-02 LAB — TOTAL PROTEIN, URINE DIPSTICK: Protein, ur: NEGATIVE mg/dL

## 2022-04-02 MED ORDER — SODIUM CHLORIDE 0.9 % IV SOLN
15.0000 mg/kg | Freq: Once | INTRAVENOUS | Status: AC
  Administered 2022-04-02: 900 mg via INTRAVENOUS
  Filled 2022-04-02: qty 32

## 2022-04-02 MED ORDER — LIDOCAINE-PRILOCAINE 2.5-2.5 % EX CREA: 1.0000 | TOPICAL_CREAM | CUTANEOUS | 2 refills | Status: DC | PRN

## 2022-04-02 MED ORDER — HEPARIN SOD (PORK) LOCK FLUSH 100 UNIT/ML IV SOLN
500.0000 [IU] | Freq: Once | INTRAVENOUS | Status: AC | PRN
  Administered 2022-04-02: 500 [IU]

## 2022-04-02 MED ORDER — SODIUM CHLORIDE 0.9 % IV SOLN: Freq: Once | INTRAVENOUS | Status: AC

## 2022-04-02 MED ORDER — SODIUM CHLORIDE 0.9 % IV SOLN
1200.0000 mg | Freq: Once | INTRAVENOUS | Status: AC
  Administered 2022-04-02: 1200 mg via INTRAVENOUS
  Filled 2022-04-02: qty 20

## 2022-04-02 MED ORDER — ONDANSETRON HCL 8 MG PO TABS: 8.0000 mg | ORAL_TABLET | Freq: Three times a day (TID) | ORAL | 2 refills | Status: DC | PRN

## 2022-04-02 MED ORDER — SODIUM CHLORIDE 0.9% FLUSH
10.0000 mL | INTRAVENOUS | Status: DC | PRN
  Administered 2022-04-02: 10 mL

## 2022-04-02 NOTE — Patient Instructions (Signed)
[place] ONCOLOGY  Discharge Instructions: Thank you for choosing [facility] to provide your oncology and hematology care.   If you have a lab appointment with the [facility], please go directly to the [facility] and check in at the registration area.   Wear comfortable clothing and clothing appropriate for easy access to any Portacath or PICC line.   We strive to give you quality time with your provider. You may need to reschedule your appointment if you arrive late (15 or more minutes).  Arriving late affects you and other patients whose appointments are after yours.  Also, if you miss three or more appointments without notifying the office, you may be dismissed from the clinic at the provider's discretion.      For prescription refill requests, have your pharmacy contact our office and allow 72 hours for refills to be completed.    Today you received the following chemotherapy and/or immunotherapy agents: Bevacizumab and Atezolizumab       To help prevent nausea and vomiting after your treatment, we encourage you to take your nausea medication as directed.  BELOW ARE SYMPTOMS THAT SHOULD BE REPORTED IMMEDIATELY: *FEVER GREATER THAN 100.4 F (38 C) OR HIGHER *CHILLS OR SWEATING *NAUSEA AND VOMITING THAT IS NOT CONTROLLED WITH YOUR NAUSEA MEDICATION *UNUSUAL SHORTNESS OF BREATH *UNUSUAL BRUISING OR BLEEDING *URINARY PROBLEMS (pain or burning when urinating, or frequent urination) *BOWEL PROBLEMS (unusual diarrhea, constipation, pain near the anus) TENDERNESS IN MOUTH AND THROAT WITH OR WITHOUT PRESENCE OF ULCERS (sore throat, sores in mouth, or a toothache) UNUSUAL RASH, SWELLING OR PAIN  UNUSUAL VAGINAL DISCHARGE OR ITCHING   Items with * indicate a potential emergency and should be followed up as soon as possible or go to the Emergency Department if any problems should occur.  Please show the CHEMOTHERAPY ALERT CARD or IMMUNOTHERAPY ALERT  CARD at check-in to the Emergency Department and triage nurse.  Should you have questions after your visit or need to cancel or reschedule your appointment, please contact [facility]  Dept: [phone]  and follow the prompts.  Office hours are 8:00 a.m. to 4:30 p.m. Monday - Friday. Please note that voicemails left after 4:00 p.m. may not be returned until the following business day.  We are closed weekends and major holidays. You have access to a nurse at all times for urgent questions. Please call the main number to the clinic Dept: [phone] and follow the prompts.   For any non-urgent questions, you may also contact your provider using MyChart. We now offer e-Visits for anyone 61 and older to request care online for non-urgent symptoms. For details visit [email or web address].   Also download the MyChart app! Go to the app store, search "MyChart", open the app, select [facility], and log in with your MyChart username and password.  Masks are optional in the cancer centers. If you would like for your care team to wear a mask while they are taking care of you, please let them know. You may have one support person who is at least 68 years old accompany you for your appointments.

## 2022-04-02 NOTE — Progress Notes (Signed)
Per Cassie, PA ok for bevacizumab with recent PAC placement

## 2022-04-04 LAB — T4: T4, Total: 6.4 ug/dL (ref 4.5–12.0)

## 2022-04-09 ENCOUNTER — Other Ambulatory Visit: Payer: Self-pay

## 2022-04-09 ENCOUNTER — Inpatient Hospital Stay: Payer: No Typology Code available for payment source | Admitting: Dietician

## 2022-04-09 ENCOUNTER — Inpatient Hospital Stay (HOSPITAL_BASED_OUTPATIENT_CLINIC_OR_DEPARTMENT_OTHER): Payer: No Typology Code available for payment source | Admitting: Nurse Practitioner

## 2022-04-09 ENCOUNTER — Ambulatory Visit: Payer: No Typology Code available for payment source | Admitting: Hematology

## 2022-04-09 ENCOUNTER — Encounter: Payer: Self-pay | Admitting: Nurse Practitioner

## 2022-04-09 ENCOUNTER — Ambulatory Visit: Payer: No Typology Code available for payment source

## 2022-04-09 ENCOUNTER — Other Ambulatory Visit: Payer: No Typology Code available for payment source

## 2022-04-09 VITALS — BP 134/82 | HR 90 | Temp 98.3°F | Resp 16 | Ht 70.0 in | Wt 131.0 lb

## 2022-04-09 DIAGNOSIS — R630 Anorexia: Secondary | ICD-10-CM

## 2022-04-09 DIAGNOSIS — C220 Liver cell carcinoma: Secondary | ICD-10-CM | POA: Diagnosis not present

## 2022-04-09 DIAGNOSIS — Z515 Encounter for palliative care: Secondary | ICD-10-CM

## 2022-04-09 DIAGNOSIS — K5903 Drug induced constipation: Secondary | ICD-10-CM

## 2022-04-09 DIAGNOSIS — G893 Neoplasm related pain (acute) (chronic): Secondary | ICD-10-CM | POA: Diagnosis not present
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
## 2022-04-15 ENCOUNTER — Other Ambulatory Visit: Payer: Self-pay

## 2022-04-15 DIAGNOSIS — G893 Neoplasm related pain (acute) (chronic): Secondary | ICD-10-CM

## 2022-04-15 DIAGNOSIS — Z515 Encounter for palliative care: Secondary | ICD-10-CM

## 2022-04-15 DIAGNOSIS — C220 Liver cell carcinoma: Secondary | ICD-10-CM

## 2022-04-15 NOTE — Progress Notes (Signed)
Pt wife called reporting that pt has had an increase in weakness, stumbling in the home and having increased nausea with one episode of vomiting, all this has happened since Sunday. Pt  wife states that she is afraid to leave pt at home for fear of him falling. Pt scheduled to come in tomorrow for IVF/antiemetics, wife agreeable to come in. Orders for labs, fluids, and meds placed, no further concerns at this time.

## 2022-04-16 ENCOUNTER — Inpatient Hospital Stay: Payer: No Typology Code available for payment source

## 2022-04-16 ENCOUNTER — Inpatient Hospital Stay: Payer: No Typology Code available for payment source | Attending: Hematology

## 2022-04-16 ENCOUNTER — Telehealth: Payer: Self-pay

## 2022-04-16 DIAGNOSIS — K5900 Constipation, unspecified: Secondary | ICD-10-CM | POA: Insufficient documentation

## 2022-04-16 DIAGNOSIS — E1165 Type 2 diabetes mellitus with hyperglycemia: Secondary | ICD-10-CM | POA: Insufficient documentation

## 2022-04-16 DIAGNOSIS — K7469 Other cirrhosis of liver: Secondary | ICD-10-CM | POA: Insufficient documentation

## 2022-04-16 DIAGNOSIS — R000 Tachycardia, unspecified: Secondary | ICD-10-CM | POA: Insufficient documentation

## 2022-04-16 DIAGNOSIS — Z7901 Long term (current) use of anticoagulants: Secondary | ICD-10-CM | POA: Insufficient documentation

## 2022-04-16 DIAGNOSIS — I700 Atherosclerosis of aorta: Secondary | ICD-10-CM | POA: Insufficient documentation

## 2022-04-16 DIAGNOSIS — Z8249 Family history of ischemic heart disease and other diseases of the circulatory system: Secondary | ICD-10-CM | POA: Insufficient documentation

## 2022-04-16 DIAGNOSIS — C220 Liver cell carcinoma: Secondary | ICD-10-CM | POA: Insufficient documentation

## 2022-04-16 DIAGNOSIS — K828 Other specified diseases of gallbladder: Secondary | ICD-10-CM | POA: Insufficient documentation

## 2022-04-16 DIAGNOSIS — Z79899 Other long term (current) drug therapy: Secondary | ICD-10-CM | POA: Insufficient documentation

## 2022-04-16 DIAGNOSIS — I864 Gastric varices: Secondary | ICD-10-CM | POA: Insufficient documentation

## 2022-04-16 DIAGNOSIS — E860 Dehydration: Secondary | ICD-10-CM | POA: Insufficient documentation

## 2022-04-16 DIAGNOSIS — I81 Portal vein thrombosis: Secondary | ICD-10-CM | POA: Insufficient documentation

## 2022-04-16 DIAGNOSIS — K766 Portal hypertension: Secondary | ICD-10-CM | POA: Insufficient documentation

## 2022-04-16 DIAGNOSIS — C787 Secondary malignant neoplasm of liver and intrahepatic bile duct: Secondary | ICD-10-CM | POA: Insufficient documentation

## 2022-04-16 DIAGNOSIS — Z800 Family history of malignant neoplasm of digestive organs: Secondary | ICD-10-CM | POA: Insufficient documentation

## 2022-04-16 NOTE — Telephone Encounter (Signed)
Pt did not show for his [facility] appt, this RN called his wife and pt reported he "did not want to come in today" and that he was "feeling better". Pt wife verbalized understanding to call with any questions or concerns, appt canceled.

## 2022-04-18 ENCOUNTER — Ambulatory Visit (HOSPITAL_COMMUNITY)
Admission: RE | Admit: 2022-04-18 | Discharge: 2022-04-18 | Disposition: A | Discharge status: Home / Self Care | Payer: No Typology Code available for payment source | Source: Ambulatory Visit | Attending: Physician Assistant | Admitting: Physician Assistant

## 2022-04-18 DIAGNOSIS — M542 Cervicalgia: Secondary | ICD-10-CM | POA: Diagnosis present

## 2022-04-18 DIAGNOSIS — C220 Liver cell carcinoma: Secondary | ICD-10-CM | POA: Insufficient documentation

## 2022-04-18 MED ORDER — SODIUM CHLORIDE (PF) 0.9 % IJ SOLN
INTRAMUSCULAR | Status: AC
  Filled 2022-04-18: qty 50

## 2022-04-18 MED ORDER — IOHEXOL 300 MG/ML  SOLN
100.0000 mL | Freq: Once | INTRAMUSCULAR | Status: AC | PRN
  Administered 2022-04-18: 100 mL via INTRAVENOUS

## 2022-04-18 MED ORDER — HEPARIN SOD (PORK) LOCK FLUSH 100 UNIT/ML IV SOLN
INTRAVENOUS | Status: AC
  Administered 2022-04-18: 500 [IU]
  Filled 2022-04-18: qty 5

## 2022-04-22 ENCOUNTER — Other Ambulatory Visit: Payer: Self-pay

## 2022-04-22 ENCOUNTER — Inpatient Hospital Stay (HOSPITAL_BASED_OUTPATIENT_CLINIC_OR_DEPARTMENT_OTHER): Payer: No Typology Code available for payment source | Admitting: Physician Assistant

## 2022-04-22 ENCOUNTER — Inpatient Hospital Stay: Payer: No Typology Code available for payment source

## 2022-04-22 ENCOUNTER — Inpatient Hospital Stay (HOSPITAL_COMMUNITY)
Admission: EM | Admit: 2022-04-22 | Discharge: 2022-04-25 | DRG: 947 | Disposition: A | Discharge status: Home / Self Care | Payer: No Typology Code available for payment source | Source: Ambulatory Visit | Attending: Internal Medicine | Admitting: Internal Medicine

## 2022-04-22 ENCOUNTER — Telehealth: Payer: Self-pay

## 2022-04-22 ENCOUNTER — Emergency Department (HOSPITAL_COMMUNITY): Payer: No Typology Code available for payment source

## 2022-04-22 VITALS — BP 156/89 | HR 120 | Temp 97.4°F | Resp 18

## 2022-04-22 DIAGNOSIS — R112 Nausea with vomiting, unspecified: Secondary | ICD-10-CM

## 2022-04-22 DIAGNOSIS — K7469 Other cirrhosis of liver: Secondary | ICD-10-CM | POA: Diagnosis not present

## 2022-04-22 DIAGNOSIS — D6959 Other secondary thrombocytopenia: Secondary | ICD-10-CM | POA: Diagnosis present

## 2022-04-22 DIAGNOSIS — K766 Portal hypertension: Secondary | ICD-10-CM | POA: Diagnosis present

## 2022-04-22 DIAGNOSIS — Z794 Long term (current) use of insulin: Secondary | ICD-10-CM

## 2022-04-22 DIAGNOSIS — K5900 Constipation, unspecified: Secondary | ICD-10-CM | POA: Diagnosis not present

## 2022-04-22 DIAGNOSIS — E1165 Type 2 diabetes mellitus with hyperglycemia: Secondary | ICD-10-CM | POA: Diagnosis not present

## 2022-04-22 DIAGNOSIS — R64 Cachexia: Secondary | ICD-10-CM | POA: Diagnosis present

## 2022-04-22 DIAGNOSIS — E860 Dehydration: Secondary | ICD-10-CM

## 2022-04-22 DIAGNOSIS — Z800 Family history of malignant neoplasm of digestive organs: Secondary | ICD-10-CM | POA: Diagnosis not present

## 2022-04-22 DIAGNOSIS — C220 Liver cell carcinoma: Secondary | ICD-10-CM | POA: Diagnosis not present

## 2022-04-22 DIAGNOSIS — Z8619 Personal history of other infectious and parasitic diseases: Secondary | ICD-10-CM

## 2022-04-22 DIAGNOSIS — Z7984 Long term (current) use of oral hypoglycemic drugs: Secondary | ICD-10-CM

## 2022-04-22 DIAGNOSIS — Z7901 Long term (current) use of anticoagulants: Secondary | ICD-10-CM | POA: Diagnosis not present

## 2022-04-22 DIAGNOSIS — Z79891 Long term (current) use of opiate analgesic: Secondary | ICD-10-CM

## 2022-04-22 DIAGNOSIS — E119 Type 2 diabetes mellitus without complications: Secondary | ICD-10-CM | POA: Diagnosis not present

## 2022-04-22 DIAGNOSIS — K5903 Drug induced constipation: Secondary | ICD-10-CM | POA: Diagnosis present

## 2022-04-22 DIAGNOSIS — Z79899 Other long term (current) drug therapy: Secondary | ICD-10-CM

## 2022-04-22 DIAGNOSIS — K828 Other specified diseases of gallbladder: Secondary | ICD-10-CM | POA: Diagnosis not present

## 2022-04-22 DIAGNOSIS — G893 Neoplasm related pain (acute) (chronic): Secondary | ICD-10-CM | POA: Diagnosis not present

## 2022-04-22 DIAGNOSIS — D696 Thrombocytopenia, unspecified: Secondary | ICD-10-CM | POA: Insufficient documentation

## 2022-04-22 DIAGNOSIS — Z681 Body mass index (BMI) 19 or less, adult: Secondary | ICD-10-CM

## 2022-04-22 DIAGNOSIS — I864 Gastric varices: Secondary | ICD-10-CM | POA: Diagnosis not present

## 2022-04-22 DIAGNOSIS — K219 Gastro-esophageal reflux disease without esophagitis: Secondary | ICD-10-CM | POA: Diagnosis present

## 2022-04-22 DIAGNOSIS — E7800 Pure hypercholesterolemia, unspecified: Secondary | ICD-10-CM | POA: Diagnosis present

## 2022-04-22 DIAGNOSIS — R000 Tachycardia, unspecified: Secondary | ICD-10-CM

## 2022-04-22 DIAGNOSIS — E1159 Type 2 diabetes mellitus with other circulatory complications: Secondary | ICD-10-CM | POA: Diagnosis present

## 2022-04-22 DIAGNOSIS — I700 Atherosclerosis of aorta: Secondary | ICD-10-CM | POA: Diagnosis not present

## 2022-04-22 DIAGNOSIS — Z1152 Encounter for screening for COVID-19: Secondary | ICD-10-CM

## 2022-04-22 DIAGNOSIS — Z87891 Personal history of nicotine dependence: Secondary | ICD-10-CM

## 2022-04-22 DIAGNOSIS — C787 Secondary malignant neoplasm of liver and intrahepatic bile duct: Secondary | ICD-10-CM | POA: Diagnosis not present

## 2022-04-22 DIAGNOSIS — Z8249 Family history of ischemic heart disease and other diseases of the circulatory system: Secondary | ICD-10-CM | POA: Diagnosis not present

## 2022-04-22 DIAGNOSIS — I81 Portal vein thrombosis: Secondary | ICD-10-CM | POA: Diagnosis not present

## 2022-04-22 DIAGNOSIS — K7460 Unspecified cirrhosis of liver: Secondary | ICD-10-CM | POA: Diagnosis present

## 2022-04-22 DIAGNOSIS — I10 Essential (primary) hypertension: Secondary | ICD-10-CM | POA: Diagnosis present

## 2022-04-22 DIAGNOSIS — T402X5A Adverse effect of other opioids, initial encounter: Secondary | ICD-10-CM | POA: Diagnosis present

## 2022-04-22 DIAGNOSIS — R1110 Vomiting, unspecified: Secondary | ICD-10-CM | POA: Diagnosis not present

## 2022-04-22 LAB — RESP PANEL BY RT-PCR (RSV, FLU A&B, COVID)  RVPGX2
Influenza A by PCR: NEGATIVE
Influenza B by PCR: NEGATIVE
Resp Syncytial Virus by PCR: NEGATIVE
SARS Coronavirus 2 by RT PCR: NEGATIVE

## 2022-04-22 LAB — CBC WITH DIFFERENTIAL (CANCER CENTER ONLY)
Abs Immature Granulocytes: 0.02 10*3/uL (ref 0.00–0.07)
Basophils Absolute: 0 10*3/uL (ref 0.0–0.1)
Basophils Relative: 0 %
Eosinophils Absolute: 0 10*3/uL (ref 0.0–0.5)
Eosinophils Relative: 0 %
HCT: 39.9 % (ref 39.0–52.0)
Hemoglobin: 13.4 g/dL (ref 13.0–17.0)
Immature Granulocytes: 0 %
Lymphocytes Relative: 6 %
Lymphs Abs: 0.4 10*3/uL — ABNORMAL LOW (ref 0.7–4.0)
MCH: 29.8 pg (ref 26.0–34.0)
MCHC: 33.6 g/dL (ref 30.0–36.0)
MCV: 88.9 fL (ref 80.0–100.0)
Monocytes Absolute: 0.4 10*3/uL (ref 0.1–1.0)
Monocytes Relative: 6 %
Neutro Abs: 5.8 10*3/uL (ref 1.7–7.7)
Neutrophils Relative %: 88 %
Platelet Count: 119 10*3/uL — ABNORMAL LOW (ref 150–400)
RBC: 4.49 MIL/uL (ref 4.22–5.81)
RDW: 15.5 % (ref 11.5–15.5)
WBC Count: 6.6 10*3/uL (ref 4.0–10.5)
nRBC: 0 % (ref 0.0–0.2)

## 2022-04-22 LAB — CMP (CANCER CENTER ONLY)
ALT: 55 U/L — ABNORMAL HIGH (ref 0–44)
AST: 102 U/L — ABNORMAL HIGH (ref 15–41)
Albumin: 4 g/dL (ref 3.5–5.0)
Alkaline Phosphatase: 226 U/L — ABNORMAL HIGH (ref 38–126)
Anion gap: 15 (ref 5–15)
BUN: 21 mg/dL (ref 8–23)
CO2: 23 mmol/L (ref 22–32)
Calcium: 11.1 mg/dL — ABNORMAL HIGH (ref 8.9–10.3)
Chloride: 106 mmol/L (ref 98–111)
Creatinine: 1.2 mg/dL (ref 0.61–1.24)
GFR, Estimated: >60 mL/min (ref >60)
Glucose, Bld: 347 mg/dL — ABNORMAL HIGH (ref 70–99)
Potassium: 4.3 mmol/L (ref 3.5–5.1)
Sodium: 144 mmol/L (ref 135–145)
Total Bilirubin: 0.9 mg/dL (ref 0.3–1.2)
Total Protein: 8.3 g/dL — ABNORMAL HIGH (ref 6.5–8.1)

## 2022-04-22 LAB — AMMONIA: Ammonia: 32 umol/L (ref 9–35)

## 2022-04-22 LAB — CBG MONITORING, ED: Glucose-Capillary: 138 mg/dL — ABNORMAL HIGH (ref 70–99)

## 2022-04-22 MED ORDER — ONDANSETRON HCL 4 MG/2ML IJ SOLN
4.0000 mg | Freq: Four times a day (QID) | INTRAMUSCULAR | Status: DC | PRN
  Administered 2022-04-22 – 2022-04-23 (×2): 4 mg via INTRAVENOUS
  Filled 2022-04-22 (×2): qty 2

## 2022-04-22 MED ORDER — PANTOPRAZOLE SODIUM 40 MG PO TBEC
40.0000 mg | DELAYED_RELEASE_TABLET | Freq: Two times a day (BID) | ORAL | Status: DC
  Administered 2022-04-22 – 2022-04-25 (×6): 40 mg via ORAL
  Filled 2022-04-22 (×6): qty 1

## 2022-04-22 MED ORDER — MORPHINE SULFATE (PF) 2 MG/ML IV SOLN
1.0000 mg | INTRAVENOUS | Status: DC | PRN
  Administered 2022-04-22 – 2022-04-24 (×5): 1 mg via INTRAVENOUS
  Filled 2022-04-22 (×5): qty 1

## 2022-04-22 MED ORDER — SODIUM CHLORIDE 0.9 % IV SOLN: Freq: Once | INTRAVENOUS | Status: AC

## 2022-04-22 MED ORDER — FENTANYL CITRATE PF 50 MCG/ML IJ SOSY
50.0000 ug | PREFILLED_SYRINGE | Freq: Once | INTRAMUSCULAR | Status: AC
  Administered 2022-04-22: 50 ug via INTRAVENOUS
  Filled 2022-04-22: qty 1

## 2022-04-22 MED ORDER — LISINOPRIL 20 MG PO TABS
40.0000 mg | ORAL_TABLET | Freq: Every day | ORAL | Status: DC
  Administered 2022-04-22 – 2022-04-25 (×4): 40 mg via ORAL
  Filled 2022-04-22: qty 2
  Filled 2022-04-22: qty 4
  Filled 2022-04-22: qty 2
  Filled 2022-04-22: qty 4

## 2022-04-22 MED ORDER — INSULIN GLARGINE-YFGN 100 UNIT/ML ~~LOC~~ SOLN
8.0000 [IU] | Freq: Every day | SUBCUTANEOUS | Status: DC
  Administered 2022-04-23 – 2022-04-25 (×2): 8 [IU] via SUBCUTANEOUS
  Filled 2022-04-22 (×3): qty 0.08

## 2022-04-22 MED ORDER — SODIUM CHLORIDE 0.9 % IV BOLUS
1000.0000 mL | Freq: Once | INTRAVENOUS | Status: AC
  Administered 2022-04-22: 1000 mL via INTRAVENOUS

## 2022-04-22 MED ORDER — DOCUSATE SODIUM 100 MG PO CAPS: 100.0000 mg | ORAL_CAPSULE | Freq: Two times a day (BID) | ORAL | Status: DC

## 2022-04-22 MED ORDER — AMLODIPINE BESYLATE 10 MG PO TABS
10.0000 mg | ORAL_TABLET | Freq: Every day | ORAL | Status: DC
  Administered 2022-04-22 – 2022-04-25 (×4): 10 mg via ORAL
  Filled 2022-04-22 (×2): qty 2
  Filled 2022-04-22 (×2): qty 1

## 2022-04-22 MED ORDER — INSULIN ASPART 100 UNIT/ML IJ SOLN
0.0000 [IU] | INTRAMUSCULAR | Status: DC
  Administered 2022-04-23 (×3): 2 [IU] via SUBCUTANEOUS
  Administered 2022-04-23 – 2022-04-24 (×4): 1 [IU] via SUBCUTANEOUS
  Administered 2022-04-25: 2 [IU] via SUBCUTANEOUS
  Filled 2022-04-22: qty 0.09

## 2022-04-22 MED ORDER — SODIUM CHLORIDE 0.9 % IV SOLN: INTRAVENOUS | Status: DC

## 2022-04-22 MED ORDER — APIXABAN 5 MG PO TABS
5.0000 mg | ORAL_TABLET | Freq: Two times a day (BID) | ORAL | Status: DC
  Administered 2022-04-22 – 2022-04-25 (×6): 5 mg via ORAL
  Filled 2022-04-22 (×6): qty 1

## 2022-04-22 MED ORDER — SODIUM CHLORIDE 0.9 % IV SOLN
12.5000 mg | Freq: Four times a day (QID) | INTRAVENOUS | Status: DC | PRN
  Administered 2022-04-22: 12.5 mg via INTRAVENOUS
  Filled 2022-04-22: qty 12.5

## 2022-04-22 MED ORDER — NALOXONE HCL 0.4 MG/ML IJ SOLN: 0.4000 mg | INTRAMUSCULAR | Status: DC | PRN

## 2022-04-22 MED ORDER — PROMETHAZINE HCL 12.5 MG PO TABS: 25.0000 mg | ORAL_TABLET | Freq: Four times a day (QID) | ORAL | 0 refills | Status: DC | PRN

## 2022-04-22 MED ORDER — SODIUM CHLORIDE 0.9 % IV SOLN
25.0000 mg | Freq: Once | INTRAVENOUS | Status: AC
  Administered 2022-04-22: 25 mg via INTRAVENOUS
  Filled 2022-04-22: qty 1

## 2022-04-22 MED ORDER — INSULIN ASPART 100 UNIT/ML IJ SOLN
10.0000 [IU] | Freq: Once | INTRAMUSCULAR | Status: AC
  Administered 2022-04-22: 10 [IU] via SUBCUTANEOUS
  Filled 2022-04-22: qty 1
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
## 2022-04-22 NOTE — Telephone Encounter (Signed)
Patient was last seen on 11-22   Recurring Treatments   Type Plan Dates Plan Provider     LUNG Atezolizumab + Bevacizumab Maintenance q21d ONCOLOGY TREATMENT  12/05/2021 - Present [name], MD

## 2022-04-22 NOTE — ED Provider Notes (Signed)
[facility] DEPT Provider Note   CSN: [id number] Arrival date & time: 04/22/22  1652     History  Chief Complaint  Patient presents with   Emesis    Troy [name] is a 66 y.o. male.  66 year old male with history of stage IV liver cancer presents due to tachycardia as well as hyperglycemia.  Patient states has been going on for several days.  Notes that he has been having diffuse abdominal discomfort.  His emesis which I did look at it is dark in nature.  He does take Eliquis chronically.  Denies any fever or chills.  Was brought to the ED from the cancer center by the provider there who I spoke with.  Was treated with 1 L of fluids as well as given Phenergan states that his nausea has improved.  Denies any urinary symptoms was also given 11 units of insulin for CBG over 300.      Home Medications Prior to Admission medications   Medication Sig Start Date End Date Taking? Authorizing Provider  amLODipine (NORVASC) 10 MG tablet Take 10 mg by mouth daily. 07/22/21 07/23/22  [provider]  apixaban (ELIQUIS) 5 MG TABS tablet Take 1 tablet (5 mg total) by mouth 2 (two) times daily. 03/06/22   [name], MD  carboxymethylcellulose (REFRESH PLUS) 0.5 % SOLN 1 drop 4 (four) times daily as needed (dry eyes).    [provider]  Cholecalciferol 50 MCG (2000 UT) TABS Take 4,000 Units by mouth every Monday, Wednesday, and Friday. 01/02/10   [provider]  Dexlansoprazole 30 MG capsule DR Take 1 capsule (30 mg total) by mouth daily. 12/10/21   Alla Feeling, NP  docusate sodium (COLACE) 100 MG capsule Take 1 capsule (100 mg total) by mouth 2 (two) times daily. 01/23/22   Tyson Alias, NP  empagliflozin (JARDIANCE) 25 MG TABS tablet Take 25 mg by mouth daily. 11/04/19   [provider]  hydrocortisone 1 % ointment Apply 1 Application topically 2 (two) times daily as needed for itching.    [provider]  insulin  glargine (LANTUS) 100 UNIT/ML injection Inject 0.15 mLs (15 Units total) into the skin daily. Patient taking differently: Inject 30 Units into the skin daily. 11/04/21   Dwyane Dee, MD  lidocaine-prilocaine (EMLA) cream Apply 1 Application topically as needed. 04/02/22   Heilingoetter, Cassandra L, PA-C  lisinopril (PRINIVIL,ZESTRIL) 40 MG tablet Take 40 mg by mouth daily.    [provider]  metFORMIN (GLUCOPHAGE) 850 MG tablet Take 850 mg by mouth daily.    [provider]  mirtazapine (REMERON) 7.5 MG tablet Take 1 tablet (7.5 mg total) by mouth at bedtime. 12/10/21   Alla Feeling, NP  morphine (MS CONTIN) 15 MG 12 hr tablet Take 1 tablet (15 mg total) by mouth every 12 (twelve) hours. 02/19/22   Pickenpack-Cousar, Carlena Sax, NP  morphine 20 MG/5ML solution Take 0.6-1.3 mLs (2.4-5.2 mg total) by mouth every 4 (four) hours as needed for pain. 03/18/22   Pickenpack-Cousar, Carlena Sax, NP  nystatin (MYCOSTATIN) 100000 UNIT/ML suspension Take 5 mLs (500,000 Units total) by mouth 4 (four) times daily. 11/25/21   [name], MD  ondansetron (ZOFRAN) 4 MG tablet Take 1 tablet (4 mg total) by mouth every 4 (four) hours. 01/23/22   Tyson Alias, NP  ondansetron (ZOFRAN) 8 MG tablet Take 1 tablet (8 mg total) by mouth every 8 (eight) hours as needed for nausea or vomiting. 04/02/22  Heilingoetter, Cassandra L, PA-C  oxyCODONE (ROXICODONE) 5 MG immediate release tablet Take 2 tablets (10 mg total) by mouth every 6 (six) hours. 01/23/22   Tyson Alias, NP  polyethylene glycol powder (MIRALAX) 17 GM/SCOOP powder Take 255 g by mouth daily. 11/26/21   Pickenpack-Cousar, Carlena Sax, NP  promethazine (PHENERGAN) 12.5 MG tablet Take 2 tablets (25 mg total) by mouth every 6 (six) hours as needed for refractory nausea / vomiting. 04/22/22   [name], MD  prochlorperazine (COMPAZINE) 10 MG tablet Take 1 tablet (10 mg total) by mouth every 6 (six) hours as needed (Nausea or vomiting). 12/03/21 01/16/22   [name], MD      Allergies    Patient has no known allergies.    Review of Systems   Review of Systems  All other systems reviewed and are negative.   Physical Exam Updated Vital Signs BP (!) 162/86   Pulse (!) 118   Temp 97.9 F (36.6 C) (Oral)   Resp 14   Ht 1.778 m ('5\' 10"'$ )   Wt 59 kg   SpO2 100%   BMI 18.66 kg/m  Physical Exam Vitals and nursing note reviewed.  Constitutional:      General: He is not in acute distress.    Appearance: He is cachectic. He is not toxic-appearing.  HENT:     Head: Normocephalic and atraumatic.  Eyes:     General: Lids are normal.     Conjunctiva/sclera: Conjunctivae normal.     Pupils: Pupils are equal, round, and reactive to light.  Neck:     Thyroid: No thyroid mass.     Trachea: No tracheal deviation.  Cardiovascular:     Rate and Rhythm: Normal rate and regular rhythm.     Heart sounds: Normal heart sounds. No murmur heard.    No gallop.  Pulmonary:     Effort: Pulmonary effort is normal. No respiratory distress.     Breath sounds: Normal breath sounds. No stridor. No decreased breath sounds, wheezing, rhonchi or rales.  Abdominal:     Palpations: Abdomen is soft.     Tenderness: There is generalized abdominal tenderness. There is no rebound.  Musculoskeletal:        General: No tenderness. Normal range of motion.     Cervical back: Normal range of motion and neck supple.  Skin:    General: Skin is warm and dry.     Findings: No abrasion or rash.  Neurological:     Mental Status: He is oriented to person, place, and time. Mental status is at baseline.     GCS: GCS eye subscore is 4. GCS verbal subscore is 5. GCS motor subscore is 6.     Cranial Nerves: No cranial nerve deficit.     Sensory: No sensory deficit.     Motor: Motor function is intact.  Psychiatric:        Attention and Perception: Attention normal.        Speech: Speech normal.        Behavior: Behavior normal. Behavior is cooperative.    ED Results /  Procedures / Treatments   Labs (all labs ordered are listed, but only abnormal results are displayed) Labs Reviewed - No data to display  EKG None  Radiology No results found.  Procedures Procedures    Medications Ordered in ED Medications  0.9 %  sodium chloride infusion (has no administration in time range)  sodium chloride 0.9 % bolus 1,000 mL (has no  administration in time range)    ED Course/ Medical Decision Making/ A&P                           Medical Decision Making Amount and/or Complexity of Data Reviewed Labs: ordered. Radiology: ordered. ECG/medicine tests: ordered.  Risk Prescription drug management.   Patient presenting with weakness and concerns for dehydration.  Patient had labs done prior to arrival here with treatment as mentioned above.  Will recheck sugar here.  Patient is tachycardic here.  After IV fluids he remains still tachycardic.  Endorses weakness.  Will add ammonia level.  He will require admission.  Will consult hospital service        Final Clinical Impression(s) / ED Diagnoses Final diagnoses:  None    Rx / DC Orders ED Discharge Orders     None         Lacretia Leigh, MD 04/22/22 2025
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
## 2022-04-22 NOTE — Patient Instructions (Signed)

## 2022-04-22 NOTE — Telephone Encounter (Signed)
Patients wife called this morning patient has been vomitting all night wanted to know if she should take him to ER or can she bring him into symptom management.
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
## 2022-04-22 NOTE — ED Triage Notes (Addendum)
Pt  to ed from cancer center with tachycardia. Pt reports vomiting episode this morning states he feels nauseous and is having lower abd pain. Pt was given 1L fluids, 11ubits of Insulin for cbg >300 and phenergan pta

## 2022-04-23 ENCOUNTER — Ambulatory Visit: Payer: No Typology Code available for payment source | Admitting: Hematology

## 2022-04-23 ENCOUNTER — Encounter (HOSPITAL_COMMUNITY): Payer: Self-pay | Admitting: Internal Medicine

## 2022-04-23 ENCOUNTER — Encounter: Payer: No Typology Code available for payment source | Admitting: Dietician

## 2022-04-23 ENCOUNTER — Ambulatory Visit: Payer: No Typology Code available for payment source

## 2022-04-23 ENCOUNTER — Other Ambulatory Visit: Payer: Self-pay

## 2022-04-23 ENCOUNTER — Other Ambulatory Visit: Payer: No Typology Code available for payment source

## 2022-04-23 DIAGNOSIS — Z800 Family history of malignant neoplasm of digestive organs: Secondary | ICD-10-CM | POA: Diagnosis not present

## 2022-04-23 DIAGNOSIS — C220 Liver cell carcinoma: Secondary | ICD-10-CM | POA: Diagnosis present

## 2022-04-23 DIAGNOSIS — R1110 Vomiting, unspecified: Secondary | ICD-10-CM | POA: Diagnosis present

## 2022-04-23 DIAGNOSIS — G893 Neoplasm related pain (acute) (chronic): Secondary | ICD-10-CM | POA: Diagnosis present

## 2022-04-23 DIAGNOSIS — E860 Dehydration: Secondary | ICD-10-CM | POA: Diagnosis present

## 2022-04-23 DIAGNOSIS — K7460 Unspecified cirrhosis of liver: Secondary | ICD-10-CM | POA: Diagnosis present

## 2022-04-23 DIAGNOSIS — Z8249 Family history of ischemic heart disease and other diseases of the circulatory system: Secondary | ICD-10-CM | POA: Diagnosis not present

## 2022-04-23 DIAGNOSIS — Z681 Body mass index (BMI) 19 or less, adult: Secondary | ICD-10-CM | POA: Diagnosis not present

## 2022-04-23 DIAGNOSIS — R5383 Other fatigue: Secondary | ICD-10-CM

## 2022-04-23 DIAGNOSIS — K5903 Drug induced constipation: Secondary | ICD-10-CM | POA: Diagnosis present

## 2022-04-23 DIAGNOSIS — R112 Nausea with vomiting, unspecified: Secondary | ICD-10-CM | POA: Diagnosis present

## 2022-04-23 DIAGNOSIS — E7800 Pure hypercholesterolemia, unspecified: Secondary | ICD-10-CM | POA: Diagnosis present

## 2022-04-23 DIAGNOSIS — K219 Gastro-esophageal reflux disease without esophagitis: Secondary | ICD-10-CM | POA: Diagnosis present

## 2022-04-23 DIAGNOSIS — E1165 Type 2 diabetes mellitus with hyperglycemia: Secondary | ICD-10-CM | POA: Diagnosis present

## 2022-04-23 DIAGNOSIS — R64 Cachexia: Secondary | ICD-10-CM | POA: Diagnosis present

## 2022-04-23 DIAGNOSIS — T402X5A Adverse effect of other opioids, initial encounter: Secondary | ICD-10-CM | POA: Diagnosis present

## 2022-04-23 DIAGNOSIS — Z7901 Long term (current) use of anticoagulants: Secondary | ICD-10-CM | POA: Diagnosis not present

## 2022-04-23 DIAGNOSIS — Z794 Long term (current) use of insulin: Secondary | ICD-10-CM | POA: Diagnosis not present

## 2022-04-23 DIAGNOSIS — Z79899 Other long term (current) drug therapy: Secondary | ICD-10-CM | POA: Diagnosis not present

## 2022-04-23 DIAGNOSIS — R9431 Abnormal electrocardiogram [ECG] [EKG]: Secondary | ICD-10-CM | POA: Diagnosis not present

## 2022-04-23 DIAGNOSIS — I81 Portal vein thrombosis: Secondary | ICD-10-CM | POA: Diagnosis present

## 2022-04-23 DIAGNOSIS — R000 Tachycardia, unspecified: Secondary | ICD-10-CM | POA: Diagnosis present

## 2022-04-23 DIAGNOSIS — D6959 Other secondary thrombocytopenia: Secondary | ICD-10-CM | POA: Diagnosis present

## 2022-04-23 DIAGNOSIS — K766 Portal hypertension: Secondary | ICD-10-CM | POA: Diagnosis present

## 2022-04-23 DIAGNOSIS — I10 Essential (primary) hypertension: Secondary | ICD-10-CM | POA: Diagnosis present

## 2022-04-23 DIAGNOSIS — Z1152 Encounter for screening for COVID-19: Secondary | ICD-10-CM | POA: Diagnosis not present

## 2022-04-23 LAB — COMPREHENSIVE METABOLIC PANEL
ALT: 46 U/L — ABNORMAL HIGH (ref 0–44)
AST: 84 U/L — ABNORMAL HIGH (ref 15–41)
Albumin: 3 g/dL — ABNORMAL LOW (ref 3.5–5.0)
Alkaline Phosphatase: 158 U/L — ABNORMAL HIGH (ref 38–126)
Anion gap: 10 (ref 5–15)
BUN: 23 mg/dL (ref 8–23)
CO2: 21 mmol/L — ABNORMAL LOW (ref 22–32)
Calcium: 9.3 mg/dL (ref 8.9–10.3)
Chloride: 113 mmol/L — ABNORMAL HIGH (ref 98–111)
Creatinine, Ser: 1.05 mg/dL (ref 0.61–1.24)
GFR, Estimated: >60 mL/min (ref >60)
Glucose, Bld: 183 mg/dL — ABNORMAL HIGH (ref 70–99)
Potassium: 3.9 mmol/L (ref 3.5–5.1)
Sodium: 144 mmol/L (ref 135–145)
Total Bilirubin: 1.2 mg/dL (ref 0.3–1.2)
Total Protein: 7.2 g/dL (ref 6.5–8.1)

## 2022-04-23 LAB — GLUCOSE, CAPILLARY
Glucose-Capillary: 137 mg/dL — ABNORMAL HIGH (ref 70–99)
Glucose-Capillary: 139 mg/dL — ABNORMAL HIGH (ref 70–99)
Glucose-Capillary: 120 mg/dL — ABNORMAL HIGH (ref 70–99)

## 2022-04-23 LAB — CBC
HCT: 36.5 % — ABNORMAL LOW (ref 39.0–52.0)
Hemoglobin: 11.6 g/dL — ABNORMAL LOW (ref 13.0–17.0)
MCH: 29.1 pg (ref 26.0–34.0)
MCHC: 31.8 g/dL (ref 30.0–36.0)
MCV: 91.7 fL (ref 80.0–100.0)
Platelets: 103 10*3/uL — ABNORMAL LOW (ref 150–400)
RBC: 3.98 MIL/uL — ABNORMAL LOW (ref 4.22–5.81)
RDW: 15.9 % — ABNORMAL HIGH (ref 11.5–15.5)
WBC: 7.4 10*3/uL (ref 4.0–10.5)
nRBC: 0 % (ref 0.0–0.2)

## 2022-04-23 LAB — CBG MONITORING, ED
Glucose-Capillary: 195 mg/dL — ABNORMAL HIGH (ref 70–99)
Glucose-Capillary: 170 mg/dL — ABNORMAL HIGH (ref 70–99)
Glucose-Capillary: 186 mg/dL — ABNORMAL HIGH (ref 70–99)

## 2022-04-23 MED ORDER — SODIUM CHLORIDE 0.9% FLUSH
10.0000 mL | INTRAVENOUS | Status: DC | PRN
  Administered 2022-04-25: 10 mL

## 2022-04-23 MED ORDER — SENNOSIDES-DOCUSATE SODIUM 8.6-50 MG PO TABS
1.0000 | ORAL_TABLET | Freq: Two times a day (BID) | ORAL | Status: DC
  Administered 2022-04-23 – 2022-04-25 (×5): 1 via ORAL
  Filled 2022-04-23 (×5): qty 1

## 2022-04-23 MED ORDER — POLYETHYLENE GLYCOL 3350 17 G PO PACK: 17.0000 g | PACK | Freq: Every day | ORAL | Status: DC | PRN

## 2022-04-23 MED ORDER — CHLORHEXIDINE GLUCONATE CLOTH 2 % EX PADS
6.0000 | MEDICATED_PAD | Freq: Every day | CUTANEOUS | Status: DC
  Administered 2022-04-24 – 2022-04-25 (×2): 6 via TOPICAL

## 2022-04-23 MED ORDER — SODIUM CHLORIDE 0.9 % IV SOLN: INTRAVENOUS | Status: DC

## 2022-04-23 MED ORDER — SODIUM CHLORIDE 0.9% FLUSH
10.0000 mL | Freq: Two times a day (BID) | INTRAVENOUS | Status: DC
  Administered 2022-04-24 – 2022-04-25 (×2): 10 mL

## 2022-04-23 NOTE — Progress Notes (Signed)
   04/23/22 1737  Assess: MEWS Score  Temp 99.4 F (37.4 C)  BP 121/76  MAP (mmHg) 90  Pulse Rate (!) 118  Resp 20  SpO2 100 %  O2 Device Room Air  Assess: MEWS Score  MEWS Temp 0  MEWS Systolic 0  MEWS Pulse 2  MEWS RR 0  MEWS LOC 0  MEWS Score 2  MEWS Score Color Yellow  Assess: if the MEWS score is Yellow or Red  Were vital signs taken at a resting state? Yes  Focused Assessment No change from prior assessment  Does the patient meet 2 or more of the SIRS criteria? No  MEWS guidelines implemented *See Row Information* Yes  Take Vital Signs  Increase Vital Sign Frequency  Yellow: Q 2hr X 2 then Q 4hr X 2, if remains yellow, continue Q 4hrs  Escalate  MEWS: Escalate Yellow: discuss with charge nurse/RN and consider discussing with provider and RRT  Notify: Charge Nurse/RN  Name of Charge Nurse/RN Notified Abby, Rn  Date Charge Nurse/RN Notified 04/23/22  Time Charge Nurse/RN Notified 1744  Assess: SIRS CRITERIA  SIRS Temperature  0  SIRS Pulse 1  SIRS Respirations  0  SIRS WBC 0  SIRS Score Sum  1

## 2022-04-23 NOTE — ED Notes (Signed)
CBG done 195

## 2022-04-23 NOTE — Progress Notes (Signed)
Pt ambulated to the bathroom, stated after he flushed that he had seen some bright red blood in his stool when he went to the bathroom. Instructed pt that next time he needs to have a bowel movement to let us know so we can see it, and also notified Dr Pietro Cassis as well.

## 2022-04-23 NOTE — Progress Notes (Signed)
PROGRESS NOTE  Ko Bardon  DOB: [birth year]  PCP: [name], MD PJK:[id number]  DOA: 04/22/2022  LOS: 0 days  Hospital Day: 2  Brief narrative: Troy [name] is a 66 y.o. male with PMH significant for stage IV hepatocellular carcinoma currently on treatment with atezolizumab and bevacizumab, history of hepatitis C, cirrhosis, varices, portal vein thrombosis on Eliquis, hypertension, hyperlipidemia, insulin-dependent type 2 diabetes, GERD.   Last chemotherapy on 11/22.  He has chronic abdominal pain, chronic constipation and takes morphine and bowel regimen at home.   12/12 per family, patient started having nausea, vomiting, generalized abdominal pain, poor oral intake and generalized weakness and hence presented to cancer center for evaluation. At the cancer center, he was noted to be tachycardic.  Labs showed elevated blood glucose.  He was given IV fluid, IV Phenergan, IV insulin. Sent to ED for further evaluation. In the ED, patient was tachycardic to 120s, afebrile, blood pressure and O2 sat maintained  COVID and influenza PCR negative.   CBG improved to 138.   CT abdomen pelvis did not show any acute intra-abdominal pathology.   Patient was given supportive care with IV fluid, IV pain meds, IV antiemetics . Kept in observation under hospitalist service  Subjective: Patient was seen and examined this morning.  Pleasant elderly African-American male.  Lying down in bed.  Not in distress.  Son at bedside.  Feels weak.  Has not had a chance to take his breakfast this morning.  Running mildly tachycardic. Chart reviewed Overnight, afebrile, remains tachycardic up to 120, blood pressure in 140s and 160s, breathing on room air Last set of labs from this morning with glucose level at 183, mildly elevated liver enzymes  Assessment and plan: Acute on chronic abdominal pain Nausea, vomiting Patient has chronic abdominal pain and chronic constipation for which he is on morphine and bowel  regimen.  Labs and imaging did not suggest any new clear etiology of acute flareup of symptoms. Currently on supportive care with IV fluid, IV antiemetics, IV morphine. Continue long-term pain regimen with MS Contin 15 mg twice daily, as needed morphine solution  Start Senokot scheduled and MiraLAX as needed  Sinus tachycardia Likely secondary to severe dehydration.  No evidence of arrhythmia on EKG.   Chronically anticoagulated.  Not hypoxic.  COVID and influenza PCR negative Currently on IV hydration.     Hypercalcemia Likely related to underlying malignancy and dehydration.  Calcium 11.1, albumin normal. Improved level with IV fluid Recent Labs  Lab 04/22/22 1419 04/23/22 0449  CALCIUM 11.1* 9.3    Mild thrombocytopenia Likely related to underlying malignancy.  Platelet count 119k. No signs of active bleeding. Continue to monitor Recent Labs  Lab 04/22/22 1419 04/23/22 0449  PLT 119* 103*     Stage IV hepatocellular carcinoma History of hepatitis C History of liver cirrhosis, portal vein thrombosis, varices No signs of bleeding.  Continue Eliquis given portal vein thrombosis. Outpatient oncology follow-up  Uncontrolled type 2 diabetes mellitus with hyperglycemia A1c 9.1 on 10/31/2021 Blood sugar level was initially elevated because patient did not take insulin on the day of presentation because of nausea and vomiting. PTA on Lantus 15 units daily, Jardiance 25 mg daily, metformin 850 mg twice daily Currently on Semglee 8 units daily.  Continue sliding scale insulin with Accu-Cheks Recent Labs  Lab 04/22/22 2031 04/23/22 0016 04/23/22 0444 04/23/22 0932  GLUCAP 138* 195* 170* 186*   Hypertension Systolic blood pressure currently in the 150s. Continue amlodipine and lisinopril.  Continue  to monitor blood pressure   GERD Continue Protonix  Goals of care   Code Status: Full Code    Mobility:   Scheduled Meds:  amLODipine  10 mg Oral Daily   apixaban  5 mg  Oral BID   insulin aspart  0-9 Units Subcutaneous Q4H   insulin glargine-yfgn  8 Units Subcutaneous Daily   lisinopril  40 mg Oral Daily   pantoprazole  40 mg Oral BID   senna-docusate  1 tablet Oral BID    PRN meds: morphine injection, naLOXone (NARCAN)  injection, ondansetron (ZOFRAN) IV, polyethylene glycol   Infusions:   sodium chloride       Skin assessment:     Nutritional status:  Body mass index is 18.66 kg/m.          Diet:  Diet Order             Diet Carb Modified Fluid consistency: Thin; Room service appropriate? Yes  Diet effective now                   DVT prophylaxis:   apixaban (ELIQUIS) tablet 5 mg   Antimicrobials: None Fluid: Maintain NS at 75 mill per hour Consultants: None Family Communication: Son at bedside  Status is: Observation  Continue in-hospital care because: Needs IV hydration, continues to feel weak.  Needs PT eval Level of care: Telemetry   Dispo: The patient is from: Home              Anticipated d/c is to: Pending clinical course              Patient currently is not medically stable to d/c.   Difficult to place patient No    Antimicrobials: Anti-infectives (From admission, onward)    None       Objective: Vitals:   04/23/22 0945 04/23/22 0947  BP: (!) 148/90   Pulse: (!) 118   Resp:    Temp:  98.6 F (37 C)  SpO2: 100%     Intake/Output Summary (Last 24 hours) at 04/23/2022 1029 Last data filed at 04/22/2022 1911 Gross per 24 hour  Intake 1047.42 ml  Output --  Net 1047.42 ml   Filed Weights   04/22/22 1709  Weight: 59 kg   Weight change:  Body mass index is 18.66 kg/m.   Physical Exam: General exam: Pleasant, elderly African-American male.  Not in physical pain Skin: No rashes, lesions or ulcers. HEENT: Atraumatic, normocephalic, no obvious bleeding Lungs: Clear to auscultation bilaterally CVS: Sinus tachycardia, no murmur GI/Abd soft, mild diffuse tenderness, bowel sound  present CNS: Alert, awake, slow to respond, oriented to place and person Psychiatry: Sad affect Extremities: No pedal edema, no calf tenderness  Data Review: I have personally reviewed the laboratory data and studies available.  F/u labs ordered Unresulted Labs (From admission, onward)     Start     Ordered   04/23/22 0500  Hemoglobin A1c  Tomorrow morning,   R       Comments: To assess prior glycemic control    04/22/22 2202   04/22/22 2202  Procalcitonin - Baseline  Once,   R        04/22/22 2201   04/22/22 2147  TSH  Once,   R        04/22/22 2153   Unscheduled  CBC with Differential/Platelet  Tomorrow morning,   R        04/23/22 1029   Unscheduled  Basic metabolic panel  Tomorrow morning,   R        04/23/22 1029            Signed, Terrilee Croak, MD [facility] 04/23/2022

## 2022-04-24 ENCOUNTER — Inpatient Hospital Stay (HOSPITAL_COMMUNITY): Payer: No Typology Code available for payment source

## 2022-04-24 ENCOUNTER — Inpatient Hospital Stay: Payer: No Typology Code available for payment source | Admitting: Dietician

## 2022-04-24 ENCOUNTER — Inpatient Hospital Stay: Payer: No Typology Code available for payment source

## 2022-04-24 ENCOUNTER — Inpatient Hospital Stay: Payer: No Typology Code available for payment source | Admitting: Hematology

## 2022-04-24 DIAGNOSIS — R9431 Abnormal electrocardiogram [ECG] [EKG]: Secondary | ICD-10-CM | POA: Diagnosis not present

## 2022-04-24 DIAGNOSIS — C220 Liver cell carcinoma: Secondary | ICD-10-CM

## 2022-04-24 LAB — ECHOCARDIOGRAM COMPLETE
AR max vel: 2.22 cm2
AV Area VTI: 2.55 cm2
AV Area mean vel: 2.22 cm2
AV Mean grad: 4 mmHg
AV Peak grad: 5.9 mmHg
Ao pk vel: 1.21 m/s
Area-P 1/2: 4.06 cm2
Calc EF: 54.4 %
Height: 70 in
S' Lateral: 2.6 cm
Single Plane A2C EF: 54.1 %
Single Plane A4C EF: 57 %
Weight: 2081.14 oz

## 2022-04-24 LAB — CBC WITH DIFFERENTIAL/PLATELET
Abs Immature Granulocytes: 0.03 10*3/uL (ref 0.00–0.07)
Basophils Absolute: 0 10*3/uL (ref 0.0–0.1)
Basophils Relative: 0 %
Eosinophils Absolute: 0.1 10*3/uL (ref 0.0–0.5)
Eosinophils Relative: 1 %
HCT: 36.4 % — ABNORMAL LOW (ref 39.0–52.0)
Hemoglobin: 11.8 g/dL — ABNORMAL LOW (ref 13.0–17.0)
Immature Granulocytes: 1 %
Lymphocytes Relative: 10 %
Lymphs Abs: 0.6 10*3/uL — ABNORMAL LOW (ref 0.7–4.0)
MCH: 29.4 pg (ref 26.0–34.0)
MCHC: 32.4 g/dL (ref 30.0–36.0)
MCV: 90.8 fL (ref 80.0–100.0)
Monocytes Absolute: 0.7 10*3/uL (ref 0.1–1.0)
Monocytes Relative: 12 %
Neutro Abs: 4.5 10*3/uL (ref 1.7–7.7)
Neutrophils Relative %: 76 %
Platelets: 93 10*3/uL — ABNORMAL LOW (ref 150–400)
RBC: 4.01 MIL/uL — ABNORMAL LOW (ref 4.22–5.81)
RDW: 15.7 % — ABNORMAL HIGH (ref 11.5–15.5)
WBC: 5.9 10*3/uL (ref 4.0–10.5)
nRBC: 0 % (ref 0.0–0.2)

## 2022-04-24 LAB — BASIC METABOLIC PANEL
Anion gap: 9 (ref 5–15)
BUN: 19 mg/dL (ref 8–23)
CO2: 20 mmol/L — ABNORMAL LOW (ref 22–32)
Calcium: 8.9 mg/dL (ref 8.9–10.3)
Chloride: 112 mmol/L — ABNORMAL HIGH (ref 98–111)
Creatinine, Ser: 0.94 mg/dL (ref 0.61–1.24)
GFR, Estimated: >60 mL/min (ref >60)
Glucose, Bld: 107 mg/dL — ABNORMAL HIGH (ref 70–99)
Potassium: 3.5 mmol/L (ref 3.5–5.1)
Sodium: 141 mmol/L (ref 135–145)

## 2022-04-24 LAB — GLUCOSE, CAPILLARY
Glucose-Capillary: 100 mg/dL — ABNORMAL HIGH (ref 70–99)
Glucose-Capillary: 104 mg/dL — ABNORMAL HIGH (ref 70–99)
Glucose-Capillary: 104 mg/dL — ABNORMAL HIGH (ref 70–99)
Glucose-Capillary: 122 mg/dL — ABNORMAL HIGH (ref 70–99)
Glucose-Capillary: 122 mg/dL — ABNORMAL HIGH (ref 70–99)
Glucose-Capillary: 127 mg/dL — ABNORMAL HIGH (ref 70–99)
Glucose-Capillary: 150 mg/dL — ABNORMAL HIGH (ref 70–99)
Glucose-Capillary: 92 mg/dL (ref 70–99)

## 2022-04-24 LAB — HEMOGLOBIN A1C
Hgb A1c MFr Bld: 6.9 % — ABNORMAL HIGH (ref 4.8–5.6)
Mean Plasma Glucose: 151 mg/dL

## 2022-04-24 MED ORDER — MORPHINE SULFATE (CONCENTRATE) 10 MG/0.5ML PO SOLN
2.5000 mg | ORAL | Status: DC | PRN
  Administered 2022-04-24: 5 mg via ORAL
  Filled 2022-04-24: qty 0.5

## 2022-04-24 MED ORDER — MORPHINE SULFATE ER 15 MG PO TBCR
15.0000 mg | EXTENDED_RELEASE_TABLET | Freq: Two times a day (BID) | ORAL | Status: DC
  Administered 2022-04-24 – 2022-04-25 (×3): 15 mg via ORAL
  Filled 2022-04-24 (×3): qty 1

## 2022-04-24 MED ORDER — MIRTAZAPINE 15 MG PO TABS
7.5000 mg | ORAL_TABLET | Freq: Every day | ORAL | Status: DC
  Administered 2022-04-24: 7.5 mg via ORAL
  Filled 2022-04-24: qty 1

## 2022-04-24 NOTE — Evaluation (Signed)
Physical Therapy Evaluation Patient Details Name: Troy [name] MRN: [id number] DOB: [birth year] Today's Date: 04/24/2022  History of Present Illness  Pt is a 66 y/o M admitted on 04/22/22 after presenting with c/o N&V, abdominal pain, poor oral intake, generalized weakness, and elevated HR. PMH: stage IV hepatocellular carcinoma currently on tx, Hep C, cirrhosis, varices, portal vein thrombosis on eliquis, HTN, HLD, IDDM2, GERD  Clinical Impression  Pt seen for PT evaluation with pt agreeable to tx. Prior to admission pt resided in a 2 level home with stairs to access bed/bathroom & pt primarily ambulated without AD (did use SPC PRN though). On this date, pt is able to ambulate without AD with supervision<>CGA but does ambulate significantly increased distances with RW. PT educated pt on use of RW for energy conservation, as well as importance of mobility while in acute setting. Pt would benefit from PT intervention to address high level balance & gait to progress to gait without AD to return to PLOF, as well as being seen by mobility specialists.    Recommendations for follow up therapy are one component of a multi-disciplinary discharge planning process, led by the attending physician.  Recommendations may be updated based on patient status, additional functional criteria and insurance authorization.  Follow Up Recommendations No PT follow up      Assistance Recommended at Discharge Set up Supervision/Assistance  Patient can return home with the following  Assistance with cooking/housework;Help with stairs or ramp for entrance    Equipment Recommendations Rolling walker (2 wheels)  Recommendations for Other Services       Functional Status Assessment Patient has had a recent decline in their functional status and demonstrates the ability to make significant improvements in function in a reasonable and predictable amount of time.     Precautions / Restrictions Precautions Precautions:  Fall Restrictions Weight Bearing Restrictions: No      Mobility  Bed Mobility Overal bed mobility: Independent             General bed mobility comments: supine<>sit with HOB elevated    Transfers Overall transfer level: Independent                 General transfer comment: STS from EOB & recliner    Ambulation/Gait Ambulation/Gait assistance: Supervision, Min guard Gait Distance (Feet):  (>200 ft (pt ambulates 1 lap around entire unit) with RW + 10 ft without AD) Assistive device: Rolling walker (2 wheels), None Gait Pattern/deviations: WFL(Within Functional Limits) Gait velocity: WNL     General Gait Details: ambulates with RW with supervision, without AD with CGA  Stairs            Wheelchair Mobility    Modified Rankin (Stroke Patients Only)       Balance Overall balance assessment: Needs assistance Sitting-balance support: Feet supported Sitting balance-Leahy Scale: Good     Standing balance support: No upper extremity supported, During functional activity Standing balance-Leahy Scale: Good Standing balance comment: Pt stands to void with urinal without LOB.                             Pertinent Vitals/Pain Pain Assessment Pain Assessment: No/denies pain    Home Living Family/patient expects to be discharged to:: Private residence Living Arrangements: Spouse/significant other Available Help at Discharge: Family Type of Home: House Home Access:  (threshold step)     Alternate Level Stairs-Number of Steps: full flight Home Layout: Two level Home Equipment:  Cane - single point      Prior Function Prior Level of Function : Independent/Modified Independent             Mobility Comments: Pt is independent without AD, PRN use of SPC, denies falls in past 6 months but wife does note pt with decreased balance & weakness since undergoing treatment.       Hand Dominance        Extremity/Trunk Assessment   Upper  Extremity Assessment Upper Extremity Assessment: Overall WFL for tasks assessed    Lower Extremity Assessment Lower Extremity Assessment: Generalized weakness       Communication      Cognition Arousal/Alertness: Awake/alert Behavior During Therapy: WFL for tasks assessed/performed, Flat affect Overall Cognitive Status: Within Functional Limits for tasks assessed                                          General Comments General comments (skin integrity, edema, etc.): HR 114-133 bpm during mobility    Exercises     Assessment/Plan    PT Assessment Patient needs continued PT services  PT Problem List Decreased strength;Decreased activity tolerance;Decreased balance;Decreased mobility;Decreased knowledge of use of DME;Decreased safety awareness       PT Treatment Interventions DME instruction;Therapeutic exercise;Gait training;Balance training;Stair training;Neuromuscular re-education;Functional mobility training;Therapeutic activities;Patient/family education    PT Goals (Current goals can be found in the Care Plan section)  Acute Rehab PT Goals Patient Stated Goal: feel better PT Goal Formulation: With patient Time For Goal Achievement: 05/08/22 Potential to Achieve Goals: Good    Frequency Min 2X/week     Co-evaluation               AM-PAC PT "6 Clicks" Mobility  Outcome Measure Help needed turning from your back to your side while in a flat bed without using bedrails?: None Help needed moving from lying on your back to sitting on the side of a flat bed without using bedrails?: None Help needed moving to and from a bed to a chair (including a wheelchair)?: A Little Help needed standing up from a chair using your arms (e.g., wheelchair or bedside chair)?: None Help needed to walk in hospital room?: A Little Help needed climbing 3-5 steps with a railing? : A Little 6 Click Score: 21    End of Session   Activity Tolerance: Patient tolerated  treatment well;Patient limited by fatigue Patient left: in chair;with call bell/phone within reach;with family/visitor present   PT Visit Diagnosis: Unsteadiness on feet (R26.81);Muscle weakness (generalized) (M62.81)    Time: 9373-4287 PT Time Calculation (min) (ACUTE ONLY): 16 min   Charges:   PT Evaluation $PT Eval Moderate Complexity: [place], PT, DPT 04/24/22, 9:41 AM   Waunita Schooner 04/24/2022, 9:39 AM

## 2022-04-24 NOTE — Progress Notes (Signed)
Troy [name]   DOB:October 24, 1955   XA#:[id number]   MCN#:[id number]  Oncology follow up  Subjective: Patient is well-known to me, under my care for his liver cancer.  He presented with worsening nausea and vomiting, was admitted for symptom management. He feels better overall since admission, is able to eat and drink, and had BM today.    Objective:  Vitals:   04/24/22 1324 04/24/22 2032  BP: 120/87 (!) 126/92  Pulse: (!) 117 (!) 108  Resp: 20   Temp: 98.2 F (36.8 C) 98.1 F (36.7 C)  SpO2: 98% 99%    Body mass index is 18.66 kg/m.  Intake/Output Summary (Last 24 hours) at 04/24/2022 2248 Last data filed at 04/24/2022 1725 Gross per 24 hour  Intake 1183.77 ml  Output 900 ml  Net 283.77 ml     Sclerae unicteric  Oropharynx clear  No peripheral adenopathy  Lungs clear -- no rales or rhonchi  Heart regular rate and rhythm  Abdomen benign  MSK no focal spinal tenderness, no peripheral edema  Neuro nonfocal    CBG (last 3)  Recent Labs    04/24/22 1123 04/24/22 1519 04/24/22 2038  GLUCAP 122*  122* 100* 104*     Labs:   Urine Studies No results for input(s): "UHGB", "CRYS" in the last 72 hours.  Invalid input(s): "UACOL", "UAPR", "USPG", "UPH", "UTP", "UGL", "UKET", "UBIL", "UNIT", "UROB", "ULEU", "UEPI", "UWBC", "URBC", "UBAC", "CAST", "UCOM", "BILUA"  Basic Metabolic Panel: Recent Labs  Lab 04/22/22 1419 04/23/22 0449 04/24/22 0248  NA 144 144 141  K 4.3 3.9 3.5  CL 106 113* 112*  CO2 23 21* 20*  GLUCOSE 347* 183* 107*  BUN '21 23 19  '$ CREATININE 1.20 1.05 0.94  CALCIUM 11.1* 9.3 8.9   GFR Estimated Creatinine Clearance: 62.8 mL/min (by C-G formula based on SCr of 0.94 mg/dL). Liver Function Tests: Recent Labs  Lab 04/22/22 1419 04/23/22 0449  AST 102* 84*  ALT 55* 46*  ALKPHOS 226* 158*  BILITOT 0.9 1.2  PROT 8.3* 7.2  ALBUMIN 4.0 3.0*   No results for input(s): "LIPASE", "AMYLASE" in the last 168 hours. Recent Labs  Lab 04/22/22 2050   AMMONIA 32   Coagulation profile No results for input(s): "INR", "PROTIME" in the last 168 hours.  CBC: Recent Labs  Lab 04/22/22 1419 04/23/22 0449 04/24/22 0248  WBC 6.6 7.4 5.9  NEUTROABS 5.8  --  4.5  HGB 13.4 11.6* 11.8*  HCT 39.9 36.5* 36.4*  MCV 88.9 91.7 90.8  PLT 119* 103* 93*   Cardiac Enzymes: No results for input(s): "CKTOTAL", "CKMB", "CKMBINDEX", "TROPONINI" in the last 168 hours. BNP: Invalid input(s): "POCBNP" CBG: Recent Labs  Lab 04/24/22 0406 04/24/22 0729 04/24/22 1123 04/24/22 1519 04/24/22 2038  GLUCAP 92 104* 122*  122* 100* 104*   D-Dimer No results for input(s): "DDIMER" in the last 72 hours. Hgb A1c Recent Labs    04/23/22 0449  HGBA1C 6.9*   Lipid Profile No results for input(s): "CHOL", "HDL", "LDLCALC", "TRIG", "CHOLHDL", "LDLDIRECT" in the last 72 hours. Thyroid function studies No results for input(s): "TSH", "T4TOTAL", "T3FREE", "THYROIDAB" in the last 72 hours.  Invalid input(s): "FREET3" Anemia work up No results for input(s): "VITAMINB12", "FOLATE", "FERRITIN", "TIBC", "IRON", "RETICCTPCT" in the last 72 hours. Microbiology Recent Results (from the past 240 hour(s))  Resp panel by RT-PCR (RSV, Flu A&B, Covid) Anterior Nasal Swab     Status: None   Collection Time: 04/22/22  7:26 PM   Specimen: Anterior  Nasal Swab  Result Value Ref Range Status   SARS Coronavirus 2 by RT PCR NEGATIVE NEGATIVE Final    Comment: (NOTE) SARS-CoV-2 target nucleic acids are NOT DETECTED.  The SARS-CoV-2 RNA is generally detectable in upper respiratory specimens during the acute phase of infection. The lowest concentration of SARS-CoV-2 viral copies this assay can detect is 138 copies/mL. A negative result does not preclude SARS-Cov-2 infection and should not be used as the sole basis for treatment or other patient management decisions. A negative result may occur with  improper specimen collection/handling, submission of specimen  other than nasopharyngeal swab, presence of viral mutation(s) within the areas targeted by this assay, and inadequate number of viral copies(<138 copies/mL). A negative result must be combined with clinical observations, patient history, and epidemiological information. The expected result is Negative.  Fact Sheet for Patients:  EntrepreneurPulse.com.au  Fact Sheet for Healthcare Providers:  IncredibleEmployment.be  This test is no t yet approved or cleared by the [place] FDA and  has been authorized for detection and/or diagnosis of SARS-CoV-2 by FDA under an Emergency Use Authorization (EUA). This EUA will remain  in effect (meaning this test can be used) for the duration of the COVID-19 declaration under Section 564(b)(1) of the Act, 21 U.S.C.section 360bbb-3(b)(1), unless the authorization is terminated  or revoked sooner.       Influenza A by PCR NEGATIVE NEGATIVE Final   Influenza B by PCR NEGATIVE NEGATIVE Final    Comment: (NOTE) The Xpert Xpress SARS-CoV-2/FLU/RSV plus assay is intended as an aid in the diagnosis of influenza from Nasopharyngeal swab specimens and should not be used as a sole basis for treatment. Nasal washings and aspirates are unacceptable for Xpert Xpress SARS-CoV-2/FLU/RSV testing.  Fact Sheet for Patients: EntrepreneurPulse.com.au  Fact Sheet for Healthcare Providers: IncredibleEmployment.be  This test is not yet approved or cleared by the [place] FDA and has been authorized for detection and/or diagnosis of SARS-CoV-2 by FDA under an Emergency Use Authorization (EUA). This EUA will remain in effect (meaning this test can be used) for the duration of the COVID-19 declaration under Section 564(b)(1) of the Act, 21 U.S.C. section 360bbb-3(b)(1), unless the authorization is terminated or revoked.     Resp Syncytial Virus by PCR NEGATIVE NEGATIVE Final     Comment: (NOTE) Fact Sheet for Patients: EntrepreneurPulse.com.au  Fact Sheet for Healthcare Providers: IncredibleEmployment.be  This test is not yet approved or cleared by the [place] FDA and has been authorized for detection and/or diagnosis of SARS-CoV-2 by FDA under an Emergency Use Authorization (EUA). This EUA will remain in effect (meaning this test can be used) for the duration of the COVID-19 declaration under Section 564(b)(1) of the Act, 21 U.S.C. section 360bbb-3(b)(1), unless the authorization is terminated or revoked.  Performed at [facility], [place] [address], [place], [place] [address]       Studies:  ECHOCARDIOGRAM COMPLETE  Result Date: 04/24/2022    ECHOCARDIOGRAM REPORT   Patient Name:   Troy [name] Date of Exam: 04/24/2022 Medical Rec #:  [id number]     Height:       70.0 in Accession #:    [id number]    Weight:       130.1 lb Date of Birth:  05-12-1956     BSA:          1.739 m Patient Age:    66 years      BP:  120/87 mmHg Patient Gender: M             HR:           112 bpm. Exam Location:  Inpatient Procedure: 2D Echo Indications:    abnormal ECG  History:        Patient has no prior history of Echocardiogram examinations.  Sonographer:    Harvie Junior Referring Phys: [id number] [facility]  Sonographer Comments: Technically difficult study due to poor echo windows. Image acquisition challenging due to respiratory motion. IMPRESSIONS  1. Left ventricular ejection fraction, by estimation, is 60 to 65%. The left ventricle has normal function. The left ventricle has no regional wall motion abnormalities. Left ventricular diastolic parameters are consistent with Grade I diastolic dysfunction (impaired relaxation).  2. Right ventricular systolic function is normal. The right ventricular size is normal. There is normal pulmonary artery systolic pressure. The estimated right ventricular systolic pressure is  29.7 mmHg.  3. The mitral valve is normal in structure. No evidence of mitral valve regurgitation. No evidence of mitral stenosis.  4. The aortic valve is tricuspid. There is mild calcification of the aortic valve. Aortic valve regurgitation is not visualized. No aortic stenosis is present.  5. The inferior vena cava is normal in size with greater than 50% respiratory variability, suggesting right atrial pressure of 3 mmHg.  6. Technically difficult study with poor acoustic windows. FINDINGS  Left Ventricle: Left ventricular ejection fraction, by estimation, is 60 to 65%. The left ventricle has normal function. The left ventricle has no regional wall motion abnormalities. The left ventricular internal cavity size was normal in size. There is  no left ventricular hypertrophy. Left ventricular diastolic parameters are consistent with Grade I diastolic dysfunction (impaired relaxation). Right Ventricle: The right ventricular size is normal. No increase in right ventricular wall thickness. Right ventricular systolic function is normal. There is normal pulmonary artery systolic pressure. The tricuspid regurgitant velocity is 1.70 m/s, and  with an assumed right atrial pressure of 3 mmHg, the estimated right ventricular systolic pressure is 98.9 mmHg. Left Atrium: Left atrial size was normal in size. Right Atrium: Right atrial size was normal in size. Pericardium: There is no evidence of pericardial effusion. Mitral Valve: The mitral valve is normal in structure. Mild mitral annular calcification. No evidence of mitral valve regurgitation. No evidence of mitral valve stenosis. Tricuspid Valve: The tricuspid valve is normal in structure. Tricuspid valve regurgitation is trivial. Aortic Valve: The aortic valve is tricuspid. There is mild calcification of the aortic valve. Aortic valve regurgitation is not visualized. No aortic stenosis is present. Aortic valve mean gradient measures 4.0 mmHg. Aortic valve peak gradient  measures 5.9 mmHg. Aortic valve area, by VTI measures 2.55 cm. Pulmonic Valve: The pulmonic valve was normal in structure. Pulmonic valve regurgitation is not visualized. Aorta: The aortic root is normal in size and structure. Venous: The inferior vena cava is normal in size with greater than 50% respiratory variability, suggesting right atrial pressure of 3 mmHg. IAS/Shunts: No atrial level shunt detected by color flow Doppler.  LEFT VENTRICLE PLAX 2D LVIDd:         3.60 cm     Diastology LVIDs:         2.60 cm     LV e' medial:    7.18 cm/s LV PW:         1.00 cm     LV E/e' medial:  7.4 LV IVS:        1.00  cm     LV e' lateral:   13.50 cm/s LVOT diam:     2.00 cm     LV E/e' lateral: 3.9 LV SV:         56 LV SV Index:   32 LVOT Area:     3.14 cm  LV Volumes (MOD) LV vol d, MOD A2C: 29.4 ml LV vol d, MOD A4C: 38.1 ml LV vol s, MOD A2C: 13.5 ml LV vol s, MOD A4C: 16.4 ml LV SV MOD A2C:     15.9 ml LV SV MOD A4C:     38.1 ml LV SV MOD BP:      19.3 ml RIGHT VENTRICLE RV Basal diam:  2.40 cm RV Mid diam:    2.10 cm TAPSE (M-mode): 1.7 cm LEFT ATRIUM           Index        RIGHT ATRIUM          Index LA diam:      3.00 cm 1.73 cm/m   RA Area:     7.09 cm LA Vol (A4C): 34.4 ml 19.79 ml/m  RA Volume:   11.20 ml 6.44 ml/m  AORTIC VALVE                    PULMONIC VALVE AV Area (Vmax):    2.22 cm     PV Vmax:       1.10 m/s AV Area (Vmean):   2.22 cm     PV Peak grad:  4.8 mmHg AV Area (VTI):     2.55 cm AV Vmax:           121.00 cm/s AV Vmean:          93.300 cm/s AV VTI:            0.218 m AV Peak Grad:      5.9 mmHg AV Mean Grad:      4.0 mmHg LVOT Vmax:         85.50 cm/s LVOT Vmean:        65.900 cm/s LVOT VTI:          0.177 m LVOT/AV VTI ratio: 0.81  AORTA Ao Root diam: 3.10 cm MITRAL VALVE               TRICUSPID VALVE MV Area (PHT): 4.06 cm    TR Peak grad:   11.6 mmHg MV Decel Time: 187 msec    TR Vmax:        170.00 cm/s MV E velocity: 53.10 cm/s MV A velocity: 84.40 cm/s  SHUNTS MV E/A ratio:  0.63         Systemic VTI:  0.18 m                            Systemic Diam: 2.00 cm Dalton McleanMD Electronically signed by Franki Monte Signature Date/Time: 04/24/2022/5:51:51 PM    Final     Assessment: 66 y.o. male   Recurrent nausea and vomiting, improved  Sinus tachycardia Hepatocellular carcinoma, on immunotherapy and bevacizumab Liver cirrhosis, portal vein thrombosis, and history of hepatitis C infection Uncontrolled type 2 diabetes with hyperglycemia  Plan:  -I reviewed his recent CT scan images, which showed significant improvement of his liver cancer, indicating good response to immunotherapy and bevacizumab. -his overall condition improved since he started cancer treatment, but he still has intermittent N/V, and fluctuating appetite. This could  be related to his liver cancer, liver cirrhosis and portal hypertension related gastritis. Continue PPI and antiemetics. If symptoms not well controlled, consider GI consult with [facility].  -He will continue f/u with palliative care in our office after discharge  -I will see him as needed before discharge. -I have canceled his treatment today, will postpone to 1 to 2 weeks.   [name], MD 04/24/2022

## 2022-04-24 NOTE — Progress Notes (Signed)
PROGRESS NOTE  Troy [name]  DOB: 06/15/1955  PCP: [name], MD XKG:[id number]  DOA: 04/22/2022  LOS: 1 day  Hospital Day: 3  Brief narrative: Troy [name] is a 66 y.o. male with PMH significant for stage IV hepatocellular carcinoma currently on treatment with atezolizumab and bevacizumab, history of hepatitis C, cirrhosis, varices, portal vein thrombosis on Eliquis, hypertension, hyperlipidemia, insulin-dependent type 2 diabetes, GERD.   Last chemotherapy on 11/22.  He has chronic abdominal pain, chronic constipation and takes morphine and bowel regimen at home.   12/12 per family, patient started having nausea, vomiting, generalized abdominal pain, poor oral intake and generalized weakness and hence presented to cancer center for evaluation. At the cancer center, he was noted to be tachycardic.  Labs showed elevated blood glucose.  He was given IV fluid, IV Phenergan, IV insulin. Sent to ED for further evaluation. In the ED, patient was tachycardic to 120s, afebrile, blood pressure and O2 sat maintained  COVID and influenza PCR negative.   CBG improved to 138.   CT abdomen pelvis did not show any acute intra-abdominal pathology.   Patient was given supportive care with IV fluid, IV pain meds, IV antiemetics . Kept in observation under hospitalist service  Subjective: Patient was seen and examined this morning.  Lying on bed.  Not in distress.  Breakfast was untouched.  Says he has no appetite.  Feels weak.  Family not at bedside at time of my evaluation.  Assessment and plan: Acute on chronic abdominal pain Nausea, vomiting Patient has chronic abdominal pain and chronic constipation because of [place]. He is on morphine and bowel regimen.  Labs and imaging did not suggest any new clear etiology of acute flareup of symptoms. Currently on supportive care with IV fluid, IV antiemetics, IV morphine. He is also continued on long-term pain regimen with MS Contin 15 mg twice daily, as  needed morphine solution  Start Senokot scheduled and MiraLAX as needed And Remeron for appetite stimulation.  Sinus tachycardia Likely secondary to severe dehydration.  No evidence of arrhythmia on EKG.   Continues to have sinus tachycardia. Chronically anticoagulated.  Not hypoxic.  COVID and influenza PCR negative Currently on IV hydration.   Obtain echocardiogram   Hypercalcemia Likely related to underlying malignancy and dehydration.  Calcium 11.1, albumin normal. Improved calcium level with IV fluid. Recent Labs  Lab 04/22/22 1419 04/23/22 0449 04/24/22 0248  CALCIUM 11.1* 9.3 8.9    Mild thrombocytopenia Likely related to underlying malignancy.  Platelet count 119k. No signs of active bleeding. Continue to monitor Recent Labs  Lab 04/22/22 1419 04/23/22 0449 04/24/22 0248  PLT 119* 103* 93*     Stage IV hepatocellular carcinoma History of hepatitis C History of liver cirrhosis, portal vein thrombosis, varices No signs of bleeding. Continue Eliquis given portal vein thrombosis. Outpatient oncology follow-up.  Uncontrolled type 2 diabetes mellitus with hyperglycemia A1c 9.1 on 10/31/2021 Blood sugar level was initially elevated because patient did not take insulin on the day of presentation because of nausea and vomiting. PTA on Lantus 15 units daily, Jardiance 25 mg daily, metformin 850 mg twice daily Currently on Semglee 8 units daily.  Continue sliding scale insulin with Accu-Cheks Recent Labs  Lab 04/23/22 2017 04/24/22 0003 04/24/22 0406 04/24/22 0729 04/24/22 1123  GLUCAP 120* 127* 92 104* 122*  122*   Hypertension Systolic blood pressure currently in the 150s. Continue amlodipine and lisinopril.  Continue to monitor blood pressure   GERD Continue Protonix  Goals of care  Code Status: Full Code    Mobility: PT eval obtained.  No follow-up recommended  Scheduled Meds:  amLODipine  10 mg Oral Daily   apixaban  5 mg Oral BID   Chlorhexidine  Gluconate Cloth  6 each Topical Daily   insulin aspart  0-9 Units Subcutaneous Q4H   insulin glargine-yfgn  8 Units Subcutaneous Daily   lisinopril  40 mg Oral Daily   mirtazapine  7.5 mg Oral QHS   morphine  15 mg Oral Q12H   pantoprazole  40 mg Oral BID   senna-docusate  1 tablet Oral BID   sodium chloride flush  10-40 mL Intracatheter Q12H    PRN meds: morphine injection, morphine, naLOXone (NARCAN)  injection, ondansetron (ZOFRAN) IV, polyethylene glycol, sodium chloride flush   Infusions:   sodium chloride 75 mL/hr at 04/23/22 2206     Skin assessment:     Nutritional status:  Body mass index is 18.66 kg/m.          Diet:  Diet Order             Diet Carb Modified Fluid consistency: Thin; Room service appropriate? Yes  Diet effective now                   DVT prophylaxis:   apixaban (ELIQUIS) tablet 5 mg   Antimicrobials: None Fluid: Maintain NS at 75 mill per hour to continue Consultants: None Family Communication: No family at bedside. Called and updated his wife this afternoon.  Status is: Observation  Continue in-hospital care because: Needs IV hydration, continues to feel weak..  Remains tachycardic Level of care: Telemetry   Dispo: The patient is from: Home              Anticipated d/c is to: Pending clinical course              Patient currently is not medically stable to d/c.   Difficult to place patient No    Antimicrobials: Anti-infectives (From admission, onward)    None       Objective: Vitals:   04/24/22 0732 04/24/22 0759  BP: (!) 141/82   Pulse: (!) 112   Resp: 18   Temp: 98.8 F (37.1 C)   SpO2: 97% 97%    Intake/Output Summary (Last 24 hours) at 04/24/2022 1315 Last data filed at 04/24/2022 0600 Gross per 24 hour  Intake 950.87 ml  Output 900 ml  Net 50.87 ml   Filed Weights   04/22/22 1709  Weight: 59 kg   Weight change:  Body mass index is 18.66 kg/m.   Physical Exam: General exam: Pleasant,  elderly African-American male.  Not in physical pain Skin: No rashes, lesions or ulcers. HEENT: Atraumatic, normocephalic, no obvious bleeding Lungs: Clear to auscultation bilaterally CVS: Sinus tachycardia, no murmur GI/Abd soft, mild diffuse tenderness, bowel sound present CNS: Alert, awake, oriented to place and person.  Slow to respond Psychiatry: Sad affect Extremities: No pedal edema, no calf tenderness  Data Review: I have personally reviewed the laboratory data and studies available.  F/u labs ordered Unresulted Labs (From admission, onward)    None       Signed, Terrilee Croak, MD [facility] 04/24/2022

## 2022-04-25 LAB — GLUCOSE, CAPILLARY
Glucose-Capillary: 115 mg/dL — ABNORMAL HIGH (ref 70–99)
Glucose-Capillary: 60 mg/dL — ABNORMAL LOW (ref 70–99)
Glucose-Capillary: 108 mg/dL — ABNORMAL HIGH (ref 70–99)
Glucose-Capillary: 167 mg/dL — ABNORMAL HIGH (ref 70–99)

## 2022-04-25 MED ORDER — SENNOSIDES-DOCUSATE SODIUM 8.6-50 MG PO TABS: 1.0000 | ORAL_TABLET | Freq: Two times a day (BID) | ORAL | 2 refills | Status: DC

## 2022-04-25 MED ORDER — HEPARIN SOD (PORK) LOCK FLUSH 100 UNIT/ML IV SOLN
500.0000 [IU] | INTRAVENOUS | Status: AC | PRN
  Administered 2022-04-25: 500 [IU]

## 2022-04-25 NOTE — Progress Notes (Signed)
Instructions for discharge provided in detail to wife, per pt's requests. Verbalized understanding of all information. Pt transported via WC to car without event.

## 2022-04-25 NOTE — Progress Notes (Signed)
Mobility Specialist - Progress Note   04/25/22 1125  Mobility  Activity Ambulated independently in hallway  Level of Assistance Independent  Assistive Device None  Distance Ambulated (ft) 350 ft  Activity Response Tolerated well  Mobility Referral Yes  $Mobility charge 1 Mobility   Pt received in bed and agreeable to mobility. No complaints during mobility. Pt to bed after session with all needs met.     [facility]

## 2022-04-25 NOTE — Discharge Summary (Signed)
Physician Discharge Summary  [name] NGE:[id number] DOB: 05-04-1956 DOA: 04/22/2022  PCP: [name], MD  Admit date: 04/22/2022 Discharge date: 04/25/2022  Admitted From: Home Discharge disposition: Home  Recommendations at discharge:  Continue Protonix, stool softeners.   Follow-up with oncology as an outpatient.   Brief narrative: Troy [name] is a 66 y.o. male with PMH significant for stage IV hepatocellular carcinoma currently on treatment with atezolizumab and bevacizumab, history of hepatitis C, cirrhosis, varices, portal vein thrombosis on Eliquis, hypertension, hyperlipidemia, insulin-dependent type 2 diabetes, GERD.   Last chemotherapy on 11/22.  He has chronic abdominal pain, chronic constipation and takes morphine and bowel regimen at home.   12/12 per family, patient started having nausea, vomiting, generalized abdominal pain, poor oral intake and generalized weakness and hence presented to cancer center for evaluation. At the cancer center, he was noted to be tachycardic.  Labs showed elevated blood glucose.  He was given IV fluid, IV Phenergan, IV insulin. Sent to ED for further evaluation. In the ED, patient was tachycardic to 120s, afebrile, blood pressure and O2 sat maintained  COVID and influenza PCR negative.   CBG improved to 138.   CT abdomen pelvis did not show any acute intra-abdominal pathology.   Patient was given supportive care with IV fluid, IV pain meds, IV antiemetics . Kept in observation under hospitalist service  Subjective: Patient was seen and examined this morning.  Much more awake, alert and interactive today.  Was walking on the hallway with mobility specialist. He ate aggressively this morning he says.  Feels ready to go home today.  No family at bedside  Hospital course: Acute on chronic abdominal pain Nausea, vomiting Patient has chronic abdominal pain and chronic constipation because of [place]. He is on morphine and bowel regimen.   Labs and imaging did not suggest any new clear etiology of acute flareup of symptoms. He was given supportive care with IV fluid, IV antiemetics, IV morphine. He was also continued on long-term pain regimen with MS Contin 15 mg twice daily, as needed morphine solution  Much better mental status this morning.  Able to ambulate independently.  Okay to discharge home today.  Sinus tachycardia Likely secondary to severe dehydration.  No evidence of arrhythmia on EKG.   Gradually improving. Chronically anticoagulated.  Not hypoxic.  COVID and influenza PCR negative Currently on IV hydration.   echocardiogram with EF 60 to 65%, no WMA, grade 1 diastolic dysfunction, RV function normal.   Hypercalcemia Likely related to underlying malignancy and dehydration.  Calcium 11.1, albumin normal. Improved calcium level with IV fluid. Recent Labs  Lab 04/22/22 1419 04/23/22 0449 04/24/22 0248  CALCIUM 11.1* 9.3 8.9    Mild thrombocytopenia Likely related to underlying malignancy.  Platelet count 119k. No signs of active bleeding. Continue to monitor Recent Labs  Lab 04/22/22 1419 04/23/22 0449 04/24/22 0248  PLT 119* 103* 93*    History of hepatitis C History of liver cirrhosis, portal vein thrombosis, varices GERD Continue Protonix  Stage IV hepatocellular carcinoma No signs of bleeding. Continue Eliquis given portal vein thrombosis. Oncology follow-up with Dr. Burr Medico  Uncontrolled type 2 diabetes mellitus  hyperglycemia/hypoglycemia A1c 9.1 on 10/31/2021 Blood sugar level was initially elevated because patient did not take insulin on the day of presentation because of nausea and vomiting. While in the hospital, his appetite was low and hence he had an episode of hypoglycemia this morning. PTA on Lantus 15 units daily, Jardiance 25 mg daily, metformin 850 mg  twice daily Appetite much improved Resume previous insulin regimen at home. Recent Labs  Lab 04/24/22 2339 04/25/22 0435  04/25/22 0507 04/25/22 0748 04/25/22 1213  GLUCAP 150* 60* 115* 108* 167*   Hypertension Systolic blood pressure currently in the 150s. Continue amlodipine and lisinopril.  Continue to monitor blood pressure   Wounds:  -   Discharge Exam:   Vitals:   04/25/22 0432 04/25/22 0800 04/25/22 0900 04/25/22 1010  BP: 129/80   122/84  Pulse: 97   (!) 103  Resp:  (!) 22 18   Temp: 98.7 F (37.1 C)   98.4 F (36.9 C)  TempSrc: Oral   Oral  SpO2: 100%   100%  Weight:      Height:        Body mass index is 18.66 kg/m.  General exam: Pleasant, elderly African-American male.  Not in physical pain Skin: No rashes, lesions or ulcers. HEENT: Atraumatic, normocephalic, no obvious bleeding Lungs: Clear to auscultation bilaterally CVS: Sinus tachycardia, no murmur GI/Abd soft, mild diffuse tenderness, bowel sound present CNS: Alert, awake, oriented x 3.  Able to walk on the hallway. Psychiatry: Mood appropriate Extremities: No pedal edema, no calf tenderness  Follow ups:    Follow-up Information     [name], MD Follow up.   Specialties: Hematology, Oncology Contact information: [place] [address] [phone]                 Discharge Instructions:   Discharge Instructions     Call MD for:  difficulty breathing, headache or visual disturbances   Complete by: As directed    Call MD for:  extreme fatigue   Complete by: As directed    Call MD for:  hives   Complete by: As directed    Call MD for:  persistant dizziness or light-headedness   Complete by: As directed    Call MD for:  persistant nausea and vomiting   Complete by: As directed    Call MD for:  severe uncontrolled pain   Complete by: As directed    Call MD for:  temperature >100.4   Complete by: As directed    Diet general   Complete by: As directed    Discharge instructions   Complete by: As directed    Recommendations at discharge:   Continue Protonix, stool  softeners.    Follow-up with oncology as an outpatient.  General discharge instructions: Follow with Primary MD [name], MD in 7 days  Please request your PCP  to go over your hospital tests, procedures, radiology results at the follow up. Please get your medicines reviewed and adjusted.  Your PCP may decide to repeat certain labs or tests as needed. Do not drive, operate heavy machinery, perform activities at heights, swimming or participation in water activities or provide baby sitting services if your were admitted for syncope or siezures until you have seen by Primary MD or a Neurologist and advised to do so again. [place] Controlled Substance Reporting System database was reviewed. Do not drive, operate heavy machinery, perform activities at heights, swim, participate in water activities or provide baby-sitting services while on medications for pain, sleep and mood until your outpatient physician has reevaluated you and advised to do so again.  You are strongly recommended to comply with the dose, frequency and duration of prescribed medications. Activity: As tolerated with Full fall precautions use walker/cane & assistance as needed Avoid using any recreational substances like cigarette,  tobacco, alcohol, or non-prescribed drug. If you experience worsening of your admission symptoms, develop shortness of breath, life threatening emergency, suicidal or homicidal thoughts you must seek medical attention immediately by calling 911 or calling your MD immediately  if symptoms less severe. You must read complete instructions/literature along with all the possible adverse reactions/side effects for all the medicines you take and that have been prescribed to you. Take any new medicine only after you have completely understood and accepted all the possible adverse reactions/side effects.  Wear Seat belts while driving. You were cared for by a hospitalist during your hospital stay. If you have any  questions about your discharge medications or the care you received while you were in the hospital after you are discharged, you can call the unit and ask to speak with the hospitalist or the covering physician. Once you are discharged, your primary care physician will handle any further medical issues. Please note that NO REFILLS for any discharge medications will be authorized once you are discharged, as it is imperative that you return to your primary care physician (or establish a relationship with a primary care physician if you do not have one).   Increase activity slowly   Complete by: As directed        Discharge Medications:   Allergies as of 04/25/2022   No Known Allergies      Medication List     STOP taking these medications    Dexlansoprazole 30 MG capsule DR   lidocaine-prilocaine cream Commonly known as: EMLA   mirtazapine 7.5 MG tablet Commonly known as: REMERON   nystatin 100000 UNIT/ML suspension Commonly known as: MYCOSTATIN   oxyCODONE 5 MG immediate release tablet Commonly known as: Roxicodone       TAKE these medications    amLODipine 10 MG tablet Commonly known as: NORVASC Take 10 mg by mouth daily.   apixaban 5 MG Tabs tablet Commonly known as: ELIQUIS Take 1 tablet (5 mg total) by mouth 2 (two) times daily.   carboxymethylcellulose 0.5 % Soln Commonly known as: REFRESH PLUS Place 1 drop into both eyes 4 (four) times daily as needed (dry eyes).   docusate sodium 100 MG capsule Commonly known as: Colace Take 1 capsule (100 mg total) by mouth 2 (two) times daily.   empagliflozin 25 MG Tabs tablet Commonly known as: JARDIANCE Take 25 mg by mouth daily.   ferrous gluconate 324 MG tablet Commonly known as: FERGON Take 324 mg by mouth daily.   hydrocortisone 1 % ointment Apply 1 Application topically 2 (two) times daily as needed for itching.   insulin glargine 100 UNIT/ML injection Commonly known as: LANTUS Inject 0.15 mLs (15  Units total) into the skin daily.   lisinopril 40 MG tablet Commonly known as: ZESTRIL Take 40 mg by mouth daily.   metFORMIN 850 MG tablet Commonly known as: GLUCOPHAGE Take 850 mg by mouth 2 (two) times daily.   morphine 15 MG 12 hr tablet Commonly known as: MS CONTIN Take 1 tablet (15 mg total) by mouth every 12 (twelve) hours.   morphine 20 MG/5ML solution Take 0.6-1.3 mLs (2.4-5.2 mg total) by mouth every 4 (four) hours as needed for pain.   ondansetron 8 MG tablet Commonly known as: ZOFRAN Take 1 tablet (8 mg total) by mouth every 8 (eight) hours as needed for nausea or vomiting. What changed: Another medication with the same name was removed. Continue taking this medication, and follow the directions you see here.  pantoprazole 40 MG tablet Commonly known as: PROTONIX Take 40 mg by mouth 2 (two) times daily.   polyethylene glycol powder 17 GM/SCOOP powder Commonly known as: MiraLax Take 255 g by mouth daily.   promethazine 12.5 MG tablet Commonly known as: PHENERGAN Take 2 tablets (25 mg total) by mouth every 6 (six) hours as needed for refractory nausea / vomiting.   senna-docusate 8.6-50 MG tablet Commonly known as: Senokot-S Take 1 tablet by mouth 2 (two) times daily.               Durable Medical Equipment  (From admission, onward)           Start     Ordered   04/24/22 0942  For home use only DME Walker rolling  Once       Question Answer Comment  Walker: With 5 Inch Wheels   Patient needs a walker to treat with the following condition General weakness      04/24/22 0941             The results of significant diagnostics from this hospitalization (including imaging, microbiology, ancillary and laboratory) are listed below for reference.    Procedures and Diagnostic Studies:   CT ABDOMEN PELVIS WO CONTRAST  Result Date: 04/22/2022 CLINICAL DATA:  Hepatocellular carcinoma, portal vein thrombosis, acute nonlocalized abdominal pain.  Tachycardia, vomiting, lower abdominal pain, nausea. EXAM: CT ABDOMEN AND PELVIS WITHOUT CONTRAST TECHNIQUE: Multidetector CT imaging of the abdomen and pelvis was performed following the standard protocol without IV contrast. RADIATION DOSE REDUCTION: This exam was performed according to the departmental dose-optimization program which includes automated exposure control, adjustment of the mA and/or kV according to patient size and/or use of iterative reconstruction technique. COMPARISON:  04/18/2022, 10/31/2021 FINDINGS: Lower chest: No acute abnormality. Shrapnel within the left hemithorax again noted. Central venous catheter tip noted within the right atrium. Soft tissue at the diaphragmatic hiatus in keeping with multiple large gastroesophageal varices, better appreciated on prior contrast enhanced examination. Hepatobiliary: The dominant left hepatic mass is not well visualized on this examination given noncontrast technique. Marked left-sided volume loss, however, in keeping with response to therapy again noted when compared to remote prior examination of 10/31/2021. The main portal vein is expanded in keeping with known portal vein thrombus noted on prior examination. Right hepatic lobe is unremarkable on this noncontrast examination. Gallbladder unremarkable. Pancreas: Unremarkable. No pancreatic ductal dilatation or surrounding inflammatory changes. Spleen: Normal in size without focal abnormality. Adrenals/Urinary Tract: The adrenal glands are unremarkable. The kidneys are unremarkable in this noncontrast examination. Bladder unremarkable. Stomach/Bowel: Stomach is within normal limits. Appendix appears normal. No evidence of bowel wall thickening, distention, or inflammatory changes. No free intraperitoneal gas or fluid. Vascular/Lymphatic: Gastroesophageal varices, retroperitoneal varices, and recanalization of the umbilical vein again noted in keeping with changes of portal venous hypertension.  Moderate aortoiliac atherosclerotic calcification. No aortic aneurysm. No pathologic adenopathy is clearly identified on this noncontrast examination. Reproductive: Prostate is unremarkable. Other: No abdominal wall hernia Musculoskeletal: No acute or significant osseous findings. IMPRESSION: 1. No acute intra-abdominal pathology identified. No definite radiographic explanation for the patient's reported symptoms. 2. The dominant left hepatic mass is not well visualized on this examination given noncontrast technique. Marked left-sided volume loss, however, in keeping with response to therapy again noted when compared to remote prior examination of 10/31/2021. 3. Cirrhosis. Multiple large gastroesophageal varices, retroperitoneal varices, and recanalization of the umbilical vein in keeping with changes of portal venous hypertension. Expansion of the  main portal vein in keeping with portal vein thrombosis again noted. 4.  Aortic Atherosclerosis (ICD10-I70.0). Electronically Signed   By: Fidela Salisbury M.D.   On: 04/22/2022 19:10     Labs:   Basic Metabolic Panel: Recent Labs  Lab 04/22/22 1419 04/23/22 0449 04/24/22 0248  NA 144 144 141  K 4.3 3.9 3.5  CL 106 113* 112*  CO2 23 21* 20*  GLUCOSE 347* 183* 107*  BUN '21 23 19  '$ CREATININE 1.20 1.05 0.94  CALCIUM 11.1* 9.3 8.9   GFR Estimated Creatinine Clearance: 62.8 mL/min (by C-G formula based on SCr of 0.94 mg/dL). Liver Function Tests: Recent Labs  Lab 04/22/22 1419 04/23/22 0449  AST 102* 84*  ALT 55* 46*  ALKPHOS 226* 158*  BILITOT 0.9 1.2  PROT 8.3* 7.2  ALBUMIN 4.0 3.0*   No results for input(s): "LIPASE", "AMYLASE" in the last 168 hours. Recent Labs  Lab 04/22/22 2050  AMMONIA 32   Coagulation profile No results for input(s): "INR", "PROTIME" in the last 168 hours.  CBC: Recent Labs  Lab 04/22/22 1419 04/23/22 0449 04/24/22 0248  WBC 6.6 7.4 5.9  NEUTROABS 5.8  --  4.5  HGB 13.4 11.6* 11.8*  HCT 39.9 36.5*  36.4*  MCV 88.9 91.7 90.8  PLT 119* 103* 93*   Cardiac Enzymes: No results for input(s): "CKTOTAL", "CKMB", "CKMBINDEX", "TROPONINI" in the last 168 hours. BNP: Invalid input(s): "POCBNP" CBG: Recent Labs  Lab 04/24/22 2339 04/25/22 0435 04/25/22 0507 04/25/22 0748 04/25/22 1213  GLUCAP 150* 60* 115* 108* 167*   D-Dimer No results for input(s): "DDIMER" in the last 72 hours. Hgb A1c Recent Labs    04/23/22 0449  HGBA1C 6.9*   Lipid Profile No results for input(s): "CHOL", "HDL", "LDLCALC", "TRIG", "CHOLHDL", "LDLDIRECT" in the last 72 hours. Thyroid function studies No results for input(s): "TSH", "T4TOTAL", "T3FREE", "THYROIDAB" in the last 72 hours.  Invalid input(s): "FREET3" Anemia work up No results for input(s): "VITAMINB12", "FOLATE", "FERRITIN", "TIBC", "IRON", "RETICCTPCT" in the last 72 hours. Microbiology Recent Results (from the past 240 hour(s))  Resp panel by RT-PCR (RSV, Flu A&B, Covid) Anterior Nasal Swab     Status: None   Collection Time: 04/22/22  7:26 PM   Specimen: Anterior Nasal Swab  Result Value Ref Range Status   SARS Coronavirus 2 by RT PCR NEGATIVE NEGATIVE Final    Comment: (NOTE) SARS-CoV-2 target nucleic acids are NOT DETECTED.  The SARS-CoV-2 RNA is generally detectable in upper respiratory specimens during the acute phase of infection. The lowest concentration of SARS-CoV-2 viral copies this assay can detect is 138 copies/mL. A negative result does not preclude SARS-Cov-2 infection and should not be used as the sole basis for treatment or other patient management decisions. A negative result may occur with  improper specimen collection/handling, submission of specimen other than nasopharyngeal swab, presence of viral mutation(s) within the areas targeted by this assay, and inadequate number of viral copies(<138 copies/mL). A negative result must be combined with clinical observations, patient history, and  epidemiological information. The expected result is Negative.  Fact Sheet for Patients:  EntrepreneurPulse.com.au  Fact Sheet for Healthcare Providers:  IncredibleEmployment.be  This test is no t yet approved or cleared by the [place] FDA and  has been authorized for detection and/or diagnosis of SARS-CoV-2 by FDA under an Emergency Use Authorization (EUA). This EUA will remain  in effect (meaning this test can be used) for the duration of the COVID-19 declaration under Section 564(b)(1)  of the Act, 21 U.S.C.section 360bbb-3(b)(1), unless the authorization is terminated  or revoked sooner.       Influenza A by PCR NEGATIVE NEGATIVE Final   Influenza B by PCR NEGATIVE NEGATIVE Final    Comment: (NOTE) The Xpert Xpress SARS-CoV-2/FLU/RSV plus assay is intended as an aid in the diagnosis of influenza from Nasopharyngeal swab specimens and should not be used as a sole basis for treatment. Nasal washings and aspirates are unacceptable for Xpert Xpress SARS-CoV-2/FLU/RSV testing.  Fact Sheet for Patients: EntrepreneurPulse.com.au  Fact Sheet for Healthcare Providers: IncredibleEmployment.be  This test is not yet approved or cleared by the [place] FDA and has been authorized for detection and/or diagnosis of SARS-CoV-2 by FDA under an Emergency Use Authorization (EUA). This EUA will remain in effect (meaning this test can be used) for the duration of the COVID-19 declaration under Section 564(b)(1) of the Act, 21 U.S.C. section 360bbb-3(b)(1), unless the authorization is terminated or revoked.     Resp Syncytial Virus by PCR NEGATIVE NEGATIVE Final    Comment: (NOTE) Fact Sheet for Patients: EntrepreneurPulse.com.au  Fact Sheet for Healthcare Providers: IncredibleEmployment.be  This test is not yet approved or cleared by the [place] FDA and has been  authorized for detection and/or diagnosis of SARS-CoV-2 by FDA under an Emergency Use Authorization (EUA). This EUA will remain in effect (meaning this test can be used) for the duration of the COVID-19 declaration under Section 564(b)(1) of the Act, 21 U.S.C. section 360bbb-3(b)(1), unless the authorization is terminated or revoked.  Performed at [facility], [place] [address], [place], [place] [address]     Time coordinating discharge: 35 minutes  Signed: Marlowe Aschoff Icarus Partch  [facility] 04/25/2022, 12:57 PM

## 2022-04-29 ENCOUNTER — Other Ambulatory Visit: Payer: No Typology Code available for payment source

## 2022-04-29 ENCOUNTER — Other Ambulatory Visit: Payer: Self-pay

## 2022-04-29 ENCOUNTER — Telehealth: Payer: Self-pay

## 2022-04-29 DIAGNOSIS — C220 Liver cell carcinoma: Secondary | ICD-10-CM

## 2022-04-29 NOTE — Telephone Encounter (Signed)
Pt's spouse called stating that she noticed that the pt has been getting up a lot to go urinate.  Pt denied burning & bleeding during urination, and back pain.  Pt is having 5/10 pain in his abdomen.  Per pt's wife, pt has abdominal swelling and looks bloated.  Pt denied constipation but stated he's having loose stools.  Pt just recent was d/c from [facility].  Pt has some SOB but nothing out of the ordinary for this pt.  Spoke with Dr. Burr Medico regarding pt's symptoms and recommends the pt being seen in [facility] for further evaluation.  Informed pt's wife that this RN will check with [facility] to see if the pt can be seen today or tomorrow in [facility].

## 2022-04-30 ENCOUNTER — Inpatient Hospital Stay (HOSPITAL_BASED_OUTPATIENT_CLINIC_OR_DEPARTMENT_OTHER): Payer: No Typology Code available for payment source | Admitting: Physician Assistant

## 2022-04-30 ENCOUNTER — Telehealth: Payer: Self-pay

## 2022-04-30 ENCOUNTER — Other Ambulatory Visit: Payer: Self-pay

## 2022-04-30 VITALS — BP 121/71 | HR 97 | Temp 98.7°F | Resp 18 | Wt 141.6 lb

## 2022-04-30 DIAGNOSIS — R109 Unspecified abdominal pain: Secondary | ICD-10-CM | POA: Diagnosis not present

## 2022-04-30 DIAGNOSIS — E1165 Type 2 diabetes mellitus with hyperglycemia: Secondary | ICD-10-CM | POA: Diagnosis not present

## 2022-04-30 DIAGNOSIS — K766 Portal hypertension: Secondary | ICD-10-CM | POA: Diagnosis not present

## 2022-04-30 DIAGNOSIS — K7469 Other cirrhosis of liver: Secondary | ICD-10-CM | POA: Diagnosis not present

## 2022-04-30 DIAGNOSIS — C787 Secondary malignant neoplasm of liver and intrahepatic bile duct: Secondary | ICD-10-CM | POA: Diagnosis not present

## 2022-04-30 DIAGNOSIS — Z515 Encounter for palliative care: Secondary | ICD-10-CM

## 2022-04-30 DIAGNOSIS — E860 Dehydration: Secondary | ICD-10-CM | POA: Diagnosis not present

## 2022-04-30 DIAGNOSIS — C220 Liver cell carcinoma: Secondary | ICD-10-CM | POA: Diagnosis present

## 2022-04-30 DIAGNOSIS — Z800 Family history of malignant neoplasm of digestive organs: Secondary | ICD-10-CM | POA: Diagnosis not present

## 2022-04-30 DIAGNOSIS — Z8249 Family history of ischemic heart disease and other diseases of the circulatory system: Secondary | ICD-10-CM | POA: Diagnosis not present

## 2022-04-30 DIAGNOSIS — Z79899 Other long term (current) drug therapy: Secondary | ICD-10-CM | POA: Diagnosis not present

## 2022-04-30 DIAGNOSIS — G893 Neoplasm related pain (acute) (chronic): Secondary | ICD-10-CM

## 2022-04-30 DIAGNOSIS — R000 Tachycardia, unspecified: Secondary | ICD-10-CM | POA: Diagnosis not present

## 2022-04-30 DIAGNOSIS — I700 Atherosclerosis of aorta: Secondary | ICD-10-CM | POA: Diagnosis not present

## 2022-04-30 DIAGNOSIS — Z7901 Long term (current) use of anticoagulants: Secondary | ICD-10-CM | POA: Diagnosis not present

## 2022-04-30 DIAGNOSIS — K5900 Constipation, unspecified: Secondary | ICD-10-CM | POA: Diagnosis not present

## 2022-04-30 DIAGNOSIS — K828 Other specified diseases of gallbladder: Secondary | ICD-10-CM | POA: Diagnosis not present

## 2022-04-30 DIAGNOSIS — I864 Gastric varices: Secondary | ICD-10-CM | POA: Diagnosis not present

## 2022-04-30 DIAGNOSIS — I81 Portal vein thrombosis: Secondary | ICD-10-CM | POA: Diagnosis not present

## 2022-04-30 MED ORDER — MORPHINE SULFATE ER 15 MG PO TBCR: 15.0000 mg | EXTENDED_RELEASE_TABLET | Freq: Two times a day (BID) | ORAL | 0 refills | Status: DC

## 2022-04-30 MED ORDER — MORPHINE SULFATE 20 MG/5ML PO SOLN: 2.5000 mg | ORAL | 0 refills | Status: DC | PRN
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
## 2022-04-30 NOTE — Telephone Encounter (Signed)
Pt wife called for a refill of pain medication, orders sent. No further needs at this time.

## 2022-05-07 ENCOUNTER — Other Ambulatory Visit: Payer: Self-pay | Admitting: Interventional Radiology

## 2022-05-07 ENCOUNTER — Encounter: Payer: Self-pay | Admitting: Interventional Radiology

## 2022-05-07 DIAGNOSIS — C220 Liver cell carcinoma: Secondary | ICD-10-CM
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
## 2022-05-13 NOTE — Assessment & Plan Note (Signed)
-  likely related to his liver cancer and liver cirrhosis  -continue antiemetics

## 2022-05-13 NOTE — Assessment & Plan Note (Addendum)
cT3N1M0, stage IVA, G3 -history of cirrhosis since ~2011 and hepatitis C diagnosed and treated in 2018 -diagnosed by liver biopsy on 11/04/21, baseline AFP normal  -s/p palliative radiation under Dr. Lisbeth Renshaw, 7/20-12/17/21 -he began Tecentriq and bevacizumab on 12/06/21. Tolerating well overall. 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 Maryelizabeth Kaufmann. -treatment held in Dec 2023 due to hospital admission for N/V and poor oral intake, improved now

## 2022-05-13 NOTE — Assessment & Plan Note (Signed)
--  history of cirrhosis since ~2011 and hepatitis C diagnosed and treated in 2018 per pt  -f/u with GI

## 2022-05-14 ENCOUNTER — Inpatient Hospital Stay: Payer: No Typology Code available for payment source

## 2022-05-14 ENCOUNTER — Encounter: Payer: Self-pay | Admitting: Nurse Practitioner

## 2022-05-14 ENCOUNTER — Inpatient Hospital Stay: Payer: No Typology Code available for payment source | Attending: Hematology

## 2022-05-14 ENCOUNTER — Inpatient Hospital Stay: Payer: No Typology Code available for payment source | Admitting: Dietician

## 2022-05-14 ENCOUNTER — Encounter: Payer: Self-pay | Admitting: Hematology

## 2022-05-14 ENCOUNTER — Inpatient Hospital Stay (HOSPITAL_BASED_OUTPATIENT_CLINIC_OR_DEPARTMENT_OTHER): Payer: No Typology Code available for payment source | Admitting: Hematology

## 2022-05-14 ENCOUNTER — Other Ambulatory Visit: Payer: Self-pay

## 2022-05-14 ENCOUNTER — Inpatient Hospital Stay (HOSPITAL_BASED_OUTPATIENT_CLINIC_OR_DEPARTMENT_OTHER): Payer: No Typology Code available for payment source | Admitting: Nurse Practitioner

## 2022-05-14 VITALS — BP 120/76 | HR 83 | Temp 98.2°F | Ht 71.0 in | Wt 136.1 lb

## 2022-05-14 VITALS — BP 122/78 | HR 72 | Temp 98.2°F | Resp 18

## 2022-05-14 DIAGNOSIS — Z79899 Other long term (current) drug therapy: Secondary | ICD-10-CM | POA: Insufficient documentation

## 2022-05-14 DIAGNOSIS — K5900 Constipation, unspecified: Secondary | ICD-10-CM

## 2022-05-14 DIAGNOSIS — R634 Abnormal weight loss: Secondary | ICD-10-CM

## 2022-05-14 DIAGNOSIS — C220 Liver cell carcinoma: Secondary | ICD-10-CM

## 2022-05-14 DIAGNOSIS — K766 Portal hypertension: Secondary | ICD-10-CM | POA: Diagnosis not present

## 2022-05-14 DIAGNOSIS — I700 Atherosclerosis of aorta: Secondary | ICD-10-CM | POA: Diagnosis not present

## 2022-05-14 DIAGNOSIS — R110 Nausea: Secondary | ICD-10-CM | POA: Diagnosis not present

## 2022-05-14 DIAGNOSIS — K7469 Other cirrhosis of liver: Secondary | ICD-10-CM | POA: Insufficient documentation

## 2022-05-14 DIAGNOSIS — Z5112 Encounter for antineoplastic immunotherapy: Secondary | ICD-10-CM | POA: Insufficient documentation

## 2022-05-14 DIAGNOSIS — Z7901 Long term (current) use of anticoagulants: Secondary | ICD-10-CM | POA: Insufficient documentation

## 2022-05-14 DIAGNOSIS — Z515 Encounter for palliative care: Secondary | ICD-10-CM

## 2022-05-14 DIAGNOSIS — E119 Type 2 diabetes mellitus without complications: Secondary | ICD-10-CM | POA: Insufficient documentation

## 2022-05-14 DIAGNOSIS — I868 Varicose veins of other specified sites: Secondary | ICD-10-CM | POA: Insufficient documentation

## 2022-05-14 DIAGNOSIS — E860 Dehydration: Secondary | ICD-10-CM

## 2022-05-14 DIAGNOSIS — R112 Nausea with vomiting, unspecified: Secondary | ICD-10-CM | POA: Diagnosis not present

## 2022-05-14 DIAGNOSIS — I864 Gastric varices: Secondary | ICD-10-CM | POA: Insufficient documentation

## 2022-05-14 DIAGNOSIS — K828 Other specified diseases of gallbladder: Secondary | ICD-10-CM | POA: Diagnosis not present

## 2022-05-14 DIAGNOSIS — B182 Chronic viral hepatitis C: Secondary | ICD-10-CM

## 2022-05-14 DIAGNOSIS — E440 Moderate protein-calorie malnutrition: Secondary | ICD-10-CM | POA: Diagnosis not present

## 2022-05-14 DIAGNOSIS — G893 Neoplasm related pain (acute) (chronic): Secondary | ICD-10-CM

## 2022-05-14 DIAGNOSIS — K7460 Unspecified cirrhosis of liver: Secondary | ICD-10-CM

## 2022-05-14 LAB — CMP (CANCER CENTER ONLY)
ALT: 36 U/L (ref 0–44)
AST: 82 U/L — ABNORMAL HIGH (ref 15–41)
Albumin: 3.5 g/dL (ref 3.5–5.0)
Alkaline Phosphatase: 231 U/L — ABNORMAL HIGH (ref 38–126)
Anion gap: 7 (ref 5–15)
BUN: 17 mg/dL (ref 8–23)
CO2: 28 mmol/L (ref 22–32)
Calcium: 10.2 mg/dL (ref 8.9–10.3)
Chloride: 107 mmol/L (ref 98–111)
Creatinine: 1.14 mg/dL (ref 0.61–1.24)
GFR, Estimated: >60 mL/min (ref >60)
Glucose, Bld: 112 mg/dL — ABNORMAL HIGH (ref 70–99)
Potassium: 4 mmol/L (ref 3.5–5.1)
Sodium: 142 mmol/L (ref 135–145)
Total Bilirubin: 0.8 mg/dL (ref 0.3–1.2)
Total Protein: 7.4 g/dL (ref 6.5–8.1)

## 2022-05-14 LAB — CBC WITH DIFFERENTIAL (CANCER CENTER ONLY)
Abs Immature Granulocytes: 0.02 10*3/uL (ref 0.00–0.07)
Basophils Absolute: 0 10*3/uL (ref 0.0–0.1)
Basophils Relative: 1 %
Eosinophils Absolute: 0.2 10*3/uL (ref 0.0–0.5)
Eosinophils Relative: 4 %
HCT: 34.9 % — ABNORMAL LOW (ref 39.0–52.0)
Hemoglobin: 11.5 g/dL — ABNORMAL LOW (ref 13.0–17.0)
Immature Granulocytes: 0 %
Lymphocytes Relative: 22 %
Lymphs Abs: 1 10*3/uL (ref 0.7–4.0)
MCH: 30.1 pg (ref 26.0–34.0)
MCHC: 33 g/dL (ref 30.0–36.0)
MCV: 91.4 fL (ref 80.0–100.0)
Monocytes Absolute: 0.6 10*3/uL (ref 0.1–1.0)
Monocytes Relative: 13 %
Neutro Abs: 2.7 10*3/uL (ref 1.7–7.7)
Neutrophils Relative %: 60 %
Platelet Count: 169 10*3/uL (ref 150–400)
RBC: 3.82 MIL/uL — ABNORMAL LOW (ref 4.22–5.81)
RDW: 15.8 % — ABNORMAL HIGH (ref 11.5–15.5)
WBC Count: 4.5 10*3/uL (ref 4.0–10.5)
nRBC: 0 % (ref 0.0–0.2)

## 2022-05-14 LAB — TSH: TSH: 2.708 u[IU]/mL (ref 0.350–4.500)

## 2022-05-14 MED ORDER — PROMETHAZINE HCL 12.5 MG PO TABS: 25.0000 mg | ORAL_TABLET | Freq: Four times a day (QID) | ORAL | 2 refills | Status: DC | PRN

## 2022-05-14 MED ORDER — SODIUM CHLORIDE 0.9% FLUSH
10.0000 mL | Freq: Once | INTRAVENOUS | Status: AC | PRN
  Administered 2022-05-14: 10 mL

## 2022-05-14 MED ORDER — SODIUM CHLORIDE 0.9 % IV SOLN
1200.0000 mg | Freq: Once | INTRAVENOUS | Status: AC
  Administered 2022-05-14: 1200 mg via INTRAVENOUS
  Filled 2022-05-14: qty 20

## 2022-05-14 MED ORDER — ONDANSETRON HCL 4 MG/2ML IJ SOLN
8.0000 mg | Freq: Once | INTRAMUSCULAR | Status: AC
  Administered 2022-05-14: 8 mg via INTRAVENOUS

## 2022-05-14 MED ORDER — SODIUM CHLORIDE 0.9 % IV SOLN: 8.0000 mg | Freq: Once | INTRAVENOUS | Status: DC

## 2022-05-14 MED ORDER — SODIUM CHLORIDE 0.9 % IV SOLN
15.0000 mg/kg | Freq: Once | INTRAVENOUS | Status: AC
  Administered 2022-05-14: 900 mg via INTRAVENOUS
  Filled 2022-05-14: qty 32

## 2022-05-14 MED ORDER — SODIUM CHLORIDE 0.9 % IV SOLN: Freq: Once | INTRAVENOUS | Status: AC

## 2022-05-14 NOTE — Progress Notes (Signed)
Nutrition Follow-up:  Patient with [facility]. He is receiving Tecentriq + Bevacizumab q21d.   Noted December 2023 therapy held due to hospital admission for N/V.  Met with patient during infusion. He reports episode of nausea with vomiting prior to visit. Patient was given IV zofran and feeling better this afternoon. Patient reports appetite has improved and eating well. Foods are tasting good to him again. Patient recalls 3 meals plus Ensure twice daily. He denies constipation or diarrhea.   Medications: reviewed   Labs: reviewed   Anthropometrics: Wt 136 lb 1.6 oz today increased   12/12 - 130 lb 1.1 oz  11/22 - 129 lb 6.4 oz  9/29 - 127 lb 9.6 oz    NUTRITION DIAGNOSIS: Unintended weight loss improving    INTERVENTION:  Continue drinking 2 Ensure Plus/equivalent for weight maintenance    MONITORING, EVALUATION, GOAL: weight trends, intake   NEXT VISIT: To be scheduled as needed

## 2022-05-14 NOTE — Progress Notes (Signed)
Ok to treat today per Dr. Burr Medico with AST of 82 and no urine sample for his Avastin today.

## 2022-05-14 NOTE — Patient Instructions (Signed)
[facility]  Discharge Instructions: Thank you for choosing [facility] to provide your oncology and hematology care.   If you have a lab appointment with the [facility], please go directly to the [facility] and check in at the registration area.   Wear comfortable clothing and clothing appropriate for easy access to any Portacath or PICC line.   We strive to give you quality time with your provider. You may need to reschedule your appointment if you arrive late (15 or more minutes).  Arriving late affects you and other patients whose appointments are after yours.  Also, if you miss three or more appointments without notifying the office, you may be dismissed from the clinic at the provider's discretion.      For prescription refill requests, have your pharmacy contact our office and allow 72 hours for refills to be completed.    Today you received the following chemotherapy and/or immunotherapy agents tecentriq, Avastin    To help prevent nausea and vomiting after your treatment, we encourage you to take your nausea medication as directed.  BELOW ARE SYMPTOMS THAT SHOULD BE REPORTED IMMEDIATELY: *FEVER GREATER THAN 100.4 F (38 C) OR HIGHER *CHILLS OR SWEATING *NAUSEA AND VOMITING THAT IS NOT CONTROLLED WITH YOUR NAUSEA MEDICATION *UNUSUAL SHORTNESS OF BREATH *UNUSUAL BRUISING OR BLEEDING *URINARY PROBLEMS (pain or burning when urinating, or frequent urination) *BOWEL PROBLEMS (unusual diarrhea, constipation, pain near the anus) TENDERNESS IN MOUTH AND THROAT WITH OR WITHOUT PRESENCE OF ULCERS (sore throat, sores in mouth, or a toothache) UNUSUAL RASH, SWELLING OR PAIN  UNUSUAL VAGINAL DISCHARGE OR ITCHING   Items with * indicate a potential emergency and should be followed up as soon as possible or go to the Emergency Department if any problems should occur.  Please show the CHEMOTHERAPY ALERT CARD or IMMUNOTHERAPY ALERT CARD at  check-in to the Emergency Department and triage nurse.  Should you have questions after your visit or need to cancel or reschedule your appointment, please contact [facility]  Dept: [phone]  and follow the prompts.  Office hours are 8:00 a.m. to 4:30 p.m. Monday - Friday. Please note that voicemails left after 4:00 p.m. may not be returned until the following business day.  We are closed weekends and major holidays. You have access to a nurse at all times for urgent questions. Please call the main number to the clinic Dept: [phone] and follow the prompts.   For any non-urgent questions, you may also contact your provider using MyChart. We now offer e-Visits for anyone 22 and older to request care online for non-urgent symptoms. For details visit [email or web address].   Also download the MyChart app! Go to the app store, search "MyChart", open the app, select [facility], and log in with your MyChart username and password.
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
## 2022-05-15 ENCOUNTER — Telehealth: Payer: Self-pay | Admitting: Hematology

## 2022-05-15 LAB — T4: T4, Total: 6.7 ug/dL (ref 4.5–12.0)

## 2022-05-15 NOTE — Telephone Encounter (Signed)
Spoke with patient wife confirming all upcoming appointments

## 2022-06-02 NOTE — Assessment & Plan Note (Signed)
cT3N1M0, stage IVA, G3 -history of cirrhosis since ~2011 and hepatitis C diagnosed and treated in 2018 -diagnosed by liver biopsy on 11/04/21, baseline AFP normal  -s/p palliative radiation under Dr. Lisbeth Renshaw, 7/20-12/17/21 -he began Tecentriq and bevacizumab on 12/06/21. Tolerating well overall. 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 Maryelizabeth Kaufmann. -Restaging CT scan from April 21, 2022 showed excellent partial response to treatment, reviewed with patient and his wife today. -treatment held in Dec 2023 due to hospital admission for N/V and poor oral intake, improved now
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
## 2022-06-02 NOTE — Assessment & Plan Note (Signed)
--  history of cirrhosis since ~2011 and hepatitis C diagnosed and treated in 2018 per pt  -f/u with GI

## 2022-06-02 NOTE — Assessment & Plan Note (Signed)
-  likely related to his liver cancer and liver cirrhosis  -continue antiemetics  -improved lately

## 2022-06-03 ENCOUNTER — Inpatient Hospital Stay: Payer: No Typology Code available for payment source

## 2022-06-03 ENCOUNTER — Encounter: Payer: Self-pay | Admitting: Hematology

## 2022-06-03 ENCOUNTER — Inpatient Hospital Stay (HOSPITAL_BASED_OUTPATIENT_CLINIC_OR_DEPARTMENT_OTHER): Payer: No Typology Code available for payment source | Admitting: Hematology

## 2022-06-03 ENCOUNTER — Other Ambulatory Visit: Payer: Self-pay

## 2022-06-03 VITALS — BP 130/78 | HR 106 | Temp 98.2°F | Resp 16 | Ht 71.0 in | Wt 131.1 lb

## 2022-06-03 VITALS — BP 134/76 | HR 90 | Temp 98.4°F | Resp 18

## 2022-06-03 DIAGNOSIS — R112 Nausea with vomiting, unspecified: Secondary | ICD-10-CM

## 2022-06-03 DIAGNOSIS — Z5112 Encounter for antineoplastic immunotherapy: Secondary | ICD-10-CM | POA: Diagnosis not present

## 2022-06-03 DIAGNOSIS — C220 Liver cell carcinoma: Secondary | ICD-10-CM | POA: Diagnosis not present

## 2022-06-03 DIAGNOSIS — K7460 Unspecified cirrhosis of liver: Secondary | ICD-10-CM | POA: Diagnosis not present

## 2022-06-03 DIAGNOSIS — B182 Chronic viral hepatitis C: Secondary | ICD-10-CM | POA: Diagnosis not present

## 2022-06-03 DIAGNOSIS — E860 Dehydration: Secondary | ICD-10-CM

## 2022-06-03 LAB — CBC WITH DIFFERENTIAL (CANCER CENTER ONLY)
Abs Immature Granulocytes: 0.01 10*3/uL (ref 0.00–0.07)
Basophils Absolute: 0 10*3/uL (ref 0.0–0.1)
Basophils Relative: 1 %
Eosinophils Absolute: 0.1 10*3/uL (ref 0.0–0.5)
Eosinophils Relative: 3 %
HCT: 36 % — ABNORMAL LOW (ref 39.0–52.0)
Hemoglobin: 12.3 g/dL — ABNORMAL LOW (ref 13.0–17.0)
Immature Granulocytes: 0 %
Lymphocytes Relative: 14 %
Lymphs Abs: 0.6 10*3/uL — ABNORMAL LOW (ref 0.7–4.0)
MCH: 30.5 pg (ref 26.0–34.0)
MCHC: 34.2 g/dL (ref 30.0–36.0)
MCV: 89.3 fL (ref 80.0–100.0)
Monocytes Absolute: 0.5 10*3/uL (ref 0.1–1.0)
Monocytes Relative: 12 %
Neutro Abs: 2.9 10*3/uL (ref 1.7–7.7)
Neutrophils Relative %: 70 %
Platelet Count: 129 10*3/uL — ABNORMAL LOW (ref 150–400)
RBC: 4.03 MIL/uL — ABNORMAL LOW (ref 4.22–5.81)
RDW: 14.8 % (ref 11.5–15.5)
WBC Count: 4.1 10*3/uL (ref 4.0–10.5)
nRBC: 0 % (ref 0.0–0.2)

## 2022-06-03 LAB — CMP (CANCER CENTER ONLY)
ALT: 30 U/L (ref 0–44)
AST: 79 U/L — ABNORMAL HIGH (ref 15–41)
Albumin: 3.6 g/dL (ref 3.5–5.0)
Alkaline Phosphatase: 182 U/L — ABNORMAL HIGH (ref 38–126)
Anion gap: 11 (ref 5–15)
BUN: 21 mg/dL (ref 8–23)
CO2: 26 mmol/L (ref 22–32)
Calcium: 10.3 mg/dL (ref 8.9–10.3)
Chloride: 105 mmol/L (ref 98–111)
Creatinine: 1.17 mg/dL (ref 0.61–1.24)
GFR, Estimated: >60 mL/min (ref >60)
Glucose, Bld: 92 mg/dL (ref 70–99)
Potassium: 3.8 mmol/L (ref 3.5–5.1)
Sodium: 142 mmol/L (ref 135–145)
Total Bilirubin: 0.8 mg/dL (ref 0.3–1.2)
Total Protein: 7.9 g/dL (ref 6.5–8.1)

## 2022-06-03 LAB — TOTAL PROTEIN, URINE DIPSTICK: Protein, ur: NEGATIVE mg/dL

## 2022-06-03 LAB — TSH: TSH: 1.255 u[IU]/mL (ref 0.350–4.500)

## 2022-06-03 MED ORDER — SODIUM CHLORIDE 0.9% FLUSH
10.0000 mL | Freq: Once | INTRAVENOUS | Status: AC | PRN
  Administered 2022-06-03: 10 mL

## 2022-06-03 MED ORDER — SODIUM CHLORIDE 0.9 % IV SOLN
1200.0000 mg | Freq: Once | INTRAVENOUS | Status: AC
  Administered 2022-06-03: 1200 mg via INTRAVENOUS
  Filled 2022-06-03: qty 20

## 2022-06-03 MED ORDER — SODIUM CHLORIDE 0.9% FLUSH
10.0000 mL | INTRAVENOUS | Status: DC | PRN
  Administered 2022-06-03: 10 mL

## 2022-06-03 MED ORDER — HEPARIN SOD (PORK) LOCK FLUSH 100 UNIT/ML IV SOLN
500.0000 [IU] | Freq: Once | INTRAVENOUS | Status: AC | PRN
  Administered 2022-06-03: 500 [IU]

## 2022-06-03 MED ORDER — SODIUM CHLORIDE 0.9 % IV SOLN: Freq: Once | INTRAVENOUS | Status: AC

## 2022-06-03 MED ORDER — SODIUM CHLORIDE 0.9 % IV SOLN
15.0000 mg/kg | Freq: Once | INTRAVENOUS | Status: AC
  Administered 2022-06-03: 900 mg via INTRAVENOUS
  Filled 2022-06-03: qty 32

## 2022-06-03 MED ORDER — ONDANSETRON HCL 8 MG PO TABS: 8.0000 mg | ORAL_TABLET | Freq: Three times a day (TID) | ORAL | 2 refills | Status: DC | PRN

## 2022-06-03 NOTE — Patient Instructions (Signed)

## 2022-06-03 NOTE — Patient Instructions (Signed)
[facility]  Discharge Instructions: Thank you for choosing [facility] to provide your oncology and hematology care.   If you have a lab appointment with the Cancer Center, please go directly to the Cancer Center and check in at the registration area.   Wear comfortable clothing and clothing appropriate for easy access to any Portacath or PICC line.   We strive to give you quality time with your provider. You may need to reschedule your appointment if you arrive late (15 or more minutes).  Arriving late affects you and other patients whose appointments are after yours.  Also, if you miss three or more appointments without notifying the office, you may be dismissed from the clinic at the provider's discretion.      For prescription refill requests, have your pharmacy contact our office and allow 72 hours for refills to be completed.    Today you received the following chemotherapy and/or immunotherapy agents Bevacizumab, Atezolizumab   To help prevent nausea and vomiting after your treatment, we encourage you to take your nausea medication as directed.  BELOW ARE SYMPTOMS THAT SHOULD BE REPORTED IMMEDIATELY: *FEVER GREATER THAN 100.4 F (38 C) OR HIGHER *CHILLS OR SWEATING *NAUSEA AND VOMITING THAT IS NOT CONTROLLED WITH YOUR NAUSEA MEDICATION *UNUSUAL SHORTNESS OF BREATH *UNUSUAL BRUISING OR BLEEDING *URINARY PROBLEMS (pain or burning when urinating, or frequent urination) *BOWEL PROBLEMS (unusual diarrhea, constipation, pain near the anus) TENDERNESS IN MOUTH AND THROAT WITH OR WITHOUT PRESENCE OF ULCERS (sore throat, sores in mouth, or a toothache) UNUSUAL RASH, SWELLING OR PAIN  UNUSUAL VAGINAL DISCHARGE OR ITCHING   Items with * indicate a potential emergency and should be followed up as soon as possible or go to the Emergency Department if any problems should occur.  Please show the CHEMOTHERAPY ALERT CARD or IMMUNOTHERAPY ALERT  CARD at check-in to the Emergency Department and triage nurse.  Should you have questions after your visit or need to cancel or reschedule your appointment, please contact [facility]  Dept: [phone]  and follow the prompts.  Office hours are 8:00 a.m. to 4:30 p.m. Monday - Friday. Please note that voicemails left after 4:00 p.m. may not be returned until the following business day.  We are closed weekends and major holidays. You have access to a nurse at all times for urgent questions. Please call the main number to the clinic Dept: [phone] and follow the prompts.   For any non-urgent questions, you may also contact your provider using MyChart. We now offer e-Visits for anyone 18 and older to request care online for non-urgent symptoms. For details visit [email or web address].   Also download the MyChart app! Go to the app store, search "MyChart", open the app, select [email or web address], and log in with your MyChart username and password. 

## 2022-06-04 ENCOUNTER — Telehealth: Payer: Self-pay | Admitting: Hematology

## 2022-06-04 NOTE — Telephone Encounter (Signed)
Spoke with patient spouse confirming upcoming appointments  

## 2022-06-05 LAB — T4: T4, Total: 7.1 ug/dL (ref 4.5–12.0)

## 2022-06-10 ENCOUNTER — Ambulatory Visit
Admission: RE | Admit: 2022-06-10 | Discharge: 2022-06-10 | Disposition: A | Discharge status: Home / Self Care | Payer: Medicare Other | Source: Ambulatory Visit | Attending: Interventional Radiology | Admitting: Interventional Radiology

## 2022-06-10 DIAGNOSIS — C220 Liver cell carcinoma: Secondary | ICD-10-CM

## 2022-06-10 HISTORY — PX: IR RADIOLOGIST EVAL & MGMT: IMG5224
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
## 2022-06-24 NOTE — Assessment & Plan Note (Signed)
-  likely related to his liver cancer and liver cirrhosis  -continue antiemetics  -improved lately
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
## 2022-06-24 NOTE — Assessment & Plan Note (Signed)
cT3N1M0, stage IVA, G3 -history of cirrhosis since ~2011 and hepatitis C diagnosed and treated in 2018 -diagnosed by liver biopsy on 11/04/21, baseline AFP normal  -s/p palliative radiation under Dr. Lisbeth Renshaw, 7/20-12/17/21 -he began Tecentriq and bevacizumab on 12/06/21. Tolerating well overall. 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 Maryelizabeth Kaufmann. -Restaging CT scan from April 21, 2022 showed excellent partial response to treatment, reviewed with patient and his wife today. -treatment held in Dec 2023 due to hospital admission for N/V and poor oral intake. Restarted on 05/14/2022

## 2022-06-24 NOTE — Assessment & Plan Note (Addendum)
--  history of cirrhosis since ~2011 and hepatitis C diagnosed and treated in 2018 per pt  -f/u with GI

## 2022-06-25 ENCOUNTER — Inpatient Hospital Stay: Payer: No Typology Code available for payment source

## 2022-06-25 ENCOUNTER — Inpatient Hospital Stay (HOSPITAL_BASED_OUTPATIENT_CLINIC_OR_DEPARTMENT_OTHER): Payer: No Typology Code available for payment source | Admitting: Nurse Practitioner

## 2022-06-25 ENCOUNTER — Other Ambulatory Visit: Payer: Self-pay

## 2022-06-25 ENCOUNTER — Inpatient Hospital Stay: Payer: No Typology Code available for payment source | Attending: Hematology

## 2022-06-25 ENCOUNTER — Encounter: Payer: Self-pay | Admitting: Hematology

## 2022-06-25 ENCOUNTER — Inpatient Hospital Stay (HOSPITAL_BASED_OUTPATIENT_CLINIC_OR_DEPARTMENT_OTHER): Payer: No Typology Code available for payment source | Admitting: Hematology

## 2022-06-25 ENCOUNTER — Encounter: Payer: Self-pay | Admitting: Nurse Practitioner

## 2022-06-25 VITALS — BP 128/78 | HR 78 | Temp 98.2°F | Resp 18

## 2022-06-25 VITALS — BP 133/81 | HR 87 | Temp 98.3°F | Resp 17 | Ht 71.0 in | Wt 129.2 lb

## 2022-06-25 DIAGNOSIS — R112 Nausea with vomiting, unspecified: Secondary | ICD-10-CM | POA: Diagnosis not present

## 2022-06-25 DIAGNOSIS — K7460 Unspecified cirrhosis of liver: Secondary | ICD-10-CM

## 2022-06-25 DIAGNOSIS — R634 Abnormal weight loss: Secondary | ICD-10-CM

## 2022-06-25 DIAGNOSIS — C220 Liver cell carcinoma: Secondary | ICD-10-CM | POA: Insufficient documentation

## 2022-06-25 DIAGNOSIS — E860 Dehydration: Secondary | ICD-10-CM

## 2022-06-25 DIAGNOSIS — R530 Neoplastic (malignant) related fatigue: Secondary | ICD-10-CM

## 2022-06-25 DIAGNOSIS — K766 Portal hypertension: Secondary | ICD-10-CM | POA: Diagnosis not present

## 2022-06-25 DIAGNOSIS — I864 Gastric varices: Secondary | ICD-10-CM | POA: Insufficient documentation

## 2022-06-25 DIAGNOSIS — C787 Secondary malignant neoplasm of liver and intrahepatic bile duct: Secondary | ICD-10-CM | POA: Diagnosis not present

## 2022-06-25 DIAGNOSIS — I81 Portal vein thrombosis: Secondary | ICD-10-CM | POA: Diagnosis not present

## 2022-06-25 DIAGNOSIS — I700 Atherosclerosis of aorta: Secondary | ICD-10-CM | POA: Diagnosis not present

## 2022-06-25 DIAGNOSIS — Z5112 Encounter for antineoplastic immunotherapy: Secondary | ICD-10-CM | POA: Diagnosis present

## 2022-06-25 DIAGNOSIS — K828 Other specified diseases of gallbladder: Secondary | ICD-10-CM | POA: Diagnosis not present

## 2022-06-25 DIAGNOSIS — Z7901 Long term (current) use of anticoagulants: Secondary | ICD-10-CM | POA: Diagnosis not present

## 2022-06-25 DIAGNOSIS — Z79899 Other long term (current) drug therapy: Secondary | ICD-10-CM | POA: Insufficient documentation

## 2022-06-25 DIAGNOSIS — G893 Neoplasm related pain (acute) (chronic): Secondary | ICD-10-CM | POA: Diagnosis not present

## 2022-06-25 DIAGNOSIS — R630 Anorexia: Secondary | ICD-10-CM | POA: Diagnosis not present

## 2022-06-25 DIAGNOSIS — B182 Chronic viral hepatitis C: Secondary | ICD-10-CM | POA: Diagnosis not present

## 2022-06-25 DIAGNOSIS — K7469 Other cirrhosis of liver: Secondary | ICD-10-CM | POA: Diagnosis not present

## 2022-06-25 DIAGNOSIS — I868 Varicose veins of other specified sites: Secondary | ICD-10-CM | POA: Insufficient documentation

## 2022-06-25 DIAGNOSIS — E119 Type 2 diabetes mellitus without complications: Secondary | ICD-10-CM | POA: Insufficient documentation

## 2022-06-25 DIAGNOSIS — Z515 Encounter for palliative care: Secondary | ICD-10-CM | POA: Diagnosis not present

## 2022-06-25 LAB — CMP (CANCER CENTER ONLY)
ALT: 30 U/L (ref 0–44)
AST: 68 U/L — ABNORMAL HIGH (ref 15–41)
Albumin: 3.6 g/dL (ref 3.5–5.0)
Alkaline Phosphatase: 162 U/L — ABNORMAL HIGH (ref 38–126)
Anion gap: 7 (ref 5–15)
BUN: 13 mg/dL (ref 8–23)
CO2: 25 mmol/L (ref 22–32)
Calcium: 9.9 mg/dL (ref 8.9–10.3)
Chloride: 107 mmol/L (ref 98–111)
Creatinine: 1.01 mg/dL (ref 0.61–1.24)
GFR, Estimated: >60 mL/min (ref >60)
Glucose, Bld: 208 mg/dL — ABNORMAL HIGH (ref 70–99)
Potassium: 4 mmol/L (ref 3.5–5.1)
Sodium: 139 mmol/L (ref 135–145)
Total Bilirubin: 1 mg/dL (ref 0.3–1.2)
Total Protein: 7.6 g/dL (ref 6.5–8.1)

## 2022-06-25 LAB — CBC WITH DIFFERENTIAL (CANCER CENTER ONLY)
Abs Immature Granulocytes: 0 10*3/uL (ref 0.00–0.07)
Basophils Absolute: 0 10*3/uL (ref 0.0–0.1)
Basophils Relative: 1 %
Eosinophils Absolute: 0.1 10*3/uL (ref 0.0–0.5)
Eosinophils Relative: 2 %
HCT: 33.7 % — ABNORMAL LOW (ref 39.0–52.0)
Hemoglobin: 11.4 g/dL — ABNORMAL LOW (ref 13.0–17.0)
Immature Granulocytes: 0 %
Lymphocytes Relative: 18 %
Lymphs Abs: 0.7 10*3/uL (ref 0.7–4.0)
MCH: 30.2 pg (ref 26.0–34.0)
MCHC: 33.8 g/dL (ref 30.0–36.0)
MCV: 89.2 fL (ref 80.0–100.0)
Monocytes Absolute: 0.5 10*3/uL (ref 0.1–1.0)
Monocytes Relative: 12 %
Neutro Abs: 2.6 10*3/uL (ref 1.7–7.7)
Neutrophils Relative %: 67 %
Platelet Count: 99 10*3/uL — ABNORMAL LOW (ref 150–400)
RBC: 3.78 MIL/uL — ABNORMAL LOW (ref 4.22–5.81)
RDW: 14.6 % (ref 11.5–15.5)
WBC Count: 3.9 10*3/uL — ABNORMAL LOW (ref 4.0–10.5)
nRBC: 0 % (ref 0.0–0.2)

## 2022-06-25 LAB — TOTAL PROTEIN, URINE DIPSTICK: Protein, ur: NEGATIVE mg/dL

## 2022-06-25 LAB — TSH: TSH: 0.699 u[IU]/mL (ref 0.350–4.500)

## 2022-06-25 MED ORDER — SODIUM CHLORIDE 0.9 % IV SOLN: Freq: Once | INTRAVENOUS | Status: AC

## 2022-06-25 MED ORDER — SODIUM CHLORIDE 0.9 % IV SOLN
15.0000 mg/kg | Freq: Once | INTRAVENOUS | Status: AC
  Administered 2022-06-25: 900 mg via INTRAVENOUS
  Filled 2022-06-25: qty 4

## 2022-06-25 MED ORDER — MORPHINE SULFATE ER 15 MG PO TBCR: 15.0000 mg | EXTENDED_RELEASE_TABLET | Freq: Two times a day (BID) | ORAL | 0 refills | Status: DC

## 2022-06-25 MED ORDER — MORPHINE SULFATE 20 MG/5ML PO SOLN: 2.5000 mg | ORAL | 0 refills | Status: DC | PRN

## 2022-06-25 MED ORDER — SODIUM CHLORIDE 0.9% FLUSH
10.0000 mL | Freq: Once | INTRAVENOUS | Status: AC
  Administered 2022-06-25: 10 mL

## 2022-06-25 MED ORDER — SODIUM CHLORIDE 0.9 % IV SOLN
1200.0000 mg | Freq: Once | INTRAVENOUS | Status: AC
  Administered 2022-06-25: 1200 mg via INTRAVENOUS
  Filled 2022-06-25: qty 20

## 2022-06-25 MED ORDER — SODIUM CHLORIDE 0.9% FLUSH: 10.0000 mL | INTRAVENOUS | Status: DC | PRN

## 2022-06-25 MED ORDER — HEPARIN SOD (PORK) LOCK FLUSH 100 UNIT/ML IV SOLN: 500.0000 [IU] | Freq: Once | INTRAVENOUS | Status: DC | PRN

## 2022-06-25 NOTE — Progress Notes (Signed)
Ok to proceed wioth treatment today per Dr. Burr Medico with Plt count of 99
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
## 2022-06-25 NOTE — Patient Instructions (Signed)
[facility]  Discharge Instructions: Thank you for choosing [facility] to provide your oncology and hematology care.   If you have a lab appointment with the [facility], please go directly to the [facility] and check in at the registration area.   Wear comfortable clothing and clothing appropriate for easy access to any Portacath or PICC line.   We strive to give you quality time with your provider. You may need to reschedule your appointment if you arrive late (15 or more minutes).  Arriving late affects you and other patients whose appointments are after yours.  Also, if you miss three or more appointments without notifying the office, you may be dismissed from the clinic at the provider's discretion.      For prescription refill requests, have your pharmacy contact our office and allow 72 hours for refills to be completed.    Today you received the following chemotherapy and/or immunotherapy agents Bevacizumab, Atezolizumab   To help prevent nausea and vomiting after your treatment, we encourage you to take your nausea medication as directed.  BELOW ARE SYMPTOMS THAT SHOULD BE REPORTED IMMEDIATELY: *FEVER GREATER THAN 100.4 F (38 C) OR HIGHER *CHILLS OR SWEATING *NAUSEA AND VOMITING THAT IS NOT CONTROLLED WITH YOUR NAUSEA MEDICATION *UNUSUAL SHORTNESS OF BREATH *UNUSUAL BRUISING OR BLEEDING *URINARY PROBLEMS (pain or burning when urinating, or frequent urination) *BOWEL PROBLEMS (unusual diarrhea, constipation, pain near the anus) TENDERNESS IN MOUTH AND THROAT WITH OR WITHOUT PRESENCE OF ULCERS (sore throat, sores in mouth, or a toothache) UNUSUAL RASH, SWELLING OR PAIN  UNUSUAL VAGINAL DISCHARGE OR ITCHING   Items with * indicate a potential emergency and should be followed up as soon as possible or go to the Emergency Department if any problems should occur.  Please show the CHEMOTHERAPY ALERT CARD or IMMUNOTHERAPY ALERT  CARD at check-in to the Emergency Department and triage nurse.  Should you have questions after your visit or need to cancel or reschedule your appointment, please contact [facility]  Dept: [phone]  and follow the prompts.  Office hours are 8:00 a.m. to 4:30 p.m. Monday - Friday. Please note that voicemails left after 4:00 p.m. may not be returned until the following business day.  We are closed weekends and major holidays. You have access to a nurse at all times for urgent questions. Please call the main number to the clinic Dept: [phone] and follow the prompts.   For any non-urgent questions, you may also contact your provider using MyChart. We now offer e-Visits for anyone 30 and older to request care online for non-urgent symptoms. For details visit [email or web address].   Also download the MyChart app! Go to the app store, search "MyChart", open the app, select [facility], and log in with your MyChart username and password.

## 2022-06-27 ENCOUNTER — Other Ambulatory Visit: Payer: Self-pay

## 2022-06-27 LAB — T4: T4, Total: 6 ug/dL (ref 4.5–12.0)

## 2022-06-27 MED ORDER — SENNOSIDES-DOCUSATE SODIUM 8.6-50 MG PO TABS: 1.0000 | ORAL_TABLET | Freq: Two times a day (BID) | ORAL | 2 refills | Status: AC

## 2022-06-27 NOTE — Telephone Encounter (Signed)
Pt wife called for a refill on sennakot, refill sent in

## 2022-07-07 ENCOUNTER — Other Ambulatory Visit: Payer: Self-pay

## 2022-07-07 MED ORDER — APIXABAN 5 MG PO TABS: 5.0000 mg | ORAL_TABLET | Freq: Two times a day (BID) | ORAL | 1 refills | Status: DC

## 2022-07-07 NOTE — Progress Notes (Signed)
Refilled pt's Eliquis per pt's telephone call requesting a refill.  Consulted with Dr. Burr Medico regarding pt's Eliquis d/t last office note does not indicate that the pt will be on lifetime eliquis.  Per Dr. Burr Medico, pt will be on lifetime anticoagulants.  Prescription sent to [facility] per pt's wife's request.

## 2022-07-08 ENCOUNTER — Other Ambulatory Visit: Payer: Self-pay

## 2022-07-08 ENCOUNTER — Other Ambulatory Visit: Payer: Self-pay | Admitting: Nurse Practitioner

## 2022-07-08 ENCOUNTER — Telehealth: Payer: Self-pay

## 2022-07-08 DIAGNOSIS — C220 Liver cell carcinoma: Secondary | ICD-10-CM

## 2022-07-08 DIAGNOSIS — G893 Neoplasm related pain (acute) (chronic): Secondary | ICD-10-CM

## 2022-07-08 DIAGNOSIS — Z515 Encounter for palliative care: Secondary | ICD-10-CM

## 2022-07-08 MED ORDER — MORPHINE SULFATE 20 MG/5ML PO SOLN: 2.5000 mg | ORAL | 0 refills | Status: DC | PRN

## 2022-07-08 NOTE — Telephone Encounter (Signed)
Patient called in yesterday for a refill of morphine liquid. Attempted to send in an e-script, refill was not going through to pt preferred pharmacy. This RN called [facility] who reported that their pharmacy system was down. The options presented were to come in with a paper script or to send script to a local pharmacy. This RN called pt wife [name], and presented these options. Per [name] pt still "has enough" and they will wait and call when they are out.

## 2022-07-08 NOTE — Telephone Encounter (Signed)
Notified by Tammi Sou, RN, that pt called for a refill of morphine, see new orders.

## 2022-07-14 NOTE — Assessment & Plan Note (Signed)
--  history of cirrhosis since ~2011 and hepatitis C diagnosed and treated in 2018 per pt  -f/u with GI

## 2022-07-14 NOTE — Assessment & Plan Note (Signed)
cT3N1M0, stage IVA, G3 -history of cirrhosis since ~2011 and hepatitis C diagnosed and treated in 2018 -diagnosed by liver biopsy on 11/04/21, baseline AFP normal  -s/p palliative radiation under Dr. Lisbeth Renshaw, 7/20-12/17/21 -he began Tecentriq and bevacizumab on 12/06/21. Tolerating well overall. 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 Maryelizabeth Kaufmann. -Restaging CT scan from April 21, 2022 showed excellent partial response to treatment, reviewed with patient and his wife today. -treatment held in Dec 2023 due to hospital admission for N/V and poor oral intake. Restarted on 05/14/2022 -he is clinically doing well overall, and tolerating therapy well

## 2022-07-14 NOTE — Assessment & Plan Note (Signed)
-  likely related to his liver cancer and liver cirrhosis  -continue antiemetics  -improved lately

## 2022-07-16 ENCOUNTER — Other Ambulatory Visit: Payer: Self-pay

## 2022-07-16 ENCOUNTER — Encounter: Payer: Self-pay | Admitting: Hematology

## 2022-07-16 ENCOUNTER — Inpatient Hospital Stay: Payer: No Typology Code available for payment source

## 2022-07-16 ENCOUNTER — Inpatient Hospital Stay: Payer: No Typology Code available for payment source | Admitting: Dietician

## 2022-07-16 ENCOUNTER — Inpatient Hospital Stay: Payer: No Typology Code available for payment source | Attending: Hematology | Admitting: Hematology

## 2022-07-16 VITALS — BP 135/80 | HR 89 | Resp 18

## 2022-07-16 VITALS — BP 132/76 | HR 90 | Temp 97.7°F | Resp 15 | Ht 71.0 in | Wt 138.0 lb

## 2022-07-16 DIAGNOSIS — C220 Liver cell carcinoma: Secondary | ICD-10-CM | POA: Insufficient documentation

## 2022-07-16 DIAGNOSIS — Z5112 Encounter for antineoplastic immunotherapy: Secondary | ICD-10-CM | POA: Insufficient documentation

## 2022-07-16 DIAGNOSIS — E119 Type 2 diabetes mellitus without complications: Secondary | ICD-10-CM | POA: Insufficient documentation

## 2022-07-16 DIAGNOSIS — K828 Other specified diseases of gallbladder: Secondary | ICD-10-CM | POA: Diagnosis not present

## 2022-07-16 DIAGNOSIS — K7469 Other cirrhosis of liver: Secondary | ICD-10-CM | POA: Diagnosis not present

## 2022-07-16 DIAGNOSIS — I81 Portal vein thrombosis: Secondary | ICD-10-CM | POA: Insufficient documentation

## 2022-07-16 DIAGNOSIS — I700 Atherosclerosis of aorta: Secondary | ICD-10-CM | POA: Insufficient documentation

## 2022-07-16 DIAGNOSIS — Z79899 Other long term (current) drug therapy: Secondary | ICD-10-CM | POA: Diagnosis not present

## 2022-07-16 DIAGNOSIS — I864 Gastric varices: Secondary | ICD-10-CM | POA: Insufficient documentation

## 2022-07-16 DIAGNOSIS — I868 Varicose veins of other specified sites: Secondary | ICD-10-CM | POA: Diagnosis not present

## 2022-07-16 DIAGNOSIS — K5900 Constipation, unspecified: Secondary | ICD-10-CM | POA: Diagnosis not present

## 2022-07-16 DIAGNOSIS — K7460 Unspecified cirrhosis of liver: Secondary | ICD-10-CM | POA: Diagnosis not present

## 2022-07-16 DIAGNOSIS — R630 Anorexia: Secondary | ICD-10-CM | POA: Insufficient documentation

## 2022-07-16 DIAGNOSIS — E860 Dehydration: Secondary | ICD-10-CM

## 2022-07-16 DIAGNOSIS — Z7901 Long term (current) use of anticoagulants: Secondary | ICD-10-CM | POA: Diagnosis not present

## 2022-07-16 DIAGNOSIS — R112 Nausea with vomiting, unspecified: Secondary | ICD-10-CM

## 2022-07-16 DIAGNOSIS — K766 Portal hypertension: Secondary | ICD-10-CM | POA: Insufficient documentation

## 2022-07-16 DIAGNOSIS — Z79620 Long term (current) use of immunosuppressive biologic: Secondary | ICD-10-CM | POA: Diagnosis not present

## 2022-07-16 DIAGNOSIS — B182 Chronic viral hepatitis C: Secondary | ICD-10-CM | POA: Diagnosis not present

## 2022-07-16 LAB — CMP (CANCER CENTER ONLY)
ALT: 31 U/L (ref 0–44)
AST: 76 U/L — ABNORMAL HIGH (ref 15–41)
Albumin: 3.6 g/dL (ref 3.5–5.0)
Alkaline Phosphatase: 190 U/L — ABNORMAL HIGH (ref 38–126)
Anion gap: 8 (ref 5–15)
BUN: 22 mg/dL (ref 8–23)
CO2: 27 mmol/L (ref 22–32)
Calcium: 9.9 mg/dL (ref 8.9–10.3)
Chloride: 106 mmol/L (ref 98–111)
Creatinine: 1.47 mg/dL — ABNORMAL HIGH (ref 0.61–1.24)
GFR, Estimated: 52 mL/min — ABNORMAL LOW (ref >60)
Glucose, Bld: 133 mg/dL — ABNORMAL HIGH (ref 70–99)
Potassium: 4.4 mmol/L (ref 3.5–5.1)
Sodium: 141 mmol/L (ref 135–145)
Total Bilirubin: 0.6 mg/dL (ref 0.3–1.2)
Total Protein: 7.9 g/dL (ref 6.5–8.1)

## 2022-07-16 LAB — CBC WITH DIFFERENTIAL (CANCER CENTER ONLY)
Abs Immature Granulocytes: 0.02 10*3/uL (ref 0.00–0.07)
Basophils Absolute: 0 10*3/uL (ref 0.0–0.1)
Basophils Relative: 1 %
Eosinophils Absolute: 0.1 10*3/uL (ref 0.0–0.5)
Eosinophils Relative: 3 %
HCT: 34.1 % — ABNORMAL LOW (ref 39.0–52.0)
Hemoglobin: 11.5 g/dL — ABNORMAL LOW (ref 13.0–17.0)
Immature Granulocytes: 1 %
Lymphocytes Relative: 16 %
Lymphs Abs: 0.6 10*3/uL — ABNORMAL LOW (ref 0.7–4.0)
MCH: 31 pg (ref 26.0–34.0)
MCHC: 33.7 g/dL (ref 30.0–36.0)
MCV: 91.9 fL (ref 80.0–100.0)
Monocytes Absolute: 0.5 10*3/uL (ref 0.1–1.0)
Monocytes Relative: 13 %
Neutro Abs: 2.5 10*3/uL (ref 1.7–7.7)
Neutrophils Relative %: 66 %
Platelet Count: 120 10*3/uL — ABNORMAL LOW (ref 150–400)
RBC: 3.71 MIL/uL — ABNORMAL LOW (ref 4.22–5.81)
RDW: 14.7 % (ref 11.5–15.5)
WBC Count: 3.8 10*3/uL — ABNORMAL LOW (ref 4.0–10.5)
nRBC: 0 % (ref 0.0–0.2)

## 2022-07-16 LAB — TOTAL PROTEIN, URINE DIPSTICK: Protein, ur: NEGATIVE mg/dL

## 2022-07-16 LAB — TSH: TSH: 1.263 u[IU]/mL (ref 0.350–4.500)

## 2022-07-16 MED ORDER — HEPARIN SOD (PORK) LOCK FLUSH 100 UNIT/ML IV SOLN
500.0000 [IU] | Freq: Once | INTRAVENOUS | Status: AC | PRN
  Administered 2022-07-16: 500 [IU]

## 2022-07-16 MED ORDER — SODIUM CHLORIDE 0.9% FLUSH
10.0000 mL | Freq: Once | INTRAVENOUS | Status: AC
  Administered 2022-07-16: 10 mL

## 2022-07-16 MED ORDER — SODIUM CHLORIDE 0.9 % IV SOLN
15.0000 mg/kg | Freq: Once | INTRAVENOUS | Status: AC
  Administered 2022-07-16: 900 mg via INTRAVENOUS
  Filled 2022-07-16: qty 4

## 2022-07-16 MED ORDER — SODIUM CHLORIDE 0.9 % IV SOLN
1200.0000 mg | Freq: Once | INTRAVENOUS | Status: AC
  Administered 2022-07-16: 1200 mg via INTRAVENOUS
  Filled 2022-07-16: qty 20

## 2022-07-16 MED ORDER — SODIUM CHLORIDE 0.9% FLUSH
10.0000 mL | INTRAVENOUS | Status: DC | PRN
  Administered 2022-07-16: 10 mL

## 2022-07-16 MED ORDER — SODIUM CHLORIDE 0.9 % IV SOLN: Freq: Once | INTRAVENOUS | Status: AC

## 2022-07-16 NOTE — Patient Instructions (Signed)
[facility]  Discharge Instructions: Thank you for choosing [facility] to provide your oncology and hematology care.   If you have a lab appointment with the [facility], please go directly to the [facility] and check in at the registration area.   Wear comfortable clothing and clothing appropriate for easy access to any Portacath or PICC line.   We strive to give you quality time with your provider. You may need to reschedule your appointment if you arrive late (15 or more minutes).  Arriving late affects you and other patients whose appointments are after yours.  Also, if you miss three or more appointments without notifying the office, you may be dismissed from the clinic at the provider's discretion.      For prescription refill requests, have your pharmacy contact our office and allow 72 hours for refills to be completed.    Today you received the following chemotherapy and/or immunotherapy agents: Bevacizumab, Tecentriq.       To help prevent nausea and vomiting after your treatment, we encourage you to take your nausea medication as directed.  BELOW ARE SYMPTOMS THAT SHOULD BE REPORTED IMMEDIATELY: *FEVER GREATER THAN 100.4 F (38 C) OR HIGHER *CHILLS OR SWEATING *NAUSEA AND VOMITING THAT IS NOT CONTROLLED WITH YOUR NAUSEA MEDICATION *UNUSUAL SHORTNESS OF BREATH *UNUSUAL BRUISING OR BLEEDING *URINARY PROBLEMS (pain or burning when urinating, or frequent urination) *BOWEL PROBLEMS (unusual diarrhea, constipation, pain near the anus) TENDERNESS IN MOUTH AND THROAT WITH OR WITHOUT PRESENCE OF ULCERS (sore throat, sores in mouth, or a toothache) UNUSUAL RASH, SWELLING OR PAIN  UNUSUAL VAGINAL DISCHARGE OR ITCHING   Items with * indicate a potential emergency and should be followed up as soon as possible or go to the Emergency Department if any problems should occur.  Please show the CHEMOTHERAPY ALERT CARD or IMMUNOTHERAPY  ALERT CARD at check-in to the Emergency Department and triage nurse.  Should you have questions after your visit or need to cancel or reschedule your appointment, please contact [facility]  Dept: [phone]  and follow the prompts.  Office hours are 8:00 a.m. to 4:30 p.m. Monday - Friday. Please note that voicemails left after 4:00 p.m. may not be returned until the following business day.  We are closed weekends and major holidays. You have access to a nurse at all times for urgent questions. Please call the main number to the clinic Dept: [phone] and follow the prompts.   For any non-urgent questions, you may also contact your provider using MyChart. We now offer e-Visits for anyone 58 and older to request care online for non-urgent symptoms. For details visit [email or web address].   Also download the MyChart app! Go to the app store, search "MyChart", open the app, select [facility], and log in with your MyChart username and password.

## 2022-07-16 NOTE — Progress Notes (Signed)
Nutrition Follow-up:  Patient with [facility]. He is currently receiving tecentriq +bevacizumab q21d (start 12/06/21)  Met with patient during infusion. He is in good sprits today. Patient states recent imaging reviewed today with Dr. Burr Medico shows good response to treatment. He has a good appetite and eating well. He drinks a Boost most every day. Patient reports one episode of nausea with vomiting. States episode was related to something he ate. He denies constipation or diarrhea. Patient taking daily stool softener.     Medications: reviewed   Labs: Cr 1.47  Anthropometrics: Wt 138 lb today increased   2/14 - 129 lb 3.2 oz 1/23 - 131 lb 1.6 oz 1/3 - 136 lb 1.6 oz     NUTRITION DIAGNOSIS: Unintended wt loss improved    INTERVENTION:  Continue Boost daily for added calories and protein  Ensure coupons    MONITORING, EVALUATION, GOAL: weight trends, intake   NEXT VISIT: To be scheduled as needed
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
## 2022-07-17 LAB — T4: T4, Total: 6.1 ug/dL (ref 4.5–12.0)

## 2022-07-21 ENCOUNTER — Telehealth: Payer: Self-pay

## 2022-07-21 NOTE — Telephone Encounter (Signed)
-----   Message from [name], MD sent at 07/19/2022  2:54 PM EST ----- Please let pt know his Cr was slightly elevated, encourage him to drink more water, and avoid NSAID'S, thanks  [name]

## 2022-07-21 NOTE — Telephone Encounter (Signed)
Called pt to give recommendations per MD. Pt's wife answered and she was given results and recommendations. She states pt has started drinking liquid IV and asked if this is OK. Advised this was an excellent source of electrolytes and hydration. She verbalized thanks and understanding.

## 2022-07-31 ENCOUNTER — Encounter: Payer: Self-pay | Admitting: Nurse Practitioner

## 2022-07-31 ENCOUNTER — Inpatient Hospital Stay: Payer: No Typology Code available for payment source

## 2022-07-31 ENCOUNTER — Telehealth (HOSPITAL_BASED_OUTPATIENT_CLINIC_OR_DEPARTMENT_OTHER): Payer: No Typology Code available for payment source | Admitting: Nurse Practitioner

## 2022-07-31 ENCOUNTER — Other Ambulatory Visit: Payer: Self-pay

## 2022-07-31 VITALS — BP 154/78 | HR 106 | Resp 18

## 2022-07-31 DIAGNOSIS — E860 Dehydration: Secondary | ICD-10-CM

## 2022-07-31 DIAGNOSIS — R530 Neoplastic (malignant) related fatigue: Secondary | ICD-10-CM

## 2022-07-31 DIAGNOSIS — R110 Nausea: Secondary | ICD-10-CM | POA: Diagnosis not present

## 2022-07-31 DIAGNOSIS — Z5112 Encounter for antineoplastic immunotherapy: Secondary | ICD-10-CM | POA: Diagnosis not present

## 2022-07-31 DIAGNOSIS — C220 Liver cell carcinoma: Secondary | ICD-10-CM

## 2022-07-31 DIAGNOSIS — R630 Anorexia: Secondary | ICD-10-CM | POA: Diagnosis not present

## 2022-07-31 DIAGNOSIS — Z515 Encounter for palliative care: Secondary | ICD-10-CM

## 2022-07-31 LAB — CBC WITH DIFFERENTIAL (CANCER CENTER ONLY)
Abs Immature Granulocytes: 0.01 10*3/uL (ref 0.00–0.07)
Basophils Absolute: 0 10*3/uL (ref 0.0–0.1)
Basophils Relative: 0 %
Eosinophils Absolute: 0.1 10*3/uL (ref 0.0–0.5)
Eosinophils Relative: 1 %
HCT: 36.1 % — ABNORMAL LOW (ref 39.0–52.0)
Hemoglobin: 12 g/dL — ABNORMAL LOW (ref 13.0–17.0)
Immature Granulocytes: 0 %
Lymphocytes Relative: 11 %
Lymphs Abs: 0.5 10*3/uL — ABNORMAL LOW (ref 0.7–4.0)
MCH: 30.4 pg (ref 26.0–34.0)
MCHC: 33.2 g/dL (ref 30.0–36.0)
MCV: 91.4 fL (ref 80.0–100.0)
Monocytes Absolute: 0.5 10*3/uL (ref 0.1–1.0)
Monocytes Relative: 11 %
Neutro Abs: 3.8 10*3/uL (ref 1.7–7.7)
Neutrophils Relative %: 77 %
Platelet Count: 130 10*3/uL — ABNORMAL LOW (ref 150–400)
RBC: 3.95 MIL/uL — ABNORMAL LOW (ref 4.22–5.81)
RDW: 14.6 % (ref 11.5–15.5)
WBC Count: 5 10*3/uL (ref 4.0–10.5)
nRBC: 0 % (ref 0.0–0.2)

## 2022-07-31 LAB — CMP (CANCER CENTER ONLY)
ALT: 35 U/L (ref 0–44)
AST: 79 U/L — ABNORMAL HIGH (ref 15–41)
Albumin: 3.9 g/dL (ref 3.5–5.0)
Alkaline Phosphatase: 204 U/L — ABNORMAL HIGH (ref 38–126)
Anion gap: 14 (ref 5–15)
BUN: 35 mg/dL — ABNORMAL HIGH (ref 8–23)
CO2: 25 mmol/L (ref 22–32)
Calcium: 10.6 mg/dL — ABNORMAL HIGH (ref 8.9–10.3)
Chloride: 106 mmol/L (ref 98–111)
Creatinine: 1.43 mg/dL — ABNORMAL HIGH (ref 0.61–1.24)
GFR, Estimated: 53 mL/min — ABNORMAL LOW (ref >60)
Glucose, Bld: 152 mg/dL — ABNORMAL HIGH (ref 70–99)
Potassium: 4.4 mmol/L (ref 3.5–5.1)
Sodium: 145 mmol/L (ref 135–145)
Total Bilirubin: 1.1 mg/dL (ref 0.3–1.2)
Total Protein: 8.6 g/dL — ABNORMAL HIGH (ref 6.5–8.1)

## 2022-07-31 MED ORDER — SODIUM CHLORIDE 0.9% FLUSH
10.0000 mL | Freq: Once | INTRAVENOUS | Status: AC | PRN
  Administered 2022-07-31: 10 mL

## 2022-07-31 MED ORDER — ONDANSETRON HCL 4 MG/2ML IJ SOLN
8.0000 mg | Freq: Once | INTRAMUSCULAR | Status: AC
  Administered 2022-07-31: 8 mg via INTRAVENOUS
  Filled 2022-07-31: qty 4

## 2022-07-31 MED ORDER — SODIUM CHLORIDE 0.9 % IV SOLN: Freq: Once | INTRAVENOUS | Status: AC

## 2022-07-31 MED ORDER — HEPARIN SOD (PORK) LOCK FLUSH 100 UNIT/ML IV SOLN
500.0000 [IU] | Freq: Once | INTRAVENOUS | Status: AC | PRN
  Administered 2022-07-31: 500 [IU]

## 2022-07-31 MED ORDER — SODIUM CHLORIDE 0.9% FLUSH
10.0000 mL | Freq: Once | INTRAVENOUS | Status: AC
  Administered 2022-07-31: 10 mL

## 2022-07-31 MED ORDER — SODIUM CHLORIDE 0.9 % IV SOLN: Freq: Once | INTRAVENOUS | Status: DC

## 2022-07-31 NOTE — Telephone Encounter (Signed)
I connected with Troy [name] on 07/31/22 at 59 by phone and verified that I am speaking with the correct person using two identifiers.   I discussed the limitations, risks, security and privacy concerns of performing an evaluation and management service by telemedicine and the availability of in-person appointments. I also discussed with the patient that there may be a patient responsible charge related to this service. The patient expressed understanding and agreed to proceed.   Other persons participating in the visit and their role in the encounter: Wife   Patient's location: Home  Provider's location: [facility]   Chief Complaint: Nausea, decreased appetite  I connected by phone with Troy [name] and his wife. Wife shares concerns of patient's minimal appetite over the past 2-3 days. Reports he has been eating like a "bird". Seems weak today which he associates with feelings of dehydration. Wife has been trying to push fluids. Some nausea. He has medications at home and have been taking.   We discussed going to the ER for further evaluation however patient would like to try IVF outpatient if possible allowing him the opportunity to hopefully "bounce back". Patient will be scheduled for 1:30 today for 1Liter IVF and antiemetics for symptom management. Advised if no improvement or symptoms continue over the next 24 hours patient will need to be evaluated for further work-up in local ED. Wife verbalized understanding.   Visit consisted of counseling and education dealing with the complex and emotionally intense issues of symptom management and palliative care in the setting of serious and potentially life-threatening illness.Greater than 50%  of this time was spent counseling and coordinating care related to the above assessment and plan.  Alda Lea, AGPCNP-BC  Palliative Medicine Team/[place]

## 2022-07-31 NOTE — Patient Instructions (Signed)

## 2022-07-31 NOTE — Progress Notes (Signed)
Orders placed under signed and held for todays infusion ppt per Lexine Baton, NP
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
## 2022-08-06 ENCOUNTER — Other Ambulatory Visit: Payer: Self-pay

## 2022-08-06 ENCOUNTER — Inpatient Hospital Stay: Payer: No Typology Code available for payment source

## 2022-08-06 ENCOUNTER — Telehealth: Payer: Self-pay | Admitting: *Deleted

## 2022-08-06 ENCOUNTER — Encounter: Payer: Self-pay | Admitting: Nurse Practitioner

## 2022-08-06 ENCOUNTER — Inpatient Hospital Stay (HOSPITAL_BASED_OUTPATIENT_CLINIC_OR_DEPARTMENT_OTHER): Payer: No Typology Code available for payment source | Admitting: Physician Assistant

## 2022-08-06 ENCOUNTER — Inpatient Hospital Stay (HOSPITAL_BASED_OUTPATIENT_CLINIC_OR_DEPARTMENT_OTHER): Payer: No Typology Code available for payment source | Admitting: Nurse Practitioner

## 2022-08-06 VITALS — BP 135/75 | HR 98 | Resp 18

## 2022-08-06 VITALS — BP 141/79 | HR 102 | Temp 97.3°F | Resp 20 | Wt 135.6 lb

## 2022-08-06 DIAGNOSIS — C220 Liver cell carcinoma: Secondary | ICD-10-CM

## 2022-08-06 DIAGNOSIS — G893 Neoplasm related pain (acute) (chronic): Secondary | ICD-10-CM

## 2022-08-06 DIAGNOSIS — Z5112 Encounter for antineoplastic immunotherapy: Secondary | ICD-10-CM | POA: Diagnosis not present

## 2022-08-06 DIAGNOSIS — D649 Anemia, unspecified: Secondary | ICD-10-CM | POA: Diagnosis not present

## 2022-08-06 DIAGNOSIS — R530 Neoplastic (malignant) related fatigue: Secondary | ICD-10-CM

## 2022-08-06 DIAGNOSIS — Z515 Encounter for palliative care: Secondary | ICD-10-CM | POA: Diagnosis not present

## 2022-08-06 DIAGNOSIS — E860 Dehydration: Secondary | ICD-10-CM

## 2022-08-06 DIAGNOSIS — R110 Nausea: Secondary | ICD-10-CM

## 2022-08-06 LAB — IRON AND IRON BINDING CAPACITY (CC-WL,HP ONLY)
Iron: 73 ug/dL (ref 45–182)
Saturation Ratios: 22 % (ref 17.9–39.5)
TIBC: 337 ug/dL (ref 250–450)
UIBC: 264 ug/dL (ref 117–376)

## 2022-08-06 LAB — CBC WITH DIFFERENTIAL (CANCER CENTER ONLY)
Abs Immature Granulocytes: 0.01 10*3/uL (ref 0.00–0.07)
Basophils Absolute: 0 10*3/uL (ref 0.0–0.1)
Basophils Relative: 1 %
Eosinophils Absolute: 0.1 10*3/uL (ref 0.0–0.5)
Eosinophils Relative: 4 %
HCT: 28.8 % — ABNORMAL LOW (ref 39.0–52.0)
Hemoglobin: 9.7 g/dL — ABNORMAL LOW (ref 13.0–17.0)
Immature Granulocytes: 0 %
Lymphocytes Relative: 15 %
Lymphs Abs: 0.6 10*3/uL — ABNORMAL LOW (ref 0.7–4.0)
MCH: 31.3 pg (ref 26.0–34.0)
MCHC: 33.7 g/dL (ref 30.0–36.0)
MCV: 92.9 fL (ref 80.0–100.0)
Monocytes Absolute: 0.5 10*3/uL (ref 0.1–1.0)
Monocytes Relative: 12 %
Neutro Abs: 2.6 10*3/uL (ref 1.7–7.7)
Neutrophils Relative %: 68 %
Platelet Count: 95 10*3/uL — ABNORMAL LOW (ref 150–400)
RBC: 3.1 MIL/uL — ABNORMAL LOW (ref 4.22–5.81)
RDW: 14.7 % (ref 11.5–15.5)
WBC Count: 3.8 10*3/uL — ABNORMAL LOW (ref 4.0–10.5)
nRBC: 0 % (ref 0.0–0.2)

## 2022-08-06 LAB — CMP (CANCER CENTER ONLY)
ALT: 35 U/L (ref 0–44)
AST: 80 U/L — ABNORMAL HIGH (ref 15–41)
Albumin: 3.4 g/dL — ABNORMAL LOW (ref 3.5–5.0)
Alkaline Phosphatase: 153 U/L — ABNORMAL HIGH (ref 38–126)
Anion gap: 6 (ref 5–15)
BUN: 20 mg/dL (ref 8–23)
CO2: 26 mmol/L (ref 22–32)
Calcium: 9.3 mg/dL (ref 8.9–10.3)
Chloride: 110 mmol/L (ref 98–111)
Creatinine: 1.16 mg/dL (ref 0.61–1.24)
GFR, Estimated: >60 mL/min (ref >60)
Glucose, Bld: 135 mg/dL — ABNORMAL HIGH (ref 70–99)
Potassium: 4.2 mmol/L (ref 3.5–5.1)
Sodium: 142 mmol/L (ref 135–145)
Total Bilirubin: 0.6 mg/dL (ref 0.3–1.2)
Total Protein: 7.5 g/dL (ref 6.5–8.1)

## 2022-08-06 LAB — TSH: TSH: 0.765 u[IU]/mL (ref 0.350–4.500)

## 2022-08-06 LAB — TOTAL PROTEIN, URINE DIPSTICK: Protein, ur: NEGATIVE mg/dL

## 2022-08-06 LAB — FOLATE: Folate: 13.9 ng/mL (ref >5.9)

## 2022-08-06 LAB — FERRITIN: Ferritin: 16 ng/mL — ABNORMAL LOW (ref 24–336)

## 2022-08-06 LAB — VITAMIN B12: Vitamin B-12: 612 pg/mL (ref 180–914)

## 2022-08-06 MED ORDER — SODIUM CHLORIDE 0.9 % IV SOLN
15.0000 mg/kg | Freq: Once | INTRAVENOUS | Status: AC
  Administered 2022-08-06: 900 mg via INTRAVENOUS
  Filled 2022-08-06: qty 4

## 2022-08-06 MED ORDER — HEPARIN SOD (PORK) LOCK FLUSH 100 UNIT/ML IV SOLN
500.0000 [IU] | Freq: Once | INTRAVENOUS | Status: AC | PRN
  Administered 2022-08-06: 500 [IU]

## 2022-08-06 MED ORDER — SODIUM CHLORIDE 0.9% FLUSH
10.0000 mL | Freq: Once | INTRAVENOUS | Status: AC
  Administered 2022-08-06: 10 mL

## 2022-08-06 MED ORDER — SODIUM CHLORIDE 0.9 % IV SOLN: Freq: Once | INTRAVENOUS | Status: AC

## 2022-08-06 MED ORDER — SODIUM CHLORIDE 0.9% FLUSH
10.0000 mL | INTRAVENOUS | Status: DC | PRN
  Administered 2022-08-06: 10 mL

## 2022-08-06 MED ORDER — MORPHINE SULFATE 20 MG/5ML PO SOLN: 2.5000 mg | ORAL | 0 refills | Status: DC | PRN

## 2022-08-06 MED ORDER — SODIUM CHLORIDE 0.9 % IV SOLN
1200.0000 mg | Freq: Once | INTRAVENOUS | Status: AC
  Administered 2022-08-06: 1200 mg via INTRAVENOUS
  Filled 2022-08-06: qty 20

## 2022-08-06 NOTE — Patient Instructions (Signed)
[facility]  Discharge Instructions: Thank you for choosing [facility] to provide your oncology and hematology care.   If you have a lab appointment with the [facility], please go directly to the [facility] and check in at the registration area.   Wear comfortable clothing and clothing appropriate for easy access to any Portacath or PICC line.   We strive to give you quality time with your provider. You may need to reschedule your appointment if you arrive late (15 or more minutes).  Arriving late affects you and other patients whose appointments are after yours.  Also, if you miss three or more appointments without notifying the office, you may be dismissed from the clinic at the provider's discretion.      For prescription refill requests, have your pharmacy contact our office and allow 72 hours for refills to be completed.    Today you received the following chemotherapy and/or immunotherapy agents: MVASI/Tecentriq   To help prevent nausea and vomiting after your treatment, we encourage you to take your nausea medication as directed.  BELOW ARE SYMPTOMS THAT SHOULD BE REPORTED IMMEDIATELY: *FEVER GREATER THAN 100.4 F (38 C) OR HIGHER *CHILLS OR SWEATING *NAUSEA AND VOMITING THAT IS NOT CONTROLLED WITH YOUR NAUSEA MEDICATION *UNUSUAL SHORTNESS OF BREATH *UNUSUAL BRUISING OR BLEEDING *URINARY PROBLEMS (pain or burning when urinating, or frequent urination) *BOWEL PROBLEMS (unusual diarrhea, constipation, pain near the anus) TENDERNESS IN MOUTH AND THROAT WITH OR WITHOUT PRESENCE OF ULCERS (sore throat, sores in mouth, or a toothache) UNUSUAL RASH, SWELLING OR PAIN  UNUSUAL VAGINAL DISCHARGE OR ITCHING   Items with * indicate a potential emergency and should be followed up as soon as possible or go to the Emergency Department if any problems should occur.  Please show the CHEMOTHERAPY ALERT CARD or IMMUNOTHERAPY ALERT CARD at  check-in to the Emergency Department and triage nurse.  Should you have questions after your visit or need to cancel or reschedule your appointment, please contact [facility]  Dept: [phone]  and follow the prompts.  Office hours are 8:00 a.m. to 4:30 p.m. Monday - Friday. Please note that voicemails left after 4:00 p.m. may not be returned until the following business day.  We are closed weekends and major holidays. You have access to a nurse at all times for urgent questions. Please call the main number to the clinic Dept: [phone] and follow the prompts.   For any non-urgent questions, you may also contact your provider using MyChart. We now offer e-Visits for anyone 61 and older to request care online for non-urgent symptoms. For details visit [email or web address].   Also download the MyChart app! Go to the app store, search "MyChart", open the app, select [facility], and log in with your MyChart username and password.

## 2022-08-06 NOTE — Telephone Encounter (Signed)
-----   Message from Lincoln Brigham, PA-C sent at 08/06/2022  3:47 PM EDT ----- Please notify patient that his iron levels are low so we will arrange for IV feraheme once weekly x 2 doses to help bolster levels.

## 2022-08-06 NOTE — Telephone Encounter (Signed)
PC to patient, informed of message below, scheduling will be contacting him to arrange IV iron infusions once a week for two weeks.  Patient verbalizes understanding.
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
## 2022-08-06 NOTE — Progress Notes (Signed)
Per the encounter note from Terisa Starr on 08/06/22 "labs from today reviewed and adequate for treatment".

## 2022-08-07 LAB — T4: T4, Total: 5.8 ug/dL (ref 4.5–12.0)

## 2022-08-08 ENCOUNTER — Telehealth: Payer: Self-pay | Admitting: Physician Assistant

## 2022-08-08 NOTE — Telephone Encounter (Signed)
Per 3/27 LOS reached out to patient left voicemail.

## 2022-08-14 ENCOUNTER — Inpatient Hospital Stay: Payer: No Typology Code available for payment source | Attending: Hematology

## 2022-08-14 ENCOUNTER — Other Ambulatory Visit: Payer: Self-pay

## 2022-08-14 VITALS — BP 137/71 | HR 98 | Temp 98.1°F | Resp 18

## 2022-08-14 DIAGNOSIS — I700 Atherosclerosis of aorta: Secondary | ICD-10-CM | POA: Insufficient documentation

## 2022-08-14 DIAGNOSIS — B182 Chronic viral hepatitis C: Secondary | ICD-10-CM | POA: Diagnosis not present

## 2022-08-14 DIAGNOSIS — Z7901 Long term (current) use of anticoagulants: Secondary | ICD-10-CM | POA: Diagnosis not present

## 2022-08-14 DIAGNOSIS — Z79899 Other long term (current) drug therapy: Secondary | ICD-10-CM | POA: Diagnosis not present

## 2022-08-14 DIAGNOSIS — K3189 Other diseases of stomach and duodenum: Secondary | ICD-10-CM | POA: Diagnosis not present

## 2022-08-14 DIAGNOSIS — J8410 Pulmonary fibrosis, unspecified: Secondary | ICD-10-CM | POA: Insufficient documentation

## 2022-08-14 DIAGNOSIS — E860 Dehydration: Secondary | ICD-10-CM

## 2022-08-14 DIAGNOSIS — I864 Gastric varices: Secondary | ICD-10-CM | POA: Diagnosis not present

## 2022-08-14 DIAGNOSIS — J984 Other disorders of lung: Secondary | ICD-10-CM | POA: Diagnosis not present

## 2022-08-14 DIAGNOSIS — I81 Portal vein thrombosis: Secondary | ICD-10-CM | POA: Insufficient documentation

## 2022-08-14 DIAGNOSIS — M25512 Pain in left shoulder: Secondary | ICD-10-CM | POA: Insufficient documentation

## 2022-08-14 DIAGNOSIS — K7469 Other cirrhosis of liver: Secondary | ICD-10-CM | POA: Insufficient documentation

## 2022-08-14 DIAGNOSIS — C220 Liver cell carcinoma: Secondary | ICD-10-CM | POA: Diagnosis present

## 2022-08-14 DIAGNOSIS — D649 Anemia, unspecified: Secondary | ICD-10-CM | POA: Diagnosis not present

## 2022-08-14 DIAGNOSIS — K766 Portal hypertension: Secondary | ICD-10-CM | POA: Insufficient documentation

## 2022-08-14 DIAGNOSIS — I868 Varicose veins of other specified sites: Secondary | ICD-10-CM | POA: Insufficient documentation

## 2022-08-14 DIAGNOSIS — D62 Acute posthemorrhagic anemia: Secondary | ICD-10-CM | POA: Diagnosis not present

## 2022-08-14 DIAGNOSIS — Z5112 Encounter for antineoplastic immunotherapy: Secondary | ICD-10-CM | POA: Diagnosis present

## 2022-08-14 DIAGNOSIS — E119 Type 2 diabetes mellitus without complications: Secondary | ICD-10-CM | POA: Diagnosis not present

## 2022-08-14 DIAGNOSIS — R079 Chest pain, unspecified: Secondary | ICD-10-CM | POA: Insufficient documentation

## 2022-08-14 MED ORDER — SODIUM CHLORIDE 0.9 % IV SOLN
510.0000 mg | Freq: Once | INTRAVENOUS | Status: AC
  Administered 2022-08-14: 510 mg via INTRAVENOUS
  Filled 2022-08-14: qty 510

## 2022-08-14 MED ORDER — HEPARIN SOD (PORK) LOCK FLUSH 100 UNIT/ML IV SOLN
500.0000 [IU] | Freq: Once | INTRAVENOUS | Status: AC | PRN
  Administered 2022-08-14: 500 [IU]

## 2022-08-14 MED ORDER — SODIUM CHLORIDE 0.9 % IV SOLN: Freq: Once | INTRAVENOUS | Status: DC

## 2022-08-14 MED ORDER — SODIUM CHLORIDE 0.9 % IV SOLN: Freq: Once | INTRAVENOUS | Status: AC

## 2022-08-14 MED ORDER — SODIUM CHLORIDE 0.9% FLUSH
10.0000 mL | Freq: Once | INTRAVENOUS | Status: AC | PRN
  Administered 2022-08-14: 10 mL

## 2022-08-14 NOTE — Patient Instructions (Signed)

## 2022-08-21 ENCOUNTER — Inpatient Hospital Stay: Payer: No Typology Code available for payment source

## 2022-08-21 ENCOUNTER — Other Ambulatory Visit: Payer: Self-pay

## 2022-08-21 VITALS — BP 118/72 | HR 88 | Temp 98.2°F | Resp 18

## 2022-08-21 DIAGNOSIS — E860 Dehydration: Secondary | ICD-10-CM

## 2022-08-21 DIAGNOSIS — C220 Liver cell carcinoma: Secondary | ICD-10-CM | POA: Diagnosis not present

## 2022-08-21 MED ORDER — SODIUM CHLORIDE 0.9 % IV SOLN: Freq: Once | INTRAVENOUS | Status: AC

## 2022-08-21 MED ORDER — SODIUM CHLORIDE 0.9 % IV SOLN
510.0000 mg | Freq: Once | INTRAVENOUS | Status: AC
  Administered 2022-08-21: 510 mg via INTRAVENOUS
  Filled 2022-08-21: qty 510
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
## 2022-08-25 ENCOUNTER — Other Ambulatory Visit: Payer: Self-pay | Admitting: Hematology

## 2022-08-25 ENCOUNTER — Ambulatory Visit (HOSPITAL_COMMUNITY)
Admission: RE | Admit: 2022-08-25 | Discharge: 2022-08-25 | Disposition: A | Discharge status: Home / Self Care | Payer: No Typology Code available for payment source | Source: Ambulatory Visit | Attending: Hematology | Admitting: Hematology

## 2022-08-25 DIAGNOSIS — C220 Liver cell carcinoma: Secondary | ICD-10-CM | POA: Diagnosis not present

## 2022-08-25 DIAGNOSIS — R112 Nausea with vomiting, unspecified: Secondary | ICD-10-CM

## 2022-08-25 DIAGNOSIS — B182 Chronic viral hepatitis C: Secondary | ICD-10-CM

## 2022-08-25 MED ORDER — HEPARIN SOD (PORK) LOCK FLUSH 100 UNIT/ML IV SOLN
500.0000 [IU] | Freq: Once | INTRAVENOUS | Status: AC
  Administered 2022-08-25: 500 [IU] via INTRAVENOUS

## 2022-08-25 MED ORDER — IOHEXOL 300 MG/ML  SOLN
100.0000 mL | Freq: Once | INTRAMUSCULAR | Status: AC | PRN
  Administered 2022-08-25: 100 mL via INTRAVENOUS
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
## 2022-08-26 NOTE — Assessment & Plan Note (Deleted)
cT3N1M0, stage IVA, G3 -history of cirrhosis since ~2011 and hepatitis C diagnosed and treated in 2018 -diagnosed by liver biopsy on 11/04/21, baseline AFP normal  -s/p palliative radiation under Dr. Mitzi Hansen, 7/20-12/17/21 -he began Tecentriq and bevacizumab on 12/06/21. Tolerating well overall. 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 Milford Cage. -Restaging CT scan from April 21, 2022 showed excellent partial response to treatment, reviewed with patient and his wife today. -treatment held in Dec 2023 due to hospital admission for N/V and poor oral intake. Restarted on 05/14/2022 -he is clinically doing well overall, and tolerating therapy well  -He underwent restaging CT scan yesterday, the report is not back yet.

## 2022-08-26 NOTE — Assessment & Plan Note (Deleted)
--  history of cirrhosis since ~2011 and hepatitis C diagnosed and treated in 2018 per pt  -f/u with GI   

## 2022-08-27 ENCOUNTER — Inpatient Hospital Stay: Payer: No Typology Code available for payment source | Admitting: Dietician

## 2022-08-27 ENCOUNTER — Inpatient Hospital Stay: Payer: No Typology Code available for payment source

## 2022-08-27 ENCOUNTER — Inpatient Hospital Stay: Payer: No Typology Code available for payment source | Admitting: Hematology

## 2022-08-27 DIAGNOSIS — B182 Chronic viral hepatitis C: Secondary | ICD-10-CM

## 2022-08-27 DIAGNOSIS — C220 Liver cell carcinoma: Secondary | ICD-10-CM

## 2022-08-28 ENCOUNTER — Other Ambulatory Visit: Payer: Self-pay

## 2022-08-28 ENCOUNTER — Emergency Department (HOSPITAL_COMMUNITY): Payer: No Typology Code available for payment source

## 2022-08-28 ENCOUNTER — Telehealth: Payer: Self-pay

## 2022-08-28 ENCOUNTER — Telehealth: Payer: Self-pay | Admitting: Hematology

## 2022-08-28 ENCOUNTER — Emergency Department (HOSPITAL_COMMUNITY)
Admission: EM | Admit: 2022-08-28 | Discharge: 2022-08-28 | Disposition: A | Discharge status: Home / Self Care | Payer: No Typology Code available for payment source | Attending: Emergency Medicine | Admitting: Emergency Medicine

## 2022-08-28 ENCOUNTER — Encounter (HOSPITAL_COMMUNITY): Payer: Self-pay | Admitting: Emergency Medicine

## 2022-08-28 DIAGNOSIS — D649 Anemia, unspecified: Secondary | ICD-10-CM | POA: Insufficient documentation

## 2022-08-28 DIAGNOSIS — E119 Type 2 diabetes mellitus without complications: Secondary | ICD-10-CM | POA: Diagnosis not present

## 2022-08-28 DIAGNOSIS — K2100 Gastro-esophageal reflux disease with esophagitis, without bleeding: Secondary | ICD-10-CM | POA: Diagnosis not present

## 2022-08-28 DIAGNOSIS — M62838 Other muscle spasm: Secondary | ICD-10-CM | POA: Insufficient documentation

## 2022-08-28 DIAGNOSIS — Z7984 Long term (current) use of oral hypoglycemic drugs: Secondary | ICD-10-CM | POA: Diagnosis not present

## 2022-08-28 DIAGNOSIS — K920 Hematemesis: Secondary | ICD-10-CM | POA: Diagnosis not present

## 2022-08-28 DIAGNOSIS — Z8505 Personal history of malignant neoplasm of liver: Secondary | ICD-10-CM | POA: Insufficient documentation

## 2022-08-28 DIAGNOSIS — K922 Gastrointestinal hemorrhage, unspecified: Secondary | ICD-10-CM

## 2022-08-28 DIAGNOSIS — Z794 Long term (current) use of insulin: Secondary | ICD-10-CM | POA: Diagnosis not present

## 2022-08-28 DIAGNOSIS — R7401 Elevation of levels of liver transaminase levels: Secondary | ICD-10-CM | POA: Insufficient documentation

## 2022-08-28 DIAGNOSIS — Z7901 Long term (current) use of anticoagulants: Secondary | ICD-10-CM | POA: Diagnosis not present

## 2022-08-28 DIAGNOSIS — Z79899 Other long term (current) drug therapy: Secondary | ICD-10-CM | POA: Insufficient documentation

## 2022-08-28 DIAGNOSIS — R1011 Right upper quadrant pain: Secondary | ICD-10-CM | POA: Diagnosis present

## 2022-08-28 DIAGNOSIS — I10 Essential (primary) hypertension: Secondary | ICD-10-CM | POA: Insufficient documentation

## 2022-08-28 HISTORY — DX: Malignant (primary) neoplasm, unspecified: C80.1

## 2022-08-28 LAB — CBC WITH DIFFERENTIAL/PLATELET
Abs Immature Granulocytes: 0.01 10*3/uL (ref 0.00–0.07)
Basophils Absolute: 0 10*3/uL (ref 0.0–0.1)
Basophils Relative: 1 %
Eosinophils Absolute: 0.1 10*3/uL (ref 0.0–0.5)
Eosinophils Relative: 2 %
HCT: 27.9 % — ABNORMAL LOW (ref 39.0–52.0)
Hemoglobin: 8.8 g/dL — ABNORMAL LOW (ref 13.0–17.0)
Immature Granulocytes: 0 %
Lymphocytes Relative: 20 %
Lymphs Abs: 0.7 10*3/uL (ref 0.7–4.0)
MCH: 33 pg (ref 26.0–34.0)
MCHC: 31.5 g/dL (ref 30.0–36.0)
MCV: 104.5 fL — ABNORMAL HIGH (ref 80.0–100.0)
Monocytes Absolute: 0.5 10*3/uL (ref 0.1–1.0)
Monocytes Relative: 13 %
Neutro Abs: 2.4 10*3/uL (ref 1.7–7.7)
Neutrophils Relative %: 64 %
Platelets: 104 10*3/uL — ABNORMAL LOW (ref 150–400)
RBC: 2.67 MIL/uL — ABNORMAL LOW (ref 4.22–5.81)
RDW: 19.9 % — ABNORMAL HIGH (ref 11.5–15.5)
WBC: 3.7 10*3/uL — ABNORMAL LOW (ref 4.0–10.5)
nRBC: 0 % (ref 0.0–0.2)

## 2022-08-28 LAB — COMPREHENSIVE METABOLIC PANEL
ALT: 50 U/L — ABNORMAL HIGH (ref 0–44)
AST: 108 U/L — ABNORMAL HIGH (ref 15–41)
Albumin: 3 g/dL — ABNORMAL LOW (ref 3.5–5.0)
Alkaline Phosphatase: 140 U/L — ABNORMAL HIGH (ref 38–126)
Anion gap: 9 (ref 5–15)
BUN: 15 mg/dL (ref 8–23)
CO2: 21 mmol/L — ABNORMAL LOW (ref 22–32)
Calcium: 8.7 mg/dL — ABNORMAL LOW (ref 8.9–10.3)
Chloride: 112 mmol/L — ABNORMAL HIGH (ref 98–111)
Creatinine, Ser: 0.99 mg/dL (ref 0.61–1.24)
GFR, Estimated: >60 mL/min (ref >60)
Glucose, Bld: 166 mg/dL — ABNORMAL HIGH (ref 70–99)
Potassium: 4.3 mmol/L (ref 3.5–5.1)
Sodium: 142 mmol/L (ref 135–145)
Total Bilirubin: 0.8 mg/dL (ref 0.3–1.2)
Total Protein: 7.2 g/dL (ref 6.5–8.1)

## 2022-08-28 LAB — POC OCCULT BLOOD, ED: Fecal Occult Bld: POSITIVE — AB

## 2022-08-28 LAB — TROPONIN I (HIGH SENSITIVITY)
Troponin I (High Sensitivity): 4 ng/L (ref <18)
Troponin I (High Sensitivity): 5 ng/L (ref <18)

## 2022-08-28 LAB — LIPASE, BLOOD: Lipase: 32 U/L (ref 11–51)

## 2022-08-28 MED ORDER — ALUM & MAG HYDROXIDE-SIMETH 200-200-20 MG/5ML PO SUSP
30.0000 mL | Freq: Once | ORAL | Status: AC
  Administered 2022-08-28: 30 mL via ORAL
  Filled 2022-08-28: qty 30

## 2022-08-28 MED ORDER — METHOCARBAMOL 500 MG PO TABS: 500.0000 mg | ORAL_TABLET | Freq: Two times a day (BID) | ORAL | 0 refills | Status: DC | PRN

## 2022-08-28 MED ORDER — METHOCARBAMOL 500 MG PO TABS
500.0000 mg | ORAL_TABLET | Freq: Once | ORAL | Status: AC
  Administered 2022-08-28: 500 mg via ORAL
  Filled 2022-08-28: qty 1

## 2022-08-28 MED ORDER — HEPARIN SOD (PORK) LOCK FLUSH 100 UNIT/ML IV SOLN
INTRAVENOUS | Status: AC
  Filled 2022-08-28: qty 5

## 2022-08-28 MED ORDER — LACTATED RINGERS IV BOLUS
500.0000 mL | Freq: Once | INTRAVENOUS | Status: AC
  Administered 2022-08-28: 500 mL via INTRAVENOUS
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
## 2022-08-28 NOTE — ED Triage Notes (Signed)
Patient arrives in wheelchair with family c/o burning sensation to epigastric area and a prickly/ tingling sensation to left arm. Wife states about 5am patient woke up with crying/ screaming with right arm cramping up.

## 2022-08-28 NOTE — Telephone Encounter (Signed)
Called patient to rescheduled missed appointments. Patient will call back to schedule them.

## 2022-08-28 NOTE — ED Provider Notes (Signed)
[place] EMERGENCY DEPARTMENT AT [facility] Provider Note   CSN: [id number] Arrival date & time: 08/28/22  4098     History  Chief Complaint  Patient presents with   Chest Pain    Troy [name] is a 67 y.o. male.  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  Patient reports for the last 3 to 4 days he has had significant pain in his bilateral shoulders, worse on the left and in his neck.  The pain in the left shoulder radiates down his arm causing a prickling pins and needle sensation.  Moving his head certain ways in moving his arm seems to make the pain worse.  Also today he reports a burning discomfort in his epigastric and subxiphoid region.  He reports this has come and gone but was much more pronounced this morning.  In the last few days patient has had multiple bowel movements.  He reports he goes between being in normal stool and diarrhea.  It does look black in nature.  However this morning he started having nausea and had an episode of emesis which looked like the food that he had recently eaten.  He has had the spasming and pain in the epigastric region since that time.  He has been experiencing shortness of breath for several months now with any exertion makes him extremely fatigued and winded.  But he also reports feeling slightly short of breath at baseline 2.  He has tried to eat regularly and last immunotherapy was 3 weeks ago.  He accidentally missed his appointment yesterday.  His last iron transfusion was last week.  He denies any urinary symptoms.  He does have some chronic abdominal pain but has been on the same pain medication and reports it is not too bad.  There is been no recent change in his medications or injuries.  Wife does not note any confusion.  Patient denies any fevers.  No known cardiac disease.   Patient has had blood clots in his liver he reports so has been on anticoagulation with Eliquis twice daily which she is compliant with.  The history is provided by the patient, the spouse and medical records.  Chest Pain      Home Medications Prior to Admission medications   Medication Sig Start Date End Date Taking? Authorizing Provider  methocarbamol (ROBAXIN) 500 MG tablet Take 1 tablet (500 mg total) by mouth 2 (two) times daily as needed for muscle spasms. 08/28/22  Yes Ulyess Muto, Alphonzo Lemmings, MD  amLODipine (NORVASC) 10 MG tablet Take 10 mg by mouth daily. 07/22/21 07/23/22  [provider]  apixaban (ELIQUIS) 5 MG TABS tablet Take 1 tablet (5 mg total) by mouth 2 (two) times daily. 07/07/22   [name], MD  carboxymethylcellulose (REFRESH PLUS) 0.5 % SOLN Place 1 drop into both eyes 4 (four) times daily as needed (dry eyes).    [provider]  docusate sodium (COLACE) 100 MG capsule Take 1 capsule (100 mg total) by mouth 2 (two) times daily. 01/23/22   Shon Hough, NP  empagliflozin (JARDIANCE) 25 MG TABS tablet Take 25 mg by mouth daily. 11/04/19   [provider]  ferrous gluconate (FERGON) 324 MG tablet Take 324 mg by mouth daily. 03/17/22   [provider]  hydrocortisone 1 % ointment Apply 1 Application topically 2 (two) times daily as needed  for itching.    [provider]  insulin glargine (LANTUS) 100 UNIT/ML injection Inject 0.15 mLs (15 Units total) into the skin daily. 11/04/21   Lewie Chamber, MD  lisinopril (PRINIVIL,ZESTRIL) 40 MG tablet Take 40 mg by mouth daily.    [provider]  metFORMIN (GLUCOPHAGE) 850 MG tablet Take 850 mg by mouth 2 (two) times daily.    [provider]  morphine (MS CONTIN) 15 MG 12 hr tablet Take 1 tablet (15 mg total) by mouth every 12 (twelve) hours. 06/25/22   Pickenpack-Cousar, Arty Baumgartner, NP  morphine 20 MG/5ML solution Take 0.6-1.3 mLs (2.4-5.2 mg total) by mouth every 4 (four) hours  as needed for pain. 08/06/22   Pickenpack-Cousar, Arty Baumgartner, NP  ondansetron (ZOFRAN) 8 MG tablet Take 1 tablet (8 mg total) by mouth every 8 (eight) hours as needed for nausea or vomiting. 06/03/22   [name], MD  pantoprazole (PROTONIX) 40 MG tablet Take 40 mg by mouth 2 (two) times daily. 02/07/22   [provider]  polyethylene glycol powder (MIRALAX) 17 GM/SCOOP powder Take 255 g by mouth daily. Patient not taking: Reported on 04/22/2022 11/26/21   Pickenpack-Cousar, Arty Baumgartner, NP  promethazine (PHENERGAN) 12.5 MG tablet Take 2 tablets (25 mg total) by mouth every 6 (six) hours as needed for refractory nausea / vomiting. 05/14/22   [name], MD  senna-docusate (SENOKOT-S) 8.6-50 MG tablet Take 1 tablet by mouth 2 (two) times daily. 06/27/22 09/25/22  Pickenpack-Cousar, Arty Baumgartner, NP  prochlorperazine (COMPAZINE) 10 MG tablet Take 1 tablet (10 mg total) by mouth every 6 (six) hours as needed (Nausea or vomiting). 12/03/21 01/16/22  [name], MD      Allergies    Patient has no known allergies.    Review of Systems   Review of Systems  Cardiovascular:  Positive for chest pain.    Physical Exam Updated Vital Signs BP (!) 134/98   Pulse 90   Temp 98.2 F (36.8 C) (Oral)   Resp 13   Ht  (1.803 m)   Wt 61.2 kg   SpO2 98%   BMI 18.83 kg/m  Physical Exam Vitals and nursing note reviewed.  Constitutional:      General: He is not in acute distress.    Appearance: He is well-developed. He is ill-appearing.     Comments: Chronically ill-appearing  HENT:     Head: Normocephalic and atraumatic.  Eyes:     Conjunctiva/sclera: Conjunctivae normal.     Pupils: Pupils are equal, round, and reactive to light.  Neck:      Comments: No swelling noted in the anterior neck.  Muscle spasm and tenderness noted bilaterally which he reports feels good with palpation and localized pressure Cardiovascular:     Rate and Rhythm: Normal rate and regular rhythm.     Heart sounds: No murmur  heard. Pulmonary:     Effort: Pulmonary effort is normal. No respiratory distress.     Breath sounds: Normal breath sounds. No wheezing or rales.     Comments: Port present in the right upper chest without any tenderness or erythema around the port. Abdominal:     General: There is no distension.     Palpations: Abdomen is soft.     Tenderness: There is abdominal tenderness in the right upper quadrant and epigastric area. There is no guarding or rebound.  Musculoskeletal:        General: No tenderness. Normal range of motion.  Cervical back: Normal range of motion and neck supple.     Right lower leg: No edema.     Left lower leg: No edema.  Skin:    General: Skin is warm and dry.     Findings: No erythema or rash.  Neurological:     Mental Status: He is alert and oriented to person, place, and time. Mental status is at baseline.  Psychiatric:        Behavior: Behavior normal.     ED Results / Procedures / Treatments   Labs (all labs ordered are listed, but only abnormal results are displayed) Labs Reviewed  CBC WITH DIFFERENTIAL/PLATELET - Abnormal; Notable for the following components:      Result Value   WBC 3.7 (*)    RBC 2.67 (*)    Hemoglobin 8.8 (*)    HCT 27.9 (*)    MCV 104.5 (*)    RDW 19.9 (*)    Platelets 104 (*)    All other components within normal limits  COMPREHENSIVE METABOLIC PANEL - Abnormal; Notable for the following components:   Chloride 112 (*)    CO2 21 (*)    Glucose, Bld 166 (*)    Calcium 8.7 (*)    Albumin 3.0 (*)    AST 108 (*)    ALT 50 (*)    Alkaline Phosphatase 140 (*)    All other components within normal limits  POC OCCULT BLOOD, ED - Abnormal; Notable for the following components:   Fecal Occult Bld POSITIVE (*)    All other components within normal limits  LIPASE, BLOOD  TROPONIN I (HIGH SENSITIVITY)  TROPONIN I (HIGH SENSITIVITY)    EKG EKG Interpretation  Date/Time:  Thursday August 28 2022 08:07:57 EDT Ventricular  Rate:  97 PR Interval:  116 QRS Duration: 97 QT Interval:  322 QTC Calculation: 409 R Axis:   -10 Text Interpretation: Sinus rhythm Borderline short PR interval Abnormal R-wave progression, early transition Borderline repolarization abnormality No significant change since last tracing Confirmed by Gwyneth Sprout ([id number]) on 08/28/2022 8:33:40 AM  Radiology DG Chest Port 1 View  Result Date: 08/28/2022 CLINICAL DATA:  Chest pain EXAM: PORTABLE CHEST 1 VIEW COMPARISON:  CXR 05/24/20 FINDINGS: Needle accessed right chest port with the tip in the right atrium. Redemonstrated metallic densities in the left upper quadrant/at the left lung base. No pleural effusion. No pneumothorax. Redemonstrated calcified mediastinal and hilar lymphadenopathy. There is also a calcified granuloma in the right lung base. Likely left basilar atelectasis. No radiographically apparent displaced rib fractures. Visualized upper abdomen is unremarkable. IMPRESSION: 1. Likely left basilar atelectasis. Otherwise, no acute cardiopulmonary process. 2. Redemonstrated calcified mediastinal and hilar lymphadenopathy. Electronically Signed   By: Lorenza Cambridge M.D.   On: 08/28/2022 09:18    Procedures Procedures    Medications Ordered in ED Medications  methocarbamol (ROBAXIN) tablet 500 mg (500 mg Oral Given 08/28/22 0959)  alum & mag hydroxide-simeth (MAALOX/MYLANTA) 200-200-20 MG/5ML suspension 30 mL (30 mLs Oral Given 08/28/22 0959)  lactated ringers bolus 500 mL (0 mLs Intravenous Stopped 08/28/22 1039)    ED Course/ Medical Decision Making/ A&P                             Medical Decision Making Amount and/or Complexity of Data Reviewed Independent Historian: spouse External Data Reviewed: notes. Labs: ordered. Decision-making details documented in ED Course. Radiology: ordered and independent interpretation performed. Decision-making details documented in  ED Course. ECG/medicine tests: ordered and independent  interpretation performed. Decision-making details documented in ED Course.  Risk OTC drugs. Prescription drug management.   Pt with multiple medical problems and comorbidities and presenting today with a complaint that caries a high risk for morbidity and mortality.  They with several complaints.  Feel that patient's pain in his neck going down into his arms is separate from the epigastric and subxiphoid pain.  Patient's neck and arm pain seems to be more musculoskeletal as he has palpable muscle spasm and symptoms improved with rubbing the area.  His pulses are equal bilaterally, capillary refill is intact and strength is normal.Low suspicion for spinal cord lesion or that the symptoms are cardiac in nature.  Patient is already anticoagulated with Eliquis but no findings to be concern for upper extremity DVT as he has no swelling in his neck or upper extremities.  He has no anterior neck tenderness. Secondly patient's subxiphoid and epigastric pain will rule out cardiac pathology such as ACS, low suspicion for PE, symptoms not classic for pneumonia, highest concern is of GI etiology.  Could be GERD versus complications from his cancer versus pancreatitis as he did have an episode of vomiting.  Also concern for anemia as the cause of his symptoms. I independently interpreted patient's EKG which shows some nonspecific ST depression which is unchanged from prior EKG in December, patient is in a sinus rhythm.  Labs and imaging are pending.  Patient given a muscle relaxer and GI cocktail.  11:38 AM Patient symptoms are improved after above medications.  I independently interpreted patient's labs and CBC today with a hemoglobin that has trended down to 8.8 from 9.7 and white count of 3.7 with a platelet count of 104, normal troponin of 5, CMP with normal sodium, potassium, creatinine.  LFTs are minimally elevated with an AST of 108 and ALT of 50, lipase within normal limits..  Hemoccult positive today.  Spoke  with Dr. [name] patient's oncologist discussed with her what was going on.  Patient most likely you are losing small amounts of blood even though his stool is brown today.  We are going to discontinue the Eliquis and oncology is going to follow-up with the patient tomorrow and will also evaluate if he needs to be seen by GI.  Findings discussed with the patient and his wife.  Will discharge home with muscle relaxer as that has been quite helpful with his neck and shoulder pain.  He is already on a PPI and he will continue that.  Will hold Eliquis at this time.        Final Clinical Impression(s) / ED Diagnoses Final diagnoses:  Muscle spasm of shoulder region  Acute GI bleeding  Gastroesophageal reflux disease with esophagitis without hemorrhage    Rx / DC Orders ED Discharge Orders          Ordered    methocarbamol (ROBAXIN) 500 MG tablet  2 times daily PRN        08/28/22 1138              Gwyneth Sprout, MD 08/28/22 1138

## 2022-08-28 NOTE — Discharge Instructions (Addendum)
Your blood counts were a little bit lower today and you did have microscopic blood in your stool.  You need to hold Eliquis until Dr. [name] tells you that you can go back on it.  She is going to see you in the office tomorrow and they are going to set up for you to see a gastroenterologist.  Continue taking your reflux medicine but if you have burning and need immediate relief you can use Tums or Maalox.  If your symptoms start getting worse you start vomiting anything that looks like blood or start having episodes of passing out you should return to the emergency room immediately.

## 2022-08-28 NOTE — Telephone Encounter (Signed)
Spoke with pt's wife via telephone to inform her that the pt has an appt on Monday 09/01/2022 at 8am for his chemo infusion.  Pt's wife verbalized understanding and confirmed pt's appt.

## 2022-08-29 ENCOUNTER — Inpatient Hospital Stay (HOSPITAL_BASED_OUTPATIENT_CLINIC_OR_DEPARTMENT_OTHER): Payer: No Typology Code available for payment source | Admitting: Hematology

## 2022-08-29 ENCOUNTER — Inpatient Hospital Stay: Payer: No Typology Code available for payment source

## 2022-08-29 ENCOUNTER — Encounter: Payer: Self-pay | Admitting: Hematology

## 2022-08-29 ENCOUNTER — Emergency Department (HOSPITAL_COMMUNITY)
Admission: EM | Admit: 2022-08-29 | Discharge: 2022-08-30 | Disposition: A | Discharge status: Home / Self Care | Payer: No Typology Code available for payment source | Attending: Emergency Medicine | Admitting: Emergency Medicine

## 2022-08-29 ENCOUNTER — Other Ambulatory Visit: Payer: Self-pay

## 2022-08-29 VITALS — BP 132/69 | HR 89 | Temp 98.1°F | Resp 20 | Wt 133.7 lb

## 2022-08-29 DIAGNOSIS — Z7901 Long term (current) use of anticoagulants: Secondary | ICD-10-CM | POA: Diagnosis not present

## 2022-08-29 DIAGNOSIS — Y69 Unspecified misadventure during surgical and medical care: Secondary | ICD-10-CM | POA: Insufficient documentation

## 2022-08-29 DIAGNOSIS — C220 Liver cell carcinoma: Secondary | ICD-10-CM

## 2022-08-29 DIAGNOSIS — T82598A Other mechanical complication of other cardiac and vascular devices and implants, initial encounter: Secondary | ICD-10-CM | POA: Diagnosis present

## 2022-08-29 DIAGNOSIS — Z789 Other specified health status: Secondary | ICD-10-CM

## 2022-08-29 LAB — CBC WITH DIFFERENTIAL (CANCER CENTER ONLY)
Abs Immature Granulocytes: 0.02 10*3/uL (ref 0.00–0.07)
Basophils Absolute: 0 10*3/uL (ref 0.0–0.1)
Basophils Relative: 1 %
Eosinophils Absolute: 0.1 10*3/uL (ref 0.0–0.5)
Eosinophils Relative: 3 %
HCT: 26.9 % — ABNORMAL LOW (ref 39.0–52.0)
Hemoglobin: 8.7 g/dL — ABNORMAL LOW (ref 13.0–17.0)
Immature Granulocytes: 1 %
Lymphocytes Relative: 18 %
Lymphs Abs: 0.6 10*3/uL — ABNORMAL LOW (ref 0.7–4.0)
MCH: 33.1 pg (ref 26.0–34.0)
MCHC: 32.3 g/dL (ref 30.0–36.0)
MCV: 102.3 fL — ABNORMAL HIGH (ref 80.0–100.0)
Monocytes Absolute: 0.5 10*3/uL (ref 0.1–1.0)
Monocytes Relative: 15 %
Neutro Abs: 2.2 10*3/uL (ref 1.7–7.7)
Neutrophils Relative %: 62 %
Platelet Count: 94 10*3/uL — ABNORMAL LOW (ref 150–400)
RBC: 2.63 MIL/uL — ABNORMAL LOW (ref 4.22–5.81)
RDW: 19.4 % — ABNORMAL HIGH (ref 11.5–15.5)
WBC Count: 3.5 10*3/uL — ABNORMAL LOW (ref 4.0–10.5)
nRBC: 0 % (ref 0.0–0.2)

## 2022-08-29 LAB — CMP (CANCER CENTER ONLY)
ALT: 40 U/L (ref 0–44)
AST: 82 U/L — ABNORMAL HIGH (ref 15–41)
Albumin: 3.3 g/dL — ABNORMAL LOW (ref 3.5–5.0)
Alkaline Phosphatase: 144 U/L — ABNORMAL HIGH (ref 38–126)
Anion gap: 7 (ref 5–15)
BUN: 18 mg/dL (ref 8–23)
CO2: 26 mmol/L (ref 22–32)
Calcium: 9.2 mg/dL (ref 8.9–10.3)
Chloride: 107 mmol/L (ref 98–111)
Creatinine: 1.14 mg/dL (ref 0.61–1.24)
GFR, Estimated: >60 mL/min (ref >60)
Glucose, Bld: 154 mg/dL — ABNORMAL HIGH (ref 70–99)
Potassium: 3.9 mmol/L (ref 3.5–5.1)
Sodium: 140 mmol/L (ref 135–145)
Total Bilirubin: 0.6 mg/dL (ref 0.3–1.2)
Total Protein: 7.1 g/dL (ref 6.5–8.1)

## 2022-08-29 LAB — TOTAL PROTEIN, URINE DIPSTICK: Protein, ur: NEGATIVE mg/dL

## 2022-08-29 LAB — TSH: TSH: 1.976 u[IU]/mL (ref 0.350–4.500)

## 2022-08-29 MED ORDER — HEPARIN SOD (PORK) LOCK FLUSH 100 UNIT/ML IV SOLN
500.0000 [IU] | Freq: Once | INTRAVENOUS | Status: AC
  Administered 2022-08-29: 500 [IU]
  Filled 2022-08-29: qty 5

## 2022-08-29 NOTE — ED Triage Notes (Signed)
Patient coming to ED to have Port-a-cath de-accessed.  Reports he had port accessed today to have blood work completed.  Normally he has chemo after blood draw, but today he did not received infusion.  Went home and forgot that port was accessed.  No complaints voiced.  No fevers.

## 2022-08-29 NOTE — ED Provider Notes (Signed)
Troy [name] AT [facility] Provider Note   CSN: [id number] Arrival date & time: 08/29/22  2213     History  Chief Complaint  Patient presents with   Vascular Access Problem    Troy [name] is a 67 y.o. male.  Patient is here to have his Port-A-Cath site deaccessed.  He had lab work done today at oncology office and he left without having the port be accessed.  He was having some GI symptoms that have now resolved.  He had his blood checked today and was unremarkable.  He has no symptoms right now and is here to have his port be accessed.  The history is provided by the patient.       Home Medications Prior to Admission medications   Medication Sig Start Date End Date Taking? Authorizing Provider  amLODipine (NORVASC) 10 MG tablet Take 10 mg by mouth daily. 07/22/21 07/23/22  [provider]  apixaban (ELIQUIS) 5 MG TABS tablet Take 1 tablet (5 mg total) by mouth 2 (two) times daily. 07/07/22   [name], MD  carboxymethylcellulose (REFRESH PLUS) 0.5 % SOLN Place 1 drop into both eyes 4 (four) times daily as needed (dry eyes).    [provider]  docusate sodium (COLACE) 100 MG capsule Take 1 capsule (100 mg total) by mouth 2 (two) times daily. 01/23/22   Shon Hough, NP  empagliflozin (JARDIANCE) 25 MG TABS tablet Take 25 mg by mouth daily. 11/04/19   [provider]  ferrous gluconate (FERGON) 324 MG tablet Take 324 mg by mouth daily. 03/17/22   [provider]  hydrocortisone 1 % ointment Apply 1 Application topically 2 (two) times daily as needed for itching.    [provider]  insulin glargine (LANTUS) 100 UNIT/ML injection Inject 0.15 mLs (15 Units total) into the skin daily. 11/04/21   Lewie Chamber, MD  lisinopril (PRINIVIL,ZESTRIL) 40 MG tablet Take 40 mg by mouth daily.    [provider]  metFORMIN (GLUCOPHAGE) 850 MG tablet Take 850 mg by mouth 2 (two) times daily.    [provider]  methocarbamol (ROBAXIN) 500 MG tablet Take 1 tablet (500 mg total) by mouth 2 (two) times daily as needed for muscle spasms. 08/28/22   Gwyneth Sprout, MD  morphine (MS CONTIN) 15 MG 12 hr tablet Take 1 tablet (15 mg total) by mouth every 12 (twelve) hours. 06/25/22   Pickenpack-Cousar, Arty Baumgartner, NP  morphine 20 MG/5ML solution Take 0.6-1.3 mLs (2.4-5.2 mg total) by mouth every 4 (four) hours as needed for pain. 08/06/22   Pickenpack-Cousar, Arty Baumgartner, NP  ondansetron (ZOFRAN) 8 MG tablet Take 1 tablet (8 mg total) by mouth every 8 (eight) hours as needed for nausea or vomiting. 06/03/22   [name], MD  pantoprazole (PROTONIX) 40 MG tablet Take 40 mg by mouth 2 (two) times daily. 02/07/22   [provider]  polyethylene glycol powder (MIRALAX) 17 GM/SCOOP powder Take 255 g by mouth daily. Patient not taking: Reported on 04/22/2022 11/26/21   Pickenpack-Cousar, Arty Baumgartner, NP  promethazine (PHENERGAN) 12.5 MG tablet Take 2 tablets (25 mg total) by mouth every 6 (six) hours as needed for refractory nausea / vomiting. 05/14/22   [name], MD  senna-docusate (SENOKOT-S) 8.6-50 MG tablet Take 1 tablet by mouth 2 (two) times daily. 06/27/22 09/25/22  Pickenpack-Cousar, Arty Baumgartner, NP  prochlorperazine (COMPAZINE) 10 MG tablet Take 1 tablet (10 mg total) by mouth every 6 (six) hours as  needed (Nausea or vomiting). 12/03/21 01/16/22  [name], MD      Allergies    Patient has no known allergies.    Review of Systems   Review of Systems  Physical Exam Updated Vital Signs  Triage vitals had a blood pressure of 86/61 however he was wearing his coat and not wearing appropriately sized blood pressure cuff.  When I checked with his coat off with a new blood pressure cuff blood pressure 102/61.  Physical Exam Vitals and nursing note reviewed.  Constitutional:      General: He is not in acute distress.    Appearance: He is well-developed.  HENT:     Head: Normocephalic and atraumatic.      Nose: Nose normal.     Mouth/Throat:     Mouth: Mucous membranes are moist.  Eyes:     Extraocular Movements: Extraocular movements intact.     Conjunctiva/sclera: Conjunctivae normal.     Pupils: Pupils are equal, round, and reactive to light.  Cardiovascular:     Rate and Rhythm: Normal rate and regular rhythm.     Pulses: Normal pulses.     Heart sounds: Normal heart sounds. No murmur heard. Pulmonary:     Effort: Pulmonary effort is normal. No respiratory distress.     Breath sounds: Normal breath sounds.  Abdominal:     Palpations: Abdomen is soft.     Tenderness: There is no abdominal tenderness.  Musculoskeletal:        General: No swelling.     Cervical back: Neck supple.  Skin:    General: Skin is warm and dry.     Capillary Refill: Capillary refill takes less than 2 seconds.  Neurological:     Mental Status: He is alert.  Psychiatric:        Mood and Affect: Mood normal.     ED Results / Procedures / Treatments   Labs (all labs ordered are listed, but only abnormal results are displayed) Labs Reviewed - No data to display  EKG None  Radiology DG Chest [facility] 1 View  Result Date: 08/28/2022 CLINICAL DATA:  Chest pain EXAM: PORTABLE CHEST 1 VIEW COMPARISON:  CXR 05/24/20 FINDINGS: Needle accessed right chest port with the tip in the right atrium. Redemonstrated metallic densities in the left upper quadrant/at the left lung base. No pleural effusion. No pneumothorax. Redemonstrated calcified mediastinal and hilar lymphadenopathy. There is also a calcified granuloma in the right lung base. Likely left basilar atelectasis. No radiographically apparent displaced rib fractures. Visualized upper abdomen is unremarkable. IMPRESSION: 1. Likely left basilar atelectasis. Otherwise, no acute cardiopulmonary process. 2. Redemonstrated calcified mediastinal and hilar lymphadenopathy. Electronically Signed   By: Lorenza Cambridge M.D.   On: 08/28/2022 09:18    Procedures Procedures     Medications Ordered in ED Medications  heparin lock flush 100 unit/mL (has no administration in time range)    ED Course/ Medical Decision Making/ A&P                             Medical Decision Making Risk Prescription drug management.   Franciscojavier Wronski is here to have his Port-A-Cath site deaccessed.  He went to oncology office today for labs, he was was to have a infusion but never got done and accidentally left without having port be accessed.  Triage vitals were normal, blood pressure was a mistake as patient had his blood pressure down through coat with  a blood pressure cuff that was not the appropriate size.  I checked the blood pressure again myself with an appropriate sized cuff and blood pressure was 102/86.  He has no symptoms.  Has been doing well.  Port was de accessed by nursing staff and patient was discharged in the ED in good condition.  This chart was dictated using voice recognition software.  Despite best efforts to proofread,  errors can occur which can change the documentation meaning.         Final Clinical Impression(s) / ED Diagnoses Final diagnoses:  Problem with vascular access    Rx / DC Orders ED Discharge Orders     None         Virgina Norfolk, DO 08/29/22 2323

## 2022-08-30 LAB — T4: T4, Total: 5.8 ug/dL (ref 4.5–12.0)

## 2022-09-01 ENCOUNTER — Other Ambulatory Visit: Payer: Self-pay | Admitting: Hematology

## 2022-09-01 ENCOUNTER — Telehealth: Payer: Self-pay | Admitting: Hematology

## 2022-09-01 ENCOUNTER — Inpatient Hospital Stay: Payer: No Typology Code available for payment source

## 2022-09-01 VITALS — BP 140/87 | HR 98 | Temp 97.9°F | Resp 18 | Wt 135.1 lb

## 2022-09-01 DIAGNOSIS — C220 Liver cell carcinoma: Secondary | ICD-10-CM | POA: Diagnosis not present

## 2022-09-01 MED ORDER — SODIUM CHLORIDE 0.9 % IV SOLN
1200.0000 mg | Freq: Once | INTRAVENOUS | Status: AC
  Administered 2022-09-01: 1200 mg via INTRAVENOUS
  Filled 2022-09-01: qty 20

## 2022-09-01 MED ORDER — SODIUM CHLORIDE 0.9% FLUSH
10.0000 mL | INTRAVENOUS | Status: DC | PRN
  Administered 2022-09-01: 10 mL

## 2022-09-01 MED ORDER — HEPARIN SOD (PORK) LOCK FLUSH 100 UNIT/ML IV SOLN
500.0000 [IU] | Freq: Once | INTRAVENOUS | Status: AC | PRN
  Administered 2022-09-01: 500 [IU]

## 2022-09-01 MED ORDER — SODIUM CHLORIDE 0.9 % IV SOLN
15.0000 mg/kg | Freq: Once | INTRAVENOUS | Status: AC
  Administered 2022-09-01: 900 mg via INTRAVENOUS
  Filled 2022-09-01: qty 4

## 2022-09-01 MED ORDER — SODIUM CHLORIDE 0.9 % IV SOLN: Freq: Once | INTRAVENOUS | Status: AC

## 2022-09-01 NOTE — Patient Instructions (Signed)
[place] [facility] AT [facility]  Discharge Instructions: Thank you for choosing [facility] to provide your oncology and hematology care.   If you have a lab appointment with the Cancer Center, please go directly to the Cancer Center and check in at the registration area.   Wear comfortable clothing and clothing appropriate for easy access to any Portacath or PICC line.   We strive to give you quality time with your provider. You may need to reschedule your appointment if you arrive late (15 or more minutes).  Arriving late affects you and other patients whose appointments are after yours.  Also, if you miss three or more appointments without notifying the office, you may be dismissed from the clinic at the provider's discretion.      For prescription refill requests, have your pharmacy contact our office and allow 72 hours for refills to be completed.    Today you received the following chemotherapy and/or immunotherapy agents: Tecentriq, Mvasi.       To help prevent nausea and vomiting after your treatment, we encourage you to take your nausea medication as directed.  BELOW ARE SYMPTOMS THAT SHOULD BE REPORTED IMMEDIATELY: *FEVER GREATER THAN 100.4 F (38 C) OR HIGHER *CHILLS OR SWEATING *NAUSEA AND VOMITING THAT IS NOT CONTROLLED WITH YOUR NAUSEA MEDICATION *UNUSUAL SHORTNESS OF BREATH *UNUSUAL BRUISING OR BLEEDING *URINARY PROBLEMS (pain or burning when urinating, or frequent urination) *BOWEL PROBLEMS (unusual diarrhea, constipation, pain near the anus) TENDERNESS IN MOUTH AND THROAT WITH OR WITHOUT PRESENCE OF ULCERS (sore throat, sores in mouth, or a toothache) UNUSUAL RASH, SWELLING OR PAIN  UNUSUAL VAGINAL DISCHARGE OR ITCHING   Items with * indicate a potential emergency and should be followed up as soon as possible or go to the Emergency Department if any problems should occur.  Please show the CHEMOTHERAPY ALERT CARD or IMMUNOTHERAPY ALERT CARD  at check-in to the Emergency Department and triage nurse.  Should you have questions after your visit or need to cancel or reschedule your appointment, please contact [place] [facility]  Dept: [phone]  and follow the prompts.  Office hours are 8:00 a.m. to 4:30 p.m. Monday - Friday. Please note that voicemails left after 4:00 p.m. may not be returned until the following business day.  We are closed weekends and major holidays. You have access to a nurse at all times for urgent questions. Please call the main number to the clinic Dept: [phone] and follow the prompts.   For any non-urgent questions, you may also contact your provider using MyChart. We now offer e-Visits for anyone 3 and older to request care online for non-urgent symptoms. For details visit [email or web address].   Also download the MyChart app! Go to the app store, search "MyChart", open the app, select [place], and log in with your MyChart username and password.

## 2022-09-01 NOTE — Telephone Encounter (Signed)
Contacted patient to scheduled appointments. Patient is aware of appointments that are scheduled.   

## 2022-09-02 ENCOUNTER — Other Ambulatory Visit: Payer: Self-pay

## 2022-09-03 NOTE — Assessment & Plan Note (Signed)
-  His anemia has gotten worse lately, no overt GI bleeding, but his stool OB was positive in ED on 4/19 -We have stopped his Eliquis -Repeated CBC stable, will follow-up closely. -I recommend repeating EGD

## 2022-09-03 NOTE — Assessment & Plan Note (Addendum)
cT3N1M0, stage IVA, G3 -history of cirrhosis since ~2011 and hepatitis C diagnosed and treated in 2018 -diagnosed by liver biopsy on 11/04/21, baseline AFP normal  -s/p palliative radiation under Dr. Mitzi Hansen, 7/20-12/17/21 -he began Tecentriq and bevacizumab on 12/06/21. Tolerating well overall. 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 Milford Cage. -Restaging CT scan from April 21, 2022 showed excellent partial response to treatment, reviewed with patient and his wife today. -treatment held in Dec 2023 due to hospital admission for N/V and poor oral intake. Restarted on 05/14/2022 -Restaging CT abdomen pelvis from August 25, 2022 showed mixed response to treatment.  Per reading radiologist, his liver lesion has improved, portacaval adenopathy has increased, but the changes are subtle to me. Given mixed response, and limited other treatment options, I recommend him to continue current therapy -given significant portal hypertension and varices, I encouraged him to repeat echo since he is on bevacizumab.  His last EGD in June 2023 showed grade 1 varices.

## 2022-09-03 NOTE — Assessment & Plan Note (Signed)
-  history of cirrhosis since ~2011 and hepatitis C diagnosed and treated in 2018 per pt  -f/u with GI  -I encouraged him to have a repeated EGD to evaluate for varices, given his recent drop of H&H, I will reach out to his GI

## 2022-09-04 ENCOUNTER — Other Ambulatory Visit: Payer: Self-pay

## 2022-09-04 ENCOUNTER — Telehealth: Payer: Self-pay

## 2022-09-04 ENCOUNTER — Inpatient Hospital Stay: Payer: No Typology Code available for payment source

## 2022-09-04 ENCOUNTER — Inpatient Hospital Stay (HOSPITAL_BASED_OUTPATIENT_CLINIC_OR_DEPARTMENT_OTHER): Payer: No Typology Code available for payment source | Admitting: Hematology

## 2022-09-04 ENCOUNTER — Encounter: Payer: Self-pay | Admitting: Hematology

## 2022-09-04 VITALS — BP 137/76 | HR 91 | Temp 97.5°F | Resp 18 | Ht 71.0 in | Wt 137.5 lb

## 2022-09-04 DIAGNOSIS — Z515 Encounter for palliative care: Secondary | ICD-10-CM

## 2022-09-04 DIAGNOSIS — B182 Chronic viral hepatitis C: Secondary | ICD-10-CM

## 2022-09-04 DIAGNOSIS — D62 Acute posthemorrhagic anemia: Secondary | ICD-10-CM | POA: Diagnosis not present

## 2022-09-04 DIAGNOSIS — I8510 Secondary esophageal varices without bleeding: Secondary | ICD-10-CM

## 2022-09-04 DIAGNOSIS — C220 Liver cell carcinoma: Secondary | ICD-10-CM | POA: Diagnosis not present

## 2022-09-04 DIAGNOSIS — K7460 Unspecified cirrhosis of liver: Secondary | ICD-10-CM

## 2022-09-04 DIAGNOSIS — E860 Dehydration: Secondary | ICD-10-CM

## 2022-09-04 DIAGNOSIS — G893 Neoplasm related pain (acute) (chronic): Secondary | ICD-10-CM | POA: Diagnosis not present

## 2022-09-04 LAB — CMP (CANCER CENTER ONLY)
ALT: 36 U/L (ref 0–44)
AST: 78 U/L — ABNORMAL HIGH (ref 15–41)
Albumin: 3.4 g/dL — ABNORMAL LOW (ref 3.5–5.0)
Alkaline Phosphatase: 169 U/L — ABNORMAL HIGH (ref 38–126)
Anion gap: 9 (ref 5–15)
BUN: 16 mg/dL (ref 8–23)
CO2: 23 mmol/L (ref 22–32)
Calcium: 9.3 mg/dL (ref 8.9–10.3)
Chloride: 108 mmol/L (ref 98–111)
Creatinine: 1.01 mg/dL (ref 0.61–1.24)
GFR, Estimated: >60 mL/min (ref >60)
Glucose, Bld: 124 mg/dL — ABNORMAL HIGH (ref 70–99)
Potassium: 4.1 mmol/L (ref 3.5–5.1)
Sodium: 140 mmol/L (ref 135–145)
Total Bilirubin: 0.6 mg/dL (ref 0.3–1.2)
Total Protein: 7.4 g/dL (ref 6.5–8.1)

## 2022-09-04 LAB — CBC WITH DIFFERENTIAL (CANCER CENTER ONLY)
Abs Immature Granulocytes: 0.01 10*3/uL (ref 0.00–0.07)
Basophils Absolute: 0 10*3/uL (ref 0.0–0.1)
Basophils Relative: 1 %
Eosinophils Absolute: 0.1 10*3/uL (ref 0.0–0.5)
Eosinophils Relative: 2 %
HCT: 29.5 % — ABNORMAL LOW (ref 39.0–52.0)
Hemoglobin: 9.5 g/dL — ABNORMAL LOW (ref 13.0–17.0)
Immature Granulocytes: 0 %
Lymphocytes Relative: 18 %
Lymphs Abs: 0.6 10*3/uL — ABNORMAL LOW (ref 0.7–4.0)
MCH: 32.9 pg (ref 26.0–34.0)
MCHC: 32.2 g/dL (ref 30.0–36.0)
MCV: 102.1 fL — ABNORMAL HIGH (ref 80.0–100.0)
Monocytes Absolute: 0.3 10*3/uL (ref 0.1–1.0)
Monocytes Relative: 10 %
Neutro Abs: 2.2 10*3/uL (ref 1.7–7.7)
Neutrophils Relative %: 69 %
Platelet Count: 106 10*3/uL — ABNORMAL LOW (ref 150–400)
RBC: 2.89 MIL/uL — ABNORMAL LOW (ref 4.22–5.81)
RDW: 16.9 % — ABNORMAL HIGH (ref 11.5–15.5)
WBC Count: 3.2 10*3/uL — ABNORMAL LOW (ref 4.0–10.5)
nRBC: 0 % (ref 0.0–0.2)

## 2022-09-04 MED ORDER — SODIUM CHLORIDE 0.9% FLUSH
10.0000 mL | Freq: Once | INTRAVENOUS | Status: AC
  Administered 2022-09-04: 10 mL

## 2022-09-04 MED ORDER — HEPARIN SOD (PORK) LOCK FLUSH 100 UNIT/ML IV SOLN
500.0000 [IU] | Freq: Once | INTRAVENOUS | Status: AC
  Administered 2022-09-04: 500 [IU]

## 2022-09-04 MED ORDER — MORPHINE SULFATE ER 15 MG PO TBCR
15.0000 mg | EXTENDED_RELEASE_TABLET | Freq: Two times a day (BID) | ORAL | 0 refills | Status: DC
Start: 2022-09-04 — End: 2022-10-13

## 2022-09-04 NOTE — Telephone Encounter (Signed)
Called patient to schedule EGD with Dr. Barron Alvine at [facility].  With his permission, called and scheduled EGD with wife Teshaun Olarte on 09/10/22 at 9:45 AM at [facility].  Went over procedure instruction with her over the phone, and sent via MyChart and mailed at patient's home as well. Informed [name] that hospital endo staff will be calling prior to his procedure in regards to his medication instruction.   Patient is not on Eliquis per Dr. Barron Alvine, and Dr. Terrace Arabia - see note below. [name] confirmed as well that patient is not taking Eliquis.Clearance to hold Eliquis is not requested. Advised [name] to call us if she has any questions. [name] voiced understanding.

## 2022-09-04 NOTE — Telephone Encounter (Signed)
-----   Message from Missy Sabins, RN sent at 09/04/2022  3:03 PM EDT ----- Regarding: RE: EGD at [facility] 5/1 Thank you Kallan Merrick! ----- Message ----- From: Coletta Memos, CMA Sent: 09/04/2022   2:46 PM EDT To: Shellia Cleverly, DO; Missy Sabins, RN Subject: EGD at [facility] 5/1                                  [facility], I scheduled this pt. At [facility] on 5/1 for EGD with variceal banding. Dr. Barron Alvine is aware of it! Thank you! ----- Message ----- From: Shellia Cleverly, DO Sent: 09/04/2022  12:39 PM EDT To: Missy Sabins, RN; Coletta Memos, CMA  Any chance we can get this patient on with me next week at [facility] while I am inpatient for EGD with possible variceal banding? If not, what is the next open spot with me or anyone at [facility] or [facility] outpatient block?   ----- Message ----- From: [name], MD Sent: 09/04/2022   9:41 AM EDT To: Shellia Cleverly, DO  Vito,  He had some drop of H/H lately and stool OB was positive. I held is eliquis. He has significant varices on CT, could you repeat EKG? He is on Bevacizumab for his cancer which can cause bleeding also.  Thanks   Terrace Arabia
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
## 2022-09-05 ENCOUNTER — Encounter: Payer: Self-pay | Admitting: Hematology

## 2022-09-09 NOTE — Anesthesia Preprocedure Evaluation (Signed)
Anesthesia Evaluation  Patient identified by MRN, date of birth, ID band Patient awake    Reviewed: Allergy & Precautions, NPO status , Patient's Chart, lab work & pertinent test results  Airway Mallampati: II  TM Distance: >3 FB Neck ROM: Full    Dental  (+) Teeth Intact, Dental Advisory Given   Pulmonary former smoker   Pulmonary exam normal breath sounds clear to auscultation       Cardiovascular hypertension, Pt. on medications Normal cardiovascular exam Rhythm:Regular Rate:Normal  Echo 04/2022  1. Left ventricular ejection fraction, by estimation, is 60 to 65%. The  left ventricle has normal function. The left ventricle has no regional  wall motion abnormalities. Left ventricular diastolic parameters are  consistent with Grade I diastolic  dysfunction (impaired relaxation).   2. Right ventricular systolic function is normal. The right ventricular  size is normal. There is normal pulmonary artery systolic pressure. The  estimated right ventricular systolic pressure is 14.6 mmHg.   3. The mitral valve is normal in structure. No evidence of mitral valve  regurgitation. No evidence of mitral stenosis.   4. The aortic valve is tricuspid. There is mild calcification of the  aortic valve. Aortic valve regurgitation is not visualized. No aortic  stenosis is present.   5. The inferior vena cava is normal in size with greater than 50%  respiratory variability, suggesting right atrial pressure of 3 mmHg.   6. Technically difficult study with poor acoustic windows.      Neuro/Psych negative neurological ROS  negative psych ROS   GI/Hepatic ,GERD  Medicated and Controlled,,(+) Cirrhosis   Esophageal Varices  substance abuse  cocaine use, Hepatitis -Hepatocellular carcinoma (HCC) cT3N1M0, stage IVA, G3 -history of cirrhosis since ~2011 and hepatitis C diagnosed and treated in 2018    Endo/Other  diabetes, Well Controlled,  Type 2, Oral Hypoglycemic Agents, Insulin Dependent    Renal/GU negative Renal ROS  negative genitourinary   Musculoskeletal negative musculoskeletal ROS (+)    Abdominal   Peds  Hematology  (+) Blood dyscrasia, anemia Hb 9.5, plt 106   Anesthesia Other Findings   Reproductive/Obstetrics negative OB ROS                             Anesthesia Physical Anesthesia Plan  ASA: 3  Anesthesia Plan: MAC   Post-op Pain Management:    Induction:   PONV Risk Score and Plan: 2 and Propofol infusion and TIVA  Airway Management Planned: Natural Airway and Simple Face Mask  Additional Equipment: None  Intra-op Plan:   Post-operative Plan:   Informed Consent: I have reviewed the patients History and Physical, chart, labs and discussed the procedure including the risks, benefits and alternatives for the proposed anesthesia with the patient or authorized representative who has indicated his/her understanding and acceptance.     Dental advisory given  Plan Discussed with: CRNA  Anesthesia Plan Comments:        Anesthesia Quick Evaluation

## 2022-09-10 ENCOUNTER — Ambulatory Visit (HOSPITAL_BASED_OUTPATIENT_CLINIC_OR_DEPARTMENT_OTHER): Payer: Medicare Other | Admitting: Anesthesiology

## 2022-09-10 ENCOUNTER — Ambulatory Visit (HOSPITAL_COMMUNITY): Payer: Medicare Other | Admitting: Anesthesiology

## 2022-09-10 ENCOUNTER — Ambulatory Visit (HOSPITAL_COMMUNITY)
Admission: RE | Admit: 2022-09-10 | Discharge: 2022-09-10 | Disposition: A | Discharge status: Home / Self Care | Payer: Medicare Other | Attending: Gastroenterology | Admitting: Gastroenterology

## 2022-09-10 ENCOUNTER — Encounter (HOSPITAL_COMMUNITY): Payer: Self-pay | Admitting: Gastroenterology

## 2022-09-10 ENCOUNTER — Other Ambulatory Visit: Payer: Self-pay

## 2022-09-10 ENCOUNTER — Encounter (HOSPITAL_COMMUNITY)
Admission: RE | Disposition: A | Discharge status: Home / Self Care | Payer: Self-pay | Source: Home / Self Care | Attending: Gastroenterology

## 2022-09-10 DIAGNOSIS — I10 Essential (primary) hypertension: Secondary | ICD-10-CM | POA: Insufficient documentation

## 2022-09-10 DIAGNOSIS — Z8249 Family history of ischemic heart disease and other diseases of the circulatory system: Secondary | ICD-10-CM | POA: Insufficient documentation

## 2022-09-10 DIAGNOSIS — Z87891 Personal history of nicotine dependence: Secondary | ICD-10-CM | POA: Insufficient documentation

## 2022-09-10 DIAGNOSIS — Z7984 Long term (current) use of oral hypoglycemic drugs: Secondary | ICD-10-CM | POA: Diagnosis not present

## 2022-09-10 DIAGNOSIS — K766 Portal hypertension: Secondary | ICD-10-CM | POA: Insufficient documentation

## 2022-09-10 DIAGNOSIS — K3189 Other diseases of stomach and duodenum: Secondary | ICD-10-CM | POA: Insufficient documentation

## 2022-09-10 DIAGNOSIS — K7460 Unspecified cirrhosis of liver: Secondary | ICD-10-CM

## 2022-09-10 DIAGNOSIS — D62 Acute posthemorrhagic anemia: Secondary | ICD-10-CM

## 2022-09-10 DIAGNOSIS — I8510 Secondary esophageal varices without bleeding: Secondary | ICD-10-CM | POA: Insufficient documentation

## 2022-09-10 DIAGNOSIS — K921 Melena: Secondary | ICD-10-CM

## 2022-09-10 DIAGNOSIS — E119 Type 2 diabetes mellitus without complications: Secondary | ICD-10-CM | POA: Diagnosis not present

## 2022-09-10 DIAGNOSIS — K31819 Angiodysplasia of stomach and duodenum without bleeding: Secondary | ICD-10-CM

## 2022-09-10 DIAGNOSIS — Z794 Long term (current) use of insulin: Secondary | ICD-10-CM | POA: Diagnosis not present

## 2022-09-10 DIAGNOSIS — K31811 Angiodysplasia of stomach and duodenum with bleeding: Secondary | ICD-10-CM | POA: Insufficient documentation

## 2022-09-10 HISTORY — PX: BIOPSY: SHX5522

## 2022-09-10 HISTORY — PX: HOT HEMOSTASIS: SHX5433

## 2022-09-10 HISTORY — PX: ESOPHAGOGASTRODUODENOSCOPY (EGD) WITH PROPOFOL: SHX5813

## 2022-09-10 LAB — GLUCOSE, CAPILLARY
Glucose-Capillary: 101 mg/dL — ABNORMAL HIGH (ref 70–99)
Glucose-Capillary: 87 mg/dL (ref 70–99)

## 2022-09-10 SURGERY — ESOPHAGOGASTRODUODENOSCOPY (EGD) WITH PROPOFOL
Anesthesia: Monitor Anesthesia Care

## 2022-09-10 MED ORDER — ONDANSETRON HCL 4 MG/2ML IJ SOLN
INTRAMUSCULAR | Status: AC
  Filled 2022-09-10: qty 2

## 2022-09-10 MED ORDER — ONDANSETRON HCL 4 MG/2ML IJ SOLN
4.0000 mg | Freq: Once | INTRAMUSCULAR | Status: AC
  Administered 2022-09-10: 4 mg via INTRAVENOUS

## 2022-09-10 MED ORDER — PROPOFOL 10 MG/ML IV BOLUS
INTRAVENOUS | Status: DC | PRN
  Administered 2022-09-10: 40 mg via INTRAVENOUS

## 2022-09-10 MED ORDER — SODIUM CHLORIDE 0.9 % IV SOLN: INTRAVENOUS | Status: DC

## 2022-09-10 MED ORDER — LACTATED RINGERS IV SOLN: Freq: Once | INTRAVENOUS | Status: AC

## 2022-09-10 MED ORDER — LIDOCAINE 2% (20 MG/ML) 5 ML SYRINGE
INTRAMUSCULAR | Status: DC | PRN
  Administered 2022-09-10: 40 mg via INTRAVENOUS

## 2022-09-10 MED ORDER — PROPOFOL 500 MG/50ML IV EMUL
INTRAVENOUS | Status: DC | PRN
  Administered 2022-09-10: 125 ug/kg/min via INTRAVENOUS

## 2022-09-10 MED ORDER — SUCRALFATE 1 G PO TABS: 1.0000 g | ORAL_TABLET | Freq: Two times a day (BID) | ORAL | 1 refills | Status: DC

## 2022-09-10 MED ORDER — CARVEDILOL 6.25 MG PO TABS: 6.2500 mg | ORAL_TABLET | Freq: Every day | ORAL | 1 refills | Status: DC

## 2022-09-10 SURGICAL SUPPLY — 15 items
BLOCK BITE 60FR ADLT L/F BLUE (MISCELLANEOUS) ×2 IMPLANT
ELECT REM PT RETURN 9FT ADLT (ELECTROSURGICAL) IMPLANT
ELECTRODE REM PT RTRN 9FT ADLT (ELECTROSURGICAL) IMPLANT
FORCEP RJ3 GP 1.8X160 W-NEEDLE (CUTTING FORCEPS) IMPLANT
FORCEPS BIOP RAD 4 LRG CAP 4 (CUTTING FORCEPS) IMPLANT
NDL SCLEROTHERAPY 25GX240 (NEEDLE) IMPLANT
NEEDLE SCLEROTHERAPY 25GX240 (NEEDLE) IMPLANT
PROBE APC STR FIRE (PROBE) IMPLANT
PROBE INJECTION GOLD (MISCELLANEOUS)
PROBE INJECTION GOLD 7FR (MISCELLANEOUS) IMPLANT
SNARE SHORT THROW 13M SML OVAL (MISCELLANEOUS) IMPLANT
SYR 50ML LL SCALE MARK (SYRINGE) IMPLANT
TUBING ENDO SMARTCAP PENTAX (MISCELLANEOUS) ×4 IMPLANT
TUBING IRRIGATION ENDOGATOR (MISCELLANEOUS) ×2 IMPLANT
WATER STERILE IRR 1000ML POUR (IV SOLUTION) IMPLANT

## 2022-09-10 NOTE — Anesthesia Postprocedure Evaluation (Signed)
Anesthesia Post Note  Patient: Troy [name]  Procedure(s) Performed: ESOPHAGOGASTRODUODENOSCOPY (EGD) WITH PROPOFOL HOT HEMOSTASIS (ARGON PLASMA COAGULATION/BICAP) BIOPSY     Patient location during evaluation: PACU Anesthesia Type: MAC Level of consciousness: awake and alert Pain management: pain level controlled Vital Signs Assessment: post-procedure vital signs reviewed and stable Respiratory status: spontaneous breathing, nonlabored ventilation and respiratory function stable Cardiovascular status: blood pressure returned to baseline and stable Postop Assessment: no apparent nausea or vomiting Anesthetic complications: no   No notable events documented.  Last Vitals:  Vitals:   09/10/22 0947 09/10/22 0959  BP: (!) 163/87 (!) 155/92  Pulse: 93 91  Resp: 10 13  SpO2: 100% 99%    Last Pain:  Vitals:   09/10/22 0959  PainSc: 0-No pain                 Lannie Fields
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
## 2022-09-10 NOTE — Interval H&P Note (Signed)
History and Physical Interval Note:  09/10/2022 8:52 AM  Troy [name]  has presented today for surgery, with the diagnosis of EGD with variceal banding.  The various methods of treatment have been discussed with the patient and family. After consideration of risks, benefits and other options for treatment, the patient has consented to  Procedure(s): ESOPHAGOGASTRODUODENOSCOPY (EGD) WITH PROPOFOL (N/A) ESOPHAGEAL VARICES BANDING (N/A) as a surgical intervention.  The patient's history has been reviewed, patient examined, no change in status, stable for surgery.  I have reviewed the patient's chart and labs.  Questions were answered to the patient's satisfaction.     Verlin Dike Elbert Polyakov

## 2022-09-10 NOTE — Op Note (Signed)
[facility] Patient Name: Troy [name] Procedure Date : 09/10/2022 MRN: [id number] Attending MD: Doristine Locks , MD, [id number] Date of Birth: 1955/12/02 CSN: [id number] Age: 67 Admit Type: Outpatient Procedure:                Upper GI endoscopy Indications:              Acute post hemorrhagic anemia, Heme positive stool,                            Melena, Follow-up of esophageal varices                           History of HCV cirrhosis with [facility], now s/p                            palliative radiation (7/20 - 12/17/2021), then began                            Tecentriq and bevacizumab on 12/06/21, s/p TACE                            procedure 01/23/22. Recent restaging CT on 08/25/2022                            demonstrates mixed response to therapy, but also                            notable for PV thrombus with cavernous                            transformation, portal hypertensive changes with                            large gastroesophageal varices, portal colopathy in                            the cecum.                           He went to the ER on 08/29/2022 and noted to have                            FOBT positive stool, downtrending H/H 8.7/27 (from                            baseline Hgb 11?"12). Recent ferritin 16 with                            otherwise normal iron indices and normal B12/folate. Providers:                Doristine Locks, MD, Fransisca Connors, Melany Guernsey, Technician Referring MD:  [name] Medicines:                Monitored Anesthesia Care Complications:            No immediate complications. Estimated Blood Loss:     Estimated blood loss was minimal. Procedure:                Pre-Anesthesia Assessment:                           - Prior to the procedure, a History and Physical                            was performed, and patient medications and                            allergies were  reviewed. The patient's tolerance of                            previous anesthesia was also reviewed. The risks                            and benefits of the procedure and the sedation                            options and risks were discussed with the patient.                            All questions were answered, and informed consent                            was obtained. Prior Anticoagulants: The patient has                            taken Eliquis (apixaban), last dose was 10 days                            prior to procedure. ASA Grade Assessment: III - A                            patient with severe systemic disease. After                            reviewing the risks and benefits, the patient was                            deemed in satisfactory condition to undergo the                            procedure.                           After obtaining informed consent, the endoscope was  passed under direct vision. Throughout the                            procedure, the patient's blood pressure, pulse, and                            oxygen saturations were monitored continuously. The                            [id number]-H190 ([id number]) Olympus endoscope was introduced                            through the mouth, and advanced to the second part                            of duodenum. The upper GI endoscopy was                            accomplished without difficulty. The patient                            tolerated the procedure well. Scope In: Scope Out: Findings:      Grade II varices were found in the middle third of the esophagus and in       the lower third of the esophagus. They were 5 mm in largest diameter. No       stigmata of recent bleeding.      Mild to moderate portal hypertensive gastropathy was found in the       cardia, in the gastric fundus, in the gastric body and in the gastric       antrum. Biopsies were taken with a cold forceps for  histology. Estimated       blood loss was minimal.      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 Coagulation for hemostasis using argon plasma was       successful. Estimated blood loss was minimal.      The examined duodenum was normal. Impression:               - Grade II esophageal varices.                           - Portal hypertensive gastropathy. Biopsied.                           - Gastric antral vascular ectasia. Treated with                            argon plasma coagulation (APC).                           - Normal examined duodenum.                           - Based on endoscopic appearance and clinical  history, suspect mild intermittent oozing from the                            stomach rather than variceal bleeding, so I elected                            to proceed with APC of the overlapping GAVE. Recommendation:           - Patient has a contact number available for                            emergencies. The signs and symptoms of potential                            delayed complications were discussed with the                            patient. Return to normal activities tomorrow.                            Written discharge instructions were provided to the                            patient.                           - Advance diet as tolerated today.                           - Await pathology results.                           - Repeat upper endoscopy in 1-2 years for                            surveillance, or sooner if concern for rebleeding.                           - Use Protonix (pantoprazole) 40 mg PO BID for 6                            weeks.                           - Use sucralfate tablets 1 gram PO BID for 4 weeks.                           - Start carvedilol (Coreg) 6.25 mg daily. Check HR                            and BP in 1 week, and if tolerating without  issue,                            will increase to 12.5 mg daily with goal HR 55-60.                           -  Follow-up with Dr. [name] in the                            Hematology-Oncology Clinic as scheduled.                           - Will discuss with Dr. [name] about restarting                            anticoagulation in a couple days with close                            serologic observation.                           - Check CBC in 1 week.                           - Return to GI clinic at appointment to be                            scheduled. Procedure Code(s):        --- Professional ---                           [phone], 59, Esophagogastroduodenoscopy, flexible,                            transoral; with control of bleeding, any method                           43239, Esophagogastroduodenoscopy, flexible,                            transoral; with biopsy, single or multiple Diagnosis Code(s):        --- Professional ---                           I85.00, Esophageal varices without bleeding                           K76.6, Portal hypertension                           K31.89, Other diseases of stomach and duodenum                           K31.819, Angiodysplasia of stomach and duodenum                            without bleeding                           D62, Acute posthemorrhagic anemia                           R19.5, Other fecal abnormalities  K92.1, Melena (includes Hematochezia) CPT copyright 2022 American [employer] Association. All rights reserved. The codes documented in this report are preliminary and upon coder review may  be revised to meet current compliance requirements. Doristine Locks, MD 09/10/2022 9:42:02 AM Number of Addenda: 0

## 2022-09-10 NOTE — Anesthesia Procedure Notes (Signed)
Procedure Name: MAC Date/Time: 09/10/2022 9:03 AM  Performed by: Randon Goldsmith, CRNAPre-anesthesia Checklist: Patient identified, Emergency Drugs available, Suction available and Patient being monitored Patient Re-evaluated:Patient Re-evaluated prior to induction Oxygen Delivery Method: Nasal cannula Preoxygenation: Pre-oxygenation with 100% oxygen Induction Type: IV induction Dental Injury: Teeth and Oropharynx as per pre-operative assessment

## 2022-09-10 NOTE — Transfer of Care (Signed)
Immediate Anesthesia Transfer of Care Note  Patient: Troy [name]  Procedure(s) Performed: ESOPHAGOGASTRODUODENOSCOPY (EGD) WITH PROPOFOL HOT HEMOSTASIS (ARGON PLASMA COAGULATION/BICAP) BIOPSY  Patient Location: Endoscopy Unit  Anesthesia Type:MAC  Level of Consciousness: drowsy and patient cooperative  Airway & Oxygen Therapy: Patient Spontanous Breathing and Patient connected to nasal cannula oxygen  Post-op Assessment: Report given to RN and Post -op Vital signs reviewed and stable  Post vital signs: Reviewed and stable  Last Vitals:  Vitals Value Taken Time  BP 129/78   Temp    Pulse 92 09/10/22 0924  Resp 18   SpO2 100 % 09/10/22 0924  Vitals shown include unvalidated device data.  Last Pain:  Vitals:   09/10/22 0821  PainSc: 0-No pain         Complications: No notable events documented.

## 2022-09-11 ENCOUNTER — Other Ambulatory Visit: Payer: Self-pay

## 2022-09-11 ENCOUNTER — Telehealth: Payer: Self-pay | Admitting: Gastroenterology

## 2022-09-11 LAB — SURGICAL PATHOLOGY

## 2022-09-11 MED ORDER — SUCRALFATE 1 G PO TABS: 1.0000 g | ORAL_TABLET | Freq: Two times a day (BID) | ORAL | 1 refills | Status: DC

## 2022-09-11 NOTE — Telephone Encounter (Signed)
Carafate sent to [facility] . Patient's wife made aware.

## 2022-09-11 NOTE — Telephone Encounter (Signed)
PT wife is calling about the carafate. The [facility] is out of stock for the medication. She wants to know of places where it was cost efficient otherwise it should be sent to [facility]. Requesting call back

## 2022-09-15 ENCOUNTER — Encounter (HOSPITAL_COMMUNITY): Payer: Self-pay | Admitting: Gastroenterology
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
## 2022-09-17 ENCOUNTER — Encounter: Payer: No Typology Code available for payment source | Admitting: Dietician

## 2022-09-17 ENCOUNTER — Ambulatory Visit: Payer: No Typology Code available for payment source

## 2022-09-17 ENCOUNTER — Ambulatory Visit: Payer: No Typology Code available for payment source | Admitting: Hematology

## 2022-09-17 ENCOUNTER — Other Ambulatory Visit: Payer: No Typology Code available for payment source

## 2022-09-18 ENCOUNTER — Encounter: Payer: Self-pay | Admitting: Hematology
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
## 2022-09-22 ENCOUNTER — Inpatient Hospital Stay (HOSPITAL_BASED_OUTPATIENT_CLINIC_OR_DEPARTMENT_OTHER): Payer: No Typology Code available for payment source | Admitting: Nurse Practitioner

## 2022-09-22 ENCOUNTER — Inpatient Hospital Stay: Payer: No Typology Code available for payment source | Attending: Hematology

## 2022-09-22 ENCOUNTER — Other Ambulatory Visit: Payer: Self-pay

## 2022-09-22 ENCOUNTER — Inpatient Hospital Stay (HOSPITAL_BASED_OUTPATIENT_CLINIC_OR_DEPARTMENT_OTHER): Payer: No Typology Code available for payment source | Attending: Hematology | Admitting: Nurse Practitioner

## 2022-09-22 ENCOUNTER — Inpatient Hospital Stay: Payer: No Typology Code available for payment source | Admitting: Nutrition

## 2022-09-22 ENCOUNTER — Encounter: Payer: Self-pay | Admitting: Nurse Practitioner

## 2022-09-22 ENCOUNTER — Inpatient Hospital Stay: Payer: No Typology Code available for payment source

## 2022-09-22 DIAGNOSIS — Z79899 Other long term (current) drug therapy: Secondary | ICD-10-CM | POA: Insufficient documentation

## 2022-09-22 DIAGNOSIS — B1920 Unspecified viral hepatitis C without hepatic coma: Secondary | ICD-10-CM | POA: Insufficient documentation

## 2022-09-22 DIAGNOSIS — Z5112 Encounter for antineoplastic immunotherapy: Secondary | ICD-10-CM | POA: Diagnosis present

## 2022-09-22 DIAGNOSIS — C220 Liver cell carcinoma: Secondary | ICD-10-CM

## 2022-09-22 DIAGNOSIS — G893 Neoplasm related pain (acute) (chronic): Secondary | ICD-10-CM | POA: Diagnosis not present

## 2022-09-22 DIAGNOSIS — C787 Secondary malignant neoplasm of liver and intrahepatic bile duct: Secondary | ICD-10-CM | POA: Insufficient documentation

## 2022-09-22 DIAGNOSIS — E119 Type 2 diabetes mellitus without complications: Secondary | ICD-10-CM | POA: Insufficient documentation

## 2022-09-22 DIAGNOSIS — K31819 Angiodysplasia of stomach and duodenum without bleeding: Secondary | ICD-10-CM | POA: Diagnosis not present

## 2022-09-22 DIAGNOSIS — I868 Varicose veins of other specified sites: Secondary | ICD-10-CM | POA: Diagnosis not present

## 2022-09-22 DIAGNOSIS — D649 Anemia, unspecified: Secondary | ICD-10-CM | POA: Insufficient documentation

## 2022-09-22 DIAGNOSIS — R630 Anorexia: Secondary | ICD-10-CM

## 2022-09-22 DIAGNOSIS — K766 Portal hypertension: Secondary | ICD-10-CM | POA: Insufficient documentation

## 2022-09-22 DIAGNOSIS — R197 Diarrhea, unspecified: Secondary | ICD-10-CM | POA: Diagnosis not present

## 2022-09-22 DIAGNOSIS — K2100 Gastro-esophageal reflux disease with esophagitis, without bleeding: Secondary | ICD-10-CM | POA: Diagnosis not present

## 2022-09-22 DIAGNOSIS — K3189 Other diseases of stomach and duodenum: Secondary | ICD-10-CM | POA: Insufficient documentation

## 2022-09-22 DIAGNOSIS — Z515 Encounter for palliative care: Secondary | ICD-10-CM

## 2022-09-22 DIAGNOSIS — K828 Other specified diseases of gallbladder: Secondary | ICD-10-CM | POA: Insufficient documentation

## 2022-09-22 DIAGNOSIS — K5900 Constipation, unspecified: Secondary | ICD-10-CM | POA: Diagnosis not present

## 2022-09-22 DIAGNOSIS — Z8619 Personal history of other infectious and parasitic diseases: Secondary | ICD-10-CM | POA: Insufficient documentation

## 2022-09-22 DIAGNOSIS — Z8719 Personal history of other diseases of the digestive system: Secondary | ICD-10-CM | POA: Diagnosis not present

## 2022-09-22 DIAGNOSIS — E860 Dehydration: Secondary | ICD-10-CM

## 2022-09-22 DIAGNOSIS — I700 Atherosclerosis of aorta: Secondary | ICD-10-CM | POA: Diagnosis not present

## 2022-09-22 DIAGNOSIS — I81 Portal vein thrombosis: Secondary | ICD-10-CM | POA: Diagnosis not present

## 2022-09-22 DIAGNOSIS — K5903 Drug induced constipation: Secondary | ICD-10-CM

## 2022-09-22 DIAGNOSIS — Z7901 Long term (current) use of anticoagulants: Secondary | ICD-10-CM | POA: Diagnosis not present

## 2022-09-22 DIAGNOSIS — R634 Abnormal weight loss: Secondary | ICD-10-CM

## 2022-09-22 LAB — CBC WITH DIFFERENTIAL (CANCER CENTER ONLY)
Abs Immature Granulocytes: 0.01 10*3/uL (ref 0.00–0.07)
Basophils Absolute: 0 10*3/uL (ref 0.0–0.1)
Basophils Relative: 1 %
Eosinophils Absolute: 0.1 10*3/uL (ref 0.0–0.5)
Eosinophils Relative: 4 %
HCT: 32.3 % — ABNORMAL LOW (ref 39.0–52.0)
Hemoglobin: 10.7 g/dL — ABNORMAL LOW (ref 13.0–17.0)
Immature Granulocytes: 0 %
Lymphocytes Relative: 20 %
Lymphs Abs: 0.8 10*3/uL (ref 0.7–4.0)
MCH: 32.2 pg (ref 26.0–34.0)
MCHC: 33.1 g/dL (ref 30.0–36.0)
MCV: 97.3 fL (ref 80.0–100.0)
Monocytes Absolute: 0.6 10*3/uL (ref 0.1–1.0)
Monocytes Relative: 15 %
Neutro Abs: 2.3 10*3/uL (ref 1.7–7.7)
Neutrophils Relative %: 60 %
Platelet Count: 101 10*3/uL — ABNORMAL LOW (ref 150–400)
RBC: 3.32 MIL/uL — ABNORMAL LOW (ref 4.22–5.81)
RDW: 14.7 % (ref 11.5–15.5)
WBC Count: 3.9 10*3/uL — ABNORMAL LOW (ref 4.0–10.5)
nRBC: 0 % (ref 0.0–0.2)

## 2022-09-22 LAB — CMP (CANCER CENTER ONLY)
ALT: 38 U/L (ref 0–44)
AST: 91 U/L — ABNORMAL HIGH (ref 15–41)
Albumin: 3.5 g/dL (ref 3.5–5.0)
Alkaline Phosphatase: 165 U/L — ABNORMAL HIGH (ref 38–126)
Anion gap: 9 (ref 5–15)
BUN: 24 mg/dL — ABNORMAL HIGH (ref 8–23)
CO2: 23 mmol/L (ref 22–32)
Calcium: 9.4 mg/dL (ref 8.9–10.3)
Chloride: 111 mmol/L (ref 98–111)
Creatinine: 1.38 mg/dL — ABNORMAL HIGH (ref 0.61–1.24)
GFR, Estimated: 55 mL/min — ABNORMAL LOW (ref >60)
Glucose, Bld: 111 mg/dL — ABNORMAL HIGH (ref 70–99)
Potassium: 4.1 mmol/L (ref 3.5–5.1)
Sodium: 143 mmol/L (ref 135–145)
Total Bilirubin: 0.7 mg/dL (ref 0.3–1.2)
Total Protein: 7.5 g/dL (ref 6.5–8.1)

## 2022-09-22 LAB — TSH: TSH: 0.913 u[IU]/mL (ref 0.350–4.500)

## 2022-09-22 LAB — TOTAL PROTEIN, URINE DIPSTICK: Protein, ur: NEGATIVE mg/dL

## 2022-09-22 MED ORDER — SODIUM CHLORIDE 0.9 % IV SOLN: Freq: Once | INTRAVENOUS | Status: AC

## 2022-09-22 MED ORDER — SODIUM CHLORIDE 0.9 % IV SOLN
1200.0000 mg | Freq: Once | INTRAVENOUS | Status: AC
  Administered 2022-09-22: 1200 mg via INTRAVENOUS
  Filled 2022-09-22: qty 20

## 2022-09-22 MED ORDER — SODIUM CHLORIDE 0.9 % IV SOLN
15.0000 mg/kg | Freq: Once | INTRAVENOUS | Status: AC
  Administered 2022-09-22: 900 mg via INTRAVENOUS
  Filled 2022-09-22: qty 4

## 2022-09-22 MED ORDER — ONDANSETRON HCL 8 MG PO TABS
8.0000 mg | ORAL_TABLET | Freq: Three times a day (TID) | ORAL | 2 refills | Status: DC | PRN
Start: 2022-09-22 — End: 2023-08-05

## 2022-09-22 MED ORDER — SODIUM CHLORIDE 0.9% FLUSH
10.0000 mL | INTRAVENOUS | Status: DC | PRN
  Administered 2022-09-22: 10 mL

## 2022-09-22 MED ORDER — SODIUM CHLORIDE 0.9% FLUSH
10.0000 mL | Freq: Once | INTRAVENOUS | Status: AC
  Administered 2022-09-22: 10 mL

## 2022-09-22 MED ORDER — METHOCARBAMOL 500 MG PO TABS: 500.0000 mg | ORAL_TABLET | Freq: Two times a day (BID) | ORAL | 0 refills | Status: DC | PRN

## 2022-09-22 MED ORDER — HEPARIN SOD (PORK) LOCK FLUSH 100 UNIT/ML IV SOLN
500.0000 [IU] | Freq: Once | INTRAVENOUS | Status: AC | PRN
  Administered 2022-09-22: 500 [IU]

## 2022-09-22 MED ORDER — PROMETHAZINE HCL 12.5 MG PO TABS: 25.0000 mg | ORAL_TABLET | Freq: Four times a day (QID) | ORAL | 2 refills | Status: DC | PRN

## 2022-09-22 NOTE — Progress Notes (Signed)
Per Santiago Glad, PA ok to treat with AST 91

## 2022-09-22 NOTE — Progress Notes (Signed)
Nutrition follow-up completed with patient during infusion for HCC.  He is followed by Dr. [name].  Weight documented as 133 pounds on May 13.  This is decreased from 138 pounds documented March 6.  Noted labs: Glucose 111, BUN 24, creatinine 1.38.  Patient reports his weight is stable and his appetite is good.  States he does not remember weighing 138 pounds.  He reports nausea and vomiting is controlled with antiemetics.  States his bowel movements are normal for him.  He continues to drink oral nutrition supplements but is getting tired of the taste.  Reports he plans to continue these for the extra calories and protein.   He has no questions or concerns today.  Nutrition diagnosis: Unintended weight loss, ongoing.  Patient has lost 3% body weight over 2 months.  Intervention: Recommended patient continue oral nutrition supplements to provide additional calories and protein. Provided coupons for oral nutrition supplements.  Next visit: To be scheduled as needed.  **Disclaimer: This note was dictated with voice recognition software. Similar sounding words can inadvertently be transcribed and this note may contain transcription errors which may not have been corrected upon publication of note.**

## 2022-09-22 NOTE — Patient Instructions (Signed)
[facility]  Discharge Instructions: Thank you for choosing [facility] to provide your oncology and hematology care.   If you have a lab appointment with the Cancer Center, please go directly to the Cancer Center and check in at the registration area.   Wear comfortable clothing and clothing appropriate for easy access to any Portacath or PICC line.   We strive to give you quality time with your provider. You may need to reschedule your appointment if you arrive late (15 or more minutes).  Arriving late affects you and other patients whose appointments are after yours.  Also, if you miss three or more appointments without notifying the office, you may be dismissed from the clinic at the provider's discretion.      For prescription refill requests, have your pharmacy contact our office and allow 72 hours for refills to be completed.    Today you received the following chemotherapy and/or immunotherapy agents bevacizumab, atezolizumab      To help prevent nausea and vomiting after your treatment, we encourage you to take your nausea medication as directed.  BELOW ARE SYMPTOMS THAT SHOULD BE REPORTED IMMEDIATELY: *FEVER GREATER THAN 100.4 F (38 C) OR HIGHER *CHILLS OR SWEATING *NAUSEA AND VOMITING THAT IS NOT CONTROLLED WITH YOUR NAUSEA MEDICATION *UNUSUAL SHORTNESS OF BREATH *UNUSUAL BRUISING OR BLEEDING *URINARY PROBLEMS (pain or burning when urinating, or frequent urination) *BOWEL PROBLEMS (unusual diarrhea, constipation, pain near the anus) TENDERNESS IN MOUTH AND THROAT WITH OR WITHOUT PRESENCE OF ULCERS (sore throat, sores in mouth, or a toothache) UNUSUAL RASH, SWELLING OR PAIN  UNUSUAL VAGINAL DISCHARGE OR ITCHING   Items with * indicate a potential emergency and should be followed up as soon as possible or go to the Emergency Department if any problems should occur.  Please show the CHEMOTHERAPY ALERT CARD or IMMUNOTHERAPY  ALERT CARD at check-in to the Emergency Department and triage nurse.  Should you have questions after your visit or need to cancel or reschedule your appointment, please contact [facility]  Dept: [phone]  and follow the prompts.  Office hours are 8:00 a.m. to 4:30 p.m. Monday - Friday. Please note that voicemails left after 4:00 p.m. may not be returned until the following business day.  We are closed weekends and major holidays. You have access to a nurse at all times for urgent questions. Please call the main number to the clinic Dept: [phone] and follow the prompts.   For any non-urgent questions, you may also contact your provider using MyChart. We now offer e-Visits for anyone 30 and older to request care online for non-urgent symptoms. For details visit [email or web address].   Also download the MyChart app! Go to the app store, search "MyChart", open the app, select [place], and log in with your MyChart username and password.

## 2022-09-24 ENCOUNTER — Telehealth: Payer: Self-pay

## 2022-09-24 LAB — T4: T4, Total: 7.8 ug/dL (ref 4.5–12.0)

## 2022-09-24 NOTE — Telephone Encounter (Signed)
Notified Patient of completion of FMLA forms for Spouse as requested. Fax transmission confirmation received. No other needs or concerns voiced at this time.

## 2022-10-08 ENCOUNTER — Ambulatory Visit: Payer: No Typology Code available for payment source

## 2022-10-08 ENCOUNTER — Other Ambulatory Visit: Payer: No Typology Code available for payment source

## 2022-10-08 ENCOUNTER — Ambulatory Visit: Payer: No Typology Code available for payment source | Admitting: Hematology

## 2022-10-12 NOTE — Assessment & Plan Note (Signed)
cT3N1M0, stage IVA, G3 -history of cirrhosis since ~2011 and hepatitis C diagnosed and treated in 2018 -diagnosed by liver biopsy on 11/04/21, baseline AFP normal  -s/p palliative radiation under Dr. Mitzi Hansen, 7/20-12/17/21 -he began Tecentriq and bevacizumab on 12/06/21. Tolerating well overall. 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 Milford Cage. -Restaging CT scan from April 21, 2022 showed excellent partial response to treatment, reviewed with patient and his wife today. -treatment held in Dec 2023 due to hospital admission for N/V and poor oral intake. Restarted on 05/14/2022 -Restaging CT abdomen pelvis from August 25, 2022 showed mixed response to treatment.  Per reading radiologist, his liver lesion has improved, portacaval adenopathy has increased, but the changes are subtle to me. Given mixed response, and limited other treatment options, I recommend him to continue current therapy, will repeat scan in 2-3 months for close f/u

## 2022-10-12 NOTE — Assessment & Plan Note (Addendum)
history of cirrhosis since ~2011 and hepatitis C diagnosed and treated in 2018 per pt  -f/u with GI  -I encouraged him to have a repeated EGD to evaluate for varices, given his recent drop of H&H, I will reach out -repeated EGD on 09/10/2022 showed grade 2 varices, and gastric antral vascular ectasia which was treated with APC.

## 2022-10-13 ENCOUNTER — Encounter: Payer: Self-pay | Admitting: Nurse Practitioner

## 2022-10-13 ENCOUNTER — Inpatient Hospital Stay (HOSPITAL_BASED_OUTPATIENT_CLINIC_OR_DEPARTMENT_OTHER): Payer: No Typology Code available for payment source | Admitting: Hematology

## 2022-10-13 ENCOUNTER — Encounter: Payer: Self-pay | Admitting: Hematology

## 2022-10-13 ENCOUNTER — Other Ambulatory Visit: Payer: Self-pay

## 2022-10-13 ENCOUNTER — Inpatient Hospital Stay (HOSPITAL_BASED_OUTPATIENT_CLINIC_OR_DEPARTMENT_OTHER): Payer: No Typology Code available for payment source | Admitting: Nurse Practitioner

## 2022-10-13 ENCOUNTER — Inpatient Hospital Stay: Payer: No Typology Code available for payment source | Attending: Hematology

## 2022-10-13 ENCOUNTER — Inpatient Hospital Stay: Payer: No Typology Code available for payment source

## 2022-10-13 VITALS — BP 102/54 | HR 72 | Temp 97.8°F | Resp 15 | Ht 71.0 in | Wt 130.6 lb

## 2022-10-13 VITALS — BP 106/65 | HR 84 | Temp 97.6°F | Resp 16

## 2022-10-13 DIAGNOSIS — Z8711 Personal history of peptic ulcer disease: Secondary | ICD-10-CM | POA: Insufficient documentation

## 2022-10-13 DIAGNOSIS — R634 Abnormal weight loss: Secondary | ICD-10-CM | POA: Diagnosis not present

## 2022-10-13 DIAGNOSIS — Z79620 Long term (current) use of immunosuppressive biologic: Secondary | ICD-10-CM | POA: Diagnosis not present

## 2022-10-13 DIAGNOSIS — Z87891 Personal history of nicotine dependence: Secondary | ICD-10-CM | POA: Diagnosis not present

## 2022-10-13 DIAGNOSIS — I10 Essential (primary) hypertension: Secondary | ICD-10-CM | POA: Diagnosis not present

## 2022-10-13 DIAGNOSIS — E119 Type 2 diabetes mellitus without complications: Secondary | ICD-10-CM | POA: Insufficient documentation

## 2022-10-13 DIAGNOSIS — C220 Liver cell carcinoma: Secondary | ICD-10-CM

## 2022-10-13 DIAGNOSIS — K7460 Unspecified cirrhosis of liver: Secondary | ICD-10-CM | POA: Diagnosis not present

## 2022-10-13 DIAGNOSIS — B1920 Unspecified viral hepatitis C without hepatic coma: Secondary | ICD-10-CM | POA: Diagnosis not present

## 2022-10-13 DIAGNOSIS — I864 Gastric varices: Secondary | ICD-10-CM | POA: Diagnosis not present

## 2022-10-13 DIAGNOSIS — K31811 Angiodysplasia of stomach and duodenum with bleeding: Secondary | ICD-10-CM | POA: Insufficient documentation

## 2022-10-13 DIAGNOSIS — I868 Varicose veins of other specified sites: Secondary | ICD-10-CM | POA: Diagnosis not present

## 2022-10-13 DIAGNOSIS — I700 Atherosclerosis of aorta: Secondary | ICD-10-CM | POA: Diagnosis not present

## 2022-10-13 DIAGNOSIS — D62 Acute posthemorrhagic anemia: Secondary | ICD-10-CM | POA: Diagnosis not present

## 2022-10-13 DIAGNOSIS — B182 Chronic viral hepatitis C: Secondary | ICD-10-CM | POA: Diagnosis not present

## 2022-10-13 DIAGNOSIS — Z515 Encounter for palliative care: Secondary | ICD-10-CM | POA: Insufficient documentation

## 2022-10-13 DIAGNOSIS — G893 Neoplasm related pain (acute) (chronic): Secondary | ICD-10-CM | POA: Insufficient documentation

## 2022-10-13 DIAGNOSIS — Z820 Family history of epilepsy and other diseases of the nervous system: Secondary | ICD-10-CM | POA: Insufficient documentation

## 2022-10-13 DIAGNOSIS — K5900 Constipation, unspecified: Secondary | ICD-10-CM | POA: Insufficient documentation

## 2022-10-13 DIAGNOSIS — Z7901 Long term (current) use of anticoagulants: Secondary | ICD-10-CM | POA: Diagnosis not present

## 2022-10-13 DIAGNOSIS — I81 Portal vein thrombosis: Secondary | ICD-10-CM | POA: Diagnosis not present

## 2022-10-13 DIAGNOSIS — Z5112 Encounter for antineoplastic immunotherapy: Secondary | ICD-10-CM | POA: Insufficient documentation

## 2022-10-13 DIAGNOSIS — K828 Other specified diseases of gallbladder: Secondary | ICD-10-CM | POA: Diagnosis not present

## 2022-10-13 DIAGNOSIS — Z800 Family history of malignant neoplasm of digestive organs: Secondary | ICD-10-CM | POA: Insufficient documentation

## 2022-10-13 DIAGNOSIS — D649 Anemia, unspecified: Secondary | ICD-10-CM | POA: Diagnosis not present

## 2022-10-13 DIAGNOSIS — E860 Dehydration: Secondary | ICD-10-CM

## 2022-10-13 DIAGNOSIS — Z8249 Family history of ischemic heart disease and other diseases of the circulatory system: Secondary | ICD-10-CM | POA: Insufficient documentation

## 2022-10-13 DIAGNOSIS — K259 Gastric ulcer, unspecified as acute or chronic, without hemorrhage or perforation: Secondary | ICD-10-CM | POA: Diagnosis not present

## 2022-10-13 DIAGNOSIS — R530 Neoplastic (malignant) related fatigue: Secondary | ICD-10-CM

## 2022-10-13 DIAGNOSIS — K7469 Other cirrhosis of liver: Secondary | ICD-10-CM | POA: Diagnosis not present

## 2022-10-13 DIAGNOSIS — R188 Other ascites: Secondary | ICD-10-CM | POA: Insufficient documentation

## 2022-10-13 DIAGNOSIS — Z79899 Other long term (current) drug therapy: Secondary | ICD-10-CM | POA: Insufficient documentation

## 2022-10-13 DIAGNOSIS — R630 Anorexia: Secondary | ICD-10-CM

## 2022-10-13 LAB — CBC WITH DIFFERENTIAL (CANCER CENTER ONLY)
Abs Immature Granulocytes: 0.01 10*3/uL (ref 0.00–0.07)
Basophils Absolute: 0 10*3/uL (ref 0.0–0.1)
Basophils Relative: 1 %
Eosinophils Absolute: 0.1 10*3/uL (ref 0.0–0.5)
Eosinophils Relative: 4 %
HCT: 24.5 % — ABNORMAL LOW (ref 39.0–52.0)
Hemoglobin: 8.2 g/dL — ABNORMAL LOW (ref 13.0–17.0)
Immature Granulocytes: 0 %
Lymphocytes Relative: 23 %
Lymphs Abs: 0.8 10*3/uL (ref 0.7–4.0)
MCH: 31.2 pg (ref 26.0–34.0)
MCHC: 33.5 g/dL (ref 30.0–36.0)
MCV: 93.2 fL (ref 80.0–100.0)
Monocytes Absolute: 0.5 10*3/uL (ref 0.1–1.0)
Monocytes Relative: 14 %
Neutro Abs: 2 10*3/uL (ref 1.7–7.7)
Neutrophils Relative %: 58 %
Platelet Count: 120 10*3/uL — ABNORMAL LOW (ref 150–400)
RBC: 2.63 MIL/uL — ABNORMAL LOW (ref 4.22–5.81)
RDW: 14.6 % (ref 11.5–15.5)
WBC Count: 3.5 10*3/uL — ABNORMAL LOW (ref 4.0–10.5)
nRBC: 0 % (ref 0.0–0.2)

## 2022-10-13 LAB — CMP (CANCER CENTER ONLY)
ALT: 26 U/L (ref 0–44)
AST: 67 U/L — ABNORMAL HIGH (ref 15–41)
Albumin: 3.2 g/dL — ABNORMAL LOW (ref 3.5–5.0)
Alkaline Phosphatase: 123 U/L (ref 38–126)
Anion gap: 8 (ref 5–15)
BUN: 19 mg/dL (ref 8–23)
CO2: 24 mmol/L (ref 22–32)
Calcium: 9.3 mg/dL (ref 8.9–10.3)
Chloride: 107 mmol/L (ref 98–111)
Creatinine: 1.1 mg/dL (ref 0.61–1.24)
GFR, Estimated: >60 mL/min (ref >60)
Glucose, Bld: 67 mg/dL — ABNORMAL LOW (ref 70–99)
Potassium: 4 mmol/L (ref 3.5–5.1)
Sodium: 139 mmol/L (ref 135–145)
Total Bilirubin: 0.5 mg/dL (ref 0.3–1.2)
Total Protein: 6.7 g/dL (ref 6.5–8.1)

## 2022-10-13 LAB — FERRITIN: Ferritin: 25 ng/mL (ref 24–336)

## 2022-10-13 LAB — TSH: TSH: 1.703 u[IU]/mL (ref 0.350–4.500)

## 2022-10-13 LAB — TOTAL PROTEIN, URINE DIPSTICK: Protein, ur: NEGATIVE mg/dL

## 2022-10-13 MED ORDER — SODIUM CHLORIDE 0.9 % IV SOLN: Freq: Once | INTRAVENOUS | Status: AC

## 2022-10-13 MED ORDER — HEPARIN SOD (PORK) LOCK FLUSH 100 UNIT/ML IV SOLN
500.0000 [IU] | Freq: Once | INTRAVENOUS | Status: AC | PRN
  Administered 2022-10-13: 500 [IU]

## 2022-10-13 MED ORDER — SODIUM CHLORIDE 0.9% FLUSH
10.0000 mL | Freq: Once | INTRAVENOUS | Status: AC | PRN
  Administered 2022-10-13: 10 mL

## 2022-10-13 MED ORDER — SODIUM CHLORIDE 0.9% FLUSH
10.0000 mL | INTRAVENOUS | Status: DC | PRN
  Administered 2022-10-13: 10 mL

## 2022-10-13 MED ORDER — SODIUM CHLORIDE 0.9 % IV SOLN
1200.0000 mg | Freq: Once | INTRAVENOUS | Status: AC
  Administered 2022-10-13: 1200 mg via INTRAVENOUS
  Filled 2022-10-13: qty 20

## 2022-10-13 MED ORDER — MORPHINE SULFATE ER 15 MG PO TBCR
15.0000 mg | EXTENDED_RELEASE_TABLET | Freq: Two times a day (BID) | ORAL | 0 refills | Status: DC
Start: 2022-10-13 — End: 2022-11-04

## 2022-10-13 MED ORDER — SODIUM CHLORIDE 0.9 % IV SOLN
15.0000 mg/kg | Freq: Once | INTRAVENOUS | Status: AC
  Administered 2022-10-13: 900 mg via INTRAVENOUS
  Filled 2022-10-13: qty 4
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
## 2022-10-13 NOTE — Patient Instructions (Addendum)
[facility]  Discharge Instructions: Thank you for choosing [facility] to provide your oncology and hematology care.   If you have a lab appointment with the Cancer Center, please go directly to the Cancer Center and check in at the registration area.   Wear comfortable clothing and clothing appropriate for easy access to any Portacath or PICC line.   We strive to give you quality time with your provider. You may need to reschedule your appointment if you arrive late (15 or more minutes).  Arriving late affects you and other patients whose appointments are after yours.  Also, if you miss three or more appointments without notifying the office, you may be dismissed from the clinic at the provider's discretion.      For prescription refill requests, have your pharmacy contact our office and allow 72 hours for refills to be completed.    Today you received the following chemotherapy and/or immunotherapy agents: Atezolizumab (Tecentriq) and Bevacizumab (MVASI)   To help prevent nausea and vomiting after your treatment, we encourage you to take your nausea medication as directed.  BELOW ARE SYMPTOMS THAT SHOULD BE REPORTED IMMEDIATELY: *FEVER GREATER THAN 100.4 F (38 C) OR HIGHER *CHILLS OR SWEATING *NAUSEA AND VOMITING THAT IS NOT CONTROLLED WITH YOUR NAUSEA MEDICATION *UNUSUAL SHORTNESS OF BREATH *UNUSUAL BRUISING OR BLEEDING *URINARY PROBLEMS (pain or burning when urinating, or frequent urination) *BOWEL PROBLEMS (unusual diarrhea, constipation, pain near the anus) TENDERNESS IN MOUTH AND THROAT WITH OR WITHOUT PRESENCE OF ULCERS (sore throat, sores in mouth, or a toothache) UNUSUAL RASH, SWELLING OR PAIN  UNUSUAL VAGINAL DISCHARGE OR ITCHING   Items with * indicate a potential emergency and should be followed up as soon as possible or go to the Emergency Department if any problems should occur.  Please show the CHEMOTHERAPY ALERT CARD  or IMMUNOTHERAPY ALERT CARD at check-in to the Emergency Department and triage nurse.  Should you have questions after your visit or need to cancel or reschedule your appointment, please contact [facility] [facility]  Dept: [phone]  and follow the prompts.  Office hours are 8:00 a.m. to 4:30 p.m. Monday - Friday. Please note that voicemails left after 4:00 p.m. may not be returned until the following business day.  We are closed weekends and major holidays. You have access to a nurse at all times for urgent questions. Please call the main number to the clinic Dept: [phone] and follow the prompts.   For any non-urgent questions, you may also contact your provider using MyChart. We now offer e-Visits for anyone 58 and older to request care online for non-urgent symptoms. For details visit [email or web address].   Also download the MyChart app! Go to the app store, search "MyChart", open the app, select [place], and log in with your MyChart username and password.  Atezolizumab Injection What is this medication? ATEZOLIZUMAB (a te zoe LIZ ue mab) treats some types of cancer. It works by helping your immune system slow or stop the spread of cancer cells. It is a monoclonal antibody. This medicine may be used for other purposes; ask your health care provider or pharmacist if you have questions. COMMON BRAND NAME(S): Tecentriq What should I tell my care team before I take this medication? They need to know if you have any of these conditions: Allogeneic stem cell transplant (uses someone else's stem cells) Autoimmune diseases, such as Crohn disease, ulcerative colitis, lupus History of chest  radiation Nervous system problems, such as Guillain-Barre syndrome, myasthenia gravis Organ transplant An unusual or allergic reaction to atezolizumab, other medications, foods, dyes, or preservatives Pregnant or trying to get pregnant Breast-feeding How should I use  this medication? This medication is injected into a vein. It is given by your care team in a hospital or clinic setting. A special MedGuide will be given to you before each treatment. Be sure to read this information carefully each time. Talk to your care team about the use of this medication in children. While it may be prescribed for children as young as 2 years for selected conditions, precautions do apply. Overdosage: If you think you have taken too much of this medicine contact a poison control center or emergency room at once. NOTE: This medicine is only for you. Do not share this medicine with others. What if I miss a dose? Keep appointments for follow-up doses. It is important not to miss your dose. Call your care team if you are unable to keep an appointment. What may interact with this medication? Interactions have not been studied. This list may not describe all possible interactions. Give your health care provider a list of all the medicines, herbs, non-prescription drugs, or dietary supplements you use. Also tell them if you smoke, drink alcohol, or use illegal drugs. Some items may interact with your medicine. What should I watch for while using this medication? Your condition will be monitored carefully while you are receiving this medication. You may need blood work while taking this medication. This medication may cause serious skin reactions. They can happen weeks to months after starting the medication. Contact your care team right away if you notice fevers or flu-like symptoms with a rash. The rash may be red or purple and then turn into blisters or peeling of the skin. You may also notice a red rash with swelling of the face, lips, or lymph nodes in your neck or under your arms. Tell your care team right away if you have any change in your eyesight. Talk to your care team if you may be pregnant. Serious birth defects can occur if you take this medication during pregnancy and for 5  months after the last dose. You will need a negative pregnancy test before starting this medication. Contraception is recommended while taking this medication and for 5 months after the last dose. Your care team can help you find the option that works for you. Do not breastfeed while taking this medication and for at least 5 months after the last dose. What side effects may I notice from receiving this medication? Side effects that you should report to your doctor or health care professional as soon as possible: Allergic reactions--skin rash, itching, hives, swelling of the face, lips, tongue, or throat Dry cough, shortness of breath or trouble breathing Eye pain, redness, irritation, or discharge with blurry or decreased vision Heart muscle inflammation--unusual weakness or fatigue, shortness of breath, chest pain, fast or irregular heartbeat, dizziness, swelling of the ankles, feet, or hands Hormone gland problems--headache, sensitivity to light, unusual weakness or fatigue, dizziness, fast or irregular heartbeat, increased sensitivity to cold or heat, excessive sweating, constipation, hair loss, increased thirst or amount of urine, tremors or shaking, irritability Infusion reactions--chest pain, shortness of breath or trouble breathing, feeling faint or lightheaded Kidney injury (glomerulonephritis)--decrease in the amount of urine, red or dark brown urine, foamy or bubbly urine, swelling of the ankles, hands, or feet Liver injury--right upper belly pain, loss of  appetite, nausea, light-colored stool, dark yellow or brown urine, yellowing skin or eyes, unusual weakness or fatigue Pain, tingling, or numbness in the hands or feet, muscle weakness, change in vision, confusion or trouble speaking, loss of balance or coordination, trouble walking, seizures Rash, fever, and swollen lymph nodes Redness, blistering, peeling, or loosening of the skin, including inside the mouth Sudden or severe stomach  pain, bloody diarrhea, fever, nausea, vomiting Side effects that usually do not require medical attention (report to your doctor or health care professional if they continue or are bothersome): Bone, joint, or muscle pain Diarrhea Fatigue Loss of appetite Nausea Skin rash This list may not describe all possible side effects. Call your doctor for medical advice about side effects. You may report side effects to FDA at 1-800-FDA-1088. Where should I keep my medication? This medication is given in a hospital or clinic. It will not be stored at home. NOTE: This sheet is a summary. It may not cover all possible information. If you have questions about this medicine, talk to your doctor, pharmacist, or health care provider.  2024 Elsevier/Gold Standard (2021-09-13 00:00:00) Bevacizumab Injection What is this medication? BEVACIZUMAB (be va SIZ yoo mab) treats some types of cancer. It works by blocking a protein that causes cancer cells to grow and multiply. This helps to slow or stop the spread of cancer cells. It is a monoclonal antibody. This medicine may be used for other purposes; ask your health care provider or pharmacist if you have questions. COMMON BRAND NAME(S): Alymsys, Avastin, MVASI, Omer Jack What should I tell my care team before I take this medication? They need to know if you have any of these conditions: Blood clots Coughing up blood Having or recent surgery Heart failure High blood pressure History of a connection between 2 or more body parts that do not usually connect (fistula) History of a tear in your stomach or intestines Protein in your urine An unusual or allergic reaction to bevacizumab, other medications, foods, dyes, or preservatives Pregnant or trying to get pregnant Breast-feeding How should I use this medication? This medication is injected into a vein. It is given by your care team in a hospital or clinic setting. Talk to your care team the use of this  medication in children. Special care may be needed. Overdosage: If you think you have taken too much of this medicine contact a poison control center or emergency room at once. NOTE: This medicine is only for you. Do not share this medicine with others. What if I miss a dose? Keep appointments for follow-up doses. It is important not to miss your dose. Call your care team if you are unable to keep an appointment. What may interact with this medication? Interactions are not expected. This list may not describe all possible interactions. Give your health care provider a list of all the medicines, herbs, non-prescription drugs, or dietary supplements you use. Also tell them if you smoke, drink alcohol, or use illegal drugs. Some items may interact with your medicine. What should I watch for while using this medication? Your condition will be monitored carefully while you are receiving this medication. You may need blood work while taking this medication. This medication may make you feel generally unwell. This is not uncommon as chemotherapy can affect healthy cells as well as cancer cells. Report any side effects. Continue your course of treatment even though you feel ill unless your care team tells you to stop. This medication may increase your risk to  bruise or bleed. Call your care team if you notice any unusual bleeding. Before having surgery, talk to your care team to make sure it is ok. This medication can increase the risk of poor healing of your surgical site or wound. You will need to stop this medication for 28 days before surgery. After surgery, wait at least 28 days before restarting this medication. Make sure the surgical site or wound is healed enough before restarting this medication. Talk to your care team if questions. Talk to your care team if you may be pregnant. Serious birth defects can occur if you take this medication during pregnancy and for 6 months after the last dose.  Contraception is recommended while taking this medication and for 6 months after the last dose. Your care team can help you find the option that works for you. Do not breastfeed while taking this medication and for 6 months after the last dose. This medication can cause infertility. Talk to your care team if you are concerned about your fertility. What side effects may I notice from receiving this medication? Side effects that you should report to your care team as soon as possible: Allergic reactions--skin rash, itching, hives, swelling of the face, lips, tongue, or throat Bleeding--bloody or black, tar-like stools, vomiting blood or brown material that looks like coffee grounds, red or dark brown urine, small red or purple spots on skin, unusual bruising or bleeding Blood clot--pain, swelling, or warmth in the leg, shortness of breath, chest pain Heart attack--pain or tightness in the chest, shoulders, arms, or jaw, nausea, shortness of breath, cold or clammy skin, feeling faint or lightheaded Heart failure--shortness of breath, swelling of the ankles, feet, or hands, sudden weight gain, unusual weakness or fatigue Increase in blood pressure Infection--fever, chills, cough, sore throat, wounds that don't heal, pain or trouble when passing urine, general feeling of discomfort or being unwell Infusion reactions--chest pain, shortness of breath or trouble breathing, feeling faint or lightheaded Kidney injury--decrease in the amount of urine, swelling of the ankles, hands, or feet Stomach pain that is severe, does not go away, or gets worse Stroke--sudden numbness or weakness of the face, arm, or leg, trouble speaking, confusion, trouble walking, loss of balance or coordination, dizziness, severe headache, change in vision Sudden and severe headache, confusion, change in vision, seizures, which may be signs of posterior reversible encephalopathy syndrome (PRES) Side effects that usually do not require  medical attention (report to your care team if they continue or are bothersome): Back pain Change in taste Diarrhea Dry skin Increased tears Nosebleed This list may not describe all possible side effects. Call your doctor for medical advice about side effects. You may report side effects to FDA at 1-800-FDA-1088. Where should I keep my medication? This medication is given in a hospital or clinic. It will not be stored at home. NOTE: This sheet is a summary. It may not cover all possible information. If you have questions about this medicine, talk to your doctor, pharmacist, or health care provider.  2024 Elsevier/Gold Standard (2021-09-13 00:00:00) Rehydration, Older Adult  Rehydration is the replacement of fluids, salts, and minerals in the body (electrolytes) that are lost during dehydration. Dehydration is when there is not enough water or other fluids in the body. This happens when you lose more fluids than you take in. People who are age 59 or older have a higher risk of dehydration than younger adults. This is because in older age, the body: Is less able to  maintain the right amount of water. Does not respond to temperature changes as well. Does not get a sense of thirst as easily or quickly. Other causes include: Not drinking enough fluids. This can occur when you are ill, when you forget to drink, or when you are doing activities that require a lot of energy, especially in hot weather. Conditions that cause loss of water or other fluids. These include diarrhea, vomiting, sweating, or urinating a lot. Other illnesses, such as fever or infection. Certain medicines, such as those that remove excess fluid from the body (diuretics). Symptoms of mild or moderate dehydration may include thirst, dry lips and mouth, and dizziness. Symptoms of severe dehydration may include increased heart rate, confusion, fainting, and not urinating. In severe cases, you may need to get fluids through an IV  at the hospital. For mild or moderate cases, you can usually rehydrate at home by drinking certain fluids as told by your health care provider. What are the risks? Rehydration is usually safe. Taking in too much fluid (overhydration) can be a problem but is rare. Overhydration can cause an imbalance of electrolytes in the body, kidney failure, fluid in the lungs, or a decrease in salt (sodium) levels in the body. Supplies needed: You will need an oral rehydration solution (ORS) if your health care provider tells you to use one. This is a drink to treat dehydration. It can be found in pharmacies and retail stores. How to rehydrate Fluids Follow instructions from your health care provider about what to drink. The kind of fluid and the amount you should drink depend on your condition. In general, you should choose drinks that you prefer. If told by your health care provider, drink an ORS. Make an ORS by following instructions on the package. Start by drinking small amounts, about  cup (120 mL) every 5-10 minutes. Slowly increase how much you drink until you have taken in the amount recommended by your health care provider. Drink enough clear fluids to keep your urine pale yellow. If you were told to drink an ORS, finish it first, then start slowly drinking other clear fluids. Drink fluids such as: Water. This includes sparkling and flavored water. Drinking only water can lead to having too little sodium in your body (hyponatremia). Follow the advice of your health care provider. Water from ice chips you suck on. Fruit juice with water added to it(diluted). Sports drinks. Hot or cold herbal teas. Broth-based soups. Coffee. Milk or milk products. Food Follow instructions from your health care provider about what to eat while you rehydrate. Your health care provider may recommend that you slowly begin eating regular foods in small amounts. Eat foods that contain a healthy balance of electrolytes,  such as bananas, oranges, potatoes, tomatoes, and spinach. Avoid foods that are greasy or contain a lot of sugar. In some cases, you may get nutrition through a feeding tube that is passed through your nose and into your stomach (nasogastric tube, or NG tube). This may be done if you have uncontrolled vomiting or diarrhea. Drinks to avoid  Certain drinks may make dehydration worse. While you rehydrate, avoid drinking alcohol. How to tell if you are recovering from dehydration You may be getting better if: You are urinating more often than before you started rehydrating. Your urine is pale yellow. Your energy level improves. You vomit less often. You have diarrhea less often. Your appetite improves or returns to normal. You feel less dizzy or light-headed. Your skin tone and color  start to look more normal. Follow these instructions at home: Take over-the-counter and prescription medicines only as told by your health care provider. Do not take sodium tablets. Doing this can lead to having too much sodium in your body (hypernatremia). Contact a health care provider if: You continue to have symptoms of mild or moderate dehydration, such as: Thirst. Dry lips. Slightly dry mouth. Dizziness. Dark urine or less urine than usual. Muscle cramps. You continue to vomit or have diarrhea. Get help right away if: You have symptoms of dehydration that get worse. You have a fever. You have a severe headache. You have been vomiting and have problems, such as: Your vomiting gets worse. Your vomit includes blood or green matter (bile). You cannot eat or drink without vomiting. You have problems with urination or bowel movements, such as: Diarrhea that gets worse. Blood in your stool (feces). This may cause stool to look black and tarry. Not urinating, or urinating only a small amount of very dark urine, within 6-8 hours. You have trouble breathing. You have symptoms that get worse with  treatment. These symptoms may be an emergency. Get help right away. Call 911. Do not wait to see if the symptoms will go away. Do not drive yourself to the hospital. This information is not intended to replace advice given to you by your health care provider. Make sure you discuss any questions you have with your health care provider. Document Revised: 09/11/2021 Document Reviewed: 09/09/2021 Elsevier Patient Education  2024 [employer].
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
## 2022-10-14 ENCOUNTER — Other Ambulatory Visit: Payer: Self-pay

## 2022-10-14 LAB — T4: T4, Total: 6 ug/dL (ref 4.5–12.0)

## 2022-10-15 ENCOUNTER — Telehealth: Payer: Self-pay

## 2022-10-15 ENCOUNTER — Inpatient Hospital Stay (HOSPITAL_BASED_OUTPATIENT_CLINIC_OR_DEPARTMENT_OTHER): Payer: No Typology Code available for payment source | Admitting: Physician Assistant

## 2022-10-15 ENCOUNTER — Other Ambulatory Visit: Payer: Self-pay

## 2022-10-15 ENCOUNTER — Inpatient Hospital Stay: Payer: No Typology Code available for payment source

## 2022-10-15 VITALS — BP 126/68 | HR 84 | Temp 98.0°F | Resp 16

## 2022-10-15 VITALS — BP 106/62 | HR 80 | Temp 98.0°F | Resp 16 | Wt 129.7 lb

## 2022-10-15 DIAGNOSIS — G893 Neoplasm related pain (acute) (chronic): Secondary | ICD-10-CM

## 2022-10-15 DIAGNOSIS — E860 Dehydration: Secondary | ICD-10-CM

## 2022-10-15 DIAGNOSIS — R109 Unspecified abdominal pain: Secondary | ICD-10-CM

## 2022-10-15 DIAGNOSIS — K921 Melena: Secondary | ICD-10-CM

## 2022-10-15 DIAGNOSIS — D649 Anemia, unspecified: Secondary | ICD-10-CM

## 2022-10-15 DIAGNOSIS — R110 Nausea: Secondary | ICD-10-CM

## 2022-10-15 DIAGNOSIS — Z5112 Encounter for antineoplastic immunotherapy: Secondary | ICD-10-CM | POA: Diagnosis not present

## 2022-10-15 LAB — CBC WITH DIFFERENTIAL (CANCER CENTER ONLY)
Abs Immature Granulocytes: 0.01 10*3/uL (ref 0.00–0.07)
Basophils Absolute: 0 10*3/uL (ref 0.0–0.1)
Basophils Relative: 1 %
Eosinophils Absolute: 0.1 10*3/uL (ref 0.0–0.5)
Eosinophils Relative: 3 %
HCT: 22.7 % — ABNORMAL LOW (ref 39.0–52.0)
Hemoglobin: 7.7 g/dL — ABNORMAL LOW (ref 13.0–17.0)
Immature Granulocytes: 0 %
Lymphocytes Relative: 21 %
Lymphs Abs: 0.8 10*3/uL (ref 0.7–4.0)
MCH: 32 pg (ref 26.0–34.0)
MCHC: 33.9 g/dL (ref 30.0–36.0)
MCV: 94.2 fL (ref 80.0–100.0)
Monocytes Absolute: 0.5 10*3/uL (ref 0.1–1.0)
Monocytes Relative: 14 %
Neutro Abs: 2.4 10*3/uL (ref 1.7–7.7)
Neutrophils Relative %: 61 %
Platelet Count: 104 10*3/uL — ABNORMAL LOW (ref 150–400)
RBC: 2.41 MIL/uL — ABNORMAL LOW (ref 4.22–5.81)
RDW: 15 % (ref 11.5–15.5)
WBC Count: 3.9 10*3/uL — ABNORMAL LOW (ref 4.0–10.5)
nRBC: 0 % (ref 0.0–0.2)

## 2022-10-15 LAB — CMP (CANCER CENTER ONLY)
ALT: 30 U/L (ref 0–44)
AST: 74 U/L — ABNORMAL HIGH (ref 15–41)
Albumin: 3.1 g/dL — ABNORMAL LOW (ref 3.5–5.0)
Alkaline Phosphatase: 130 U/L — ABNORMAL HIGH (ref 38–126)
Anion gap: 7 (ref 5–15)
BUN: 21 mg/dL (ref 8–23)
CO2: 23 mmol/L (ref 22–32)
Calcium: 9.3 mg/dL (ref 8.9–10.3)
Chloride: 109 mmol/L (ref 98–111)
Creatinine: 0.95 mg/dL (ref 0.61–1.24)
GFR, Estimated: >60 mL/min (ref >60)
Glucose, Bld: 101 mg/dL — ABNORMAL HIGH (ref 70–99)
Potassium: 4.4 mmol/L (ref 3.5–5.1)
Sodium: 139 mmol/L (ref 135–145)
Total Bilirubin: 0.5 mg/dL (ref 0.3–1.2)
Total Protein: 6.6 g/dL (ref 6.5–8.1)

## 2022-10-15 LAB — TYPE AND SCREEN

## 2022-10-15 LAB — BPAM RBC
Blood Product Expiration Date: 202406242359
ISSUE DATE / TIME: 202406051330
Unit Type and Rh: 6200

## 2022-10-15 LAB — SAMPLE TO BLOOD BANK

## 2022-10-15 LAB — ABO/RH: ABO/RH(D): A POS

## 2022-10-15 LAB — PREPARE RBC (CROSSMATCH)

## 2022-10-15 MED ORDER — HEPARIN SOD (PORK) LOCK FLUSH 100 UNIT/ML IV SOLN
500.0000 [IU] | Freq: Every day | INTRAVENOUS | Status: AC | PRN
  Administered 2022-10-15: 500 [IU]

## 2022-10-15 MED ORDER — MORPHINE SULFATE (PF) 2 MG/ML IV SOLN
2.0000 mg | Freq: Once | INTRAVENOUS | Status: AC
  Administered 2022-10-15: 2 mg via INTRAVENOUS
  Filled 2022-10-15: qty 1

## 2022-10-15 MED ORDER — PANTOPRAZOLE SODIUM 40 MG IV SOLR
40.0000 mg | Freq: Once | INTRAVENOUS | Status: AC
  Administered 2022-10-15: 40 mg via INTRAVENOUS
  Filled 2022-10-15: qty 10

## 2022-10-15 MED ORDER — ACETAMINOPHEN 325 MG PO TABS
650.0000 mg | ORAL_TABLET | Freq: Once | ORAL | Status: AC
  Administered 2022-10-15: 650 mg via ORAL
  Filled 2022-10-15: qty 2

## 2022-10-15 MED ORDER — ONDANSETRON HCL 4 MG/2ML IJ SOLN
4.0000 mg | Freq: Once | INTRAMUSCULAR | Status: AC
  Administered 2022-10-15: 4 mg via INTRAVENOUS
  Filled 2022-10-15: qty 2

## 2022-10-15 MED ORDER — SODIUM CHLORIDE 0.9% FLUSH
10.0000 mL | INTRAVENOUS | Status: AC | PRN
  Administered 2022-10-15: 10 mL

## 2022-10-15 MED ORDER — SODIUM CHLORIDE 0.9 % IV SOLN: Freq: Once | INTRAVENOUS | Status: AC

## 2022-10-15 MED ORDER — SODIUM CHLORIDE 0.9% IV SOLUTION
250.0000 mL | Freq: Once | INTRAVENOUS | Status: AC
  Administered 2022-10-15: 250 mL via INTRAVENOUS

## 2022-10-15 MED ORDER — DIPHENHYDRAMINE HCL 25 MG PO CAPS
25.0000 mg | ORAL_CAPSULE | Freq: Once | ORAL | Status: AC
  Administered 2022-10-15: 25 mg via ORAL
  Filled 2022-10-15: qty 1

## 2022-10-15 NOTE — Telephone Encounter (Signed)
This RN called and spoke to Troy [name] in regards to staff message from Sharlette Dense, CMA. Per patient's wife, [name] is having abdominal pain and dark stool since his last chemo treatment. Appointment made for 11am in [facility].

## 2022-10-15 NOTE — Telephone Encounter (Signed)
Patients wife called stating patient is having a lot of abdominal pain since his treatment on Monday. He is having dark stools. Patient had an endoscopy and they found a vein that was bleeding and they repaired it.
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
## 2022-10-15 NOTE — Patient Instructions (Signed)

## 2022-10-15 NOTE — Progress Notes (Signed)
See office note from same day

## 2022-10-16 ENCOUNTER — Encounter (HOSPITAL_COMMUNITY): Payer: Self-pay

## 2022-10-16 ENCOUNTER — Inpatient Hospital Stay (HOSPITAL_COMMUNITY)
Admission: RE | Admit: 2022-10-16 | Discharge: 2022-10-19 | DRG: 377 | Disposition: A | Discharge status: Home / Self Care | Payer: No Typology Code available for payment source | Source: Ambulatory Visit | Attending: Family Medicine | Admitting: Family Medicine

## 2022-10-16 ENCOUNTER — Inpatient Hospital Stay: Payer: No Typology Code available for payment source

## 2022-10-16 ENCOUNTER — Other Ambulatory Visit: Payer: Self-pay

## 2022-10-16 ENCOUNTER — Inpatient Hospital Stay: Payer: No Typology Code available for payment source | Admitting: Physician Assistant

## 2022-10-16 ENCOUNTER — Telehealth: Payer: Self-pay

## 2022-10-16 VITALS — BP 120/70 | HR 89 | Temp 98.1°F | Resp 20 | Wt 131.1 lb

## 2022-10-16 VITALS — BP 121/74 | HR 89 | Resp 18

## 2022-10-16 DIAGNOSIS — Z515 Encounter for palliative care: Secondary | ICD-10-CM | POA: Diagnosis not present

## 2022-10-16 DIAGNOSIS — K921 Melena: Secondary | ICD-10-CM

## 2022-10-16 DIAGNOSIS — K259 Gastric ulcer, unspecified as acute or chronic, without hemorrhage or perforation: Secondary | ICD-10-CM | POA: Diagnosis not present

## 2022-10-16 DIAGNOSIS — K7469 Other cirrhosis of liver: Secondary | ICD-10-CM | POA: Diagnosis not present

## 2022-10-16 DIAGNOSIS — Z923 Personal history of irradiation: Secondary | ICD-10-CM

## 2022-10-16 DIAGNOSIS — K922 Gastrointestinal hemorrhage, unspecified: Principal | ICD-10-CM

## 2022-10-16 DIAGNOSIS — Z5112 Encounter for antineoplastic immunotherapy: Secondary | ICD-10-CM | POA: Diagnosis present

## 2022-10-16 DIAGNOSIS — D62 Acute posthemorrhagic anemia: Secondary | ICD-10-CM | POA: Diagnosis present

## 2022-10-16 DIAGNOSIS — D696 Thrombocytopenia, unspecified: Secondary | ICD-10-CM | POA: Diagnosis present

## 2022-10-16 DIAGNOSIS — I868 Varicose veins of other specified sites: Secondary | ICD-10-CM | POA: Diagnosis not present

## 2022-10-16 DIAGNOSIS — Z7984 Long term (current) use of oral hypoglycemic drugs: Secondary | ICD-10-CM

## 2022-10-16 DIAGNOSIS — B182 Chronic viral hepatitis C: Secondary | ICD-10-CM | POA: Diagnosis present

## 2022-10-16 DIAGNOSIS — C220 Liver cell carcinoma: Secondary | ICD-10-CM | POA: Diagnosis not present

## 2022-10-16 DIAGNOSIS — Z87891 Personal history of nicotine dependence: Secondary | ICD-10-CM | POA: Diagnosis not present

## 2022-10-16 DIAGNOSIS — Z7901 Long term (current) use of anticoagulants: Secondary | ICD-10-CM

## 2022-10-16 DIAGNOSIS — B1920 Unspecified viral hepatitis C without hepatic coma: Secondary | ICD-10-CM | POA: Diagnosis not present

## 2022-10-16 DIAGNOSIS — Z800 Family history of malignant neoplasm of digestive organs: Secondary | ICD-10-CM

## 2022-10-16 DIAGNOSIS — E43 Unspecified severe protein-calorie malnutrition: Secondary | ICD-10-CM | POA: Diagnosis present

## 2022-10-16 DIAGNOSIS — D649 Anemia, unspecified: Secondary | ICD-10-CM

## 2022-10-16 DIAGNOSIS — E7800 Pure hypercholesterolemia, unspecified: Secondary | ICD-10-CM | POA: Diagnosis present

## 2022-10-16 DIAGNOSIS — I10 Essential (primary) hypertension: Secondary | ICD-10-CM | POA: Diagnosis not present

## 2022-10-16 DIAGNOSIS — K31811 Angiodysplasia of stomach and duodenum with bleeding: Secondary | ICD-10-CM | POA: Diagnosis not present

## 2022-10-16 DIAGNOSIS — K828 Other specified diseases of gallbladder: Secondary | ICD-10-CM | POA: Diagnosis not present

## 2022-10-16 DIAGNOSIS — K766 Portal hypertension: Secondary | ICD-10-CM | POA: Diagnosis present

## 2022-10-16 DIAGNOSIS — Z79620 Long term (current) use of immunosuppressive biologic: Secondary | ICD-10-CM | POA: Diagnosis not present

## 2022-10-16 DIAGNOSIS — Z79899 Other long term (current) drug therapy: Secondary | ICD-10-CM | POA: Diagnosis not present

## 2022-10-16 DIAGNOSIS — K5900 Constipation, unspecified: Secondary | ICD-10-CM | POA: Diagnosis not present

## 2022-10-16 DIAGNOSIS — Z8711 Personal history of peptic ulcer disease: Secondary | ICD-10-CM | POA: Diagnosis not present

## 2022-10-16 DIAGNOSIS — K254 Chronic or unspecified gastric ulcer with hemorrhage: Principal | ICD-10-CM | POA: Diagnosis present

## 2022-10-16 DIAGNOSIS — I8501 Esophageal varices with bleeding: Secondary | ICD-10-CM | POA: Diagnosis present

## 2022-10-16 DIAGNOSIS — K7460 Unspecified cirrhosis of liver: Secondary | ICD-10-CM | POA: Diagnosis present

## 2022-10-16 DIAGNOSIS — E119 Type 2 diabetes mellitus without complications: Secondary | ICD-10-CM | POA: Diagnosis not present

## 2022-10-16 DIAGNOSIS — R319 Hematuria, unspecified: Secondary | ICD-10-CM | POA: Diagnosis present

## 2022-10-16 DIAGNOSIS — I81 Portal vein thrombosis: Secondary | ICD-10-CM | POA: Diagnosis present

## 2022-10-16 DIAGNOSIS — Z794 Long term (current) use of insulin: Secondary | ICD-10-CM

## 2022-10-16 DIAGNOSIS — I864 Gastric varices: Secondary | ICD-10-CM | POA: Diagnosis not present

## 2022-10-16 DIAGNOSIS — E860 Dehydration: Secondary | ICD-10-CM

## 2022-10-16 DIAGNOSIS — R109 Unspecified abdominal pain: Secondary | ICD-10-CM

## 2022-10-16 DIAGNOSIS — Z8719 Personal history of other diseases of the digestive system: Secondary | ICD-10-CM

## 2022-10-16 DIAGNOSIS — Z8249 Family history of ischemic heart disease and other diseases of the circulatory system: Secondary | ICD-10-CM

## 2022-10-16 DIAGNOSIS — Z539 Procedure and treatment not carried out, unspecified reason: Secondary | ICD-10-CM | POA: Diagnosis present

## 2022-10-16 DIAGNOSIS — K3189 Other diseases of stomach and duodenum: Secondary | ICD-10-CM | POA: Diagnosis present

## 2022-10-16 DIAGNOSIS — I700 Atherosclerosis of aorta: Secondary | ICD-10-CM | POA: Diagnosis not present

## 2022-10-16 DIAGNOSIS — G893 Neoplasm related pain (acute) (chronic): Secondary | ICD-10-CM | POA: Diagnosis not present

## 2022-10-16 DIAGNOSIS — R188 Other ascites: Secondary | ICD-10-CM | POA: Diagnosis not present

## 2022-10-16 LAB — CMP (CANCER CENTER ONLY)
ALT: 32 U/L (ref 0–44)
AST: 81 U/L — ABNORMAL HIGH (ref 15–41)
Albumin: 3.2 g/dL — ABNORMAL LOW (ref 3.5–5.0)
Alkaline Phosphatase: 137 U/L — ABNORMAL HIGH (ref 38–126)
Anion gap: 8 (ref 5–15)
BUN: 19 mg/dL (ref 8–23)
CO2: 23 mmol/L (ref 22–32)
Calcium: 9.2 mg/dL (ref 8.9–10.3)
Chloride: 108 mmol/L (ref 98–111)
Creatinine: 1.1 mg/dL (ref 0.61–1.24)
GFR, Estimated: >60 mL/min (ref >60)
Glucose, Bld: 259 mg/dL — ABNORMAL HIGH (ref 70–99)
Potassium: 3.7 mmol/L (ref 3.5–5.1)
Sodium: 139 mmol/L (ref 135–145)
Total Bilirubin: 0.7 mg/dL (ref 0.3–1.2)
Total Protein: 6.7 g/dL (ref 6.5–8.1)

## 2022-10-16 LAB — CBC WITH DIFFERENTIAL (CANCER CENTER ONLY)
Abs Immature Granulocytes: 0.02 10*3/uL (ref 0.00–0.07)
Basophils Absolute: 0 10*3/uL (ref 0.0–0.1)
Basophils Relative: 1 %
Eosinophils Absolute: 0.1 10*3/uL (ref 0.0–0.5)
Eosinophils Relative: 3 %
HCT: 26.4 % — ABNORMAL LOW (ref 39.0–52.0)
Hemoglobin: 8.9 g/dL — ABNORMAL LOW (ref 13.0–17.0)
Immature Granulocytes: 1 %
Lymphocytes Relative: 16 %
Lymphs Abs: 0.6 10*3/uL — ABNORMAL LOW (ref 0.7–4.0)
MCH: 31.7 pg (ref 26.0–34.0)
MCHC: 33.7 g/dL (ref 30.0–36.0)
MCV: 94 fL (ref 80.0–100.0)
Monocytes Absolute: 0.5 10*3/uL (ref 0.1–1.0)
Monocytes Relative: 15 %
Neutro Abs: 2.3 10*3/uL (ref 1.7–7.7)
Neutrophils Relative %: 64 %
Platelet Count: 93 10*3/uL — ABNORMAL LOW (ref 150–400)
RBC: 2.81 MIL/uL — ABNORMAL LOW (ref 4.22–5.81)
RDW: 15.2 % (ref 11.5–15.5)
WBC Count: 3.5 10*3/uL — ABNORMAL LOW (ref 4.0–10.5)
nRBC: 0 % (ref 0.0–0.2)

## 2022-10-16 LAB — PROTIME-INR
INR: 1.2 (ref 0.8–1.2)
Prothrombin Time: 15.1 seconds (ref 11.4–15.2)

## 2022-10-16 LAB — TYPE AND SCREEN
ABO/RH(D): A POS
Antibody Screen: NEGATIVE
Unit division: 0

## 2022-10-16 LAB — GLUCOSE, CAPILLARY
Glucose-Capillary: 212 mg/dL — ABNORMAL HIGH (ref 70–99)
Glucose-Capillary: 334 mg/dL — ABNORMAL HIGH (ref 70–99)

## 2022-10-16 LAB — SAMPLE TO BLOOD BANK

## 2022-10-16 LAB — BPAM RBC

## 2022-10-16 MED ORDER — PANTOPRAZOLE SODIUM 40 MG IV SOLR
40.0000 mg | Freq: Two times a day (BID) | INTRAVENOUS | Status: DC
  Administered 2022-10-16 – 2022-10-19 (×6): 40 mg via INTRAVENOUS
  Filled 2022-10-16 (×6): qty 10

## 2022-10-16 MED ORDER — ACETAMINOPHEN 325 MG PO TABS
650.0000 mg | ORAL_TABLET | Freq: Four times a day (QID) | ORAL | Status: DC | PRN
  Administered 2022-10-16: 650 mg via ORAL
  Filled 2022-10-16: qty 2

## 2022-10-16 MED ORDER — ACETAMINOPHEN 325 MG PO TABS
650.0000 mg | ORAL_TABLET | Freq: Once | ORAL | Status: AC
  Administered 2022-10-16: 650 mg via ORAL
  Filled 2022-10-16: qty 2

## 2022-10-16 MED ORDER — MORPHINE SULFATE ER 15 MG PO TBCR
15.0000 mg | EXTENDED_RELEASE_TABLET | Freq: Two times a day (BID) | ORAL | Status: DC
  Administered 2022-10-16 – 2022-10-19 (×6): 15 mg via ORAL
  Filled 2022-10-16 (×6): qty 1

## 2022-10-16 MED ORDER — INSULIN ASPART 100 UNIT/ML IJ SOLN
0.0000 [IU] | Freq: Every day | INTRAMUSCULAR | Status: DC
  Administered 2022-10-16 – 2022-10-18 (×2): 4 [IU] via SUBCUTANEOUS

## 2022-10-16 MED ORDER — SODIUM CHLORIDE 0.9 % IV SOLN
50.0000 ug/h | INTRAVENOUS | Status: DC
  Administered 2022-10-16 – 2022-10-19 (×6): 50 ug/h via INTRAVENOUS
  Filled 2022-10-16 (×11): qty 1

## 2022-10-16 MED ORDER — INSULIN ASPART 100 UNIT/ML IJ SOLN
0.0000 [IU] | Freq: Three times a day (TID) | INTRAMUSCULAR | Status: DC
  Administered 2022-10-16 – 2022-10-17 (×2): 3 [IU] via SUBCUTANEOUS
  Administered 2022-10-17 – 2022-10-18 (×4): 2 [IU] via SUBCUTANEOUS
  Administered 2022-10-19: 5 [IU] via SUBCUTANEOUS
  Administered 2022-10-19: 1 [IU] via SUBCUTANEOUS

## 2022-10-16 MED ORDER — MORPHINE SULFATE 10 MG/5ML PO SOLN
2.5000 mg | ORAL | Status: DC | PRN
  Administered 2022-10-16: 5 mg via ORAL
  Filled 2022-10-16: qty 5

## 2022-10-16 MED ORDER — SODIUM CHLORIDE 0.9 % IV SOLN
2.0000 g | Freq: Every day | INTRAVENOUS | Status: DC
  Administered 2022-10-16 – 2022-10-17 (×2): 2 g via INTRAVENOUS
  Filled 2022-10-16 (×2): qty 20

## 2022-10-16 MED ORDER — CARVEDILOL 3.125 MG PO TABS
3.1250 mg | ORAL_TABLET | Freq: Every day | ORAL | Status: DC
  Administered 2022-10-16 – 2022-10-17 (×2): 3.125 mg via ORAL
  Filled 2022-10-16 (×3): qty 1

## 2022-10-16 MED ORDER — ACETAMINOPHEN 650 MG RE SUPP: 650.0000 mg | Freq: Four times a day (QID) | RECTAL | Status: DC | PRN

## 2022-10-16 MED ORDER — SODIUM CHLORIDE 0.9 % IV SOLN: Freq: Once | INTRAVENOUS | Status: AC

## 2022-10-16 MED ORDER — ENSURE ENLIVE PO LIQD
237.0000 mL | Freq: Two times a day (BID) | ORAL | Status: DC
  Administered 2022-10-17: 237 mL via ORAL

## 2022-10-16 MED ORDER — ALBUTEROL SULFATE (2.5 MG/3ML) 0.083% IN NEBU
2.5000 mg | INHALATION_SOLUTION | Freq: Four times a day (QID) | RESPIRATORY_TRACT | Status: DC
  Administered 2022-10-16 – 2022-10-17 (×3): 2.5 mg via RESPIRATORY_TRACT
  Filled 2022-10-16 (×3): qty 3

## 2022-10-16 MED ORDER — HYDRALAZINE HCL 20 MG/ML IJ SOLN: 10.0000 mg | Freq: Four times a day (QID) | INTRAMUSCULAR | Status: DC | PRN

## 2022-10-16 NOTE — Progress Notes (Signed)
This RN gave report to Vernona Rieger, RN on 6E.

## 2022-10-16 NOTE — Telephone Encounter (Signed)
This RN called and spoke with pt's wife per request of Kaitlyn W., PA-C. Patient's wife verbalized understanding that it would be best for patient to come back to [facility] today instead of tomorrow in order to be direct admitted per recommendation of GI specialist. Appointments changed- patient's wife verbalized thanks and understanding. She also stated that she has been taking Mr. Tambe BP at home this morning and it was "low" so she wanted him to be evaluated sooner anyway.
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
## 2022-10-16 NOTE — Progress Notes (Signed)
Report given to [place] on 4W.
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
## 2022-10-17 ENCOUNTER — Ambulatory Visit: Payer: Medicare Other

## 2022-10-17 ENCOUNTER — Observation Stay (HOSPITAL_COMMUNITY): Payer: No Typology Code available for payment source

## 2022-10-17 ENCOUNTER — Encounter: Payer: Medicare Other | Admitting: Physician Assistant

## 2022-10-17 ENCOUNTER — Other Ambulatory Visit: Payer: Medicare Other

## 2022-10-17 ENCOUNTER — Encounter (HOSPITAL_COMMUNITY): Payer: Self-pay | Admitting: Internal Medicine

## 2022-10-17 ENCOUNTER — Inpatient Hospital Stay (HOSPITAL_COMMUNITY): Payer: No Typology Code available for payment source

## 2022-10-17 DIAGNOSIS — K31819 Angiodysplasia of stomach and duodenum without bleeding: Secondary | ICD-10-CM | POA: Diagnosis not present

## 2022-10-17 DIAGNOSIS — D62 Acute posthemorrhagic anemia: Secondary | ICD-10-CM

## 2022-10-17 DIAGNOSIS — E7800 Pure hypercholesterolemia, unspecified: Secondary | ICD-10-CM | POA: Diagnosis present

## 2022-10-17 DIAGNOSIS — K3189 Other diseases of stomach and duodenum: Secondary | ICD-10-CM | POA: Diagnosis not present

## 2022-10-17 DIAGNOSIS — I8510 Secondary esophageal varices without bleeding: Secondary | ICD-10-CM | POA: Diagnosis not present

## 2022-10-17 DIAGNOSIS — E43 Unspecified severe protein-calorie malnutrition: Secondary | ICD-10-CM | POA: Diagnosis present

## 2022-10-17 DIAGNOSIS — K7460 Unspecified cirrhosis of liver: Secondary | ICD-10-CM | POA: Diagnosis present

## 2022-10-17 DIAGNOSIS — Z7984 Long term (current) use of oral hypoglycemic drugs: Secondary | ICD-10-CM | POA: Diagnosis not present

## 2022-10-17 DIAGNOSIS — K766 Portal hypertension: Secondary | ICD-10-CM | POA: Diagnosis present

## 2022-10-17 DIAGNOSIS — K254 Chronic or unspecified gastric ulcer with hemorrhage: Secondary | ICD-10-CM | POA: Diagnosis not present

## 2022-10-17 DIAGNOSIS — K31811 Angiodysplasia of stomach and duodenum with bleeding: Secondary | ICD-10-CM | POA: Diagnosis present

## 2022-10-17 DIAGNOSIS — K922 Gastrointestinal hemorrhage, unspecified: Secondary | ICD-10-CM | POA: Diagnosis not present

## 2022-10-17 DIAGNOSIS — Z794 Long term (current) use of insulin: Secondary | ICD-10-CM | POA: Diagnosis not present

## 2022-10-17 DIAGNOSIS — I81 Portal vein thrombosis: Secondary | ICD-10-CM | POA: Diagnosis present

## 2022-10-17 DIAGNOSIS — Z539 Procedure and treatment not carried out, unspecified reason: Secondary | ICD-10-CM | POA: Diagnosis present

## 2022-10-17 DIAGNOSIS — R319 Hematuria, unspecified: Secondary | ICD-10-CM | POA: Diagnosis present

## 2022-10-17 DIAGNOSIS — I8500 Esophageal varices without bleeding: Secondary | ICD-10-CM | POA: Diagnosis not present

## 2022-10-17 DIAGNOSIS — D696 Thrombocytopenia, unspecified: Secondary | ICD-10-CM | POA: Diagnosis present

## 2022-10-17 DIAGNOSIS — Z7901 Long term (current) use of anticoagulants: Secondary | ICD-10-CM | POA: Diagnosis not present

## 2022-10-17 DIAGNOSIS — B182 Chronic viral hepatitis C: Secondary | ICD-10-CM | POA: Diagnosis present

## 2022-10-17 DIAGNOSIS — K921 Melena: Secondary | ICD-10-CM | POA: Diagnosis not present

## 2022-10-17 DIAGNOSIS — E119 Type 2 diabetes mellitus without complications: Secondary | ICD-10-CM

## 2022-10-17 DIAGNOSIS — C220 Liver cell carcinoma: Secondary | ICD-10-CM | POA: Diagnosis present

## 2022-10-17 DIAGNOSIS — Z79899 Other long term (current) drug therapy: Secondary | ICD-10-CM | POA: Diagnosis not present

## 2022-10-17 DIAGNOSIS — Z800 Family history of malignant neoplasm of digestive organs: Secondary | ICD-10-CM | POA: Diagnosis not present

## 2022-10-17 DIAGNOSIS — Z87891 Personal history of nicotine dependence: Secondary | ICD-10-CM | POA: Diagnosis not present

## 2022-10-17 DIAGNOSIS — B1920 Unspecified viral hepatitis C without hepatic coma: Secondary | ICD-10-CM

## 2022-10-17 DIAGNOSIS — R109 Unspecified abdominal pain: Secondary | ICD-10-CM | POA: Diagnosis not present

## 2022-10-17 DIAGNOSIS — I8501 Esophageal varices with bleeding: Secondary | ICD-10-CM | POA: Diagnosis present

## 2022-10-17 DIAGNOSIS — Z8249 Family history of ischemic heart disease and other diseases of the circulatory system: Secondary | ICD-10-CM | POA: Diagnosis not present

## 2022-10-17 DIAGNOSIS — Z923 Personal history of irradiation: Secondary | ICD-10-CM | POA: Diagnosis not present

## 2022-10-17 DIAGNOSIS — I10 Essential (primary) hypertension: Secondary | ICD-10-CM | POA: Diagnosis present

## 2022-10-17 LAB — CBC
HCT: 25.3 % — ABNORMAL LOW (ref 39.0–52.0)
Hemoglobin: 8.2 g/dL — ABNORMAL LOW (ref 13.0–17.0)
MCH: 30.9 pg (ref 26.0–34.0)
MCHC: 32.4 g/dL (ref 30.0–36.0)
MCV: 95.5 fL (ref 80.0–100.0)
Platelets: 91 10*3/uL — ABNORMAL LOW (ref 150–400)
RBC: 2.65 MIL/uL — ABNORMAL LOW (ref 4.22–5.81)
RDW: 14.9 % (ref 11.5–15.5)
WBC: 3.5 10*3/uL — ABNORMAL LOW (ref 4.0–10.5)
nRBC: 0 % (ref 0.0–0.2)

## 2022-10-17 LAB — GLUCOSE, CAPILLARY
Glucose-Capillary: 222 mg/dL — ABNORMAL HIGH (ref 70–99)
Glucose-Capillary: 198 mg/dL — ABNORMAL HIGH (ref 70–99)
Glucose-Capillary: 172 mg/dL — ABNORMAL HIGH (ref 70–99)
Glucose-Capillary: 126 mg/dL — ABNORMAL HIGH (ref 70–99)

## 2022-10-17 LAB — BASIC METABOLIC PANEL
Anion gap: 8 (ref 5–15)
BUN: 15 mg/dL (ref 8–23)
CO2: 23 mmol/L (ref 22–32)
Calcium: 8.5 mg/dL — ABNORMAL LOW (ref 8.9–10.3)
Chloride: 108 mmol/L (ref 98–111)
Creatinine, Ser: 1.08 mg/dL (ref 0.61–1.24)
GFR, Estimated: >60 mL/min (ref >60)
Glucose, Bld: 180 mg/dL — ABNORMAL HIGH (ref 70–99)
Potassium: 3.8 mmol/L (ref 3.5–5.1)
Sodium: 139 mmol/L (ref 135–145)

## 2022-10-17 LAB — HEMOGLOBIN A1C
Hgb A1c MFr Bld: 4.8 % (ref 4.8–5.6)
Mean Plasma Glucose: 91.06 mg/dL

## 2022-10-17 MED ORDER — SODIUM CHLORIDE 0.9 % IV SOLN
510.0000 mg | Freq: Once | INTRAVENOUS | Status: AC
  Administered 2022-10-17: 510 mg via INTRAVENOUS
  Filled 2022-10-17: qty 17

## 2022-10-17 MED ORDER — SODIUM CHLORIDE 0.9% FLUSH
10.0000 mL | INTRAVENOUS | Status: DC | PRN
  Administered 2022-10-17: 10 mL

## 2022-10-17 MED ORDER — ALBUMIN HUMAN 25 % IV SOLN
12.5000 g | Freq: Once | INTRAVENOUS | Status: AC
  Administered 2022-10-17: 12.5 g via INTRAVENOUS
  Filled 2022-10-17: qty 50

## 2022-10-17 MED ORDER — BOOST PLUS PO LIQD
237.0000 mL | Freq: Three times a day (TID) | ORAL | Status: DC
  Administered 2022-10-19: 237 mL via ORAL
  Filled 2022-10-17 (×3): qty 237

## 2022-10-17 MED ORDER — CHLORHEXIDINE GLUCONATE CLOTH 2 % EX PADS
6.0000 | MEDICATED_PAD | Freq: Every day | CUTANEOUS | Status: DC
  Administered 2022-10-17 – 2022-10-19 (×3): 6 via TOPICAL

## 2022-10-17 MED ORDER — IOHEXOL 300 MG/ML  SOLN
100.0000 mL | Freq: Once | INTRAMUSCULAR | Status: AC | PRN
  Administered 2022-10-17: 100 mL via INTRAVENOUS

## 2022-10-17 MED ORDER — SODIUM CHLORIDE (PF) 0.9 % IJ SOLN
INTRAMUSCULAR | Status: AC
  Filled 2022-10-17: qty 50

## 2022-10-17 MED ORDER — ALBUTEROL SULFATE (2.5 MG/3ML) 0.083% IN NEBU
2.5000 mg | INHALATION_SOLUTION | RESPIRATORY_TRACT | Status: DC | PRN
  Filled 2022-10-17: qty 3

## 2022-10-17 NOTE — Progress Notes (Addendum)
Progress Note  Primary GI: Dr. Barron Alvine  LOS: 1 day   Chief Complaint: Melena, abdominal pain   Subjective   Patient with family at bedside, wife. Provided some of the history.  Patient has not had further bowel movements since admission. Denies abdominal pain, nausea, and vomiting.   Objective   Vital signs in last 24 hours: Temp:  [97.5 F (36.4 C)-99 F (37.2 C)] 98.5 F (36.9 C) (06/07 0601) Pulse Rate:  [80-89] 87 (06/07 0601) Resp:  [16-20] 17 (06/07 0601) BP: (120-126)/(69-77) 122/75 (06/07 0601) SpO2:  [10 %-100 %] 100 % (06/07 0601) Weight:  [59.5 kg] 59.5 kg (06/06 1600) Last BM Date : 10/16/22 Last BM recorded by nurses in past 5 days No data recorded  General:   male in no acute distress  Heart:  Regular rate and rhythm; no murmurs Pulm: Clear anteriorly; no wheezing Abdomen: soft, nondistended, normal bowel sounds in all quadrants. Nontender without guarding. No organomegaly appreciated. Extremities:  No edema Neurologic:  Alert and  oriented x4;  No focal deficits.  Psych:  Cooperative. Normal mood and affect.  Intake/Output from previous day: 06/06 0701 - 06/07 0700 In: 596.6 [P.O.:240; I.V.:255.9; IV Piggyback:100.7] Out: 500 [Urine:500] Intake/Output this shift: No intake/output data recorded.  Studies/Results: No results found.  Lab Results: Recent Labs    10/15/22 1111 10/16/22 1053 10/17/22 0319  WBC 3.9* 3.5* 3.5*  HGB 7.7* 8.9* 8.2*  HCT 22.7* 26.4* 25.3*  PLT 104* 93* 91*   BMET Recent Labs    10/15/22 1111 10/16/22 1053 10/17/22 0319  NA 139 139 139  K 4.4 3.7 3.8  CL 109 108 108  CO2 23 23 23   GLUCOSE 101* 259* 180*  BUN 21 19 15   CREATININE 0.95 1.10 1.08  CALCIUM 9.3 9.2 8.5*   LFT Recent Labs    10/16/22 1053  PROT 6.7  ALBUMIN 3.2*  AST 81*  ALT 32  ALKPHOS 137*  BILITOT 0.7   PT/INR Recent Labs    10/16/22 1803  LABPROT 15.1  INR 1.2     Scheduled Meds:  carvedilol  3.125 mg Oral Daily    Chlorhexidine Gluconate Cloth  6 each Topical Daily   feeding supplement  237 mL Oral BID BM   insulin aspart  0-5 Units Subcutaneous QHS   insulin aspart  0-9 Units Subcutaneous TID WC   morphine  15 mg Oral Q12H   pantoprazole (PROTONIX) IV  40 mg Intravenous Q12H   Continuous Infusions:  cefTRIAXone (ROCEPHIN)  IV 2 g (10/16/22 1624)   octreotide (SANDOSTATIN) 500 mcg in sodium chloride 0.9 % 250 mL (2 mcg/mL) infusion 50 mcg/hr (10/17/22 0323)      Patient profile:   Golan Sego is a 67 y.o. male with past medical history significant for History of HCV cirrhosis with HCC, now s/p palliative radiation (7/20 - 12/17/2021), then began Tecentriq and bevacizumab on 12/06/21, s/p TACE procedure 01/23/22.  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    Impression:   Upper GI bleed History of esophageal varices and GAVE - EGD 09/10/2022 with Dr. Barron Alvine shows grade 2 esophageal varices, portal hypertensive gastropathy.  GAVE treated with APC.  Normal duodenum. -Hgb 8.2, stable -WBC 3.5 -BUN 15, Cr. 1.08 - CT ab/pelvis pending Patient with history of esophageal varices and GAVE presenting with continued melena with recent  EGD showing esophageal varices and GAVE treated with APC.  Suspect patient may have chronic mild intermittent oozing from stomach resulting in continued worsening anemia and persistent melena.  Could also be esophageal variceal bleed versus GAVE despite APC treatment.   HCV cirrhosis with [facility] - s/p palliative radiation (7/20 - 12/17/2021), then began Tecentriq and bevacizumab on 12/06/21, s/p TACE procedure 01/23/22.   -Recent restaging CT on 08/25/2022 demonstrates mixed response to therapy, but also notable for PV thrombus with cavernous transformation, portal hypertensive changes with large  gastroesophageal varices, portal colopathy in the cecum.  - Follows with Dr. [name]     Plan:   Plan for EGD tomorrow. I thoroughly discussed the procedures to include nature, alternatives, benefits, and risks including but not limited to bleeding, perforation, infection, anesthesia/cardiac and pulmonary complications. Patient provides understanding and gave verbal consent to proceed. Continue Protonix IV 40mg  BID Continue clear liquid diet NPO at midnight Follow HCV RNA Follow results of CT ab/pelvis  [name]  10/17/2022, 9:49 AM    Attending physician's note   I have taken history, reviewed the chart and examined the patient. I performed a substantive portion of this encounter, including complete performance of at least one of the key components, in conjunction with the APP. I agree with the Advanced Practitioner's note, impression and recommendations.   No further bleeding. Hb 8.2, Plts 90K, INR 1.2 For EGD with APC/RFA tmr CT shows new ascites- tap ordered.  Make sure fluid is sent for cytology and cell count. HCC advanced but stable. PV thrombosis with cavernous transformation. ?Need for eliquis  MELD 3.0: 9 at 10/17/2022  3:19 AM MELD-Na: 9 at 10/17/2022  3:19 AM Calculated from: Serum Creatinine: 1.08 mg/dL at 01/10/4781  9:56 AM Serum Sodium: 139 mmol/L (Using max of 137 mmol/L) at 10/17/2022  3:19 AM Total Bilirubin: 0.7 mg/dL (Using min of 1 mg/dL) at 06/12/3084 57:84 AM Serum Albumin: 3.2 g/dL at 10/18/6293 28:41 AM INR(ratio): 1.2 at 10/16/2022  6:03 PM Age at listing (hypothetical): 42 years Sex: Male at 10/17/2022  3:19 AM     Edman Circle, MD [facility] [phone]

## 2022-10-17 NOTE — Plan of Care (Signed)

## 2022-10-17 NOTE — Inpatient Diabetes Management (Signed)
Inpatient Diabetes Program Recommendations  AACE/ADA: New Consensus Statement on Inpatient Glycemic Control (2015)  Target Ranges:  Prepandial:   less than 140 mg/dL      Peak postprandial:   less than 180 mg/dL (1-2 hours)      Critically ill patients:  140 - 180 mg/dL   Lab Results  Component Value Date   GLUCAP 198 (H) 10/17/2022   HGBA1C 4.8 10/17/2022    Review of Glycemic Control  Diabetes history: DM2 Outpatient Diabetes medications: Lantus 15 QD, metformin 850 BID, Jardiance 25 QD Current orders for Inpatient glycemic control: Novolog 0-9 units TID with meals and 0-5 HS  Inpatient Diabetes Program Recommendations:    Consider adding Semglee 8 units QD (Half home dose)  Continue to follow.  Thank you. Ailene Ards, RD, LDN, CDCES Inpatient Diabetes Coordinator [phone]

## 2022-10-17 NOTE — Progress Notes (Signed)
Patient ID: Troy [name], male   DOB: 04-17-1956, 67 y.o.   MRN: [id number] Pt presented to [place] dept today for paracentesis. On limited US abd in all four quadrants there is no significant ascites present for aspiration. Procedure cancelled. Pt notified.

## 2022-10-17 NOTE — Progress Notes (Signed)
Mobility Specialist - Progress Note   10/17/22 1526  Mobility  Activity Ambulated with assistance in hallway  Level of Assistance Modified independent, requires aide device or extra time  Assistive Device Other (Comment) (IV Pole)  Distance Ambulated (ft) 350 ft  Activity Response Tolerated well  Mobility Referral Yes  $Mobility charge 1 Mobility  Mobility Specialist Start Time (ACUTE ONLY) 0314  Mobility Specialist Stop Time (ACUTE ONLY) 0321  Mobility Specialist Time Calculation (min) (ACUTE ONLY) 7 min   Pt received in bed and agreeable to mobility. No complaints during session. Pt to bed after session with all needs met.    [facility]

## 2022-10-17 NOTE — Progress Notes (Addendum)
[facility]  PROGRESS NOTE  Stewart Sasaki UJW:[id number] DOB: December 11, 1955 DOA: 10/16/2022 PCP: [name], MD   Brief HPI:    67 y.o. male with PMH significant for DM2, HTN, HLD, HCV, liver cirrhosis with stage IV HCC, portal venous thrombosis - follows up with oncology Dr. [name] as well as [facility] because of recent episodes of GI bleeding. 08/25/2022- CT abdomen showed portal venous thrombosis with cavernous transformation, portal hypertensive changes with large GE varices, portal colopathy in the cecum. 09/10/2022-EGD by Dr. Barron Alvine, noted to have esophageal varices, portal hypertensive gastropathy, GAVE treated with APC  On 6/5, patient was seen at cancer center for dark stools, abdominal pain, low blood pressure and fatigue.  CBC showed hemoglobin down to 7.7 from 10.73 weeks ago.  Oncology.  Discussed with GI who recommended to hold Eliquis and see if patient's symptoms improve.  On 6/6 patient was again seen at cancer center continued to have low blood pressure and plan to admit to hospital.  Gastroenterology was consulted.    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  Follows up with oncologist Dr. [name] PTA on MS Contin and morphine solution for pain control.  Resume the same.    Portal vein thrombosis -Noted on CT abdomen/from 4/15 -He was started  on Eliquis, currently on hold due to above  Chronic thrombocytopenia -Platelet count is down to 91,000  Type 2 diabetes mellitus A1cis 4.8 PTA on Lantus 15 units daily, metformin, Jardiance 25 mg daily Start Lantus 10 units t and SSI/Accu-Cheks      HTN PTA on carvedilol, amlodipine, lisinopril Resume Coreg at low-dose.     Medications     carvedilol  3.125 mg Oral Daily   Chlorhexidine Gluconate Cloth  6 each Topical Daily   insulin aspart  0-5 Units Subcutaneous QHS   insulin aspart  0-9 Units Subcutaneous TID WC   [START ON 10/19/2022] lactose free nutrition  237 mL Oral TID WC   morphine  15 mg Oral Q12H   pantoprazole (PROTONIX) IV  40 mg Intravenous Q12H     Data Reviewed:   CBG:  Recent Labs  Lab 10/16/22 1629 10/16/22 2107 10/17/22 0856 10/17/22 1214 10/17/22 1636  GLUCAP 212* 334* 222* 198* 172*    SpO2: 99 %    Vitals:   10/16/22 2326 10/17/22 0145 10/17/22 0601 10/17/22 1426  BP: 123/77  122/75 116/72  Pulse: 84  87 80  Resp: 16  17 18   Temp: 98.5 F (36.9 C)  98.5 F (36.9 C) 98.1 F (36.7 C)  TempSrc: Oral  Oral Oral  SpO2: 100% 96% 100% 99%  Weight:      Height:          Data Reviewed:  Basic Metabolic Panel: Recent Labs  Lab 10/13/22 0959 10/15/22 1111 10/16/22 1053 10/17/22 0319  NA 139 139 139 139  K 4.0  4.4 3.7 3.8  CL 107 109 108 108  CO2 24 23 23 23   GLUCOSE 67* 101* 259* 180*  BUN 19 21 19 15   CREATININE 1.10 0.95 1.10 1.08  CALCIUM 9.3 9.3 9.2 8.5*    CBC: Recent Labs  Lab 10/13/22 0959 10/15/22 1111 10/16/22 1053 10/17/22 0319  WBC 3.5* 3.9* 3.5* 3.5*  NEUTROABS 2.0 2.4 2.3  --   HGB 8.2* 7.7* 8.9* 8.2*  HCT 24.5* 22.7* 26.4* 25.3*  MCV 93.2 94.2 94.0 95.5  PLT 120* 104* 93* 91*    LFT Recent Labs  Lab 10/13/22 0959 10/15/22 1111 10/16/22 1053  AST 67* 74* 81*  ALT 26 30 32  ALKPHOS 123 130* 137*  BILITOT 0.5 0.5 0.7  PROT 6.7 6.6 6.7  ALBUMIN 3.2* 3.1* 3.2*      Antibiotics: Anti-infectives (From admission, onward)    Start     Dose/Rate Route Frequency Ordered Stop   10/16/22 1600  cefTRIAXone (ROCEPHIN) 2 g in sodium chloride 0.9 % 100 mL IVPB        2 g 200 mL/hr over 30 Minutes Intravenous Daily 10/16/22 1552          DVT prophylaxis: SCDs  Code Status: Full code  Family Communication: Discussed with patient's wife at bedside   CONSULTS gastroenterology   Subjective   Patient seen and examined, denies abdominal pain.  Denies black-colored stool.   Objective    Physical Examination:   General-appears in no acute distress Heart-S1-S2, regular, no murmur auscultated Lungs-clear to auscultation bilaterally, no wheezing or crackles auscultated Abdomen-soft, nontender, no organomegaly Extremities-no edema in the lower extremities Neuro-alert, oriented x3, no focal deficit noted  Status is: Inpatient:             [name]   [facility] If 7PM-7AM, please contact night-coverage at [email or web address], Office  [phone]   10/17/2022, 6:09 PM  LOS: 1 day
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
## 2022-10-17 NOTE — Progress Notes (Signed)
Initial Nutrition Assessment  DOCUMENTATION CODES:   Severe malnutrition in context of chronic illness  INTERVENTION:  - Clear Liquid today for EGD tomorrow.  - Recommend Regular diet once medically appropriate.  - Boost Plus po TID, each supplement provides 360 kcal and 14 grams of protein - Encourage intake at all meals and of supplements.  - Monitor weight trends.    NUTRITION DIAGNOSIS:   Severe Malnutrition related to chronic illness (liver cirrhosis with stage IV HCC) as evidenced by severe fat depletion, severe muscle depletion.  GOAL:   Patient will meet greater than or equal to 90% of their needs  MONITOR:   PO intake, Supplement acceptance, Diet advancement, Weight trends  REASON FOR ASSESSMENT:   Malnutrition Screening Tool    ASSESSMENT:   67 y.o. male with PMH significant for DM2, HTN, HLD, HCV, liver cirrhosis with stage IV HCC, portal venous thrombosis who was admitted for acute on chronic blood loss anemia.   Patient reports a UBW of 165# he last weighed around March 2023. He endorses weight loss since that time due to cancer. Wife notes he dropped to as low as 123# but has been able to gain some back recently.   Per EMR, patient weighed at 151# in June 2023 and dropped to 124# by September 2023. This is a 27# or 17.9% weight loss in 3 months, which is severe for the time frame. However, weight has come back up since that time and without significant changes.  Patient endorses not eating well at home. Appetite has been poor and he has had taste changes from medications so he has only been eating 1-2 small meals a day. Wife reports patient eats a lot of chicken noodle soup. Was drinking Boost twice daily at home but felt this was too much so recently has only been having one a few times a week.   Current appetite is improved. Patient noted to be ordered a heart healthy diet but GI note today indicates plan for clear liquids with NPO tomorrow for EGD. Discussed  with RN who reached out to MD and diet now changed.   Discussed importance of ordering and trying to eat 3 meals a day. He is agreeable to receiving Boost during admission once diet advanced.    Medications reviewed and include: Insulin  Labs reviewed:  -   NUTRITION - FOCUSED PHYSICAL EXAM:  Flowsheet Row Most Recent Value  Orbital Region Mild depletion  Upper Arm Region Severe depletion  Thoracic and Lumbar Region Severe depletion  Buccal Region Mild depletion  Temple Region Severe depletion  Clavicle Bone Region Moderate depletion  Clavicle and Acromion Bone Region Moderate depletion  Scapular Bone Region Unable to assess  Dorsal Hand Moderate depletion  Patellar Region Severe depletion  Anterior Thigh Region Severe depletion  Posterior Calf Region Severe depletion  Edema (RD Assessment) None  Hair Reviewed  Eyes Reviewed  Mouth Reviewed  Skin Reviewed  Nails Reviewed       Diet Order:   Diet Order             Diet NPO time specified  Diet effective midnight           Diet clear liquid Room service appropriate? Yes; Fluid consistency: Thin  Diet effective now                   EDUCATION NEEDS:  Education needs have been addressed  Skin:  Skin Assessment: Reviewed RN Assessment  Last BM:  6/6  Height:  Ht Readings from Last 1 Encounters:  10/16/22 5\' 11"  (1.803 m)   Weight:  Wt Readings from Last 1 Encounters:  10/16/22 59.5 kg    BMI:  Body mass index is 18.28 kg/m.  Estimated Nutritional Needs:  Kcal:  1950-2150 kcals Protein:  90-105 grams Fluid:  >/= 1.9L    Shelle Iron RD, LDN For contact information, refer to [facility].

## 2022-10-17 NOTE — H&P (View-Only) (Signed)
  Progress Note  Primary GI: Dr. Cirigliano  LOS: 1 day   Chief Complaint: Melena, abdominal pain   Subjective   Patient with family at bedside, wife. Provided some of the history.  Patient has not had further bowel movements since admission. Denies abdominal pain, nausea, and vomiting.   Objective   Vital signs in last 24 hours: Temp:  [97.5 F (36.4 C)-99 F (37.2 C)] 98.5 F (36.9 C) (06/07 0601) Pulse Rate:  [80-89] 87 (06/07 0601) Resp:  [16-20] 17 (06/07 0601) BP: (120-126)/(69-77) 122/75 (06/07 0601) SpO2:  [10 %-100 %] 100 % (06/07 0601) Weight:  [59.5 kg] 59.5 kg (06/06 1600) Last BM Date : 10/16/22 Last BM recorded by nurses in past 5 days No data recorded  General:   male in no acute distress  Heart:  Regular rate and rhythm; no murmurs Pulm: Clear anteriorly; no wheezing Abdomen: soft, nondistended, normal bowel sounds in all quadrants. Nontender without guarding. No organomegaly appreciated. Extremities:  No edema Neurologic:  Alert and  oriented x4;  No focal deficits.  Psych:  Cooperative. Normal mood and affect.  Intake/Output from previous day: 06/06 0701 - 06/07 0700 In: 596.6 [P.O.:240; I.V.:255.9; IV Piggyback:100.7] Out: 500 [Urine:500] Intake/Output this shift: No intake/output data recorded.  Studies/Results: No results found.  Lab Results: Recent Labs    10/15/22 1111 10/16/22 1053 10/17/22 0319  WBC 3.9* 3.5* 3.5*  HGB 7.7* 8.9* 8.2*  HCT 22.7* 26.4* 25.3*  PLT 104* 93* 91*   BMET Recent Labs    10/15/22 1111 10/16/22 1053 10/17/22 0319  NA 139 139 139  K 4.4 3.7 3.8  CL 109 108 108  CO2 23 23 23  GLUCOSE 101* 259* 180*  BUN 21 19 15  CREATININE 0.95 1.10 1.08  CALCIUM 9.3 9.2 8.5*   LFT Recent Labs    10/16/22 1053  PROT 6.7  ALBUMIN 3.2*  AST 81*  ALT 32  ALKPHOS 137*  BILITOT 0.7   PT/INR Recent Labs    10/16/22 1803  LABPROT 15.1  INR 1.2     Scheduled Meds:  carvedilol  3.125 mg Oral Daily    Chlorhexidine Gluconate Cloth  6 each Topical Daily   feeding supplement  237 mL Oral BID BM   insulin aspart  0-5 Units Subcutaneous QHS   insulin aspart  0-9 Units Subcutaneous TID WC   morphine  15 mg Oral Q12H   pantoprazole (PROTONIX) IV  40 mg Intravenous Q12H   Continuous Infusions:  cefTRIAXone (ROCEPHIN)  IV 2 g (10/16/22 1624)   octreotide (SANDOSTATIN) 500 mcg in sodium chloride 0.9 % 250 mL (2 mcg/mL) infusion 50 mcg/hr (10/17/22 0323)      Patient profile:   Troy [name] is a 67 y.o. male with past medical history significant for History of HCV cirrhosis with HCC, now s/p palliative radiation (7/20 - 12/17/2021), then began Tecentriq and bevacizumab on 12/06/21, s/p TACE procedure 01/23/22.  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    Impression:   Upper GI bleed History of esophageal varices and GAVE - EGD 09/10/2022 with Dr. Cirigliano shows grade 2 esophageal varices, portal hypertensive gastropathy.  GAVE treated with APC.  Normal duodenum. -Hgb 8.2, stable -WBC 3.5 -BUN 15, Cr. 1.08 - CT ab/pelvis pending Patient with history of esophageal varices and GAVE presenting with continued melena with recent   EGD showing esophageal varices and GAVE treated with APC.  Suspect patient may have chronic mild intermittent oozing from stomach resulting in continued worsening anemia and persistent melena.  Could also be esophageal variceal bleed versus GAVE despite APC treatment.   HCV cirrhosis with HCC - s/p palliative radiation (7/20 - 12/17/2021), then began Tecentriq and bevacizumab on 12/06/21, s/p TACE procedure 01/23/22.   -Recent restaging CT on 08/25/2022 demonstrates mixed response to therapy, but also notable for PV thrombus with cavernous transformation, portal hypertensive changes with large  gastroesophageal varices, portal colopathy in the cecum.  - Follows with Dr. Feng     Plan:   Plan for EGD tomorrow. I thoroughly discussed the procedures to include nature, alternatives, benefits, and risks including but not limited to bleeding, perforation, infection, anesthesia/cardiac and pulmonary complications. Patient provides understanding and gave verbal consent to proceed. Continue Protonix IV 40mg BID Continue clear liquid diet NPO at midnight Follow HCV RNA Follow results of CT ab/pelvis  [name]  10/17/2022, 9:49 AM    Attending physician's note   I have taken history, reviewed the chart and examined the patient. I performed a substantive portion of this encounter, including complete performance of at least one of the key components, in conjunction with the APP. I agree with the Advanced Practitioner's note, impression and recommendations.   No further bleeding. Hb 8.2, Plts 90K, INR 1.2 For EGD with APC/RFA tmr CT shows new ascites- tap ordered.  Make sure fluid is sent for cytology and cell count. HCC advanced but stable. PV thrombosis with cavernous transformation. ?Need for eliquis  MELD 3.0: 9 at 10/17/2022  3:19 AM MELD-Na: 9 at 10/17/2022  3:19 AM Calculated from: Serum Creatinine: 1.08 mg/dL at 10/17/2022  3:19 AM Serum Sodium: 139 mmol/L (Using max of 137 mmol/L) at 10/17/2022  3:19 AM Total Bilirubin: 0.7 mg/dL (Using min of 1 mg/dL) at 10/16/2022 10:53 AM Serum Albumin: 3.2 g/dL at 10/16/2022 10:53 AM INR(ratio): 1.2 at 10/16/2022  6:03 PM Age at listing (hypothetical): 67 years Sex: Male at 10/17/2022  3:19 AM     Raj Christyna Letendre, MD  GI [phone]    

## 2022-10-18 ENCOUNTER — Inpatient Hospital Stay (HOSPITAL_COMMUNITY): Payer: No Typology Code available for payment source | Admitting: Certified Registered Nurse Anesthetist

## 2022-10-18 ENCOUNTER — Encounter (HOSPITAL_COMMUNITY)
Admission: RE | Disposition: A | Discharge status: Home / Self Care | Payer: Self-pay | Source: Ambulatory Visit | Attending: Family Medicine

## 2022-10-18 ENCOUNTER — Encounter (HOSPITAL_COMMUNITY): Payer: Self-pay | Admitting: Family Medicine

## 2022-10-18 DIAGNOSIS — I8500 Esophageal varices without bleeding: Secondary | ICD-10-CM

## 2022-10-18 DIAGNOSIS — K31819 Angiodysplasia of stomach and duodenum without bleeding: Secondary | ICD-10-CM | POA: Diagnosis not present

## 2022-10-18 DIAGNOSIS — K3189 Other diseases of stomach and duodenum: Secondary | ICD-10-CM

## 2022-10-18 DIAGNOSIS — Z87891 Personal history of nicotine dependence: Secondary | ICD-10-CM

## 2022-10-18 DIAGNOSIS — K766 Portal hypertension: Secondary | ICD-10-CM | POA: Diagnosis not present

## 2022-10-18 DIAGNOSIS — K254 Chronic or unspecified gastric ulcer with hemorrhage: Secondary | ICD-10-CM

## 2022-10-18 DIAGNOSIS — I8510 Secondary esophageal varices without bleeding: Secondary | ICD-10-CM | POA: Diagnosis not present

## 2022-10-18 DIAGNOSIS — B182 Chronic viral hepatitis C: Secondary | ICD-10-CM | POA: Diagnosis not present

## 2022-10-18 DIAGNOSIS — I10 Essential (primary) hypertension: Secondary | ICD-10-CM

## 2022-10-18 DIAGNOSIS — D62 Acute posthemorrhagic anemia: Secondary | ICD-10-CM | POA: Diagnosis not present

## 2022-10-18 HISTORY — PX: HEMOSTASIS CONTROL: SHX6838

## 2022-10-18 HISTORY — PX: ESOPHAGOGASTRODUODENOSCOPY: SHX5428

## 2022-10-18 HISTORY — PX: HEMOSTASIS CLIP PLACEMENT: SHX6857

## 2022-10-18 HISTORY — PX: GI RADIOFREQUENCY ABLATION: SHX6807

## 2022-10-18 LAB — BASIC METABOLIC PANEL
Anion gap: 10 (ref 5–15)
BUN: 9 mg/dL (ref 8–23)
CO2: 23 mmol/L (ref 22–32)
Calcium: 8.3 mg/dL — ABNORMAL LOW (ref 8.9–10.3)
Chloride: 102 mmol/L (ref 98–111)
Creatinine, Ser: 0.94 mg/dL (ref 0.61–1.24)
GFR, Estimated: >60 mL/min (ref >60)
Glucose, Bld: 137 mg/dL — ABNORMAL HIGH (ref 70–99)
Potassium: 4.3 mmol/L (ref 3.5–5.1)
Sodium: 135 mmol/L (ref 135–145)

## 2022-10-18 LAB — CBC
HCT: 23.6 % — ABNORMAL LOW (ref 39.0–52.0)
Hemoglobin: 7.8 g/dL — ABNORMAL LOW (ref 13.0–17.0)
MCH: 31.8 pg (ref 26.0–34.0)
MCHC: 33.1 g/dL (ref 30.0–36.0)
MCV: 96.3 fL (ref 80.0–100.0)
Platelets: 75 10*3/uL — ABNORMAL LOW (ref 150–400)
RBC: 2.45 MIL/uL — ABNORMAL LOW (ref 4.22–5.81)
RDW: 14.9 % (ref 11.5–15.5)
WBC: 3 10*3/uL — ABNORMAL LOW (ref 4.0–10.5)
nRBC: 0 % (ref 0.0–0.2)

## 2022-10-18 LAB — BPAM RBC: Blood Product Expiration Date: 202407042359

## 2022-10-18 LAB — GLUCOSE, CAPILLARY
Glucose-Capillary: 114 mg/dL — ABNORMAL HIGH (ref 70–99)
Glucose-Capillary: 124 mg/dL — ABNORMAL HIGH (ref 70–99)
Glucose-Capillary: 192 mg/dL — ABNORMAL HIGH (ref 70–99)
Glucose-Capillary: 180 mg/dL — ABNORMAL HIGH (ref 70–99)
Glucose-Capillary: 303 mg/dL — ABNORMAL HIGH (ref 70–99)

## 2022-10-18 LAB — PREPARE RBC (CROSSMATCH)

## 2022-10-18 LAB — TYPE AND SCREEN

## 2022-10-18 LAB — AFP TUMOR MARKER: AFP, Serum, Tumor Marker: 6.5 ng/mL (ref 0.0–8.4)

## 2022-10-18 LAB — HEMOGLOBIN AND HEMATOCRIT, BLOOD
HCT: 28.8 % — ABNORMAL LOW (ref 39.0–52.0)
Hemoglobin: 9.6 g/dL — ABNORMAL LOW (ref 13.0–17.0)

## 2022-10-18 SURGERY — EGD (ESOPHAGOGASTRODUODENOSCOPY)
Anesthesia: Monitor Anesthesia Care

## 2022-10-18 MED ORDER — PROPOFOL 500 MG/50ML IV EMUL
INTRAVENOUS | Status: DC | PRN
  Administered 2022-10-18: 100 ug/kg/min via INTRAVENOUS
  Administered 2022-10-18: 40 mg via INTRAVENOUS

## 2022-10-18 MED ORDER — CARVEDILOL 6.25 MG PO TABS
6.2500 mg | ORAL_TABLET | Freq: Two times a day (BID) | ORAL | Status: DC
  Administered 2022-10-18 – 2022-10-19 (×2): 6.25 mg via ORAL
  Filled 2022-10-18 (×2): qty 1

## 2022-10-18 MED ORDER — SUCRALFATE 1 GM/10ML PO SUSP
1.0000 g | Freq: Four times a day (QID) | ORAL | Status: DC
  Administered 2022-10-18 – 2022-10-19 (×5): 1 g via ORAL
  Filled 2022-10-18 (×5): qty 10

## 2022-10-18 MED ORDER — GLUCAGON HCL RDNA (DIAGNOSTIC) 1 MG IJ SOLR
INTRAMUSCULAR | Status: DC | PRN
  Administered 2022-10-18: .25 mg via INTRAVENOUS

## 2022-10-18 MED ORDER — LACTATED RINGERS IV SOLN
INTRAVENOUS | Status: AC | PRN
  Administered 2022-10-18: 10 mL/h via INTRAVENOUS

## 2022-10-18 MED ORDER — SODIUM CHLORIDE 0.9 % IV SOLN: INTRAVENOUS | Status: DC

## 2022-10-18 MED ORDER — SODIUM CHLORIDE 0.9% IV SOLUTION: Freq: Once | INTRAVENOUS | Status: AC

## 2022-10-18 NOTE — Progress Notes (Signed)
Mobility Specialist - Progress Note   10/18/22 1409  Mobility  Activity Ambulated with assistance in hallway  Level of Assistance Modified independent, requires aide device or extra time  Assistive Device Other (Comment) (IV Pole)  Distance Ambulated (ft) 350 ft  Range of Motion/Exercises Active  Activity Response Tolerated well  Mobility Referral Yes  $Mobility charge 1 Mobility  Mobility Specialist Start Time (ACUTE ONLY) 1358  Mobility Specialist Stop Time (ACUTE ONLY) 1408  Mobility Specialist Time Calculation (min) (ACUTE ONLY) 10 min   Pt was found in bed and agreeable to ambulate. Had no complaints during session and at EOS returned to bed with all needs met. Call bell in reach.   Billey Chang Mobility Specialist

## 2022-10-18 NOTE — Anesthesia Postprocedure Evaluation (Signed)
Anesthesia Post Note  Patient: Troy [name]  Procedure(s) Performed: ESOPHAGOGASTRODUODENOSCOPY (EGD) GI RADIOFREQUENCY ABLATION HEMOSTASIS CLIP PLACEMENT HEMOSTASIS CONTROL     Patient location during evaluation: PACU Anesthesia Type: MAC Level of consciousness: awake and alert Pain management: pain level controlled Vital Signs Assessment: post-procedure vital signs reviewed and stable Respiratory status: spontaneous breathing, nonlabored ventilation and respiratory function stable Cardiovascular status: stable and blood pressure returned to baseline Postop Assessment: no apparent nausea or vomiting Anesthetic complications: no  No notable events documented.  Last Vitals:  Vitals:   10/18/22 1030 10/18/22 1040  BP: 123/80 118/71  Pulse: 83 81  Resp: 17 12  Temp:    SpO2: 100% 99%    Last Pain:  Vitals:   10/18/22 1040  TempSrc:   PainSc: 0-No pain                 [name],W. EDMOND

## 2022-10-18 NOTE — Progress Notes (Signed)
[facility]  PROGRESS NOTE  Troy [name] HYQ:[id number] DOB: 02-07-1956 DOA: 10/16/2022 PCP: Troy Mood, MD   Brief HPI:    67 y.o. male with PMH significant for DM2, HTN, HLD, HCV, liver cirrhosis with stage IV HCC, portal venous thrombosis - follows up with oncology Dr. Mosetta [name] as well as [facility] because of recent episodes of GI bleeding. 08/25/2022- CT abdomen showed portal venous thrombosis with cavernous transformation, portal hypertensive changes with large GE varices, portal colopathy in the cecum. 09/10/2022-EGD by Dr. Barron [name], noted to have esophageal varices, portal hypertensive gastropathy, GAVE treated with APC  On 6/5, patient was seen at cancer center for dark stools, abdominal pain, low blood pressure and fatigue.  CBC showed hemoglobin down to 7.7 from 10.73 weeks ago.  Oncology.  Discussed with GI who recommended to hold Eliquis and see if patient's symptoms improve.  On 6/6 patient was again seen at cancer center continued to have low blood pressure and plan to admit to hospital.  Gastroenterology was consulted.    Assessment/Plan:    Acute GI bleed -History of esophageal varices, GAVE -S/p prior APC on recent EGD on 5/1 -Admitted with dark stools and drop in hemoglobin, GI consulted -Patient started on octreotide drip, IV Protonix -SBP prophylaxis with IV Rocephin 2 g daily -GI was consulted, patient underwent EGD this morning which showed oozing gastric ulcer which was treated.  Also had GAVE which was treated. -Recommended to continue with octreotide for next 24 hours along with IV Protonix and then switch to Protonix 40 mg p.o. twice daily along with Carafate 1 g 4 times daily for 2 weeks  Acute on chronic blood loss anemia -Baseline hemoglobin between 9 and 10 -Hemoglobin up to 7.7 yesterday -After 1 unit PRBC it improved to 8.9 -This morning Hb is down to 7.8 -Continue iron supplementation -Will transfuse 1 unit PRBC with goal to keep hemoglobin more than  8.0 g/dL.  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  Follows up with oncologist Dr. Mosetta [name] PTA on MS Contin and morphine solution for pain control.  Resume the same.    Portal vein thrombosis -Noted on CT abdomen/from 4/15 -He was started on Eliquis, currently on hold due to above -GI recommends to discontinue Eliquis, will discuss with Dr. Mosetta [name]  Chronic thrombocytopenia -Platelet count is down to 75,000  Type 2 diabetes mellitus A1cis 4.8 PTA on Lantus 15 units daily, metformin, Jardiance 25 mg daily Started Lantus 10 units t and SSI/Accu-Cheks -CBG well-controlled      HTN PTA on carvedilol, amlodipine, lisinopril Resume Coreg at regular home dose of 6.25 mg p.o. twice daily   Medications     sodium chloride   Intravenous Once   carvedilol  6.25 mg Oral BID WC   Chlorhexidine Gluconate Cloth  6 each Topical Daily   insulin aspart  0-5 Units Subcutaneous QHS   insulin aspart  0-9 Units Subcutaneous TID WC   [START ON 10/19/2022] lactose free nutrition  237 mL Oral TID WC   morphine  15 mg Oral Q12H   pantoprazole (PROTONIX) IV  40 mg Intravenous Q12H   sucralfate  1 g Oral Q6H     Data Reviewed:   CBG:  Recent Labs  Lab 10/17/22 1636 10/17/22 2056 10/18/22 0430 10/18/22 0706 10/18/22 1127  GLUCAP 172* 126* 114* 124* 192*    SpO2: 97 % O2 Flow Rate (L/min): 2 L/min  Vitals:   10/18/22 1020 10/18/22 1030 10/18/22 1040 10/18/22 1109  BP: 121/67 123/80 118/71 (!) 150/93  Pulse: 82 83 81 84  Resp: 13 17 12 16   Temp:    (!) 97.4 F (36.3 C)  TempSrc:    Oral  SpO2: 95% 100% 99% 97%  Weight:      Height:          Data Reviewed:  Basic Metabolic Panel: Recent Labs  Lab 10/13/22 0959 10/15/22 1111 10/16/22 1053 10/17/22 0319 10/18/22 0339  NA 139 139 139 139 135  K 4.0 4.4 3.7 3.8 4.3  CL 107 109 108 108 102  CO2 24 23 23 23 23    GLUCOSE 67* 101* 259* 180* 137*  BUN 19 21 19 15 9   CREATININE 1.10 0.95 1.10 1.08 0.94  CALCIUM 9.3 9.3 9.2 8.5* 8.3*    CBC: Recent Labs  Lab 10/13/22 0959 10/15/22 1111 10/16/22 1053 10/17/22 0319 10/18/22 0339  WBC 3.5* 3.9* 3.5* 3.5* 3.0*  NEUTROABS 2.0 2.4 2.3  --   --   HGB 8.2* 7.7* 8.9* 8.2* 7.8*  HCT 24.5* 22.7* 26.4* 25.3* 23.6*  MCV 93.2 94.2 94.0 95.5 96.3  PLT 120* 104* 93* 91* 75*    LFT Recent Labs  Lab 10/13/22 0959 10/15/22 1111 10/16/22 1053  AST 67* 74* 81*  ALT 26 30 32  ALKPHOS 123 130* 137*  BILITOT 0.5 0.5 0.7  PROT 6.7 6.6 6.7  ALBUMIN 3.2* 3.1* 3.2*     Antibiotics: Anti-infectives (From admission, onward)    Start     Dose/Rate Route Frequency Ordered Stop   10/16/22 1600  cefTRIAXone (ROCEPHIN) 2 g in sodium chloride 0.9 % 100 mL IVPB  Status:  Discontinued        2 g 200 mL/hr over 30 Minutes Intravenous Daily 10/16/22 1552 10/18/22 1114        DVT prophylaxis: SCDs  Code Status: Full code  Family Communication: Discussed with patient's wife at bedside   CONSULTS gastroenterology   Subjective   Patient seen and examined, came back from EGD.  Denies any pain.  Objective    Physical Examination:  General-appears in no acute distress Heart-S1-S2, regular, no murmur auscultated Lungs-clear to auscultation bilaterally, no wheezing or crackles auscultated Abdomen-soft, nontender, no organomegaly Extremities-no edema in the lower extremities Neuro-alert, oriented x3, no focal deficit noted   Status is: Inpatient:          [name]   [facility] If 7PM-7AM, please contact night-coverage at [email or web address], Office  [phone]   10/18/2022, 1:16 PM  LOS: 2 days

## 2022-10-18 NOTE — Op Note (Addendum)
[facility] Patient Name: Troy [name] Procedure Date: 10/18/2022 MRN: [id number] Attending MD: Lynann Bologna , MD, [id number] Date of Birth: January 23, 1956 CSN: [id number] Age: 67 Admit Type: Inpatient Procedure:                Upper GI endoscopy Indications:              UGI bleed (GAVE vs EV). Hb 7.7 s/p 1U to 8                            (baseline 9). Last dose of eliquis 6/4. Last recent                            EGD 09/2022 with GAVE s/p APC, gdII EV, pHTN                           HCV cirrhosis with pHTN.                           Advanced Stage IV HCC with LNs/PV thrombosis/IVC                            invasion Providers:                Lynann Bologna, MD, Roselie Awkward, RN, Kandice Robinsons, Technician Referring MD:              Medicines:                Monitored Anesthesia Care Complications:            No immediate complications. Estimated Blood Loss:     Estimated blood loss was minimal. Procedure:                Pre-Anesthesia Assessment:                           - Prior to the procedure, a History and Physical                            was performed, and patient medications and                            allergies were reviewed. The patient's tolerance of                            previous anesthesia was also reviewed. The risks                            and benefits of the procedure and the sedation                            options and risks were discussed with the patient.  All questions were answered, and informed consent                            was obtained. Prior Anticoagulants: The patient has                            taken Eliquis (apixaban), last dose was 3 days                            prior to procedure. ASA Grade Assessment: IV - A                            patient with severe systemic disease that is a                            constant threat to life. After reviewing the risks                             and benefits, the patient was deemed in                            satisfactory condition to undergo the procedure.                           After obtaining informed consent, the endoscope was                            passed under direct vision. Throughout the                            procedure, the patient's blood pressure, pulse, and                            oxygen saturations were monitored continuously. The                            GIF-H190 ([id number]) Olympus endoscope was introduced                            through the mouth, and advanced to the second part                            of duodenum. The upper GI endoscopy was                            accomplished without difficulty. The patient                            tolerated the procedure well. Scope In: Scope Out: Findings:      Grade II varices were found in the middle third of the esophagus and in       the lower third of the esophagus. They were 5 mm in largest diameter.       Not bleeding.  Hence, we decided to hold off on banding      One linear gastric ulcer with oozing hemorrhage (Forrest Class Ib) was       found at the incisura (post-APC). The lesion was 6 mm in largest       dimension. For hemostasis, two hemostatic clips were successfully placed       (MR conditional). There was no bleeding at the end of the procedure.       Purastat was applied thereafter      Moderate gastric antral vascular ectasia was present in the gastric       antrum and in the prepyloric region of the stomach. This was treated       with RFA using Barrx (RFA) endoscopic catheter for 28 treatments at       12J/cm2 (48Watts). The mucosa was very friable and started oozing from 2       different spots. These were treated with 1 hemostatic clip each with       resultant control of bleeding. We decided to hold off on further RFA       treatments at this time. Purastat was applied to the area as well. There        was no bleeding at the end of procedure. Few lesions of GAVE do remain.      Mild portal hypertensive gastropathy was found in the gastric fundus and       in the gastric body. No fundal varices      The examined duodenum was normal except for mild portal hypertensive       duodenopathy. Impression:               - Grade II esophageal varices.                           - Oozing gastric ulcer with oozing hemorrhage                            (Forrest Class Ib). Clips (MR conditional) were                            placed followed by Purastat.                           - Gastric antral vascular ectasia. Treated with                            radiofrequency ablation/clips/purastat.                           - Portal hypertensive gastropathy. Moderate Sedation:      Not Applicable - Patient had care per Anesthesia. Recommendation:           - Return patient to hospital ward for ongoing care.                           - Clear liquid diet today.                           - Continue present medications including  octreotide/IV Protonix x 24hrs.                           - I would recommend D/C Eliquis (chronic PV                            thrombosis/very high risk of bleeding/significant                            varices/patient on Avastin/may not be indicated for                            tumor thrombus). Will run in by Dr [name]                           - Transfuse 1 unit PRBC as recommended by hematology                           - Trend CBC. keep Hb>8                           - Would maximize Coreg.                           - Protonix 40 mg p.o. twice daily                           - Carafate elixir 1 g p.o. 4 times daily x 2 weeks                           - FU in GI as outpt                           - The findings and recommendations were discussed                            with the patient's family.                           - The findings and  recommendations were discussed                            with the patient's primary physician. Procedure Code(s):        --- Professional ---                           [id number], Esophagogastroduodenoscopy, flexible,                            transoral; with ablation of tumor(s), polyp(s), or                            other lesion(s) (includes pre- and post-dilation  and guide wire passage, when performed)                           43255, 59, Esophagogastroduodenoscopy, flexible,                            transoral; with control of bleeding, any method Diagnosis Code(s):        --- Professional ---                           I85.00, Esophageal varices without bleeding                           K25.4, Chronic or unspecified gastric ulcer with                            hemorrhage                           K31.819, Angiodysplasia of stomach and duodenum                            without bleeding                           K76.6, Portal hypertension                           K31.89, Other diseases of stomach and duodenum                           K92.1, Melena (includes Hematochezia) CPT copyright 2022 [employer]. All rights reserved. The codes documented in this report are preliminary and upon coder review may  be revised to meet current compliance requirements. Lynann Bologna, MD 10/18/2022 10:36:03 AM This report has been signed electronically. Number of Addenda: 0

## 2022-10-18 NOTE — Transfer of Care (Signed)
Immediate Anesthesia Transfer of Care Note  Patient: Troy [name]  Procedure(s) Performed: Procedure(s) with comments: ESOPHAGOGASTRODUODENOSCOPY (EGD) (N/A) GI RADIOFREQUENCY ABLATION (N/A) HEMOSTASIS CLIP PLACEMENT HEMOSTASIS CONTROL - Purastat  Patient Location: PACU and Endoscopy Unit  Anesthesia Type:MAC  Level of Consciousness: awake, alert  and oriented  Airway & Oxygen Therapy: Patient Spontanous Breathing and Patient connected to nasal cannula oxygen  Post-op Assessment: Report given to RN and Post -op Vital signs reviewed and stable  Post vital signs: Reviewed and stable  Last Vitals:  Vitals:   10/18/22 0842 10/18/22 1010  BP: 139/88 (!) 99/56  Pulse: 91 90  Resp: 11 13  Temp: 36.9 C 36.5 C  SpO2: 97% 91%    Complications: No apparent anesthesia complications

## 2022-10-18 NOTE — Anesthesia Preprocedure Evaluation (Addendum)
Anesthesia Evaluation  Patient identified by MRN, date of birth, ID band Patient awake    Reviewed: Allergy & Precautions, H&P , NPO status , Patient's Chart, lab work & pertinent test results, reviewed documented beta blocker date and time   Airway Mallampati: I  TM Distance: >3 FB Neck ROM: Full    Dental no notable dental hx. (+) Partial Upper, Partial Lower, Poor Dentition, Dental Advisory Given   Pulmonary former smoker   Pulmonary exam normal breath sounds clear to auscultation       Cardiovascular hypertension, Pt. on medications and Pt. on home beta blockers  Rhythm:Regular Rate:Normal     Neuro/Psych negative neurological ROS  negative psych ROS   GI/Hepatic Neg liver ROS,GERD  Medicated,,  Endo/Other  diabetes, Insulin Dependent, Oral Hypoglycemic Agents    Renal/GU negative Renal ROS  negative genitourinary   Musculoskeletal   Abdominal   Peds  Hematology  (+) Blood dyscrasia, anemia   Anesthesia Other Findings   Reproductive/Obstetrics negative OB ROS                             Anesthesia Physical Anesthesia Plan  ASA: 3  Anesthesia Plan: MAC   Post-op Pain Management: Minimal or no pain anticipated   Induction: Intravenous  PONV Risk Score and Plan: 1 and Propofol infusion  Airway Management Planned: Simple Face Mask and Natural Airway  Additional Equipment:   Intra-op Plan:   Post-operative Plan:   Informed Consent: I have reviewed the patients History and Physical, chart, labs and discussed the procedure including the risks, benefits and alternatives for the proposed anesthesia with the patient or authorized representative who has indicated his/her understanding and acceptance.     Dental advisory given  Plan Discussed with: CRNA  Anesthesia Plan Comments:        Anesthesia Quick Evaluation

## 2022-10-18 NOTE — Interval H&P Note (Signed)
History and Physical Interval Note:  10/18/2022 9:12 AM  Troy [name]  has presented today for surgery, with the diagnosis of GI bleed, history of varices, history of GAVE.  The various methods of treatment have been discussed with the patient and family. After consideration of risks, benefits and other options for treatment, the patient has consented to  Procedure(s): ESOPHAGOGASTRODUODENOSCOPY (EGD) (N/A) GI RADIOFREQUENCY ABLATION (N/A) as a surgical intervention.  The patient's history has been reviewed, patient examined, no change in status, stable for surgery.  I have reviewed the patient's chart and labs.  Questions were answered to the patient's satisfaction.     Lynann Bologna

## 2022-10-19 DIAGNOSIS — K922 Gastrointestinal hemorrhage, unspecified: Secondary | ICD-10-CM

## 2022-10-19 DIAGNOSIS — R109 Unspecified abdominal pain: Secondary | ICD-10-CM | POA: Diagnosis not present

## 2022-10-19 DIAGNOSIS — K921 Melena: Secondary | ICD-10-CM | POA: Diagnosis not present

## 2022-10-19 DIAGNOSIS — K7460 Unspecified cirrhosis of liver: Secondary | ICD-10-CM | POA: Diagnosis not present

## 2022-10-19 DIAGNOSIS — K766 Portal hypertension: Secondary | ICD-10-CM | POA: Diagnosis not present

## 2022-10-19 DIAGNOSIS — B182 Chronic viral hepatitis C: Secondary | ICD-10-CM | POA: Diagnosis not present

## 2022-10-19 DIAGNOSIS — B1920 Unspecified viral hepatitis C without hepatic coma: Secondary | ICD-10-CM | POA: Diagnosis not present

## 2022-10-19 DIAGNOSIS — D62 Acute posthemorrhagic anemia: Secondary | ICD-10-CM | POA: Diagnosis not present

## 2022-10-19 LAB — GLUCOSE, CAPILLARY
Glucose-Capillary: 150 mg/dL — ABNORMAL HIGH (ref 70–99)
Glucose-Capillary: 280 mg/dL — ABNORMAL HIGH (ref 70–99)

## 2022-10-19 MED ORDER — LISINOPRIL 40 MG PO TABS: 20.0000 mg | ORAL_TABLET | Freq: Every day | ORAL | Status: DC

## 2022-10-19 MED ORDER — SUCRALFATE 1 GM/10ML PO SUSP: 1.0000 g | Freq: Three times a day (TID) | ORAL | 0 refills | Status: DC

## 2022-10-19 MED ORDER — HEPARIN SOD (PORK) LOCK FLUSH 100 UNIT/ML IV SOLN
500.0000 [IU] | INTRAVENOUS | Status: DC | PRN
  Filled 2022-10-19: qty 5

## 2022-10-19 MED ORDER — PANTOPRAZOLE SODIUM 40 MG PO TBEC: 40.0000 mg | DELAYED_RELEASE_TABLET | Freq: Two times a day (BID) | ORAL | 2 refills | Status: DC
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
## 2022-10-19 NOTE — Progress Notes (Addendum)
Progress Note  Primary GI: Dr. Barron Alvine  LOS: 3 days   Chief Complaint: GI bleed   Subjective   No family was present at the time of my evaluation.  Patient denies further bowel movements.  Denies abdominal pain, nausea, vomiting.  Tolerating clears without difficulty and requesting advance diet as he feels that would make him feel better.   Objective   Vital signs in last 24 hours: Temp:  [97.4 F (36.3 C)-99 F (37.2 C)] 99 F (37.2 C) (06/09 0436) Pulse Rate:  [81-90] 88 (06/09 0817) Resp:  [12-20] 20 (06/09 0817) BP: (99-150)/(56-93) 140/88 (06/09 0817) SpO2:  [91 %-100 %] 100 % (06/09 0817) Last BM Date : 10/16/22 Last BM recorded by nurses in past 5 days No data recorded  General:   male in no acute distress  Heart:  Regular rate and rhythm; no murmurs Pulm: Clear anteriorly; no wheezing Abdomen: soft, nondistended, normal bowel sounds in all quadrants. Nontender without guarding. No organomegaly appreciated. Extremities:  No edema Neurologic:  Alert and  oriented x4;  No focal deficits.  Psych:  Cooperative. Normal mood and affect.  Intake/Output from previous day: 06/08 0701 - 06/09 0700 In: 2247.7 [P.O.:840; I.V.:1036; Blood:371.7] Out: 700 [Urine:700] Intake/Output this shift: Total I/O In: 120 [P.O.:120] Out: 0   Studies/Results: [place] ASCITES (ABDOMEN LIMITED)  Result Date: 10/17/2022 CLINICAL DATA:  Ascites EXAM: LIMITED ABDOMEN ULTRASOUND FOR ASCITES TECHNIQUE: Limited ultrasound survey for ascites was performed in all four abdominal quadrants. COMPARISON:  CT abdomen pelvis with contrast 10/17/2022 FINDINGS: Sonographic evaluation of the abdomen reveals trace ascites, insufficient for aspiration. IMPRESSION: Trace abdominal ascites. Electronically Signed   By: Acquanetta Belling M.D.   On: 10/17/2022 15:54   US SCROTUM W/DOPPLER  Result Date: 10/17/2022 CLINICAL DATA:  Testicle lump EXAM: SCROTAL ULTRASOUND DOPPLER ULTRASOUND OF THE TESTICLES TECHNIQUE:  Complete ultrasound examination of the testicles, epididymis, and other scrotal structures was performed. Color and spectral Doppler ultrasound were also utilized to evaluate blood flow to the testicles. COMPARISON:  None Available. FINDINGS: Right testicle Measurements: 2.7 x 1.9 x 1.9 cm. No mass or microlithiasis visualized. Left testicle Measurements: 3.4 x 2.1 x 1.6 cm. No mass or microlithiasis visualized. Right epididymis:  Normal in size and appearance. Left epididymis:  Normal in size and appearance. Hydrocele:  Moderate bilateral hydroceles. Varicocele:  None visualized. Pulsed Doppler interrogation of both testes demonstrates normal low resistance arterial and venous waveforms bilaterally. IMPRESSION: 1. Moderate bilateral hydroceles. 2. Normal size and ultrasound appearance of the bilateral testicles. Arterial and venous Doppler flow is present bilaterally. Electronically Signed   By: Jearld Lesch M.D.   On: 10/17/2022 14:21    Lab Results: Recent Labs    10/16/22 1053 10/17/22 0319 10/18/22 0339 10/18/22 2044  WBC 3.5* 3.5* 3.0*  --   HGB 8.9* 8.2* 7.8* 9.6*  HCT 26.4* 25.3* 23.6* 28.8*  PLT 93* 91* 75*  --    BMET Recent Labs    10/16/22 1053 10/17/22 0319 10/18/22 0339  NA 139 139 135  K 3.7 3.8 4.3  CL 108 108 102  CO2 23 23 23   GLUCOSE 259* 180* 137*  BUN 19 15 9   CREATININE 1.10 1.08 0.94  CALCIUM 9.2 8.5* 8.3*   LFT Recent Labs    10/16/22 1053  PROT 6.7  ALBUMIN 3.2*  AST 81*  ALT 32  ALKPHOS 137*  BILITOT 0.7   PT/INR Recent Labs    10/16/22 1803  LABPROT 15.1  INR  1.2     Scheduled Meds:  carvedilol  6.25 mg Oral BID WC   Chlorhexidine Gluconate Cloth  6 each Topical Daily   insulin aspart  0-5 Units Subcutaneous QHS   insulin aspart  0-9 Units Subcutaneous TID WC   lactose free nutrition  237 mL Oral TID WC   morphine  15 mg Oral Q12H   pantoprazole (PROTONIX) IV  40 mg Intravenous Q12H   sucralfate  1 g Oral Q6H   Continuous  Infusions:  octreotide (SANDOSTATIN) 500 mcg in sodium chloride 0.9 % 250 mL (2 mcg/mL) infusion 50 mcg/hr (10/19/22 0824)      Patient profile:   Troy [name] is a 67 y.o. male with past medical history significant for History of HCV cirrhosis with HCC, now s/p palliative radiation (7/20 - 12/17/2021), then began Tecentriq and bevacizumab on 12/06/21, s/p TACE procedure 01/23/22.  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    Impression:   Upper GI bleed History of esophageal varices and GAVE -Hgb 9.6 s/p 1 unit PRBCs -BUN 9, creatinine 0.94 -CT abdomen pelvis 6/7: Infiltrative mass occupying left lobe of the liver consistent with hepatocellular carcinoma, new moderate volume ascites, cirrhosis -Paracentesis ordered but unable to complete due to no significant ascites present for aspiration - EGD: grade II varices, oozing gastric ulcer with hemorrhage, clips placed followed by purastat, GAVE treated with radiofrequency ablation/clips/purastat, portal hypertensive gastropathy  HCV cirrhosis with HCC - s/p palliative radiation (7/20 - 12/17/2021), then began Tecentriq and bevacizumab on 12/06/21, s/p TACE procedure 01/23/22.   -Recent restaging CT on 08/25/2022 demonstrates mixed response to therapy, but also notable for PV thrombus with cavernous transformation, portal hypertensive changes with large gastroesophageal varices, portal colopathy in the cecum.  - Follows with Dr. [name]     Plan:   - no further bleeding, hgb stable, EGD yesterday showed oozing ulcer treated with clips and purastat as well as GAVE treated with radiofrequency. -continue PPI twice daily - Carafate 1g po x 2 weeks - GI will sign off, set up in outpatient clinic - soft diet   Bayley Leanna Sato  10/19/2022, 8:44 AM    Attending  physician's note   I have taken history, reviewed the chart and examined the patient. I performed a substantive portion of this encounter, including complete performance of at least one of the key components, in conjunction with the APP. I agree with the Advanced Practitioner's note, impression and recommendations.   Pl see EGD note from yesterday for details  No further bleeding.  Hb 9.6 (after 1U). Tolerating p.o. well  Tolerating Coreg 6.25 mg BID (max dose). Continue Protonix twice daily Carafate 1 g p.o. 4 times daily for 2 weeks Hold Eliquis Follow-up GI clinic in 2 to 3 weeks.  He will call for appointment.  Timings for repeat EGD to be determined at follow-up. He would also follow-up with Dr. [name] Will sign off for now.    Edman Circle, MD [facility] [phone]

## 2022-10-19 NOTE — Progress Notes (Signed)
Patient discharged: Home with family  Via: Wheelchair   Discharge paperwork given: to patient and family  Reviewed with teach back  IV and telemetry disconnected  Belongings given to patient    

## 2022-10-20 ENCOUNTER — Telehealth: Payer: Self-pay | Admitting: Hematology

## 2022-10-20 LAB — TYPE AND SCREEN
ABO/RH(D): A POS
Antibody Screen: NEGATIVE
Unit division: 0

## 2022-10-20 LAB — BPAM RBC
ISSUE DATE / TIME: 202406081537
Unit Type and Rh: 6200

## 2022-10-20 NOTE — Telephone Encounter (Signed)
Contacted patient to scheduled appointments. Patient is aware of appointments that are scheduled.   

## 2022-10-22 ENCOUNTER — Encounter (HOSPITAL_COMMUNITY): Payer: Self-pay | Admitting: Gastroenterology

## 2022-10-23 ENCOUNTER — Telehealth: Payer: Self-pay

## 2022-10-23 ENCOUNTER — Other Ambulatory Visit: Payer: Self-pay

## 2022-10-23 NOTE — Telephone Encounter (Signed)
Pt's wife called stating the pt is having watery diarrhea w/some blood streaks.  Spouse stated the diarrhea has been going on since pt's d/c from the hospital on Monday.  Spouse is unable to confirm the amount of BM's the pt is having per day d/t spouse works during the day.  Spouse stated she heard the pt in the bathroom this morning and she thought the pt had the water running but later learned the pt was having a BM.  Spouse bought pt Pepto bismol for his diarrhea but later told pt not to take it d/t pt read the warning signs on the package.  Stated that the pt will need to take Imodium AD for diarrhea.  Pt's spouse also stated that the she is having n/v and not feeling very well herself.  Asked spouse if she and pt been around anyone that have been sick w/a stomach virus.  Spouse stated "NO".  Spouse also stated that the pt is not having n/v just diarrhea.  The provider for [facility] is out of the office; therefore, consulted with Palliative Care Provider who recommended that pt and spouse both go to ED for further evaluation. Since pt just recently been d/c from hospital, provider feels the pt will be better evaluated in the ED.  Spouse verbalized understanding and stated they will go now to the ED.  Notified Dr. [name].

## 2022-10-24 ENCOUNTER — Encounter: Payer: Self-pay | Admitting: Hematology

## 2022-10-24 ENCOUNTER — Inpatient Hospital Stay (HOSPITAL_BASED_OUTPATIENT_CLINIC_OR_DEPARTMENT_OTHER): Payer: No Typology Code available for payment source | Admitting: Hematology

## 2022-10-24 ENCOUNTER — Telehealth: Payer: Self-pay | Admitting: Pharmacy Technician

## 2022-10-24 ENCOUNTER — Telehealth: Payer: Self-pay | Admitting: Pharmacist

## 2022-10-24 ENCOUNTER — Other Ambulatory Visit: Payer: Self-pay

## 2022-10-24 ENCOUNTER — Ambulatory Visit: Payer: Medicare Other

## 2022-10-24 ENCOUNTER — Inpatient Hospital Stay: Payer: No Typology Code available for payment source

## 2022-10-24 VITALS — BP 124/79 | HR 79 | Temp 98.0°F | Resp 18 | Wt 131.6 lb

## 2022-10-24 DIAGNOSIS — K31811 Angiodysplasia of stomach and duodenum with bleeding: Secondary | ICD-10-CM | POA: Diagnosis not present

## 2022-10-24 DIAGNOSIS — Z87891 Personal history of nicotine dependence: Secondary | ICD-10-CM | POA: Diagnosis not present

## 2022-10-24 DIAGNOSIS — B182 Chronic viral hepatitis C: Secondary | ICD-10-CM | POA: Diagnosis not present

## 2022-10-24 DIAGNOSIS — I700 Atherosclerosis of aorta: Secondary | ICD-10-CM | POA: Diagnosis not present

## 2022-10-24 DIAGNOSIS — K7469 Other cirrhosis of liver: Secondary | ICD-10-CM | POA: Diagnosis not present

## 2022-10-24 DIAGNOSIS — B1920 Unspecified viral hepatitis C without hepatic coma: Secondary | ICD-10-CM | POA: Diagnosis not present

## 2022-10-24 DIAGNOSIS — Z8711 Personal history of peptic ulcer disease: Secondary | ICD-10-CM | POA: Diagnosis not present

## 2022-10-24 DIAGNOSIS — Z515 Encounter for palliative care: Secondary | ICD-10-CM

## 2022-10-24 DIAGNOSIS — K828 Other specified diseases of gallbladder: Secondary | ICD-10-CM | POA: Diagnosis not present

## 2022-10-24 DIAGNOSIS — K259 Gastric ulcer, unspecified as acute or chronic, without hemorrhage or perforation: Secondary | ICD-10-CM | POA: Diagnosis not present

## 2022-10-24 DIAGNOSIS — C220 Liver cell carcinoma: Secondary | ICD-10-CM

## 2022-10-24 DIAGNOSIS — E860 Dehydration: Secondary | ICD-10-CM

## 2022-10-24 DIAGNOSIS — E119 Type 2 diabetes mellitus without complications: Secondary | ICD-10-CM | POA: Diagnosis not present

## 2022-10-24 DIAGNOSIS — D62 Acute posthemorrhagic anemia: Secondary | ICD-10-CM | POA: Diagnosis not present

## 2022-10-24 DIAGNOSIS — I10 Essential (primary) hypertension: Secondary | ICD-10-CM | POA: Diagnosis not present

## 2022-10-24 DIAGNOSIS — K5900 Constipation, unspecified: Secondary | ICD-10-CM | POA: Diagnosis not present

## 2022-10-24 DIAGNOSIS — Z79620 Long term (current) use of immunosuppressive biologic: Secondary | ICD-10-CM | POA: Diagnosis not present

## 2022-10-24 DIAGNOSIS — I864 Gastric varices: Secondary | ICD-10-CM | POA: Diagnosis not present

## 2022-10-24 DIAGNOSIS — Z79899 Other long term (current) drug therapy: Secondary | ICD-10-CM | POA: Diagnosis not present

## 2022-10-24 DIAGNOSIS — I81 Portal vein thrombosis: Secondary | ICD-10-CM | POA: Diagnosis not present

## 2022-10-24 DIAGNOSIS — G893 Neoplasm related pain (acute) (chronic): Secondary | ICD-10-CM | POA: Diagnosis not present

## 2022-10-24 DIAGNOSIS — D649 Anemia, unspecified: Secondary | ICD-10-CM

## 2022-10-24 DIAGNOSIS — Z5112 Encounter for antineoplastic immunotherapy: Secondary | ICD-10-CM | POA: Diagnosis present

## 2022-10-24 DIAGNOSIS — R188 Other ascites: Secondary | ICD-10-CM | POA: Diagnosis not present

## 2022-10-24 DIAGNOSIS — Z7901 Long term (current) use of anticoagulants: Secondary | ICD-10-CM | POA: Diagnosis not present

## 2022-10-24 DIAGNOSIS — I868 Varicose veins of other specified sites: Secondary | ICD-10-CM | POA: Diagnosis not present

## 2022-10-24 LAB — CMP (CANCER CENTER ONLY)
ALT: 39 U/L (ref 0–44)
AST: 98 U/L — ABNORMAL HIGH (ref 15–41)
Albumin: 2.9 g/dL — ABNORMAL LOW (ref 3.5–5.0)
Alkaline Phosphatase: 164 U/L — ABNORMAL HIGH (ref 38–126)
Anion gap: 7 (ref 5–15)
BUN: 13 mg/dL (ref 8–23)
CO2: 24 mmol/L (ref 22–32)
Calcium: 8.9 mg/dL (ref 8.9–10.3)
Chloride: 107 mmol/L (ref 98–111)
Creatinine: 0.84 mg/dL (ref 0.61–1.24)
GFR, Estimated: >60 mL/min (ref >60)
Glucose, Bld: 207 mg/dL — ABNORMAL HIGH (ref 70–99)
Potassium: 4 mmol/L (ref 3.5–5.1)
Sodium: 138 mmol/L (ref 135–145)
Total Bilirubin: 0.7 mg/dL (ref 0.3–1.2)
Total Protein: 6.3 g/dL — ABNORMAL LOW (ref 6.5–8.1)

## 2022-10-24 LAB — CBC WITH DIFFERENTIAL (CANCER CENTER ONLY)
Abs Immature Granulocytes: 0.01 10*3/uL (ref 0.00–0.07)
Basophils Absolute: 0 10*3/uL (ref 0.0–0.1)
Basophils Relative: 1 %
Eosinophils Absolute: 0.1 10*3/uL (ref 0.0–0.5)
Eosinophils Relative: 4 %
HCT: 31.7 % — ABNORMAL LOW (ref 39.0–52.0)
Hemoglobin: 10.4 g/dL — ABNORMAL LOW (ref 13.0–17.0)
Immature Granulocytes: 0 %
Lymphocytes Relative: 20 %
Lymphs Abs: 0.6 10*3/uL — ABNORMAL LOW (ref 0.7–4.0)
MCH: 31.8 pg (ref 26.0–34.0)
MCHC: 32.8 g/dL (ref 30.0–36.0)
MCV: 96.9 fL (ref 80.0–100.0)
Monocytes Absolute: 0.5 10*3/uL (ref 0.1–1.0)
Monocytes Relative: 15 %
Neutro Abs: 1.9 10*3/uL (ref 1.7–7.7)
Neutrophils Relative %: 60 %
Platelet Count: 85 10*3/uL — ABNORMAL LOW (ref 150–400)
RBC: 3.27 MIL/uL — ABNORMAL LOW (ref 4.22–5.81)
RDW: 15.5 % (ref 11.5–15.5)
WBC Count: 3.2 10*3/uL — ABNORMAL LOW (ref 4.0–10.5)
nRBC: 0 % (ref 0.0–0.2)

## 2022-10-24 LAB — FERRITIN: Ferritin: 337 ng/mL — ABNORMAL HIGH (ref 24–336)

## 2022-10-24 MED ORDER — HEPARIN SOD (PORK) LOCK FLUSH 100 UNIT/ML IV SOLN
500.0000 [IU] | Freq: Once | INTRAVENOUS | Status: AC
  Administered 2022-10-24: 500 [IU]

## 2022-10-24 MED ORDER — LENVATINIB (8 MG DAILY DOSE) 2 X 4 MG PO CPPK: 8.0000 mg | ORAL_CAPSULE | Freq: Every day | ORAL | 3 refills | Status: DC

## 2022-10-24 MED ORDER — SODIUM CHLORIDE 0.9% FLUSH
10.0000 mL | Freq: Once | INTRAVENOUS | Status: AC
  Administered 2022-10-24: 10 mL
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
## 2022-10-24 NOTE — Telephone Encounter (Addendum)
Oral Oncology Patient Advocate Encounter  After completing a benefits investigation, it was found that patient has an active referral on file through the [facility]. Referral is valid until 11/25/22.    Required PA forms received from [facility] pharmacy.  Forms have been completed and returned to the [place] pharmacy via e-fax [phone]  I will continue to follow until final determination.   Jinger Neighbors, CPhT-Adv Oncology Pharmacy Patient Advocate [facility] Direct Number: [phone]  Fax: [phone]

## 2022-10-24 NOTE — Telephone Encounter (Addendum)
Oral Oncology Pharmacist Encounter  Received new prescription for Lenvima (lenvatinib) for the treatment of metastatic hepatocellular carcinoma, planned duration until disease progression or unacceptable drug toxicity.  CBC w/ Diff and CMP from 10/24/22 assessed, patient AST elevated at 98 U/L, but t.bili and ALT WNL. Patient with pltc of 85 K/uL. Prescription dose and frequency assessed for appropriateness. Dose reduced given that patient weight is < 60 kg.   Current medication list in Epic reviewed, DDIs with Lenvima identified: Category D drug-drug interaction between Lenvima and Ondansetron due to risk of Qtc prolongation. Patient last EKG available from 08/28/22, QTcB at that time 409 ms. If patient experiences N/V while on Lenvima would recommend using promethazine PRN.  Evaluated chart and no patient barriers to medication adherence noted.   Patient agreement for treatment documented in MD note on 10/24/22.  Called and spoke with pharmacist with the [facility]. She confirmed they can fill the Lenvima for Mr. Aldana, but since it is non-formulary we will have to complete a PA. She is faxing PA forms over to me today for completion.  Oral Oncology Clinic will continue to follow for insurance authorization, copayment issues, initial counseling and start date.  Lenord Carbo, PharmD, BCPS, [facility] Hematology/Oncology Clinical Pharmacist [facility] Oral Chemotherapy Navigation Clinics [phone] 10/24/2022 9:14 AM

## 2022-10-27 ENCOUNTER — Telehealth: Payer: Self-pay | Admitting: Hematology

## 2022-10-27 ENCOUNTER — Ambulatory Visit (HOSPITAL_COMMUNITY): Payer: No Typology Code available for payment source

## 2022-10-27 ENCOUNTER — Telehealth: Payer: Self-pay

## 2022-10-27 NOTE — Telephone Encounter (Signed)
Patient's spouse is aware of updated of appointment information

## 2022-10-27 NOTE — Telephone Encounter (Signed)
Pt wife [name] called reporting that pt did not received his [facility] Contin script. This RN called [facility] and confirmed that the medication had been delivered to pt house (address confirmed with pt wife) on June 5th. RN informed pt wife of update and Mrs.Evatt reported that she would continue to search for the medication. No further needs at this time.

## 2022-10-30 ENCOUNTER — Telehealth: Payer: Medicare Other | Admitting: Hematology

## 2022-10-30 ENCOUNTER — Encounter (HOSPITAL_COMMUNITY): Payer: Self-pay

## 2022-10-30 ENCOUNTER — Emergency Department (HOSPITAL_COMMUNITY)
Admission: EM | Admit: 2022-10-30 | Discharge: 2022-10-30 | Disposition: A | Discharge status: Home / Self Care | Payer: No Typology Code available for payment source | Attending: Emergency Medicine | Admitting: Emergency Medicine

## 2022-10-30 ENCOUNTER — Other Ambulatory Visit: Payer: Self-pay

## 2022-10-30 DIAGNOSIS — Z794 Long term (current) use of insulin: Secondary | ICD-10-CM | POA: Diagnosis not present

## 2022-10-30 DIAGNOSIS — R1030 Lower abdominal pain, unspecified: Secondary | ICD-10-CM | POA: Diagnosis not present

## 2022-10-30 DIAGNOSIS — R7401 Elevation of levels of liver transaminase levels: Secondary | ICD-10-CM | POA: Insufficient documentation

## 2022-10-30 DIAGNOSIS — K921 Melena: Secondary | ICD-10-CM | POA: Diagnosis present

## 2022-10-30 DIAGNOSIS — Z7984 Long term (current) use of oral hypoglycemic drugs: Secondary | ICD-10-CM | POA: Insufficient documentation

## 2022-10-30 DIAGNOSIS — R195 Other fecal abnormalities: Secondary | ICD-10-CM

## 2022-10-30 DIAGNOSIS — Z79899 Other long term (current) drug therapy: Secondary | ICD-10-CM | POA: Diagnosis not present

## 2022-10-30 LAB — COMPREHENSIVE METABOLIC PANEL
ALT: 51 U/L — ABNORMAL HIGH (ref 0–44)
AST: 113 U/L — ABNORMAL HIGH (ref 15–41)
Albumin: 2.9 g/dL — ABNORMAL LOW (ref 3.5–5.0)
Alkaline Phosphatase: 197 U/L — ABNORMAL HIGH (ref 38–126)
Anion gap: 11 (ref 5–15)
BUN: 16 mg/dL (ref 8–23)
CO2: 22 mmol/L (ref 22–32)
Calcium: 9.2 mg/dL (ref 8.9–10.3)
Chloride: 107 mmol/L (ref 98–111)
Creatinine, Ser: 1.05 mg/dL (ref 0.61–1.24)
GFR, Estimated: >60 mL/min (ref >60)
Glucose, Bld: 154 mg/dL — ABNORMAL HIGH (ref 70–99)
Potassium: 4.3 mmol/L (ref 3.5–5.1)
Sodium: 140 mmol/L (ref 135–145)
Total Bilirubin: 1.2 mg/dL (ref 0.3–1.2)
Total Protein: 7.1 g/dL (ref 6.5–8.1)

## 2022-10-30 LAB — CBC
HCT: 37.3 % — ABNORMAL LOW (ref 39.0–52.0)
Hemoglobin: 11.9 g/dL — ABNORMAL LOW (ref 13.0–17.0)
MCH: 31.4 pg (ref 26.0–34.0)
MCHC: 31.9 g/dL (ref 30.0–36.0)
MCV: 98.4 fL (ref 80.0–100.0)
Platelets: 101 10*3/uL — ABNORMAL LOW (ref 150–400)
RBC: 3.79 MIL/uL — ABNORMAL LOW (ref 4.22–5.81)
RDW: 16.3 % — ABNORMAL HIGH (ref 11.5–15.5)
WBC: 3.7 10*3/uL — ABNORMAL LOW (ref 4.0–10.5)
nRBC: 0 % (ref 0.0–0.2)

## 2022-10-30 LAB — TYPE AND SCREEN
ABO/RH(D): A POS
Antibody Screen: NEGATIVE

## 2022-10-30 LAB — LIPASE, BLOOD: Lipase: 35 U/L (ref 11–51)

## 2022-10-30 MED ORDER — ACETAMINOPHEN 500 MG PO TABS
1000.0000 mg | ORAL_TABLET | Freq: Once | ORAL | Status: AC
  Administered 2022-10-30: 1000 mg via ORAL
  Filled 2022-10-30: qty 2

## 2022-10-30 MED ORDER — HEPARIN SOD (PORK) LOCK FLUSH 100 UNIT/ML IV SOLN
500.0000 [IU] | Freq: Once | INTRAVENOUS | Status: AC
  Administered 2022-10-30: 500 [IU]
  Filled 2022-10-30: qty 5

## 2022-10-30 MED ORDER — SODIUM CHLORIDE 0.9 % IV BOLUS
1000.0000 mL | Freq: Once | INTRAVENOUS | Status: AC
  Administered 2022-10-30: 1000 mL via INTRAVENOUS

## 2022-10-30 MED ORDER — OXYCODONE HCL 5 MG PO TABS
5.0000 mg | ORAL_TABLET | Freq: Once | ORAL | Status: AC
  Administered 2022-10-30: 5 mg via ORAL
  Filled 2022-10-30: qty 1

## 2022-10-30 NOTE — ED Triage Notes (Signed)
Pt was d/c on 6/9, not on Monday

## 2022-10-30 NOTE — ED Notes (Signed)
Patient up to bathroom

## 2022-10-30 NOTE — ED Provider Notes (Addendum)
Troy [name] EMERGENCY DEPARTMENT AT [facility] Provider Note   CSN: [id number] Arrival date & time: 10/30/22  1478     History  Chief Complaint  Patient presents with   Abdominal Pain   Melena    Troy [name] is a 67 y.o. male.  66 yo M with a cc of dark stool and lower abdominal pain.  Patient has a history of hepatocellular carcinoma and cirrhosis and varices.  He was just in the hospital for an upper GI bleed.  He had gone home and felt like things were going well and then he noticed that the end of one of his bowel movements that looked a little bit dark.  He has trouble describing it.  Denies any overt blood in his stool.  Has had some lower abdominal discomfort and he unfortunately was unable to get his pain medications prescribed to him.  He denies any fevers denies vomiting.  Has had 3 bowel movements today.   Abdominal Pain      Home Medications Prior to Admission medications   Medication Sig Start Date End Date Taking? Authorizing Provider  acetaminophen (TYLENOL) 500 MG tablet Take 500 mg by mouth every 6 (six) hours as needed for moderate pain.    [provider]  carboxymethylcellulose (REFRESH PLUS) 0.5 % SOLN Place 1 drop into both eyes 4 (four) times daily as needed (dry eyes).    [provider]  carvedilol (COREG) 6.25 MG tablet Take 1 tablet (6.25 mg total) by mouth daily. 09/10/22 09/10/23  Cirigliano, Vito V, DO  docusate sodium (COLACE) 100 MG capsule Take 1 capsule (100 mg total) by mouth 2 (two) times daily. Patient not taking: Reported on 10/16/2022 01/23/22   Shon Hough, NP  empagliflozin (JARDIANCE) 25 MG TABS tablet Take 25 mg by mouth daily. 11/04/19   [provider]  ferrous gluconate (FERGON) 324 MG tablet Take 324 mg by mouth 2 (two) times daily with a meal. 03/17/22   [provider]  insulin glargine (LANTUS) 100 UNIT/ML injection Inject 0.15 mLs (15 Units total) into the skin daily. Patient taking  differently: Inject 15 Units into the skin daily before breakfast. 11/04/21   Lewie Chamber, MD  lenvatinib 8 mg daily dose (LENVIMA) 2 x 4 MG capsule Take 2 capsules (8 mg total) by mouth daily. 10/24/22   [name], MD  lisinopril (ZESTRIL) 40 MG tablet Take 0.5 tablets (20 mg total) by mouth daily. 10/19/22   [name], MD  metFORMIN (GLUCOPHAGE) 850 MG tablet Take 850 mg by mouth 2 (two) times daily.    [provider]  methocarbamol (ROBAXIN) 500 MG tablet Take 1 tablet (500 mg total) by mouth 2 (two) times daily as needed for muscle spasms. 09/22/22   Pollyann Samples, NP  morphine (MS CONTIN) 15 MG 12 hr tablet Take 1 tablet (15 mg total) by mouth every 12 (twelve) hours. 10/13/22   Pickenpack-Cousar, Arty Baumgartner, NP  morphine 20 MG/5ML solution Take 0.6-1.3 mLs (2.4-5.2 mg total) by mouth every 4 (four) hours as needed for pain. 08/06/22   Pickenpack-Cousar, Arty Baumgartner, NP  ondansetron (ZOFRAN) 8 MG tablet Take 1 tablet (8 mg total) by mouth every 8 (eight) hours as needed for nausea or vomiting. 09/22/22   Pollyann Samples, NP  pantoprazole (PROTONIX) 40 MG tablet Take 1 tablet (40 mg total) by mouth 2 (two) times daily before a meal. 10/19/22   Sharl Ma, Sarina Ill, MD  polyethylene glycol powder (MIRALAX)  17 GM/SCOOP powder Take 255 g by mouth daily. Patient not taking: Reported on 10/16/2022 11/26/21   Pickenpack-Cousar, Arty Baumgartner, NP  promethazine (PHENERGAN) 12.5 MG tablet Take 2 tablets (25 mg total) by mouth every 6 (six) hours as needed for refractory nausea / vomiting. 09/22/22   Pollyann Samples, NP  sennosides-docusate sodium (SENOKOT-S) 8.6-50 MG tablet Take 1 tablet by mouth 2 (two) times daily.    [provider]  sucralfate (CARAFATE) 1 GM/10ML suspension Take 10 mLs (1 g total) by mouth 4 (four) times daily -  with meals and at bedtime for 14 days. 10/19/22 11/02/22  [name], MD  VIAGRA 100 MG tablet Take 100 mg by mouth as needed for erectile dysfunction. Patient not taking:  Reported on 10/16/2022 11/29/19 02/07/23  [provider]  prochlorperazine (COMPAZINE) 10 MG tablet Take 1 tablet (10 mg total) by mouth every 6 (six) hours as needed (Nausea or vomiting). 12/03/21 01/16/22  [name], MD      Allergies    Patient has no known allergies.    Review of Systems   Review of Systems  Gastrointestinal:  Positive for abdominal pain.    Physical Exam Updated Vital Signs BP 135/84   Pulse 90   Temp 98.1 F (36.7 C) (Oral)   Resp 16   Ht 5\' 11"  (1.803 m)   Wt 59 kg   SpO2 100%   BMI 18.13 kg/m  Physical Exam Vitals and nursing note reviewed.  Constitutional:      Appearance: He is well-developed.  HENT:     Head: Normocephalic and atraumatic.  Eyes:     Pupils: Pupils are equal, round, and reactive to light.  Neck:     Vascular: No JVD.  Cardiovascular:     Rate and Rhythm: Normal rate and regular rhythm.     Heart sounds: No murmur heard.    No friction rub. No gallop.  Pulmonary:     Effort: No respiratory distress.     Breath sounds: No wheezing.  Abdominal:     General: There is no distension.     Tenderness: There is abdominal tenderness. There is no guarding or rebound.     Comments: Mild diffuse abdominal discomfort.  Musculoskeletal:        General: Normal range of motion.     Cervical back: Normal range of motion and neck supple.  Skin:    Coloration: Skin is not pale.     Findings: No rash.  Neurological:     Mental Status: He is alert and oriented to person, place, and time.  Psychiatric:        Behavior: Behavior normal.     ED Results / Procedures / Treatments   Labs (all labs ordered are listed, but only abnormal results are displayed) Labs Reviewed  COMPREHENSIVE METABOLIC PANEL - Abnormal; Notable for the following components:      Result Value   Glucose, Bld 154 (*)    Albumin 2.9 (*)    AST 113 (*)    ALT 51 (*)    Alkaline Phosphatase 197 (*)    All other components within normal limits  CBC -  Abnormal; Notable for the following components:   WBC 3.7 (*)    RBC 3.79 (*)    Hemoglobin 11.9 (*)    HCT 37.3 (*)    RDW 16.3 (*)    Platelets 101 (*)    All other components within normal limits  LIPASE, BLOOD  POC OCCULT BLOOD, ED  TYPE AND SCREEN    EKG None  Radiology No results found.  Procedures Procedures    Medications Ordered in ED Medications  sodium chloride 0.9 % bolus 1,000 mL (1,000 mLs Intravenous New Bag/Given 10/30/22 1954)  acetaminophen (TYLENOL) tablet 1,000 mg (1,000 mg Oral Given 10/30/22 1945)  oxyCODONE (Oxy IR/ROXICODONE) immediate release tablet 5 mg (5 mg Oral Given 10/30/22 1944)    ED Course/ Medical Decision Making/ A&P                             Medical Decision Making Amount and/or Complexity of Data Reviewed Labs: ordered.  Risk OTC drugs. Prescription drug management.   67 yo M with a chief complaints of dark stool.  He noticed this at the end of one of his bowel movements.  Said otherwise looks brown.  He has had some lower abdominal discomfort.  He was just in the hospital for an upper GI bleed had a endoscopic procedure for that.  His hemoglobin today is actually higher than it has been.  Family is concerned that maybe he is dehydrated we will give a bag of IV fluids.  Will discuss with GI.  I discussed the case with Dr. Adela Lank, [facility] gastroenterology based on my description of the patient's history and exam he thought this was unlikely to be acute bleeding and felt reasonable for outpatient follow-up.  No significant elevation to his BUN.  Mild LFT elevation was consistent with prior.  8:36 PM:  I have discussed the diagnosis/risks/treatment options with the patient.  Evaluation and diagnostic testing in the emergency department does not suggest an emergent condition requiring admission or immediate intervention beyond what has been performed at this time.  They will follow up with PCP, GI. We also discussed returning to the ED  immediately if new or worsening sx occur. We discussed the sx which are most concerning (e.g., sudden worsening pain, fever, inability to tolerate by mouth) that necessitate immediate return. Medications administered to the patient during their visit and any new prescriptions provided to the patient are listed below.  Medications given during this visit Medications  sodium chloride 0.9 % bolus 1,000 mL (1,000 mLs Intravenous New Bag/Given 10/30/22 1954)  acetaminophen (TYLENOL) tablet 1,000 mg (1,000 mg Oral Given 10/30/22 1945)  oxyCODONE (Oxy IR/ROXICODONE) immediate release tablet 5 mg (5 mg Oral Given 10/30/22 1944)     The patient appears reasonably screen and/or stabilized for discharge and I doubt any other medical condition or other [facility] requiring further screening, evaluation, or treatment in the ED at this time prior to discharge.          Final Clinical Impression(s) / ED Diagnoses Final diagnoses:  Dark stools    Rx / DC Orders ED Discharge Orders     None         Melene Plan, DO 10/30/22 2036    Melene Plan, DO 10/30/22 2036

## 2022-10-30 NOTE — Discharge Instructions (Signed)
Your lab work today is reassuring.  Does not mean that nothing is wrong.  Please return for worsening dark stools or if you notice bright red blood.  Or if you feel more weak.  Please follow-up with your gastroenterologist in the office.

## 2022-10-30 NOTE — ED Triage Notes (Signed)
Lower abdominal pain with dark stools that started today. Fatigue and weakness. Decreased appetite and poor PO intake.  Recently admitted for GI bleed, discharged on Monday.

## 2022-10-31 ENCOUNTER — Telehealth: Payer: Self-pay

## 2022-10-31 DIAGNOSIS — K31819 Angiodysplasia of stomach and duodenum without bleeding: Secondary | ICD-10-CM

## 2022-10-31 DIAGNOSIS — R195 Other fecal abnormalities: Secondary | ICD-10-CM

## 2022-10-31 NOTE — Telephone Encounter (Signed)
1-week lab order and reminder in epic.
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
## 2022-10-31 NOTE — Telephone Encounter (Signed)
-----   Message from [facility] V, DO sent at 10/31/2022  7:52 AM EDT ----- Regarding: RE: outpatient follow up lab Thanks Brett Canales, will get him set up.   Zackariah Vanderpol, go ahead and order the CBC under my name and will discuss arranging follow-up based on results and overall care plan.   VC  ----- Message ----- From: Benancio Deeds, MD Sent: 10/30/2022  10:02 PM EDT To: Shellia Cleverly, DO; Missy Sabins, RN Subject: outpatient follow up lab                       Higinio Roger,  I think this is your outpatient? Came to the ED tonight with one dark stool, followed by nondark stools. Hgb is higher than previous, BUN normal. He was scoped by Elby Showers on 6/8 who treated an ulcer and GAVE. He has been taking a lot of carafate since then and I wonder if this caused the dark stools. Nothing on DRE per the ED. They are going to send him home but would be good to check a CBC on him next week to make sure stable. I don't see any outpatient follow up scheduled but he has been following with oncology/palliative care.   Meiah Zamudio can you contact the patient tomorrow to make sure he is going okay and arrange a follow up CBC next week? Thanks  Brett Canales

## 2022-11-04 ENCOUNTER — Other Ambulatory Visit: Payer: Self-pay

## 2022-11-04 ENCOUNTER — Encounter: Payer: Self-pay | Admitting: Nurse Practitioner

## 2022-11-04 ENCOUNTER — Ambulatory Visit: Payer: Medicare Other

## 2022-11-04 ENCOUNTER — Ambulatory Visit: Payer: Medicare Other | Admitting: Nurse Practitioner

## 2022-11-04 ENCOUNTER — Inpatient Hospital Stay (HOSPITAL_BASED_OUTPATIENT_CLINIC_OR_DEPARTMENT_OTHER): Payer: No Typology Code available for payment source | Admitting: Nurse Practitioner

## 2022-11-04 ENCOUNTER — Inpatient Hospital Stay: Payer: No Typology Code available for payment source

## 2022-11-04 ENCOUNTER — Inpatient Hospital Stay: Payer: No Typology Code available for payment source | Admitting: Dietician

## 2022-11-04 VITALS — BP 141/91 | HR 87 | Temp 98.2°F | Resp 18 | Wt 131.0 lb

## 2022-11-04 DIAGNOSIS — Z515 Encounter for palliative care: Secondary | ICD-10-CM | POA: Diagnosis not present

## 2022-11-04 DIAGNOSIS — D649 Anemia, unspecified: Secondary | ICD-10-CM

## 2022-11-04 DIAGNOSIS — G893 Neoplasm related pain (acute) (chronic): Secondary | ICD-10-CM | POA: Diagnosis not present

## 2022-11-04 DIAGNOSIS — E860 Dehydration: Secondary | ICD-10-CM

## 2022-11-04 DIAGNOSIS — C220 Liver cell carcinoma: Secondary | ICD-10-CM

## 2022-11-04 DIAGNOSIS — R530 Neoplastic (malignant) related fatigue: Secondary | ICD-10-CM | POA: Diagnosis not present

## 2022-11-04 DIAGNOSIS — Z7189 Other specified counseling: Secondary | ICD-10-CM

## 2022-11-04 DIAGNOSIS — R630 Anorexia: Secondary | ICD-10-CM

## 2022-11-04 DIAGNOSIS — Z5112 Encounter for antineoplastic immunotherapy: Secondary | ICD-10-CM | POA: Diagnosis not present

## 2022-11-04 LAB — CBC WITH DIFFERENTIAL (CANCER CENTER ONLY)
Abs Immature Granulocytes: 0.01 10*3/uL (ref 0.00–0.07)
Basophils Absolute: 0 10*3/uL (ref 0.0–0.1)
Basophils Relative: 1 %
Eosinophils Absolute: 0.1 10*3/uL (ref 0.0–0.5)
Eosinophils Relative: 3 %
HCT: 35.2 % — ABNORMAL LOW (ref 39.0–52.0)
Hemoglobin: 11.6 g/dL — ABNORMAL LOW (ref 13.0–17.0)
Immature Granulocytes: 0 %
Lymphocytes Relative: 18 %
Lymphs Abs: 0.6 10*3/uL — ABNORMAL LOW (ref 0.7–4.0)
MCH: 31.8 pg (ref 26.0–34.0)
MCHC: 33 g/dL (ref 30.0–36.0)
MCV: 96.4 fL (ref 80.0–100.0)
Monocytes Absolute: 0.4 10*3/uL (ref 0.1–1.0)
Monocytes Relative: 13 %
Neutro Abs: 2 10*3/uL (ref 1.7–7.7)
Neutrophils Relative %: 65 %
Platelet Count: 101 10*3/uL — ABNORMAL LOW (ref 150–400)
RBC: 3.65 MIL/uL — ABNORMAL LOW (ref 4.22–5.81)
RDW: 15.5 % (ref 11.5–15.5)
WBC Count: 3.1 10*3/uL — ABNORMAL LOW (ref 4.0–10.5)
nRBC: 0 % (ref 0.0–0.2)

## 2022-11-04 LAB — CMP (CANCER CENTER ONLY)
ALT: 36 U/L (ref 0–44)
AST: 84 U/L — ABNORMAL HIGH (ref 15–41)
Albumin: 3.2 g/dL — ABNORMAL LOW (ref 3.5–5.0)
Alkaline Phosphatase: 209 U/L — ABNORMAL HIGH (ref 38–126)
Anion gap: 7 (ref 5–15)
BUN: 18 mg/dL (ref 8–23)
CO2: 24 mmol/L (ref 22–32)
Calcium: 9.5 mg/dL (ref 8.9–10.3)
Chloride: 107 mmol/L (ref 98–111)
Creatinine: 0.99 mg/dL (ref 0.61–1.24)
GFR, Estimated: >60 mL/min (ref >60)
Glucose, Bld: 240 mg/dL — ABNORMAL HIGH (ref 70–99)
Potassium: 3.6 mmol/L (ref 3.5–5.1)
Sodium: 138 mmol/L (ref 135–145)
Total Bilirubin: 0.9 mg/dL (ref 0.3–1.2)
Total Protein: 7.5 g/dL (ref 6.5–8.1)

## 2022-11-04 MED ORDER — HEPARIN SOD (PORK) LOCK FLUSH 100 UNIT/ML IV SOLN: 500.0000 [IU] | Freq: Once | INTRAVENOUS | Status: DC

## 2022-11-04 MED ORDER — SODIUM CHLORIDE 0.9% FLUSH
10.0000 mL | Freq: Once | INTRAVENOUS | Status: AC
  Administered 2022-11-04: 10 mL via INTRAVENOUS

## 2022-11-04 MED ORDER — MORPHINE SULFATE ER 15 MG PO TBCR
15.0000 mg | EXTENDED_RELEASE_TABLET | Freq: Two times a day (BID) | ORAL | 0 refills | Status: DC
Start: 2022-11-04 — End: 2023-02-24

## 2022-11-04 MED ORDER — HEPARIN SOD (PORK) LOCK FLUSH 100 UNIT/ML IV SOLN
500.0000 [IU] | Freq: Once | INTRAVENOUS | Status: AC
  Administered 2022-11-04: 500 [IU] via INTRAVENOUS

## 2022-11-04 MED ORDER — MORPHINE SULFATE 20 MG/5ML PO SOLN
2.5000 mg | ORAL | 0 refills | Status: DC | PRN
Start: 2022-11-04 — End: 2023-02-24

## 2022-11-04 MED ORDER — SODIUM CHLORIDE 0.9% FLUSH
10.0000 mL | Freq: Once | INTRAVENOUS | Status: AC
  Administered 2022-11-04: 10 mL

## 2022-11-04 NOTE — Progress Notes (Signed)
Nutrition Follow-up:  Patient with progression of HCC. He is currently receiving oral chemotherapy with daily Lenvima.   Met with patient and wife in office. Patient reports increased joint/bone pain. He did not receive pain medications from [place] that were sent via mail. Patient report decreased appetite and off taste of foods. Recalls brushing teeth once daily. Patient endorses poor dentition. States food won't do any good if unable to chew. Wife reports mechanical soft diet at home and particular about food choices. He has been trying to eat more greens. Patient states he has stopped eating pork and is tired of eating chicken. He is drinking Ensure/Boost at home. Patient had to take a break from supplements for a while as he got tired of drinking them. He only likes the chocolate flavor. Patient has intermittent nausea. This is well controlled with antiemetics.    Medications: reviewed   Labs: glucose 240, albumin 3.2  Anthropometrics: Wt 131 lb today - stable x 1 month  6/14 - 131 lb 9.6 oz 6/5 - 129 lb 11.2 oz 5/13 - 133 lb   NUTRITION DIAGNOSIS: Unintended weight loss stable   INTERVENTION:  Discussed strategies for poor appetite, encouraged small frequent meals/snacks (eating by the clock) with adequate calories and protein - snack ideas provided  Reviewed foods with protein, educated to include protein source at every meal. Encouraged soft moist high protein foods for ease of intake - handout with ideas provided Discussed strategies for altered taste and importance of oral care. Recommend baking soda salt water rinses several times daily and before meals - handout with tips + recipe given Continue antiemetics as needed for nausea Recommend drinking 2-3 Ensure Plus HP/equivalent daily for weight maintenance/gain Provided chocolate samples of Boost HP, Ensure Complete, Ensure Plus. Encouraged pt to inquire about receiving ONS from [place] Contact information given    MONITORING,  EVALUATION, GOAL: weight trends, intake   NEXT VISIT: To be scheduled as needed

## 2022-11-05 ENCOUNTER — Encounter: Payer: Self-pay | Admitting: Hematology

## 2022-11-05 LAB — FERRITIN: Ferritin: 150 ng/mL (ref 24–336)

## 2022-11-07 ENCOUNTER — Telehealth: Payer: Self-pay | Admitting: *Deleted

## 2022-11-07 NOTE — Telephone Encounter (Signed)
-----   Message from Missy Sabins, RN sent at 11/07/2022  9:22 AM EDT ----- Regarding: FW: 1-week lab reminder  ----- Message ----- From: Missy Sabins, RN Sent: 11/07/2022  12:00 AM EDT To: Missy Sabins, RN Subject: 1-week lab reminder                            CBC - order is in epic

## 2022-11-07 NOTE — Telephone Encounter (Signed)
Patient called to remind of lab draw next week. Spoke with wife and informed of the lab draw for CBC. Address and location of lab given to wife, as well as hours of operation. Wife states she will make sure patient gets to the lab.

## 2022-11-07 NOTE — Telephone Encounter (Signed)
-----   Message from Brooklyn N Powell, RN sent at 11/07/2022  9:22 AM EDT ----- Regarding: FW: 1-week lab reminder  ----- Message ----- From: Powell, Brooklyn N, RN Sent: 11/07/2022  12:00 AM EDT To: Brooklyn N Powell, RN Subject: 1-week lab reminder                            CBC - order is in epic   

## 2022-11-10 NOTE — Telephone Encounter (Signed)
Oral Chemotherapy Pharmacist Encounter   Attempted to reach patient's wife to provide update and offer for initial counseling on oral medication: Lenvima (lenvatinib).   No answer. Left voicemail for patient's wife to call back to discuss details of medication and initial counseling session.  Lenord Carbo, PharmD, BCPS, [facility] Hematology/Oncology Clinical Pharmacist [facility] and [facility] Oral Chemotherapy Navigation Clinics [phone] 11/10/2022 10:44 AM

## 2022-11-10 NOTE — Telephone Encounter (Signed)
Oral Chemotherapy Pharmacist Encounter  I spoke with patient's wife, Abdulhakeem Kenerson, for overview of: Lenvima (lenvatinib) for the treatment of metastatic hepatocellular carcinoma, planned duration until disease progression or unacceptable drug toxicity.   Counseled patient's wife on administration, dosing, side effects, monitoring, drug-food interactions, safe handling, storage, and disposal.  Patient will take Lenvima 4mg  capsules, 2 capsules (8mg ) by mouth once daily, with or without food, at approximately the same time each day.  Lenvima start date: 11/14/22  Adverse effects include but are not limited to: hypertension, hand-foot syndrome, diarrhea, fatigue, change in electrolytes. Diarrhea: Patient's wife states they already have Imodium (loperamide) on hand at home if Mr. Pinsky experiences diarrhea. Patient's wife knows to alert the office if Mr. Evelyn has 4 or more loose stools above baseline.  Patient's wife instructed to notify office of any upcoming invasive procedures. [name] will be held for 6 days prior to scheduled surgery, restart based on healing and clinical judgement.   Reviewed importance of keeping a medication schedule and plan for any missed doses. No barriers to medication adherence identified.  Medication reconciliation performed and medication/allergy list updated.  Medication is being filled through the [facility].   All questions answered.  Patient's wife voiced understanding and appreciation.   Medication education handout placed in mail for patient and patient's wife. Patient and patient's wife know to call the office with questions or concerns. Oral Chemotherapy Clinic phone number provided.   Lenord Carbo, PharmD, BCPS, BCOP Hematology/Oncology Clinical Pharmacist [facility] and [facility] Oral Chemotherapy Navigation Clinics [phone] 11/10/2022 12:15 PM

## 2022-11-14 ENCOUNTER — Ambulatory Visit (HOSPITAL_COMMUNITY)
Admission: RE | Admit: 2022-11-14 | Discharge: 2022-11-14 | Disposition: A | Discharge status: Home / Self Care | Payer: No Typology Code available for payment source | Source: Ambulatory Visit | Attending: Nurse Practitioner | Admitting: Nurse Practitioner

## 2022-11-14 ENCOUNTER — Encounter (HOSPITAL_COMMUNITY): Payer: Self-pay

## 2022-11-14 DIAGNOSIS — C220 Liver cell carcinoma: Secondary | ICD-10-CM | POA: Diagnosis present

## 2022-11-14 MED ORDER — HEPARIN SOD (PORK) LOCK FLUSH 100 UNIT/ML IV SOLN
INTRAVENOUS | Status: AC
  Filled 2022-11-14: qty 5

## 2022-11-14 MED ORDER — IOHEXOL 9 MG/ML PO SOLN
1000.0000 mL | ORAL | Status: AC
  Administered 2022-11-14: 1000 mL via ORAL

## 2022-11-14 MED ORDER — IOHEXOL 300 MG/ML  SOLN
100.0000 mL | Freq: Once | INTRAMUSCULAR | Status: AC | PRN
  Administered 2022-11-14: 100 mL via INTRAVENOUS

## 2022-11-14 MED ORDER — HEPARIN SOD (PORK) LOCK FLUSH 100 UNIT/ML IV SOLN
500.0000 [IU] | Freq: Once | INTRAVENOUS | Status: AC
  Administered 2022-11-14: 500 [IU] via INTRAVENOUS

## 2022-11-14 MED ORDER — SODIUM CHLORIDE (PF) 0.9 % IJ SOLN
INTRAMUSCULAR | Status: AC
  Filled 2022-11-14: qty 50

## 2022-11-14 MED ORDER — IOHEXOL 9 MG/ML PO SOLN
ORAL | Status: AC
  Filled 2022-11-14: qty 1000

## 2022-11-18 ENCOUNTER — Telehealth: Payer: Self-pay

## 2022-11-18 ENCOUNTER — Other Ambulatory Visit: Payer: Self-pay | Admitting: Hematology

## 2022-11-18 ENCOUNTER — Other Ambulatory Visit: Payer: Self-pay

## 2022-11-18 MED ORDER — AMLODIPINE BESYLATE 5 MG PO TABS: 5.0000 mg | ORAL_TABLET | Freq: Every day | ORAL | 0 refills | Status: DC

## 2022-11-18 NOTE — Telephone Encounter (Signed)
Patients wife called in stating that her husband started on a new medication on 7/1 Lenvima. She stated that he is having elevated bp last one being 179/1100. She wanted to know what to do about the high readings. I spoke with Dr. [name] she stated it is a side effect of the medicine she call in a low dose Amlodipine 5 mg and wants him to check bp every couple of days. Called the spouse back and told her what Dr. [name] stated and she fully understood.

## 2022-11-19 ENCOUNTER — Other Ambulatory Visit: Payer: Self-pay

## 2022-11-19 ENCOUNTER — Inpatient Hospital Stay
Payer: No Typology Code available for payment source | Attending: Hematology | Admitting: Licensed Clinical Social Worker

## 2022-11-19 DIAGNOSIS — C220 Liver cell carcinoma: Secondary | ICD-10-CM

## 2022-11-19 NOTE — Progress Notes (Signed)
[facility] CSW Progress Note  Visual merchandiser  spoke with pt's wife regarding home care.  Per spouse pt has a physical therapist coming to the house through home care that was set up with the [place], but she was told pt does not qualify for any other assistance.  Pt's spouse works and is not home with pt throughout the day.  She expressed concern regarding the fact that the home is 2 stories and she is worried pt is going to fall while she is not home.  CSW provided pt w/ contact information for Karen Kays through [employer] and sent an email to [facility] requesting a call to spouse.  CSW to remain available throughout duration of treatment to provide support as appropriate.      Rachel Moulds, LCSW Clinical Social Worker [facility]

## 2022-11-21 ENCOUNTER — Other Ambulatory Visit: Payer: Self-pay

## 2022-11-21 ENCOUNTER — Emergency Department (HOSPITAL_COMMUNITY): Payer: No Typology Code available for payment source

## 2022-11-21 ENCOUNTER — Inpatient Hospital Stay (HOSPITAL_COMMUNITY)
Admission: EM | Admit: 2022-11-21 | Discharge: 2022-11-25 | DRG: 441 | Disposition: A | Discharge status: Home Health | Payer: No Typology Code available for payment source | Attending: Internal Medicine | Admitting: Internal Medicine

## 2022-11-21 ENCOUNTER — Encounter (HOSPITAL_COMMUNITY): Payer: Self-pay | Admitting: Radiology

## 2022-11-21 DIAGNOSIS — E860 Dehydration: Secondary | ICD-10-CM

## 2022-11-21 DIAGNOSIS — Z681 Body mass index (BMI) 19 or less, adult: Secondary | ICD-10-CM

## 2022-11-21 DIAGNOSIS — K7460 Unspecified cirrhosis of liver: Secondary | ICD-10-CM | POA: Diagnosis present

## 2022-11-21 DIAGNOSIS — M19011 Primary osteoarthritis, right shoulder: Secondary | ICD-10-CM | POA: Diagnosis present

## 2022-11-21 DIAGNOSIS — C220 Liver cell carcinoma: Secondary | ICD-10-CM | POA: Diagnosis present

## 2022-11-21 DIAGNOSIS — R636 Underweight: Secondary | ICD-10-CM | POA: Diagnosis present

## 2022-11-21 DIAGNOSIS — G9341 Metabolic encephalopathy: Secondary | ICD-10-CM | POA: Diagnosis not present

## 2022-11-21 DIAGNOSIS — R531 Weakness: Secondary | ICD-10-CM | POA: Diagnosis not present

## 2022-11-21 DIAGNOSIS — K7682 Hepatic encephalopathy: Secondary | ICD-10-CM | POA: Diagnosis not present

## 2022-11-21 DIAGNOSIS — F1410 Cocaine abuse, uncomplicated: Secondary | ICD-10-CM | POA: Diagnosis present

## 2022-11-21 DIAGNOSIS — I81 Portal vein thrombosis: Secondary | ICD-10-CM | POA: Diagnosis present

## 2022-11-21 DIAGNOSIS — Z79899 Other long term (current) drug therapy: Secondary | ICD-10-CM

## 2022-11-21 DIAGNOSIS — Z800 Family history of malignant neoplasm of digestive organs: Secondary | ICD-10-CM

## 2022-11-21 DIAGNOSIS — Z8619 Personal history of other infectious and parasitic diseases: Secondary | ICD-10-CM

## 2022-11-21 DIAGNOSIS — K5900 Constipation, unspecified: Secondary | ICD-10-CM | POA: Diagnosis present

## 2022-11-21 DIAGNOSIS — E7800 Pure hypercholesterolemia, unspecified: Secondary | ICD-10-CM | POA: Diagnosis present

## 2022-11-21 DIAGNOSIS — K31819 Angiodysplasia of stomach and duodenum without bleeding: Secondary | ICD-10-CM | POA: Diagnosis present

## 2022-11-21 DIAGNOSIS — G8929 Other chronic pain: Secondary | ICD-10-CM | POA: Diagnosis present

## 2022-11-21 DIAGNOSIS — Z7984 Long term (current) use of oral hypoglycemic drugs: Secondary | ICD-10-CM

## 2022-11-21 DIAGNOSIS — E440 Moderate protein-calorie malnutrition: Secondary | ICD-10-CM

## 2022-11-21 DIAGNOSIS — E119 Type 2 diabetes mellitus without complications: Secondary | ICD-10-CM | POA: Diagnosis not present

## 2022-11-21 DIAGNOSIS — M545 Low back pain, unspecified: Secondary | ICD-10-CM | POA: Diagnosis present

## 2022-11-21 DIAGNOSIS — G893 Neoplasm related pain (acute) (chronic): Secondary | ICD-10-CM | POA: Diagnosis present

## 2022-11-21 DIAGNOSIS — Z794 Long term (current) use of insulin: Secondary | ICD-10-CM

## 2022-11-21 DIAGNOSIS — I10 Essential (primary) hypertension: Secondary | ICD-10-CM | POA: Diagnosis present

## 2022-11-21 DIAGNOSIS — M19012 Primary osteoarthritis, left shoulder: Secondary | ICD-10-CM | POA: Diagnosis present

## 2022-11-21 DIAGNOSIS — N179 Acute kidney failure, unspecified: Secondary | ICD-10-CM | POA: Diagnosis present

## 2022-11-21 DIAGNOSIS — Z8249 Family history of ischemic heart disease and other diseases of the circulatory system: Secondary | ICD-10-CM

## 2022-11-21 DIAGNOSIS — Z87891 Personal history of nicotine dependence: Secondary | ICD-10-CM

## 2022-11-21 DIAGNOSIS — E1159 Type 2 diabetes mellitus with other circulatory complications: Secondary | ICD-10-CM | POA: Diagnosis present

## 2022-11-21 DIAGNOSIS — K766 Portal hypertension: Secondary | ICD-10-CM | POA: Diagnosis present

## 2022-11-21 DIAGNOSIS — E7220 Disorder of urea cycle metabolism, unspecified: Secondary | ICD-10-CM

## 2022-11-21 LAB — COMPREHENSIVE METABOLIC PANEL
ALT: 61 U/L — ABNORMAL HIGH (ref 0–44)
AST: 143 U/L — ABNORMAL HIGH (ref 15–41)
Albumin: 3.3 g/dL — ABNORMAL LOW (ref 3.5–5.0)
Alkaline Phosphatase: 202 U/L — ABNORMAL HIGH (ref 38–126)
Anion gap: 13 (ref 5–15)
BUN: 28 mg/dL — ABNORMAL HIGH (ref 8–23)
CO2: 22 mmol/L (ref 22–32)
Calcium: 9.5 mg/dL (ref 8.9–10.3)
Chloride: 102 mmol/L (ref 98–111)
Creatinine, Ser: 1.43 mg/dL — ABNORMAL HIGH (ref 0.61–1.24)
GFR, Estimated: 53 mL/min — ABNORMAL LOW (ref >60)
Glucose, Bld: 84 mg/dL (ref 70–99)
Potassium: 4.7 mmol/L (ref 3.5–5.1)
Sodium: 137 mmol/L (ref 135–145)
Total Bilirubin: 1.8 mg/dL — ABNORMAL HIGH (ref 0.3–1.2)
Total Protein: 8.8 g/dL — ABNORMAL HIGH (ref 6.5–8.1)

## 2022-11-21 LAB — DIFFERENTIAL
Abs Immature Granulocytes: 0.02 10*3/uL (ref 0.00–0.07)
Basophils Absolute: 0 10*3/uL (ref 0.0–0.1)
Basophils Relative: 1 %
Eosinophils Absolute: 0.2 10*3/uL (ref 0.0–0.5)
Eosinophils Relative: 4 %
Immature Granulocytes: 0 %
Lymphocytes Relative: 29 %
Lymphs Abs: 1.4 10*3/uL (ref 0.7–4.0)
Monocytes Absolute: 0.9 10*3/uL (ref 0.1–1.0)
Monocytes Relative: 18 %
Neutro Abs: 2.3 10*3/uL (ref 1.7–7.7)
Neutrophils Relative %: 48 %

## 2022-11-21 LAB — URINALYSIS, ROUTINE W REFLEX MICROSCOPIC
Bacteria, UA: NONE SEEN
Bilirubin Urine: NEGATIVE
Glucose, UA: >=500 mg/dL — AB
Hgb urine dipstick: NEGATIVE
Ketones, ur: NEGATIVE mg/dL
Leukocytes,Ua: NEGATIVE
Nitrite: NEGATIVE
Protein, ur: NEGATIVE mg/dL
Specific Gravity, Urine: 1.021 (ref 1.005–1.030)
pH: 5 (ref 5.0–8.0)

## 2022-11-21 LAB — AMMONIA: Ammonia: 135 umol/L — ABNORMAL HIGH (ref 9–35)

## 2022-11-21 LAB — RAPID URINE DRUG SCREEN, HOSP PERFORMED
Amphetamines: NOT DETECTED
Barbiturates: NOT DETECTED
Benzodiazepines: NOT DETECTED
Cocaine: NOT DETECTED
Opiates: POSITIVE — AB
Tetrahydrocannabinol: POSITIVE — AB

## 2022-11-21 LAB — LIPASE, BLOOD: Lipase: 31 U/L (ref 11–51)

## 2022-11-21 LAB — CBC
HCT: 49 % (ref 39.0–52.0)
Hemoglobin: 16.2 g/dL (ref 13.0–17.0)
MCH: 31.2 pg (ref 26.0–34.0)
MCHC: 33.1 g/dL (ref 30.0–36.0)
MCV: 94.2 fL (ref 80.0–100.0)
Platelets: 129 10*3/uL — ABNORMAL LOW (ref 150–400)
RBC: 5.2 MIL/uL (ref 4.22–5.81)
RDW: 15.1 % (ref 11.5–15.5)
WBC: 4.8 10*3/uL (ref 4.0–10.5)
nRBC: 0 % (ref 0.0–0.2)

## 2022-11-21 MED ORDER — INSULIN ASPART 100 UNIT/ML IJ SOLN
0.0000 [IU] | Freq: Every day | INTRAMUSCULAR | Status: DC
  Administered 2022-11-24: 2 [IU] via SUBCUTANEOUS
  Filled 2022-11-21: qty 0.05

## 2022-11-21 MED ORDER — HEPARIN SODIUM (PORCINE) 5000 UNIT/ML IJ SOLN: 5000.0000 [IU] | Freq: Three times a day (TID) | INTRAMUSCULAR | Status: DC

## 2022-11-21 MED ORDER — SODIUM CHLORIDE 0.9 % IV SOLN: INTRAVENOUS | Status: AC

## 2022-11-21 MED ORDER — SODIUM CHLORIDE 0.9 % IV SOLN: INTRAVENOUS | Status: DC

## 2022-11-21 MED ORDER — AMLODIPINE BESYLATE 5 MG PO TABS
5.0000 mg | ORAL_TABLET | Freq: Every day | ORAL | Status: DC
  Administered 2022-11-22 – 2022-11-25 (×4): 5 mg via ORAL
  Filled 2022-11-21 (×4): qty 1

## 2022-11-21 MED ORDER — CARVEDILOL 6.25 MG PO TABS
6.2500 mg | ORAL_TABLET | Freq: Every day | ORAL | Status: DC
  Administered 2022-11-22 – 2022-11-25 (×4): 6.25 mg via ORAL
  Filled 2022-11-21 (×4): qty 1

## 2022-11-21 MED ORDER — FERROUS GLUCONATE 324 (38 FE) MG PO TABS
324.0000 mg | ORAL_TABLET | Freq: Two times a day (BID) | ORAL | Status: DC
  Administered 2022-11-22: 324 mg via ORAL
  Filled 2022-11-21 (×2): qty 1

## 2022-11-21 MED ORDER — ONDANSETRON HCL 4 MG/2ML IJ SOLN
4.0000 mg | Freq: Once | INTRAMUSCULAR | Status: AC
  Administered 2022-11-21: 4 mg via INTRAVENOUS
  Filled 2022-11-21: qty 2

## 2022-11-21 MED ORDER — SUCRALFATE 1 GM/10ML PO SUSP
1.0000 g | Freq: Three times a day (TID) | ORAL | Status: DC
  Administered 2022-11-22 – 2022-11-25 (×13): 1 g via ORAL
  Filled 2022-11-21 (×13): qty 10

## 2022-11-21 MED ORDER — PANTOPRAZOLE SODIUM 40 MG PO TBEC
40.0000 mg | DELAYED_RELEASE_TABLET | Freq: Two times a day (BID) | ORAL | Status: DC
  Administered 2022-11-22 – 2022-11-25 (×7): 40 mg via ORAL
  Filled 2022-11-21 (×7): qty 1

## 2022-11-21 MED ORDER — INSULIN ASPART 100 UNIT/ML IJ SOLN
0.0000 [IU] | Freq: Three times a day (TID) | INTRAMUSCULAR | Status: DC
  Administered 2022-11-23 (×2): 2 [IU] via SUBCUTANEOUS
  Administered 2022-11-24: 5 [IU] via SUBCUTANEOUS
  Administered 2022-11-24: 3 [IU] via SUBCUTANEOUS
  Administered 2022-11-24: 2 [IU] via SUBCUTANEOUS
  Filled 2022-11-21: qty 0.15

## 2022-11-21 MED ORDER — POLYETHYLENE GLYCOL 3350 17 G PO PACK
17.0000 g | PACK | Freq: Every day | ORAL | Status: DC | PRN
  Administered 2022-11-22: 17 g via ORAL
  Filled 2022-11-21: qty 1

## 2022-11-21 MED ORDER — LACTULOSE 10 GM/15ML PO SOLN
20.0000 g | Freq: Once | ORAL | Status: AC
  Administered 2022-11-21: 20 g via ORAL
  Filled 2022-11-21: qty 30

## 2022-11-21 MED ORDER — LISINOPRIL 20 MG PO TABS
20.0000 mg | ORAL_TABLET | Freq: Every day | ORAL | Status: DC
  Administered 2022-11-22 – 2022-11-25 (×4): 20 mg via ORAL
  Filled 2022-11-21 (×4): qty 1

## 2022-11-21 MED ORDER — INSULIN GLARGINE-YFGN 100 UNIT/ML ~~LOC~~ SOLN
15.0000 [IU] | Freq: Every day | SUBCUTANEOUS | Status: DC
  Filled 2022-11-21: qty 0.15

## 2022-11-21 MED ORDER — ACETAMINOPHEN 325 MG PO TABS: 650.0000 mg | ORAL_TABLET | Freq: Four times a day (QID) | ORAL | Status: DC | PRN

## 2022-11-21 MED ORDER — LACTULOSE 10 GM/15ML PO SOLN
20.0000 g | Freq: Two times a day (BID) | ORAL | Status: DC
  Administered 2022-11-22: 20 g via ORAL
  Filled 2022-11-21: qty 30

## 2022-11-21 MED ORDER — ONDANSETRON HCL 4 MG/2ML IJ SOLN: 4.0000 mg | Freq: Four times a day (QID) | INTRAMUSCULAR | Status: DC | PRN

## 2022-11-21 MED ORDER — ONDANSETRON HCL 4 MG PO TABS: 4.0000 mg | ORAL_TABLET | Freq: Four times a day (QID) | ORAL | Status: DC | PRN

## 2022-11-21 MED ORDER — SODIUM CHLORIDE 0.9 % IV BOLUS
1000.0000 mL | Freq: Once | INTRAVENOUS | Status: AC
  Administered 2022-11-21: 1000 mL via INTRAVENOUS

## 2022-11-21 MED ORDER — ACETAMINOPHEN 650 MG RE SUPP: 650.0000 mg | Freq: Four times a day (QID) | RECTAL | Status: DC | PRN

## 2022-11-21 MED ORDER — MORPHINE SULFATE ER 15 MG PO TBCR
15.0000 mg | EXTENDED_RELEASE_TABLET | Freq: Two times a day (BID) | ORAL | Status: DC
  Administered 2022-11-22 (×2): 15 mg via ORAL
  Filled 2022-11-21 (×2): qty 1
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
## 2022-11-21 NOTE — ED Triage Notes (Signed)
Pt reports shoulder pain, started new cancer treatment 6 days ago and has not been able to eat or drink anything since then.
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
## 2022-11-21 NOTE — ED Provider Notes (Signed)
[place] EMERGENCY DEPARTMENT AT [facility] Provider Note   CSN: [id number] Arrival date & time: 11/21/22  1837     History  Chief Complaint  Patient presents with   Weakness    Troy [name] is a 67 y.o. male.   Weakness    Patient has a history of diabetes, hypertension, hypercholesterolemia, cancer who presents to the ED for generalized malaise, decreased appetite, not eating or drinking well for the last week since starting a new medication.  Patient is getting treatment for hepatocellular carcinoma.  Patient was started on Lenvima on July 1.  He feels like his symptoms have been ongoing since that time.  He has had some trouble with elevated blood pressures but over the last several days and week has had decreased appetite generalized malaise, generalized weakness.  No complaints of fevers or chills.  No vomiting or diarrhea.  No headache no chest pain.  No focal numbness or weakness.  Home Medications Prior to Admission medications   Medication Sig Start Date End Date Taking? Authorizing Provider  acetaminophen (TYLENOL) 500 MG tablet Take 500 mg by mouth every 6 (six) hours as needed for moderate pain.    [provider]  amLODipine (NORVASC) 5 MG tablet Take 1 tablet (5 mg total) by mouth daily. 11/18/22   [name], MD  carboxymethylcellulose (REFRESH PLUS) 0.5 % SOLN Place 1 drop into both eyes 4 (four) times daily as needed (dry eyes).    [provider]  carvedilol (COREG) 6.25 MG tablet Take 1 tablet (6.25 mg total) by mouth daily. 09/10/22 09/10/23  Cirigliano, Vito V, DO  docusate sodium (COLACE) 100 MG capsule Take 1 capsule (100 mg total) by mouth 2 (two) times daily. Patient not taking: Reported on 10/16/2022 01/23/22   Shon Hough, NP  empagliflozin (JARDIANCE) 25 MG TABS tablet Take 25 mg by mouth daily. 11/04/19   [provider]  ferrous gluconate (FERGON) 324 MG tablet Take 324 mg by mouth 2 (two) times daily with a meal.  03/17/22   [provider]  insulin glargine (LANTUS) 100 UNIT/ML injection Inject 0.15 mLs (15 Units total) into the skin daily. Patient taking differently: Inject 15 Units into the skin daily before breakfast. 11/04/21   Lewie Chamber, MD  lenvatinib 8 mg daily dose (LENVIMA) 2 x 4 MG capsule Take 2 capsules (8 mg total) by mouth daily. 10/24/22   [name], MD  lisinopril (ZESTRIL) 40 MG tablet Take 0.5 tablets (20 mg total) by mouth daily. 10/19/22   [name], MD  metFORMIN (GLUCOPHAGE) 850 MG tablet Take 850 mg by mouth 2 (two) times daily.    [provider]  methocarbamol (ROBAXIN) 500 MG tablet Take 1 tablet (500 mg total) by mouth 2 (two) times daily as needed for muscle spasms. 09/22/22   Pollyann Samples, NP  morphine (MS CONTIN) 15 MG 12 hr tablet Take 1 tablet (15 mg total) by mouth every 12 (twelve) hours. 11/04/22   Pickenpack-Cousar, Arty Baumgartner, NP  morphine 20 MG/5ML solution Take 0.6-1.3 mLs (2.4-5.2 mg total) by mouth every 4 (four) hours as needed for pain. 11/04/22   Pickenpack-Cousar, Arty Baumgartner, NP  ondansetron (ZOFRAN) 8 MG tablet Take 1 tablet (8 mg total) by mouth every 8 (eight) hours as needed for nausea or vomiting. 09/22/22   Pollyann Samples, NP  pantoprazole (PROTONIX) 40 MG tablet Take 1 tablet (40 mg total) by mouth 2 (two) times daily before a meal. 10/19/22  [name], MD  polyethylene glycol powder (MIRALAX) 17 GM/SCOOP powder Take 255 g by mouth daily. Patient not taking: Reported on 10/16/2022 11/26/21   Pickenpack-Cousar, Arty Baumgartner, NP  promethazine (PHENERGAN) 12.5 MG tablet Take 2 tablets (25 mg total) by mouth every 6 (six) hours as needed for refractory nausea / vomiting. 09/22/22   Pollyann Samples, NP  sennosides-docusate sodium (SENOKOT-S) 8.6-50 MG tablet Take 1 tablet by mouth 2 (two) times daily.    [provider]  sucralfate (CARAFATE) 1 GM/10ML suspension Take 10 mLs (1 g total) by mouth 4 (four) times daily -  with meals and at  bedtime for 14 days. 10/19/22 11/02/22  [name], MD  VIAGRA 100 MG tablet Take 100 mg by mouth as needed for erectile dysfunction. Patient not taking: Reported on 10/16/2022 11/29/19 02/07/23  [provider]  prochlorperazine (COMPAZINE) 10 MG tablet Take 1 tablet (10 mg total) by mouth every 6 (six) hours as needed (Nausea or vomiting). 12/03/21 01/16/22  [name], MD      Allergies    Patient has no known allergies.    Review of Systems   Review of Systems  Neurological:  Positive for weakness.    Physical Exam Updated Vital Signs BP (!) 158/98   Pulse 78   Temp 98.2 F (36.8 C) (Oral)   Resp 11   Ht 1.803 m (5\' 11" )   Wt 59 kg   SpO2 98%   BMI 18.14 kg/m  Physical Exam  ED Results / Procedures / Treatments   Labs (all labs ordered are listed, but only abnormal results are displayed) Labs Reviewed  CBC - Abnormal; Notable for the following components:      Result Value   Platelets 129 (*)    All other components within normal limits  COMPREHENSIVE METABOLIC PANEL - Abnormal; Notable for the following components:   BUN 28 (*)    Creatinine, Ser 1.43 (*)    Total Protein 8.8 (*)    Albumin 3.3 (*)    AST 143 (*)    ALT 61 (*)    Alkaline Phosphatase 202 (*)    Total Bilirubin 1.8 (*)    GFR, Estimated 53 (*)    All other components within normal limits  RAPID URINE DRUG SCREEN, HOSP PERFORMED - Abnormal; Notable for the following components:   Opiates POSITIVE (*)    Tetrahydrocannabinol POSITIVE (*)    All other components within normal limits  URINALYSIS, ROUTINE W REFLEX MICROSCOPIC - Abnormal; Notable for the following components:   Glucose, UA >=500 (*)    All other components within normal limits  AMMONIA - Abnormal; Notable for the following components:   Ammonia 135 (*)    All other components within normal limits  DIFFERENTIAL  LIPASE, BLOOD    EKG None  Radiology CT HEAD WO CONTRAST  Result Date: 11/21/2022 CLINICAL DATA:  Bitemporal  headache. EXAM: CT HEAD WITHOUT CONTRAST TECHNIQUE: Contiguous axial images were obtained from the base of the skull through the vertex without intravenous contrast. RADIATION DOSE REDUCTION: This exam was performed according to the departmental dose-optimization program which includes automated exposure control, adjustment of the mA and/or kV according to patient size and/or use of iterative reconstruction technique. COMPARISON:  None Available. FINDINGS: Brain: No evidence of acute infarction, hemorrhage, hydrocephalus, extra-axial collection or mass lesion/mass effect. Vascular: No hyperdense vessel or unexpected calcification. Skull: Normal. Negative for fracture or focal lesion. Sinuses/Orbits: No acute finding. Other: None. IMPRESSION: No  acute intracranial pathology. Electronically Signed   By: Larose Hires D.O.   On: 11/21/2022 21:28   DG Chest Portable 1 View  Result Date: 11/21/2022 CLINICAL DATA:  Shoulder pain and has not been able to eat or drink anything since starting new cancer treatment 6 days ago. EXAM: PORTABLE CHEST 1 VIEW COMPARISON:  August 28, 2022 FINDINGS: There is stable right-sided venous Port-A-Cath positioning. The heart size and mediastinal contours are within normal limits. Low lung volumes are noted. Mild atelectasis is seen within the right lung base. A stable right basilar calcified granuloma is noted. Multiple small radiopaque foci are seen within the left lung base. Multilevel degenerative changes seen throughout the thoracic spine. IMPRESSION: Low lung volumes with mild right basilar atelectasis. Electronically Signed   By: Aram Candela M.D.   On: 11/21/2022 21:11    Procedures .Critical Care  Performed by: Linwood Dibbles, MD Authorized by: Linwood Dibbles, MD   Critical care provider statement:    Critical care time (minutes):  30   Critical care was time spent personally by me on the following activities:  Development of treatment plan with patient or surrogate,  discussions with consultants, evaluation of patient's response to treatment, examination of patient, ordering and review of laboratory studies, ordering and review of radiographic studies, ordering and performing treatments and interventions, pulse oximetry, re-evaluation of patient's condition and review of old charts     Medications Ordered in ED Medications  ondansetron (ZOFRAN) injection 4 mg (4 mg Intravenous Given 11/21/22 2022)  sodium chloride 0.9 % bolus 1,000 mL (0 mLs Intravenous Stopped 11/21/22 2122)  lactulose (CHRONULAC) 10 GM/15ML solution 20 g (20 g Oral Given 11/21/22 2214)    ED Course/ Medical Decision Making/ A&P Clinical Course as of 11/21/22 2227  Fri Nov 21, 2022  2151 Comprehensive metabolic panel(!) Creatinine elevated compared to previous [JK]  2151 Ammonia(!) Ammonia elevated at 135 [JK]  2152 CT HEAD WO CONTRAST CT without acute abnormalities. [JK]  2152 DG Chest Portable 1 View Last x-ray without signs of pneumonia [JK]  2227 Case discussed with Dr Haroldine Laws [JK]    Clinical Course User Index [JK] Linwood Dibbles, MD                             Medical Decision Making Problems Addressed: Dehydration: acute illness or injury Hepatocellular carcinoma [facility]): chronic illness or injury Hyperammonemia [facility]): acute illness or injury that poses a threat to life or bodily functions Weakness: acute illness or injury  Amount and/or Complexity of Data Reviewed Labs: ordered. Decision-making details documented in ED Course. Radiology: ordered and independent interpretation performed. Decision-making details documented in ED Course.  Risk Prescription drug management.   Patient has hepatocellular carcinoma.  He is currently undergoing chemotherapy treatment.  Patient just recently had a CT scan of his abdomen pelvis on July 5.  At that time it showed slight increase in the mass in his left upper lobe of his liver.  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  Patient presented with complaints of increasing weakness, decreased p.o. intake associated with his chemotherapy agent.  On exam patient does appear dehydrated.  His labs did show an increase in his creatinine.  Patient had generalized weakness but was following commands no focal deficits noted.  No signs of acute abnormality on head CT.  No signs of acute infection.  Patient's labs however do show elevated creatinine and  elevated ammonia level.  I suspect dehydration and his increasing ammonia is contributing to his weakness and malaise.  Patient has been started on IV fluids.  Have ordered a dose of lactulose.  I will consult the medical service for admission and further treatment        Final Clinical Impression(s) / ED Diagnoses Final diagnoses:  Weakness  Dehydration  Hyperammonemia (HCC)  Hepatocellular carcinoma [facility])    Rx / DC Orders ED Discharge Orders     None         Linwood Dibbles, MD 11/21/22 2227

## 2022-11-22 ENCOUNTER — Encounter (HOSPITAL_COMMUNITY): Payer: Self-pay | Admitting: Family Medicine

## 2022-11-22 ENCOUNTER — Observation Stay (HOSPITAL_COMMUNITY): Payer: No Typology Code available for payment source

## 2022-11-22 DIAGNOSIS — E860 Dehydration: Secondary | ICD-10-CM | POA: Diagnosis present

## 2022-11-22 DIAGNOSIS — M19011 Primary osteoarthritis, right shoulder: Secondary | ICD-10-CM | POA: Diagnosis present

## 2022-11-22 DIAGNOSIS — Z8619 Personal history of other infectious and parasitic diseases: Secondary | ICD-10-CM | POA: Diagnosis not present

## 2022-11-22 DIAGNOSIS — I81 Portal vein thrombosis: Secondary | ICD-10-CM | POA: Diagnosis present

## 2022-11-22 DIAGNOSIS — K7460 Unspecified cirrhosis of liver: Secondary | ICD-10-CM | POA: Diagnosis present

## 2022-11-22 DIAGNOSIS — Z794 Long term (current) use of insulin: Secondary | ICD-10-CM | POA: Diagnosis not present

## 2022-11-22 DIAGNOSIS — N179 Acute kidney failure, unspecified: Secondary | ICD-10-CM

## 2022-11-22 DIAGNOSIS — K7682 Hepatic encephalopathy: Principal | ICD-10-CM

## 2022-11-22 DIAGNOSIS — Z800 Family history of malignant neoplasm of digestive organs: Secondary | ICD-10-CM | POA: Diagnosis not present

## 2022-11-22 DIAGNOSIS — R636 Underweight: Secondary | ICD-10-CM | POA: Diagnosis present

## 2022-11-22 DIAGNOSIS — Z87891 Personal history of nicotine dependence: Secondary | ICD-10-CM | POA: Diagnosis not present

## 2022-11-22 DIAGNOSIS — E7800 Pure hypercholesterolemia, unspecified: Secondary | ICD-10-CM | POA: Diagnosis present

## 2022-11-22 DIAGNOSIS — G893 Neoplasm related pain (acute) (chronic): Secondary | ICD-10-CM

## 2022-11-22 DIAGNOSIS — C220 Liver cell carcinoma: Secondary | ICD-10-CM

## 2022-11-22 DIAGNOSIS — K5900 Constipation, unspecified: Secondary | ICD-10-CM | POA: Diagnosis present

## 2022-11-22 DIAGNOSIS — E119 Type 2 diabetes mellitus without complications: Secondary | ICD-10-CM | POA: Diagnosis present

## 2022-11-22 DIAGNOSIS — I10 Essential (primary) hypertension: Secondary | ICD-10-CM | POA: Diagnosis present

## 2022-11-22 DIAGNOSIS — M6281 Muscle weakness (generalized): Secondary | ICD-10-CM | POA: Diagnosis not present

## 2022-11-22 DIAGNOSIS — Z79899 Other long term (current) drug therapy: Secondary | ICD-10-CM | POA: Diagnosis not present

## 2022-11-22 DIAGNOSIS — G9340 Encephalopathy, unspecified: Secondary | ICD-10-CM | POA: Diagnosis not present

## 2022-11-22 DIAGNOSIS — M545 Low back pain, unspecified: Secondary | ICD-10-CM | POA: Diagnosis present

## 2022-11-22 DIAGNOSIS — Z681 Body mass index (BMI) 19 or less, adult: Secondary | ICD-10-CM | POA: Diagnosis not present

## 2022-11-22 DIAGNOSIS — Z8249 Family history of ischemic heart disease and other diseases of the circulatory system: Secondary | ICD-10-CM | POA: Diagnosis not present

## 2022-11-22 DIAGNOSIS — M19012 Primary osteoarthritis, left shoulder: Secondary | ICD-10-CM | POA: Diagnosis present

## 2022-11-22 DIAGNOSIS — Z7984 Long term (current) use of oral hypoglycemic drugs: Secondary | ICD-10-CM | POA: Diagnosis not present

## 2022-11-22 DIAGNOSIS — K766 Portal hypertension: Secondary | ICD-10-CM | POA: Diagnosis present

## 2022-11-22 LAB — CBC WITH DIFFERENTIAL/PLATELET
Abs Immature Granulocytes: 0.01 10*3/uL (ref 0.00–0.07)
Basophils Absolute: 0 10*3/uL (ref 0.0–0.1)
Basophils Relative: 1 %
Eosinophils Absolute: 0.1 10*3/uL (ref 0.0–0.5)
Eosinophils Relative: 3 %
HCT: 41.7 % (ref 39.0–52.0)
Hemoglobin: 13.7 g/dL (ref 13.0–17.0)
Immature Granulocytes: 0 %
Lymphocytes Relative: 24 %
Lymphs Abs: 1 10*3/uL (ref 0.7–4.0)
MCH: 31.2 pg (ref 26.0–34.0)
MCHC: 32.9 g/dL (ref 30.0–36.0)
MCV: 95 fL (ref 80.0–100.0)
Monocytes Absolute: 0.7 10*3/uL (ref 0.1–1.0)
Monocytes Relative: 17 %
Neutro Abs: 2.2 10*3/uL (ref 1.7–7.7)
Neutrophils Relative %: 55 %
Platelets: 107 10*3/uL — ABNORMAL LOW (ref 150–400)
RBC: 4.39 MIL/uL (ref 4.22–5.81)
RDW: 14.8 % (ref 11.5–15.5)
WBC: 4 10*3/uL (ref 4.0–10.5)
nRBC: 0 % (ref 0.0–0.2)

## 2022-11-22 LAB — GLUCOSE, CAPILLARY
Glucose-Capillary: 123 mg/dL — ABNORMAL HIGH (ref 70–99)
Glucose-Capillary: 85 mg/dL (ref 70–99)

## 2022-11-22 LAB — COMPREHENSIVE METABOLIC PANEL
ALT: 52 U/L — ABNORMAL HIGH (ref 0–44)
AST: 120 U/L — ABNORMAL HIGH (ref 15–41)
Albumin: 2.9 g/dL — ABNORMAL LOW (ref 3.5–5.0)
Alkaline Phosphatase: 169 U/L — ABNORMAL HIGH (ref 38–126)
Anion gap: 11 (ref 5–15)
BUN: 26 mg/dL — ABNORMAL HIGH (ref 8–23)
CO2: 21 mmol/L — ABNORMAL LOW (ref 22–32)
Calcium: 8.5 mg/dL — ABNORMAL LOW (ref 8.9–10.3)
Chloride: 105 mmol/L (ref 98–111)
Creatinine, Ser: 1.22 mg/dL (ref 0.61–1.24)
GFR, Estimated: >60 mL/min (ref >60)
Glucose, Bld: 75 mg/dL (ref 70–99)
Potassium: 4.5 mmol/L (ref 3.5–5.1)
Sodium: 137 mmol/L (ref 135–145)
Total Bilirubin: 1.6 mg/dL — ABNORMAL HIGH (ref 0.3–1.2)
Total Protein: 7.3 g/dL (ref 6.5–8.1)

## 2022-11-22 LAB — PROTIME-INR
INR: 1.2 (ref 0.8–1.2)
Prothrombin Time: 15.9 seconds — ABNORMAL HIGH (ref 11.4–15.2)

## 2022-11-22 LAB — AMMONIA: Ammonia: 98 umol/L — ABNORMAL HIGH (ref 9–35)

## 2022-11-22 MED ORDER — CHLORHEXIDINE GLUCONATE CLOTH 2 % EX PADS
6.0000 | MEDICATED_PAD | Freq: Every day | CUTANEOUS | Status: DC
  Administered 2022-11-22 – 2022-11-25 (×4): 6 via TOPICAL

## 2022-11-22 MED ORDER — MORPHINE SULFATE (CONCENTRATE) 10 MG/0.5ML PO SOLN
2.0000 mg | Freq: Four times a day (QID) | ORAL | Status: DC | PRN
  Administered 2022-11-23 – 2022-11-24 (×2): 2 mg via ORAL
  Filled 2022-11-22 (×2): qty 0.5

## 2022-11-22 MED ORDER — LACTULOSE 10 GM/15ML PO SOLN
30.0000 g | Freq: Two times a day (BID) | ORAL | Status: DC
  Administered 2022-11-22 – 2022-11-24 (×5): 30 g via ORAL
  Filled 2022-11-22 (×5): qty 45

## 2022-11-22 MED ORDER — LIDOCAINE 5 % EX PTCH
1.0000 | MEDICATED_PATCH | CUTANEOUS | Status: DC
  Administered 2022-11-22 – 2022-11-23 (×2): 1 via TRANSDERMAL
  Filled 2022-11-22 (×4): qty 1

## 2022-11-22 MED ORDER — INSULIN GLARGINE-YFGN 100 UNIT/ML ~~LOC~~ SOLN
7.0000 [IU] | Freq: Every day | SUBCUTANEOUS | Status: DC
  Administered 2022-11-22 – 2022-11-25 (×4): 7 [IU] via SUBCUTANEOUS
  Filled 2022-11-22 (×4): qty 0.07

## 2022-11-22 MED ORDER — SODIUM CHLORIDE 0.9% FLUSH: 10.0000 mL | INTRAVENOUS | Status: DC | PRN

## 2022-11-22 NOTE — Evaluation (Signed)
Physical Therapy Evaluation Patient Details Name: Troy [name] MRN: [id number] DOB: 01/20/1956 Today's Date: 11/22/2022  History of Present Illness  67 yo male admitted with acute hepatic encephalopathy, bil shoulder pain. Hx of chronic pain, stage IV hepatocellular Ca, Hep C, cirrhosis, DM  Clinical Impression  On eval, pt required Min A for mobility. He stood at the bedside and took several side steps towards the [facility]. Pt appears weak and fatigued. He had difficulty remaining alert when not stimulated. Deferred ambulation for safety reasons and assisted pt back to bed. Wife was present during session and provided history/PLOF/home environment information. She is planning for d/c back home but would like to have some in home assistance. Will continue to follow pt during this hospital stay. Will attempt to progress activity as safely able. At this time, pt would benefit from HHPT f/u (wife reports they have already been making arrangements).         Assistance Recommended at Discharge Frequent or constant Supervision/Assistance  If plan is discharge home, recommend the following:  Can travel by private vehicle  A little help with walking and/or transfers;A little help with bathing/dressing/bathroom;Assistance with cooking/housework;Assist for transportation;Help with stairs or ramp for entrance        Equipment Recommendations  (continuing to assess-depends on progress)  Recommendations for Other Services       Functional Status Assessment Patient has had a recent decline in their functional status and demonstrates the ability to make significant improvements in function in a reasonable and predictable amount of time.     Precautions / Restrictions Precautions Precautions: Fall Restrictions Weight Bearing Restrictions: No      Mobility  Bed Mobility Overal bed mobility: Needs Assistance Bed Mobility: Supine to Sit, Sit to Supine     Supine to sit: HOB elevated, Min guard Sit  to supine: HOB elevated, Min assist   General bed mobility comments: Cues required. Increased time. Min A for LEs back onto bed and for positionng once in bed.    Transfers Overall transfer level: Needs assistance   Transfers: Sit to/from Stand Sit to Stand: Min guard           General transfer comment: Increased time. Cues required.    Ambulation/Gait Ambulation/Gait assistance: Min assist             General Gait Details: side steps along bedside. assist to steady pt. fall risk 2* level of alertness-deferred ambulation on today  Stairs            Wheelchair Mobility     Tilt Bed    Modified Rankin (Stroke Patients Only)       Balance Overall balance assessment: Needs assistance         Standing balance support: During functional activity, No upper extremity supported Standing balance-Leahy Scale: Fair                               Pertinent Vitals/Pain Pain Assessment Pain Assessment: Faces Faces Pain Scale: Hurts even more Pain Location: bil shoulder with attempts to assiss flexion ROM Pain Descriptors / Indicators: Grimacing Pain Intervention(s): Limited activity within patient's tolerance    Home Living Family/patient expects to be discharged to:: Private residence Living Arrangements: Spouse/significant other Available Help at Discharge: Family;Available PRN/intermittently Type of Home: House Home Access: Stairs to enter   Entrance Stairs-Number of Steps: 1 Alternate Level Stairs-Number of Steps: full flight Home Layout: Two level Home Equipment: Gilmer Mor -  single point      Prior Function Prior Level of Function : Independent/Modified Independent             Mobility Comments: Pt is independent without AD, PRN use of SPC, denies falls in past 6 months but wife does note pt with decreased balance & weakness since undergoing treatment.       Hand Dominance        Extremity/Trunk Assessment   Upper Extremity  Assessment Upper Extremity Assessment: Defer to OT evaluation    Lower Extremity Assessment Lower Extremity Assessment: Generalized weakness    Cervical / Trunk Assessment Cervical / Trunk Assessment: Normal  Communication   Communication: No difficulties  Cognition Arousal/Alertness: Lethargic Behavior During Therapy: Flat affect Overall Cognitive Status: Impaired/Different from baseline Area of Impairment: Problem solving                             Problem Solving: Requires verbal cues, Slow processing General Comments: falls asleep quickly/easily without stimulation; appears weak        General Comments      Exercises     Assessment/Plan    PT Assessment Patient needs continued PT services  PT Problem List Decreased strength;Decreased activity tolerance;Decreased balance;Decreased mobility;Decreased knowledge of use of DME;Decreased cognition;Pain       PT Treatment Interventions Gait training;DME instruction;Therapeutic exercise;Balance training;Functional mobility training;Therapeutic activities;Patient/family education    PT Goals (Current goals can be found in the Care Plan section)  Acute Rehab PT Goals Patient Stated Goal: wife wants home with  more PT Goal Formulation: With patient Time For Goal Achievement: 12/06/22 Potential to Achieve Goals: Good    Frequency Min 1X/week     Co-evaluation               AM-PAC PT "6 Clicks" Mobility  Outcome Measure Help needed turning from your back to your side while in a flat bed without using bedrails?: A Little Help needed moving from lying on your back to sitting on the side of a flat bed without using bedrails?: A Little Help needed moving to and from a bed to a chair (including a wheelchair)?: A Little Help needed standing up from a chair using your arms (e.g., wheelchair or bedside chair)?: A Little Help needed to walk in hospital room?: A Lot Help needed climbing 3-5 steps with a  railing? : A Lot 6 Click Score: 16    End of Session   Activity Tolerance: Patient limited by fatigue;Patient limited by lethargy Patient left: in bed;with call bell/phone within reach;with bed alarm set;with family/visitor present   PT Visit Diagnosis: Muscle weakness (generalized) (M62.81);Difficulty in walking, not elsewhere classified (R26.2);Pain Pain - Right/Left:  (bilateral) Pain - part of body: Shoulder    Time: 4098-1191 PT Time Calculation (min) (ACUTE ONLY): 24 min   Charges:   PT Evaluation $PT Eval Low Complexity: 1 Low PT Treatments $Therapeutic Activity: 8-22 mins PT General Charges $$ ACUTE PT VISIT: 1 Visit            Faye Ramsay, PT Acute Rehabilitation  Office: [phone]

## 2022-11-22 NOTE — Progress Notes (Signed)
PROGRESS NOTE  Troy [name]  DOB: August 02, 1955  PCP: [name], MD WJX:[id number]  DOA: 11/21/2022  LOS: 0 days  Hospital Day: 2  Brief narrative: Troy [name] is a 67 y.o. male with PMH significant for HCC stage IV unresectable, portal vein thrombosis, hepatitis C, liver cirrhosis, GI bleed, DM2, HTN, HLD, chronic low back pain. Follows up with oncology Dr. [name]. Last seen by palliative care 6/25 7/1, patient was started on new medicine Lenvima.  As a side effect of that, patient started having elevated blood pressure and was started on amlodipine 5 mg daily.  He lost appetite and oral hydration.  Also started having shoulder pain and hence presented to ED on 7/12.  In the ED, patient was afebrile, hemodynamically stable Labs with WBC count normal, hemoglobin normal, platelet 129, BUN/creatinine 28/1.43, ammonia 135, elevated LFTs,  Urinalysis unremarkable Urine drug screen positive for opiates and THC CT head unremarkable Chest x-ray with mild right basilar atelectasis.. Started on lactulose Admitted to [facility]  Subjective: Patient was seen and examined this morning.  Pleasant elderly African-American male.  Propped up in bed.  Able to open eyes and answer few questions.  Mostly somnolent throughout the conversation.  Wife at bedside helps with most of the information. Remains hemodynamically stable Repeat labs this morning with platelet 107, creatinine improved to 1.22, ammonia improved to 98  Assessment and plan: Acute hepatic encephalopathy Presented with lethargy, confusion in the setting of liver cirrhosis and dehydration secondary to poor oral intake from cancer treatment Initial ammonia level was elevated to 135.  Previously not on lactulose. Started on lactulose 20 mg p.o. twice daily.  Continue to monitor ammonia level Recent Labs  Lab 11/21/22 2024 11/22/22 0344  AMMONIA 135* 98*   AKI Baseline creatinine normal.  Present with creatinine 1.43, improving with IV  fluid. Recent Labs    10/13/22 0959 10/15/22 1111 10/16/22 1053 10/17/22 0319 10/18/22 0339 10/24/22 0755 10/30/22 1904 11/04/22 0816 11/21/22 2012 11/22/22 0344  BUN 19 21 19 15 9 13 16 18  28* 26*  CREATININE 1.10 0.95 1.10 1.08 0.94 0.84 1.05 0.99 1.43* 1.22   Lethargy Generalized weakness Multifactorial: Has chronic comorbidities as discussed.  Recently started on new immunotherapy agent after which his appetite, hydration and energy has worsened. PT eval, nutritional consult  Stage IV HCC -unresectable Follows up with oncology Dr. [name] New oral immunotherapy [name] started on 7/1 Oncology Dr. [name] notified.  Cancer associated pain Chronic low back pain Pain meds titrated by palliative as an outpatient. PTA meds-MS Contin 15 mg twice daily.  Resumed on the same.   Bilateral shoulder pain X-rays neck and shoulder ordered.  Likely DJD.  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  Type 2 diabetes mellitus A1c 4.8 on June 24 PTA meds-Lantus 15 units daily.  Given poor intake, given low-dose of insulin with Semglee at 7 units this morning. Continue SSI/Accu-Cheks Recent Labs  Lab 11/22/22 0013  GLUCAP 85   Mobility: Encourage ambulation, PT eval  Goals of care   Code Status: Full Code.  Follows up with palliative as an outpatient    DVT prophylaxis:  Place and maintain sequential compression  device Start: 11/21/22 2330   Antimicrobials: None Fluid: NS at 75 mill per hour currently Consultants: Oncology Family Communication: Wife at bedside  Status: Observation Level of  care:  Progressive   Patient from: Home Anticipated d/c to: Pending clinical course Needs to continue in-hospital care:  Needs improvement in ammonia, mental status and physical strength.       Diet:  Diet Order             Diet heart healthy/carb modified Room service appropriate? Yes; Fluid consistency: Thin  Diet effective now                   Scheduled Meds:  amLODipine  5 mg Oral Daily   carvedilol  6.25 mg Oral Daily   Chlorhexidine Gluconate Cloth  6 each Topical Daily   ferrous gluconate  324 mg Oral BID WC   insulin aspart  0-15 Units Subcutaneous TID WC   insulin aspart  0-5 Units Subcutaneous QHS   insulin glargine-yfgn  7 Units Subcutaneous Daily   lactulose  20 g Oral BID   lidocaine  1 patch Transdermal Q24H   lisinopril  20 mg Oral Daily   morphine  15 mg Oral Q12H   pantoprazole  40 mg Oral BID AC   sucralfate  1 g Oral TID WC & HS    PRN meds: acetaminophen **OR** acetaminophen, ondansetron **OR** ondansetron (ZOFRAN) IV, polyethylene glycol, sodium chloride flush   Infusions:     Antimicrobials: Anti-infectives (From admission, onward)    None       Nutritional status:  Body mass index is 18.14 kg/m.          Objective: Vitals:   11/22/22 0804 11/22/22 1015  BP: 138/88 (!) 124/94  Pulse: 81   Resp:    Temp:    SpO2: 100%     Intake/Output Summary (Last 24 hours) at 11/22/2022 1152 Last data filed at 11/22/2022 0953 Gross per 24 hour  Intake 1450 ml  Output 625 ml  Net 825 ml   Filed Weights   11/21/22 1852  Weight: 59 kg   Weight change:  Body mass index is 18.14 kg/m.   Physical Exam: General exam: Pleasant, elderly African-American male.  Not in distress.  Somnolent Skin: No rashes, lesions or ulcers. HEENT: Atraumatic, normocephalic, no obvious bleeding Lungs: Clear to auscultation bilaterally CVS: Regular rate and rhythm, no murmur GI/Abd soft, nontender, nondistended, bowel sound present CNS:  Somnolent mostly, not restless or agitated Psychiatry: Sad affect Extremities: No pedal edema, no calf tenderness  Data Review: I have personally reviewed the laboratory data and studies available.  F/u labs ordered Unresulted Labs (From admission, onward)     Start     Ordered   11/23/22 0500  CBC with Differential/Platelet  Daily,   R     Question:  Specimen collection method  Answer:  IV Team=IV Team collect   11/22/22 1152   11/23/22 0500  Comprehensive metabolic panel  Daily,   R     Question:  Specimen collection method  Answer:  IV Team=IV Team collect   11/22/22 1152   11/23/22 0500  Ammonia  Daily,   R     Question:  Specimen collection method  Answer:  IV Team=IV Team collect   11/22/22 1152            Total time spent in review of labs and imaging, patient evaluation, formulation of plan, documentation and communication with family: 65 minutes  Signed, Lorin Glass, MD [facility] 11/22/2022
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
## 2022-11-22 NOTE — Plan of Care (Signed)
  Problem: Coping: Goal: Level of anxiety will decrease Outcome: Progressing   Problem: Pain Managment: Goal: General experience of comfort will improve Outcome: Progressing   

## 2022-11-22 NOTE — TOC Initial Note (Addendum)
Transition of Care [facility]) - Initial/Assessment Note    Patient Details  Name: Troy [name] MRN: [id number] Date of Birth: 1956/04/16  Transition of Care [facility]) CM/SW Contact:    Troy Prows, RN Phone Number: 11/22/2022, 3:00 PM  Clinical Narrative:                 [facility] consult for d/c planning; spoke w/ pt's wife Troy [name] [phone]) in room; pt is unresponsive; she says pt is from home and she plans for him to return at d/c; Mrs. [name] says he has transportation; she denies him experiencing food/housing insecurity; difficulty paying utilities, and IPV; she says pt has a waling stick and shower chair; pt's PCP at [place] is Troy [name]; [name] says pt does not have an assigned case worker at [place], but she last spoke w/ [name], SW at [place] [phone], ext [phone]); she says she contacted the office because she needs assistance w/ caring for pt at home; will pass on to oncoming [facility] for [facility] follow up since agency is closed on weekends; awaiting PT eval; TOC will follow.  Expected Discharge Plan: Home w Home Health Services Barriers to Discharge: Continued Medical Work up   Patient Goals and CMS Choice Patient states their goals for this hospitalization and ongoing recovery are:: patient's wife Troy [name] plans for him to return home w/ assistance from [place]          Expected Discharge Plan and Services   Discharge Planning Services: CM Consult   Living arrangements for the past 2 months: Single Family Home                                      Prior Living Arrangements/Services Living arrangements for the past 2 months: Single Family Home Lives with:: Spouse Patient language and need for interpreter reviewed:: Yes        Need for Family Participation in Patient Care: Yes (Comment) Care giver support system in place?: Yes (comment) Current home services: DME (walkig stick, and shower chair) Criminal Activity/Legal Involvement Pertinent to Current  Situation/Hospitalization: No - Comment as needed  Activities of Daily Living Home Assistive Devices/Equipment: CBG Meter, Eyeglasses, Blood pressure cuff, Cane (specify quad or straight) ADL Screening (condition at time of admission) Patient's cognitive ability adequate to safely complete daily activities?: No Is the patient deaf or have difficulty hearing?: No Does the patient have difficulty seeing, even when wearing glasses/contacts?: No Does the patient have difficulty concentrating, remembering, or making decisions?: Yes Patient able to express need for assistance with ADLs?: Yes Does the patient have difficulty dressing or bathing?: Yes Independently performs ADLs?: No Communication: Independent Dressing (OT): Needs assistance Is this a change from baseline?: Pre-admission baseline Grooming: Independent Feeding: Independent Bathing: Needs assistance Is this a change from baseline?: Pre-admission baseline Toileting: Needs assistance Is this a change from baseline?: Pre-admission baseline In/Out Bed: Independent Walks in Home: Independent with device (comment) Does the patient have difficulty walking or climbing stairs?: Yes Weakness of Legs: Both Weakness of Arms/Hands: Both  Permission Sought/Granted Permission sought to share information with : Case Manager Permission granted to share information with : Yes, Verbal Permission Granted  Share Information with NAME: Case Manager     Permission granted to share info w Relationship: Troy [name] (spouse) [phone]     Emotional Assessment Appearance:: Appears stated age Attitude/Demeanor/Rapport: Unresponsive Affect (typically observed): Unable to Assess Orientation: :  (  unable to assess orientation) Alcohol / Substance Use: Not Applicable Psych Involvement: No (comment)  Admission diagnosis:  Hepatic encephalopathy (HCC) [K76.82] Dehydration [E86.0] Hepatocellular carcinoma (HCC) [C22.0] Hyperammonemia (HCC)  [E72.20] Weakness [R53.1] Patient Active Problem List   Diagnosis Date Noted   Acute hepatic encephalopathy (HCC) 11/21/2022   Hematuria 10/17/2022   Protein-calorie malnutrition, severe 10/17/2022   Acute GI bleeding 10/16/2022   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   Melena 09/10/2022   ABLA (acute blood loss anemia) 09/10/2022   Dehydration 04/22/2022   Tachycardia 04/22/2022   Hypercalcemia 04/22/2022   Thrombocytopenia (HCC) 04/22/2022   Decreased range of motion of neck 04/02/2022   Neck pain 04/02/2022   Nausea & vomiting 12/18/2021   Hepatocellular carcinoma (HCC) 11/23/2021   Portal hypertensive gastropathy (HCC)    Secondary esophageal varices without bleeding (HCC)    Esophageal candidiasis (HCC)    Malnutrition of moderate degree 11/01/2021   Liver mass    Gastro-esophageal reflux disease without esophagitis 10/31/2021   Type 2 diabetes mellitus (HCC) 10/31/2021   Portal vein thrombosis with liver mass concerning for malignancy  10/31/2021   Cocaine abuse, uncomplicated (HCC) 01/21/2021   Stage 3b chronic kidney disease (HCC) 11/06/2020   Chronic low back pain 03/06/2020   Hyperlipidemia 03/06/2020   Hypertension 03/06/2020   Acute on chronic blood loss anemia 05/13/2019   Hepatic cirrhosis due to chronic hepatitis C infection (HCC) 05/12/2008   PCP:  Troy Mood, MD Pharmacy:   [facility] - [place], [place] - [address] [place] [address] Phone: [phone] Fax: [phone]  [id number] - [place]), [place] - [address] [place] [address] Phone: [phone] Fax: [phone]  [id number] - [place],  - [address] AT [address]  [place] [address] Phone: [phone] Fax: [phone]     Social Determinants of Health (SDOH) Social  History: SDOH Screenings   Food Insecurity: No Food Insecurity (11/22/2022)  Housing: Low Risk  (11/22/2022)  Transportation Needs: No Transportation Needs (11/22/2022)  Utilities: Not At Risk (11/22/2022)  Tobacco Use: Medium Risk (11/22/2022)   SDOH Interventions: Food Insecurity Interventions: Intervention Not Indicated, Inpatient TOC Housing Interventions: Intervention Not Indicated, Inpatient TOC Transportation Interventions: Intervention Not Indicated, Inpatient TOC Utilities Interventions: Intervention Not Indicated, Inpatient TOC   Readmission Risk Interventions     No data to display

## 2022-11-23 LAB — CBC WITH DIFFERENTIAL/PLATELET
Abs Immature Granulocytes: 0.01 10*3/uL (ref 0.00–0.07)
Basophils Absolute: 0 10*3/uL (ref 0.0–0.1)
Basophils Relative: 0 %
Eosinophils Absolute: 0.1 10*3/uL (ref 0.0–0.5)
Eosinophils Relative: 3 %
HCT: 41.4 % (ref 39.0–52.0)
Hemoglobin: 13.5 g/dL (ref 13.0–17.0)
Immature Granulocytes: 0 %
Lymphocytes Relative: 22 %
Lymphs Abs: 0.8 10*3/uL (ref 0.7–4.0)
MCH: 31.5 pg (ref 26.0–34.0)
MCHC: 32.6 g/dL (ref 30.0–36.0)
MCV: 96.5 fL (ref 80.0–100.0)
Monocytes Absolute: 0.5 10*3/uL (ref 0.1–1.0)
Monocytes Relative: 15 %
Neutro Abs: 2.1 10*3/uL (ref 1.7–7.7)
Neutrophils Relative %: 60 %
Platelets: 97 10*3/uL — ABNORMAL LOW (ref 150–400)
RBC: 4.29 MIL/uL (ref 4.22–5.81)
RDW: 15.3 % (ref 11.5–15.5)
WBC: 3.5 10*3/uL — ABNORMAL LOW (ref 4.0–10.5)
nRBC: 0 % (ref 0.0–0.2)

## 2022-11-23 LAB — GLUCOSE, CAPILLARY
Glucose-Capillary: 133 mg/dL — ABNORMAL HIGH (ref 70–99)
Glucose-Capillary: 112 mg/dL — ABNORMAL HIGH (ref 70–99)
Glucose-Capillary: 128 mg/dL — ABNORMAL HIGH (ref 70–99)
Glucose-Capillary: 68 mg/dL — ABNORMAL LOW (ref 70–99)
Glucose-Capillary: 102 mg/dL — ABNORMAL HIGH (ref 70–99)

## 2022-11-23 LAB — COMPREHENSIVE METABOLIC PANEL
ALT: 48 U/L — ABNORMAL HIGH (ref 0–44)
AST: 111 U/L — ABNORMAL HIGH (ref 15–41)
Albumin: 2.7 g/dL — ABNORMAL LOW (ref 3.5–5.0)
Alkaline Phosphatase: 155 U/L — ABNORMAL HIGH (ref 38–126)
Anion gap: 9 (ref 5–15)
BUN: 16 mg/dL (ref 8–23)
CO2: 19 mmol/L — ABNORMAL LOW (ref 22–32)
Calcium: 7.9 mg/dL — ABNORMAL LOW (ref 8.9–10.3)
Chloride: 108 mmol/L (ref 98–111)
Creatinine, Ser: 1.03 mg/dL (ref 0.61–1.24)
GFR, Estimated: >60 mL/min (ref >60)
Glucose, Bld: 147 mg/dL — ABNORMAL HIGH (ref 70–99)
Potassium: 3.8 mmol/L (ref 3.5–5.1)
Sodium: 136 mmol/L (ref 135–145)
Total Bilirubin: 1.6 mg/dL — ABNORMAL HIGH (ref 0.3–1.2)
Total Protein: 6.9 g/dL (ref 6.5–8.1)

## 2022-11-23 LAB — AMMONIA: Ammonia: 41 umol/L — ABNORMAL HIGH (ref 9–35)

## 2022-11-23 MED ORDER — ALTEPLASE 2 MG IJ SOLR
2.0000 mg | Freq: Once | INTRAMUSCULAR | Status: AC
  Administered 2022-11-23: 2 mg
  Filled 2022-11-23: qty 2

## 2022-11-23 MED ORDER — ENSURE ENLIVE PO LIQD
237.0000 mL | Freq: Two times a day (BID) | ORAL | Status: DC
  Administered 2022-11-24 – 2022-11-25 (×3): 237 mL via ORAL

## 2022-11-23 MED ORDER — LACTULOSE 10 GM/15ML PO SOLN: 30.0000 g | ORAL | Status: DC | PRN

## 2022-11-23 MED ORDER — ALTEPLASE 2 MG IJ SOLR: 2.0000 mg | Freq: Once | INTRAMUSCULAR | Status: DC | PRN

## 2022-11-23 NOTE — Progress Notes (Signed)
PROGRESS NOTE  Troy [name]  DOB: [birth year]  PCP: [name], MD RJJ:[id number]  DOA: 11/21/2022  LOS: 1 day  Hospital Day: 3  Brief narrative: Troy [name] is a 67 y.o. male with PMH significant for HCC stage IV unresectable, portal vein thrombosis, hepatitis C, liver cirrhosis, GI bleed, DM2, HTN, HLD, chronic low back pain. Follows up with oncology Dr. [name] -last seen on 6/14 Last seen by palliative care 6/25 7/6, patient was started on new medicine Lenvima.  As a side effect of that, patient started having elevated blood pressure and was started on amlodipine 5 mg daily.  He lost appetite and oral hydration.  Also started having shoulder pain and hence presented to ED on 7/12.  In the ED, patient was afebrile, hemodynamically stable Labs with WBC count normal, hemoglobin normal, platelet 129, BUN/creatinine 28/1.43, ammonia 135, elevated LFTs,  Urinalysis unremarkable Urine drug screen positive for opiates and THC CT head unremarkable Chest x-ray with mild right basilar atelectasis.. Started on lactulose Admitted to [facility]  Subjective: Patient was seen and examined this morning. Propped up in bed.  More awake today.  Smiling.  Wife at bedside.  Had 2 bowel movements this morning  Ambulated on the hallway earlier this morning.   Labs this morning with improving pneumonia.  Assessment and plan: Acute hepatic encephalopathy Presented with lethargy, confusion in the setting of liver cirrhosis and dehydration secondary to poor oral intake from cancer treatment Initial ammonia level was elevated to 135.  Previously not on lactulose. Started on lactulose 20 mg p.o. twice daily.  Had 2 bowel movements last 24 hours.  Ammonia level improving.  Mental status improving. Recent Labs  Lab 11/21/22 2024 11/22/22 0344 11/23/22 0322  AMMONIA 135* 98* 41*   AKI Baseline creatinine normal.  Present with creatinine 1.43, improving with IV fluid. Recent Labs    10/15/22 1111  10/16/22 1053 10/17/22 0319 10/18/22 0339 10/24/22 0755 10/30/22 1904 11/04/22 0816 11/21/22 2012 11/22/22 0344 11/23/22 0322  BUN 21 19 15 9 13 16 18  28* 26* 16  CREATININE 0.95 1.10 1.08 0.94 0.84 1.05 0.99 1.43* 1.22 1.03   Lethargy Generalized weakness Multifactorial: Has chronic comorbidities as discussed.  Recently started on new immunotherapy agent after which his appetite, hydration and energy has worsened. PT eval, nutritional consult  Stage IV HCC -unresectable Follows up with oncology Dr. [name] New oral immunotherapy [name] started on 7/6 Oncology Dr. [name] consult associated.  Cancer associated pain Chronic low back pain Pain meds titrated by palliative as an outpatient. PTA meds-MS Contin 15 mg twice daily.  Currently on reduced dose because of altered mental status   Bilateral shoulder pain X-rays neck and shoulder did not show any acute fracture, dislocation..  Likely DJD.  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  Type 2 diabetes mellitus A1c 4.8 on June 24 PTA meds-Lantus 15 units daily.  Given poor intake, currently on low dose of Semglee at 7 units daily. Continue SSI/Accu-Cheks Recent Labs  Lab 11/22/22 0013 11/22/22 2253 11/23/22 0749  GLUCAP 85 123* 133*   Mobility: Encourage ambulation, PT eval  Goals of care   Code Status: Full Code.  Follows up with palliative as an outpatient    DVT prophylaxis:  Place and maintain sequential compression device Start: 11/21/22 2330   Antimicrobials: None Fluid: None. Consultants: Oncology Family Communication:  Wife at bedside  Status: Inpatient Level of care:  Progressive   Patient from: Home Anticipated d/c to: Home hopefully 1 to 2 days Needs to continue in-hospital care:  Gradually improving.  Hopefully home in 1 to 2 days       Diet:  Diet Order             Diet heart healthy/carb modified Room service appropriate? Yes; Fluid consistency: Thin  Diet effective now                   Scheduled Meds:  alteplase  2 mg Intracatheter Once   amLODipine  5 mg Oral Daily   carvedilol  6.25 mg Oral Daily   Chlorhexidine Gluconate Cloth  6 each Topical Daily   insulin aspart  0-15 Units Subcutaneous TID WC   insulin aspart  0-5 Units Subcutaneous QHS   insulin glargine-yfgn  7 Units Subcutaneous Daily   lactulose  30 g Oral BID   lidocaine  1 patch Transdermal Q24H   lisinopril  20 mg Oral Daily   pantoprazole  40 mg Oral BID AC   sucralfate  1 g Oral TID WC & HS    PRN meds: alteplase, morphine CONCENTRATE, ondansetron **OR** ondansetron (ZOFRAN) IV, polyethylene glycol, sodium chloride flush   Infusions:     Antimicrobials: Anti-infectives (From admission, onward)    None       Nutritional status:  Body mass index is 18.14 kg/m.          Objective: Vitals:   11/23/22 0259 11/23/22 0958  BP: 127/74 120/76  Pulse: 77   Resp: 13   Temp: 97.9 F (36.6 C)   SpO2: 100%     Intake/Output Summary (Last 24 hours) at 11/23/2022 1122 Last data filed at 11/23/2022 0304 Gross per 24 hour  Intake 800 ml  Output 850 ml  Net -50 ml   Filed Weights   11/21/22 1852  Weight: 59 kg   Weight change:  Body mass index is 18.14 kg/m.   Physical Exam: General exam: Pleasant, elderly African-American male. More awake.  Not in physical distress Skin: No rashes, lesions or ulcers. HEENT: Atraumatic, normocephalic, no obvious bleeding Lungs: Clear to auscultation bilaterally CVS: Regular rate and rhythm, no murmur GI/Abd soft, nontender, nondistended, bowel  sound present CNS: alert, awake, able to answer questions today Psychiatry: Mood appropriate Extremities: No pedal edema, no calf tenderness  Data Review: I have personally reviewed the laboratory data and studies available.  F/u labs ordered Unresulted Labs (From admission, onward)     Start     Ordered   11/23/22 0500  CBC with Differential/Platelet  Daily,   R     Question:  Specimen collection method  Answer:  IV Team=IV Team collect   11/22/22 1152   11/23/22 0500  Comprehensive metabolic panel  Daily,   R     Question:  Specimen collection method  Answer:  IV Team=IV Team collect   11/22/22 1152   11/23/22 0500  Ammonia  Daily,   R     Question:  Specimen collection method  Answer:  IV Team=IV Team collect   11/22/22 1152            Total time spent in review of labs and imaging, patient evaluation, formulation  of plan, documentation and communication with family: 45 minutes  Signed, Lorin Glass, MD [facility] 11/23/2022

## 2022-11-23 NOTE — Plan of Care (Signed)
  Problem: Education: Goal: Ability to describe self-care measures that may prevent or decrease complications (Diabetes Survival Skills Education) will improve Outcome: Progressing   Problem: Health Behavior/Discharge Planning: Goal: Ability to manage health-related needs will improve Outcome: Progressing   Problem: Clinical Measurements: Goal: Ability to maintain clinical measurements within normal limits will improve Outcome: Progressing   
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
## 2022-11-24 DIAGNOSIS — K7682 Hepatic encephalopathy: Secondary | ICD-10-CM | POA: Diagnosis not present

## 2022-11-24 LAB — COMPREHENSIVE METABOLIC PANEL
ALT: 56 U/L — ABNORMAL HIGH (ref 0–44)
AST: 140 U/L — ABNORMAL HIGH (ref 15–41)
Albumin: 2.9 g/dL — ABNORMAL LOW (ref 3.5–5.0)
Alkaline Phosphatase: 282 U/L — ABNORMAL HIGH (ref 38–126)
Anion gap: 9 (ref 5–15)
BUN: 13 mg/dL (ref 8–23)
CO2: 22 mmol/L (ref 22–32)
Calcium: 8.8 mg/dL — ABNORMAL LOW (ref 8.9–10.3)
Chloride: 109 mmol/L (ref 98–111)
Creatinine, Ser: 0.88 mg/dL (ref 0.61–1.24)
GFR, Estimated: >60 mL/min (ref >60)
Glucose, Bld: 100 mg/dL — ABNORMAL HIGH (ref 70–99)
Potassium: 4 mmol/L (ref 3.5–5.1)
Sodium: 140 mmol/L (ref 135–145)
Total Bilirubin: 3.8 mg/dL — ABNORMAL HIGH (ref 0.3–1.2)
Total Protein: 7.1 g/dL (ref 6.5–8.1)

## 2022-11-24 LAB — CBC WITH DIFFERENTIAL/PLATELET
Abs Immature Granulocytes: 0.01 10*3/uL (ref 0.00–0.07)
Basophils Absolute: 0 10*3/uL (ref 0.0–0.1)
Basophils Relative: 0 %
Eosinophils Absolute: 0 10*3/uL (ref 0.0–0.5)
Eosinophils Relative: 0 %
HCT: 40.7 % (ref 39.0–52.0)
Hemoglobin: 13.4 g/dL (ref 13.0–17.0)
Immature Granulocytes: 0 %
Lymphocytes Relative: 15 %
Lymphs Abs: 0.7 10*3/uL (ref 0.7–4.0)
MCH: 30.8 pg (ref 26.0–34.0)
MCHC: 32.9 g/dL (ref 30.0–36.0)
MCV: 93.6 fL (ref 80.0–100.0)
Monocytes Absolute: 0.5 10*3/uL (ref 0.1–1.0)
Monocytes Relative: 11 %
Neutro Abs: 3.4 10*3/uL (ref 1.7–7.7)
Neutrophils Relative %: 74 %
Platelets: 90 10*3/uL — ABNORMAL LOW (ref 150–400)
RBC: 4.35 MIL/uL (ref 4.22–5.81)
RDW: 15 % (ref 11.5–15.5)
WBC: 4.7 10*3/uL (ref 4.0–10.5)
nRBC: 0 % (ref 0.0–0.2)

## 2022-11-24 LAB — AMMONIA: Ammonia: 48 umol/L — ABNORMAL HIGH (ref 9–35)

## 2022-11-24 LAB — GLUCOSE, CAPILLARY
Glucose-Capillary: 149 mg/dL — ABNORMAL HIGH (ref 70–99)
Glucose-Capillary: 161 mg/dL — ABNORMAL HIGH (ref 70–99)
Glucose-Capillary: 246 mg/dL — ABNORMAL HIGH (ref 70–99)
Glucose-Capillary: 120 mg/dL — ABNORMAL HIGH (ref 70–99)
Glucose-Capillary: 221 mg/dL — ABNORMAL HIGH (ref 70–99)

## 2022-11-24 MED ORDER — MEGESTROL ACETATE 400 MG/10ML PO SUSP
400.0000 mg | Freq: Every day | ORAL | Status: DC
  Administered 2022-11-25: 400 mg via ORAL
  Filled 2022-11-24: qty 10

## 2022-11-24 MED ORDER — ORAL CARE MOUTH RINSE: 15.0000 mL | OROMUCOSAL | Status: DC | PRN

## 2022-11-24 NOTE — Progress Notes (Signed)
PROGRESS NOTE  Troy [name]  DOB: [birth year]  PCP: [name], MD ZOX:[id number]  DOA: 11/21/2022  LOS: 2 days  Hospital Day: 4  Brief narrative: Troy [name] is a 67 y.o. male with PMH significant for HCC stage IV unresectable, portal vein thrombosis, hepatitis C, liver cirrhosis, GI bleed, DM2, HTN, HLD, chronic low back pain. Follows up with oncology Dr. [name] -last seen on 6/14 Last seen by palliative care 6/25 7/6, patient was started on new medicine Lenvima.  As a side effect of that, patient started having elevated blood pressure and was started on amlodipine 5 mg daily.  He lost appetite and oral hydration.  Also started having shoulder pain and hence presented to ED on 7/12.  In the ED, patient was afebrile, hemodynamically stable Labs with WBC count normal, hemoglobin normal, platelet 129, BUN/creatinine 28/1.43, ammonia 135, elevated LFTs,  Urinalysis unremarkable Urine drug screen positive for opiates and THC CT head unremarkable Chest x-ray with mild right basilar atelectasis.. Started on lactulose Admitted to [facility]  Subjective: Patient was seen and examined this morning. Appetite low but more awake, had 1 bowel movement yesterday.  Ammonia level in 40s. Wife is at bedside.  Assessment and plan: Acute hepatic encephalopathy Presented with lethargy, confusion in the setting of liver cirrhosis and dehydration secondary to poor oral intake from cancer treatment Initial ammonia level was elevated to 135.  Previously not on lactulose. Started on lactulose 20 mg p.o. twice daily.  Had bowel movements.  Ammonia level improving  Recent Labs  Lab 11/21/22 2024 11/22/22 0344 11/23/22 0322 11/24/22 0251  AMMONIA 135* 98* 41* 48*   AKI Baseline creatinine normal.  Present with creatinine 1.43, improving with IV fluid. Recent Labs    10/16/22 1053 10/17/22 0319 10/18/22 0339 10/24/22 0755 10/30/22 1904 11/04/22 0816 11/21/22 2012 11/22/22 0344 11/23/22 0322  11/24/22 0251  BUN 19 15 9 13 16 18  28* 26* 16 13  CREATININE 1.10 1.08 0.94 0.84 1.05 0.99 1.43* 1.22 1.03 0.88   Lethargy Generalized weakness Multifactorial: Has chronic comorbidities as discussed.  Recently started on new immunotherapy agent after which his appetite, hydration and energy has worsened. PT eval, nutritional consult  Stage IV HCC -unresectable Follows up with oncology Dr. [name] New oral immunotherapy [name] started on 7/6 Oncology Dr. [name] following..  Cancer associated pain Chronic low back pain Pain meds titrated by palliative as an outpatient. PTA meds-MS Contin 15 mg twice daily.  Currently on reduced dose because of altered mental status   Bilateral shoulder pain X-rays neck and shoulder did not show any acute fracture, dislocation..  Likely DJD.  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  Type 2 diabetes mellitus A1c 4.8 on June 24 PTA meds-Lantus 15 units daily.  Given poor intake, currently on low dose of Semglee at 7 units daily. Continue SSI/Accu-Cheks Recent Labs  Lab 11/23/22 1612 11/23/22 2103 11/23/22 2133 11/24/22 0753 11/24/22 1205  GLUCAP 128* 68* 102* 149* 161*   Mobility: Encourage ambulation, PT eval  Goals of care   Code Status: Full Code.  Follows up with palliative as an outpatient    DVT prophylaxis:  Place and maintain sequential  compression device Start: 11/21/22 2330   Antimicrobials: None Fluid: None. Consultants: Oncology Family Communication: Wife at bedside  Status: Inpatient Level of care:  Progressive   Patient from:  Home Anticipated d/c to: Home hopefully 1 to 2 days Needs to continue in-hospital care:  Gradually improving.  Hopefully home in 1 to 2 days    Diet:  Diet Order             Diet regular Room service appropriate? Yes; Fluid consistency: Thin  Diet effective now                   Scheduled Meds:  amLODipine  5 mg Oral Daily   carvedilol  6.25 mg Oral Daily   Chlorhexidine Gluconate Cloth  6 each Topical Daily   feeding supplement  237 mL Oral BID BM   insulin aspart  0-15 Units Subcutaneous TID WC   insulin aspart  0-5 Units Subcutaneous QHS   insulin glargine-yfgn  7 Units Subcutaneous Daily   lactulose  30 g Oral BID   lidocaine  1 patch Transdermal Q24H   lisinopril  20 mg Oral Daily   pantoprazole  40 mg Oral BID AC   sucralfate  1 g Oral TID WC & HS    PRN meds: alteplase, lactulose, morphine CONCENTRATE, ondansetron **OR** ondansetron (ZOFRAN) IV, mouth rinse, polyethylene glycol, sodium chloride flush   Infusions:     Antimicrobials: Anti-infectives (From admission, onward)    None       Nutritional status:  Body mass index is 18.14 kg/m.  Nutrition Problem: Underweight Etiology: chronic illness Signs/Symptoms: other (comment) (BMI <18.5)     Objective: Vitals:   11/24/22 0451 11/24/22 0903  BP: (!) 140/76 139/77  Pulse: 95   Resp: 16   Temp: 99 F (37.2 C)   SpO2: 100%     Intake/Output Summary (Last 24 hours) at 11/24/2022 1311 Last data filed at 11/24/2022 0557 Gross per 24 hour  Intake 598 ml  Output 200 ml  Net 398 ml   Filed Weights   11/21/22 1852  Weight: 59 kg   Weight change:  Body mass index is 18.14 kg/m.   Physical Exam: General exam: Pleasant, elderly African-American male. More awake.  Not in physical distress Skin: No rashes, lesions or ulcers. HEENT: Atraumatic, normocephalic, no obvious bleeding Lungs: Clear to auscultation bilaterally CVS: Regular rate and rhythm, no murmur GI/Abd soft, nontender,  nondistended, bowel sound present CNS: alert, awake, able to answer simple questions. Psychiatry: Mood appropriate Extremities: No pedal edema, no calf tenderness  Data Review: I have personally reviewed the laboratory data and studies available.  F/u labs ordered Unresulted Labs (From admission, onward)     Start     Ordered   11/23/22 0500  CBC with Differential/Platelet  Daily,   R     Question:  Specimen collection method  Answer:  IV Team=IV Team collect   11/22/22 1152   11/23/22 0500  Comprehensive metabolic panel  Daily,   R     Question:  Specimen collection method  Answer:  IV Team=IV Team collect   11/22/22 1152   11/23/22 0500  Ammonia  Daily,   R     Question:  Specimen collection method  Answer:  IV Team=IV Team collect   11/22/22 1152            Total time spent in review of labs and imaging, patient evaluation, formulation of plan, documentation and communication with family: 45 minutes  Signed, Lorin Glass, MD [facility] 11/24/2022

## 2022-11-24 NOTE — Plan of Care (Signed)
  Problem: Coping: Goal: Ability to adjust to condition or change in health will improve Outcome: Progressing   Problem: Fluid Volume: Goal: Ability to maintain a balanced intake and output will improve Outcome: Progressing   Problem: Health Behavior/Discharge Planning: Goal: Ability to identify and utilize available resources and services will improve Outcome: Progressing Goal: Ability to manage health-related needs will improve Outcome: Progressing   Problem: Nutritional: Goal: Maintenance of adequate nutrition will improve Outcome: Progressing Goal: Progress toward achieving an optimal weight will improve Outcome: Progressing

## 2022-11-24 NOTE — Progress Notes (Signed)
Physical Therapy Treatment Patient Details Name: Troy [name] MRN: [id number] DOB: March 07, 1956 Today's Date: 11/24/2022   History of Present Illness 67 yo male admitted with acute hepatic encephalopathy, bil shoulder pain. Hx of chronic pain, stage IV hepatocellular Ca, Hep C, cirrhosis, DM    PT Comments  Pt now agreeable to working with therapy. He tolerated activity well. Will continue to follow and progress activity as tolerated.      Assistance Recommended at Discharge Frequent or constant Supervision/Assistance  If plan is discharge home, recommend the following:  Can travel by private vehicle    A little help with walking and/or transfers;A little help with bathing/dressing/bathroom;Assistance with cooking/housework;Assist for transportation;Help with stairs or ramp for entrance      Equipment Recommendations  Rolling walker (2 wheels)    Recommendations for Other Services       Precautions / Restrictions Precautions Precautions: Fall Restrictions Weight Bearing Restrictions: No     Mobility  Bed Mobility   Bed Mobility: Supine to Sit     Supine to sit: Supervision, HOB elevated          Transfers Overall transfer level: Needs assistance Equipment used: None Transfers: Sit to/from Stand Sit to Stand: Min guard                Ambulation/Gait Ambulation/Gait assistance: Min assist, Min guard Gait Distance (Feet): 150 Feet Assistive device: Rolling walker (2 wheels) Gait Pattern/deviations: Step-through pattern, Decreased stride length       General Gait Details: Intermittent unsteadiness requiring small amount of assist for correction. Tolerated distance well.   Stairs             Wheelchair Mobility     Tilt Bed    Modified Rankin (Stroke Patients Only)       Balance                                            Cognition Arousal/Alertness: Awake/alert Behavior During Therapy: Flat affect Overall  Cognitive Status: Impaired/Different from baseline                                          Exercises      General Comments        Pertinent Vitals/Pain Pain Assessment Pain Assessment: Faces Faces Pain Scale: Hurts even more Pain Location: bil shoulders, abdomen Pain Descriptors / Indicators: Grimacing, Discomfort Pain Intervention(s): Limited activity within patient's tolerance, Monitored during session, Repositioned    Home Living                          Prior Function            PT Goals (current goals can now be found in the care plan section) Progress towards PT goals: Progressing toward goals    Frequency    Min 1X/week      PT Plan Current plan remains appropriate    Co-evaluation              AM-PAC PT "6 Clicks" Mobility   Outcome Measure  Help needed turning from your back to your side while in a flat bed without using bedrails?: None Help needed moving from lying on your back to sitting on the side of  a flat bed without using bedrails?: None Help needed moving to and from a bed to a chair (including a wheelchair)?: A Little Help needed standing up from a chair using your arms (e.g., wheelchair or bedside chair)?: A Little Help needed to walk in hospital room?: A Little Help needed climbing 3-5 steps with a railing? : A Little 6 Click Score: 20    End of Session Equipment Utilized During Treatment: Gait belt Activity Tolerance: Patient tolerated treatment well Patient left: in chair;with call bell/phone within reach;with chair alarm set;with family/visitor present   PT Visit Diagnosis: Muscle weakness (generalized) (M62.81);Difficulty in walking, not elsewhere classified (R26.2);Pain Pain - part of body: Shoulder (abdomen)     Time: 1610-9604 PT Time Calculation (min) (ACUTE ONLY): 14 min  Charges:    $Gait Training: 8-22 mins PT General Charges $$ ACUTE PT VISIT: 1 Visit                         Faye Ramsay, PT Acute Rehabilitation  Office: [phone]

## 2022-11-24 NOTE — Plan of Care (Signed)
  Problem: Fluid Volume: Goal: Ability to maintain a balanced intake and output will improve Outcome: Progressing   Problem: Education: Goal: Knowledge of General Education information will improve Description: Including pain rating scale, medication(s)/side effects and non-pharmacologic comfort measures Outcome: Progressing   Problem: Health Behavior/Discharge Planning: Goal: Ability to manage health-related needs will improve Outcome: Progressing   Problem: Elimination: Goal: Will not experience complications related to bowel motility Outcome: Progressing   Problem: Pain Managment: Goal: General experience of comfort will improve Outcome: Progressing   Problem: Safety: Goal: Ability to remain free from injury will improve Outcome: Progressing

## 2022-11-24 NOTE — Progress Notes (Signed)
PT Cancellation Note  Patient Details Name: Troy [name] MRN: [id number] DOB: July 30, 1955   Cancelled Treatment:    Reason Eval/Treat Not Completed:  Attempted PT tx session-pt declined to participate at this time.    Faye Ramsay, PT Acute Rehabilitation  Office: [phone]

## 2022-11-24 NOTE — Progress Notes (Addendum)
Troy [name]   DOB:[birth year]   QI#:[id number]   XLK#:[id number]  Oncology f/u   Subjective: Patient has been having multiple loose BM yesterday.  He is still pretty weak, was able to walk with physical therapist in the hallway, but otherwise has been in bed most of the time.  His appetite is low, does not feel hungry.  His wife wants him to try appetite stimulant.  He is more awake today, able to sit in the bathroom when I talked to him.  Objective:  Vitals:   11/24/22 1416 11/24/22 2133  BP: 105/64 110/68  Pulse: 95 95  Resp: 18 20  Temp: 99.1 F (37.3 C) 98.6 F (37 C)  SpO2: 99% 97%    Body mass index is 18.14 kg/m.  Intake/Output Summary (Last 24 hours) at 11/24/2022 2301 Last data filed at 11/24/2022 2237 Gross per 24 hour  Intake 598 ml  Output --  Net 598 ml     Sclerae unicteric  Oropharynx clear  No peripheral adenopathy  Lungs clear -- no rales or rhonchi  Heart regular rate and rhythm  Abdomen benign  MSK no focal spinal tenderness, no peripheral edema  Neuro nonfocal  CBG (last 3)  Recent Labs    11/24/22 1623 11/24/22 2137 11/24/22 2223  GLUCAP 246* 120* 221*     Labs:   Urine Studies No results for input(s): "UHGB", "CRYS" in the last 72 hours.  Invalid input(s): "UACOL", "UAPR", "USPG", "UPH", "UTP", "UGL", "UKET", "UBIL", "UNIT", "UROB", "ULEU", "UEPI", "UWBC", "URBC", "UBAC", "CAST", "UCOM", "BILUA"  Basic Metabolic Panel: Recent Labs  Lab 11/21/22 2012 11/22/22 0344 11/23/22 0322 11/24/22 0251  NA 137 137 136 140  K 4.7 4.5 3.8 4.0  CL 102 105 108 109  CO2 22 21* 19* 22  GLUCOSE 84 75 147* 100*  BUN 28* 26* 16 13  CREATININE 1.43* 1.22 1.03 0.88  CALCIUM 9.5 8.5* 7.9* 8.8*   GFR Estimated Creatinine Clearance: 66.1 mL/min (by C-G formula based on SCr of 0.88 mg/dL). Liver Function Tests: Recent Labs  Lab 11/21/22 2012 11/22/22 0344 11/23/22 0322 11/24/22 0251  AST 143* 120* 111* 140*  ALT 61* 52* 48* 56*  ALKPHOS 202* 169*  155* 282*  BILITOT 1.8* 1.6* 1.6* 3.8*  PROT 8.8* 7.3 6.9 7.1  ALBUMIN 3.3* 2.9* 2.7* 2.9*   Recent Labs  Lab 11/21/22 2012  LIPASE 31   Recent Labs  Lab 11/21/22 2024 11/22/22 0344 11/23/22 0322 11/24/22 0251  AMMONIA 135* 98* 41* 48*   Coagulation profile Recent Labs  Lab 11/22/22 0344  INR 1.2    CBC: Recent Labs  Lab 11/21/22 2012 11/22/22 0344 11/23/22 0322 11/24/22 0251  WBC 4.8 4.0 3.5* 4.7  NEUTROABS 2.3 2.2 2.1 3.4  HGB 16.2 13.7 13.5 13.4  HCT 49.0 41.7 41.4 40.7  MCV 94.2 95.0 96.5 93.6  PLT 129* 107* 97* 90*   Cardiac Enzymes: No results for input(s): "CKTOTAL", "CKMB", "CKMBINDEX", "TROPONINI" in the last 168 hours. BNP: Invalid input(s): "POCBNP" CBG: Recent Labs  Lab 11/24/22 0753 11/24/22 1205 11/24/22 1623 11/24/22 2137 11/24/22 2223  GLUCAP 149* 161* 246* 120* 221*   D-Dimer No results for input(s): "DDIMER" in the last 72 hours. Hgb A1c No results for input(s): "HGBA1C" in the last 72 hours. Lipid Profile No results for input(s): "CHOL", "HDL", "LDLCALC", "TRIG", "CHOLHDL", "LDLDIRECT" in the last 72 hours. Thyroid function studies No results for input(s): "TSH", "T4TOTAL", "T3FREE", "THYROIDAB" in the last 72 hours.  Invalid input(s): "FREET3" Anemia  work up No results for input(s): "VITAMINB12", "FOLATE", "FERRITIN", "TIBC", "IRON", "RETICCTPCT" in the last 72 hours. Microbiology No results found for this or any previous visit (from the past 240 hour(s)).    Studies:  No results found.  Assessment: 67 y.o. 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 -Overall still very weak, not able to do much, not eating much. -Patient is interested in appetite stimulant, due to his drowsiness, mirtazapine is not an option.  I will let him try Megace -Lab reviewed, bilirubin trending up today -I discussed the goal of care with patient and his wife in length today.  I do not think he is a candidate for more liver cancer treatment, especially due to the very poor tolerance to Lenvima recently.  His prognosis is very poor, life expectancy is likely a few months.  I recommend home hospice, I discussed the logistics and the benefits with patient and his wife in detail.  They are more open to hospice now. I will inform [facility] coordinator to see if they can meet them tomorrow, and prepare for his discharge, pt agrees.  -I spent a total of 50 minutes for his care today.   [name], MD 11/24/2022

## 2022-11-25 LAB — COMPREHENSIVE METABOLIC PANEL
ALT: 74 U/L — ABNORMAL HIGH (ref 0–44)
AST: 192 U/L — ABNORMAL HIGH (ref 15–41)
Albumin: 2.7 g/dL — ABNORMAL LOW (ref 3.5–5.0)
Alkaline Phosphatase: 288 U/L — ABNORMAL HIGH (ref 38–126)
Anion gap: 6 (ref 5–15)
BUN: 15 mg/dL (ref 8–23)
CO2: 23 mmol/L (ref 22–32)
Calcium: 8.9 mg/dL (ref 8.9–10.3)
Chloride: 107 mmol/L (ref 98–111)
Creatinine, Ser: 1 mg/dL (ref 0.61–1.24)
GFR, Estimated: >60 mL/min (ref >60)
Glucose, Bld: 103 mg/dL — ABNORMAL HIGH (ref 70–99)
Potassium: 3.8 mmol/L (ref 3.5–5.1)
Sodium: 136 mmol/L (ref 135–145)
Total Bilirubin: 3.8 mg/dL — ABNORMAL HIGH (ref 0.3–1.2)
Total Protein: 6.9 g/dL (ref 6.5–8.1)

## 2022-11-25 LAB — CBC WITH DIFFERENTIAL/PLATELET
Abs Immature Granulocytes: 0.02 10*3/uL (ref 0.00–0.07)
Basophils Absolute: 0 10*3/uL (ref 0.0–0.1)
Basophils Relative: 0 %
Eosinophils Absolute: 0.1 10*3/uL (ref 0.0–0.5)
Eosinophils Relative: 2 %
HCT: 38.6 % — ABNORMAL LOW (ref 39.0–52.0)
Hemoglobin: 12.9 g/dL — ABNORMAL LOW (ref 13.0–17.0)
Immature Granulocytes: 1 %
Lymphocytes Relative: 15 %
Lymphs Abs: 0.6 10*3/uL — ABNORMAL LOW (ref 0.7–4.0)
MCH: 31.3 pg (ref 26.0–34.0)
MCHC: 33.4 g/dL (ref 30.0–36.0)
MCV: 93.7 fL (ref 80.0–100.0)
Monocytes Absolute: 0.6 10*3/uL (ref 0.1–1.0)
Monocytes Relative: 13 %
Neutro Abs: 3 10*3/uL (ref 1.7–7.7)
Neutrophils Relative %: 69 %
Platelets: 76 10*3/uL — ABNORMAL LOW (ref 150–400)
RBC: 4.12 MIL/uL — ABNORMAL LOW (ref 4.22–5.81)
RDW: 15.1 % (ref 11.5–15.5)
WBC: 4.3 10*3/uL (ref 4.0–10.5)
nRBC: 0 % (ref 0.0–0.2)

## 2022-11-25 LAB — GLUCOSE, CAPILLARY
Glucose-Capillary: 106 mg/dL — ABNORMAL HIGH (ref 70–99)
Glucose-Capillary: 180 mg/dL — ABNORMAL HIGH (ref 70–99)

## 2022-11-25 LAB — AMMONIA: Ammonia: 28 umol/L (ref 9–35)

## 2022-11-25 MED ORDER — LACTULOSE 10 GM/15ML PO SOLN: 30.0000 g | Freq: Two times a day (BID) | ORAL | 0 refills | Status: AC

## 2022-11-25 MED ORDER — ENSURE ENLIVE PO LIQD: 237.0000 mL | Freq: Two times a day (BID) | ORAL | Status: DC

## 2022-11-25 MED ORDER — HEPARIN SOD (PORK) LOCK FLUSH 100 UNIT/ML IV SOLN
500.0000 [IU] | INTRAVENOUS | Status: AC | PRN
  Administered 2022-11-25: 500 [IU]

## 2022-11-25 MED ORDER — MEGESTROL ACETATE 400 MG/10ML PO SUSP: 400.0000 mg | Freq: Every day | ORAL | 0 refills | Status: AC

## 2022-11-25 NOTE — Progress Notes (Signed)
Mobility Specialist - Progress Note   11/25/22 0911  Mobility  Activity Ambulated with assistance in hallway  Level of Assistance Standby assist, set-up cues, supervision of patient - no hands on  Assistive Device Front wheel walker  Distance Ambulated (ft) 500 ft  Range of Motion/Exercises Active  Activity Response Tolerated well  Mobility Referral Yes  $Mobility charge 1 Mobility  Mobility Specialist Start Time (ACUTE ONLY) 0900  Mobility Specialist Stop Time (ACUTE ONLY) 0911  Mobility Specialist Time Calculation (min) (ACUTE ONLY) 11 min   Pt was found in bed and agreeable to ambulate. Requires cues for body placement while using the RW. No complaints during session and at EOS returned to bed with all needs met. Family in room.  Billey Chang Mobility Specialist

## 2022-11-25 NOTE — Progress Notes (Signed)
Patient to be discharged to home this afternoon. Patient and Patient's Wife given discharge instructions including all discharge Medications and schedules for these Medicaitons. Understanding verbalized and discharge AVS with the Patient and Wife

## 2022-11-25 NOTE — TOC Transition Note (Addendum)
Transition of Care [facility]) - CM/SW Discharge Note   Patient Details  Name: Troy [name] MRN: [id number] Date of Birth: 1956/05/10  [facility] CM/SW Contact:  Lanier Clam, RN Phone Number: 11/25/2022, 11:24 AM   Clinical Narrative:   d/c home w/HHC-[facility] rep Morrie Sheldon aware of HHPT,aide. Elma(spouse) agree to d/c plan-she already has rw-will use. CM will contact Caitlin N(CSW)w/[facility] to continue to assess for long term aide service.Has own transport home. No further CM needs. -Per MD referral for residential hospice declined by spouse.      Barriers to Discharge: No Barriers Identified   Patient Goals and CMS Choice CMS Medicare.gov Compare Post Acute Care list provided to:: Patient Represenative (must comment) Choice offered to / list presented to : Spouse  Discharge Placement                         Discharge Plan and Services Additional resources added to the After Visit Summary for     Discharge Planning Services: CM Consult Post Acute Care Choice: Home Health                    HH Arranged: PT, Nurse's Aide [facility] Agency: [facility] Date HH Agency Contacted: 11/25/22 Time HH Agency Contacted: 1124 Representative spoke with at [facility] Agency: Morrie Sheldon  Social Determinants of Health (SDOH) Interventions SDOH Screenings   Food Insecurity: No Food Insecurity (11/22/2022)  Housing: Low Risk  (11/22/2022)  Transportation Needs: No Transportation Needs (11/22/2022)  Utilities: Not At Risk (11/22/2022)  Tobacco Use: Medium Risk (11/22/2022)     Readmission Risk Interventions     No data to display

## 2022-11-25 NOTE — Progress Notes (Signed)
MD making rounds made aware that Patient and Patient's Wife refuse PO Lactulose due at 10 am.
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
## 2022-11-26 ENCOUNTER — Other Ambulatory Visit: Payer: Self-pay

## 2022-11-26 ENCOUNTER — Telehealth: Payer: Self-pay

## 2022-11-26 DIAGNOSIS — C220 Liver cell carcinoma: Secondary | ICD-10-CM

## 2022-11-26 NOTE — Progress Notes (Signed)
Verbal order given w/readback from Dr. [name] for 2nd Opinion referral to [facility] Hematology & Oncology Team [phone]).  Pt and spouse requested a 2nd Opinion.  Referral packet faxed and images pushed in [facility] to [facility].  Fax confirmation received.

## 2022-11-26 NOTE — Telephone Encounter (Signed)
Spoke with pt and wife Troy [name]) via telephone regarding upcoming appt on 11/27/2022.  Per inbasket message from Dr. [name] to this nurse, Dr. [name] would like to cancel the pt's appt on 11/27/2022 d/t pt was recently d/c from hospital.  Informed pt and wife that Dr. [name] would like to cancel the pt's appt on 11/27/2022 since the pt was recently d/c from the hospital earlier this week.  Pt and wife were in agreement w/canceling the appt.  Pt and wife would like to keep the telephone visit with Lowella Bandy in Palliative Care on 12/02/2022.  Asked Dr. [name] would like to know have they (pt & wife) given more thought about their (Dr. [name], pt, and wife) conversation regarding hospice.  Pt and wife stated "They are not ready for hospice and would like a 2nd opinion".  Pt and wife requested a 2nd Opinion from [facility].  Pt and wife stated once they get the 2nd Opinion from [facility] they will be able to determine what they would like to do.  Stated please continue to contact Dr. Latanya Maudlin office or Palliative Care should the pt need anything.  Pt and wife verbalized understanding and had no further questions or concerns.  Stated that this nurse will fax the 2nd opinion referral packet to [facility] today.  Call ended.  Contact [facility] [phone]) regarding referral.  Pt is currently in their system per scheduler; therefore, referral should be easier/faster to process to get pt in for 2nd Opinion.  Contacted Drusilla Kanner in Radiology regarding pushing images of all pt's diagnostic test for 1.5 yrs to [place] so they can be seen in PACS at [facility].  [place] sent images on 11/26/2022 to [facility].

## 2022-11-27 ENCOUNTER — Inpatient Hospital Stay: Payer: No Typology Code available for payment source | Admitting: Hematology

## 2022-11-27 ENCOUNTER — Inpatient Hospital Stay: Payer: No Typology Code available for payment source

## 2022-12-02 ENCOUNTER — Telehealth: Payer: Self-pay | Admitting: Hematology

## 2022-12-02 ENCOUNTER — Encounter: Payer: Self-pay | Admitting: Nurse Practitioner

## 2022-12-02 ENCOUNTER — Inpatient Hospital Stay: Payer: No Typology Code available for payment source | Admitting: Nurse Practitioner

## 2022-12-02 DIAGNOSIS — G893 Neoplasm related pain (acute) (chronic): Secondary | ICD-10-CM

## 2022-12-02 DIAGNOSIS — C220 Liver cell carcinoma: Secondary | ICD-10-CM

## 2022-12-02 DIAGNOSIS — Z515 Encounter for palliative care: Secondary | ICD-10-CM

## 2022-12-02 DIAGNOSIS — Z7189 Other specified counseling: Secondary | ICD-10-CM | POA: Diagnosis not present

## 2022-12-02 DIAGNOSIS — R530 Neoplastic (malignant) related fatigue: Secondary | ICD-10-CM
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
## 2022-12-03 ENCOUNTER — Other Ambulatory Visit: Payer: Self-pay

## 2022-12-03 ENCOUNTER — Telehealth: Payer: Self-pay

## 2022-12-03 NOTE — Telephone Encounter (Signed)
Patients wife called in stating she needed [place] to contact [facility] to send paperwork for them to help cover the patient going to [facility] for a second opinion. She gave me the number to call. I contacted the [facility] and was informed that the patient has to go back to the PCP to have them start the process for the second opinion authorization. [facility] stated that the [place] doesn't usually cover a second opinion. Called the patients wife back and made her aware she voiced full understanding.
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
## 2022-12-12 IMAGING — US US SCROTUM W/ DOPPLER COMPLETE
1 series · 14 of 25 positions shown · non-contrast
Comparison: None Available.

CLINICAL DATA: Acute right scrotal swelling.

EXAM:
SCROTAL ULTRASOUND
DOPPLER ULTRASOUND OF THE TESTICLES
TECHNIQUE: Complete ultrasound examination of the testicles, epididymis, and
other scrotal structures was performed. Color and spectral Doppler
ultrasound were also utilized to evaluate blood flow to the
testicles.

[Series 1: us scrotum w/doppler · 14 of 70 slices shown]
[im 1/70]
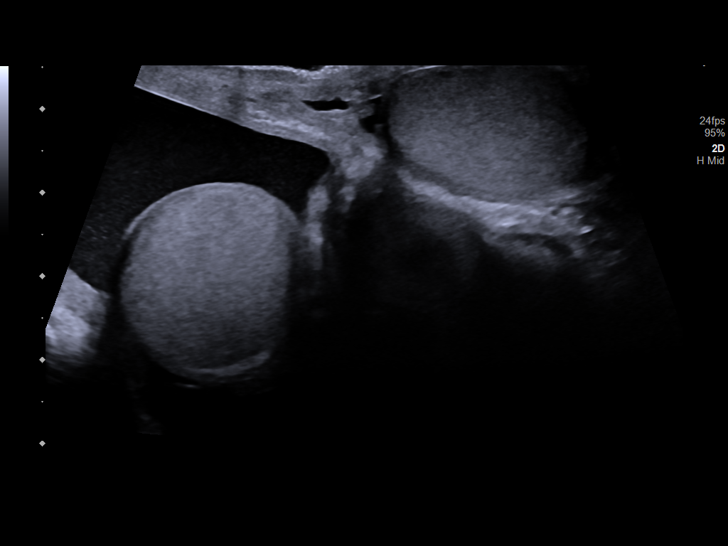
[im 6/70]
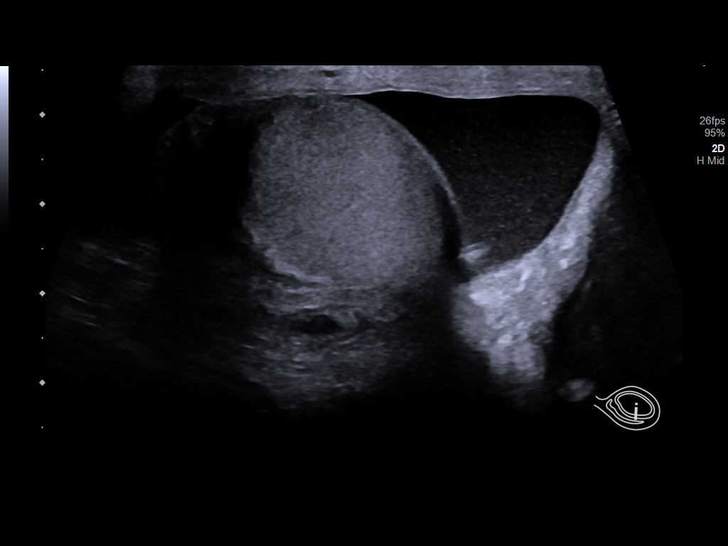
[im 12/70]
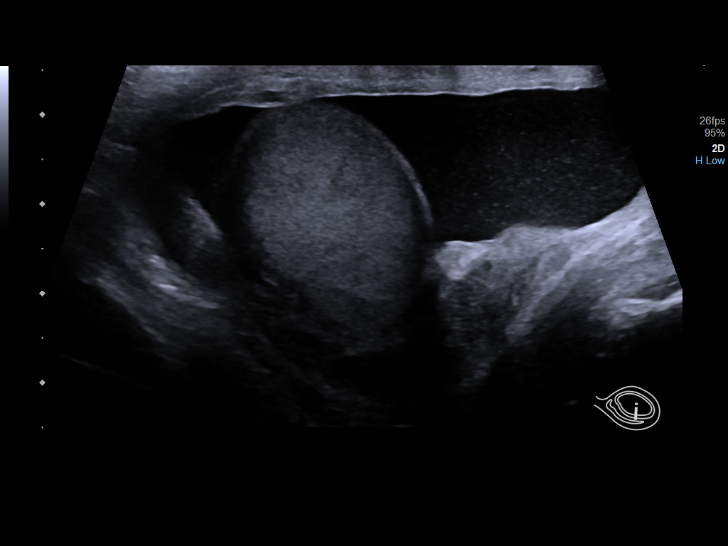
[im 18/70]
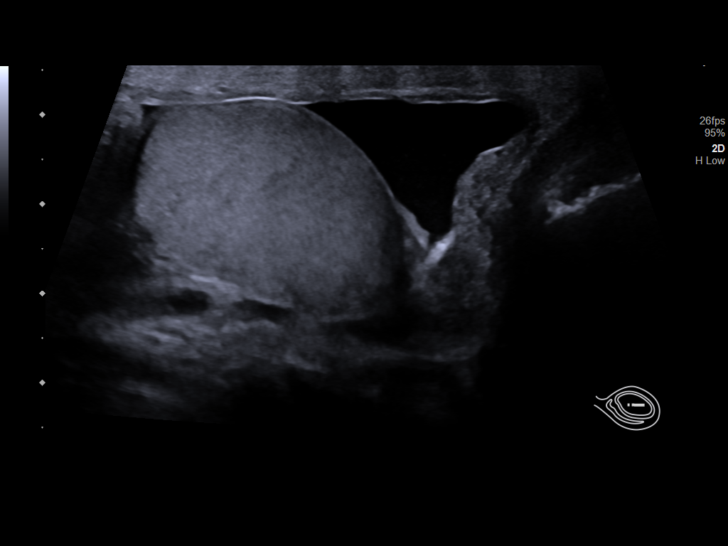
[im 24/70]
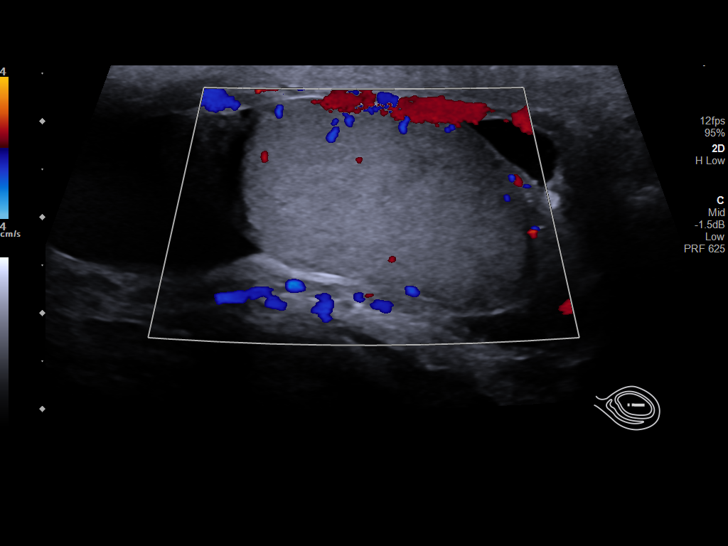
[im 26/70]
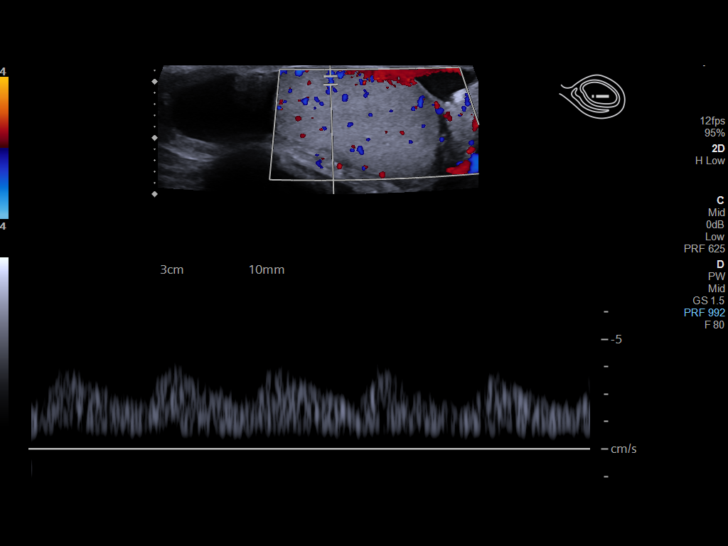
[im 32/70]
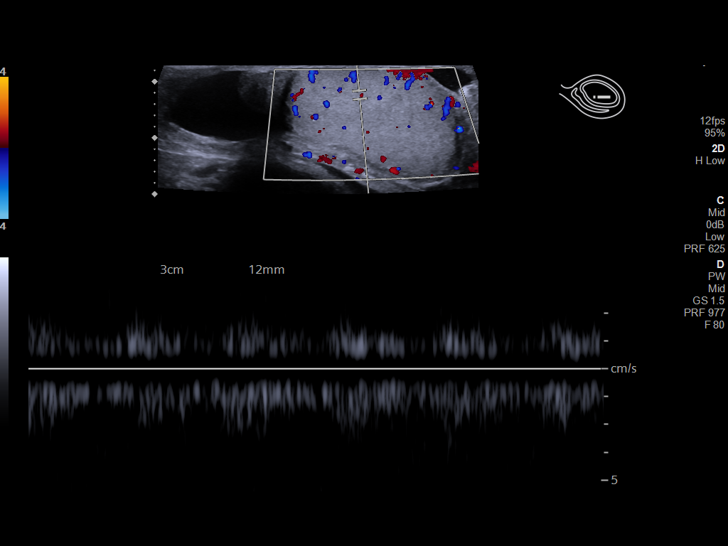
[im 38/70]
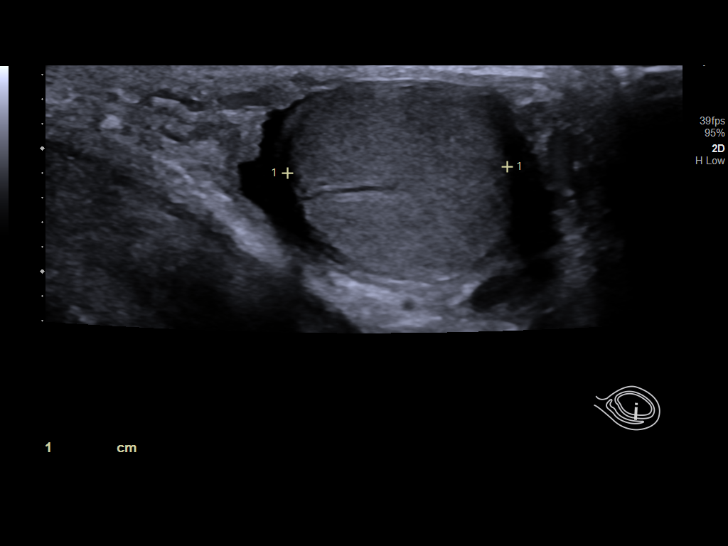
[im 44/70]
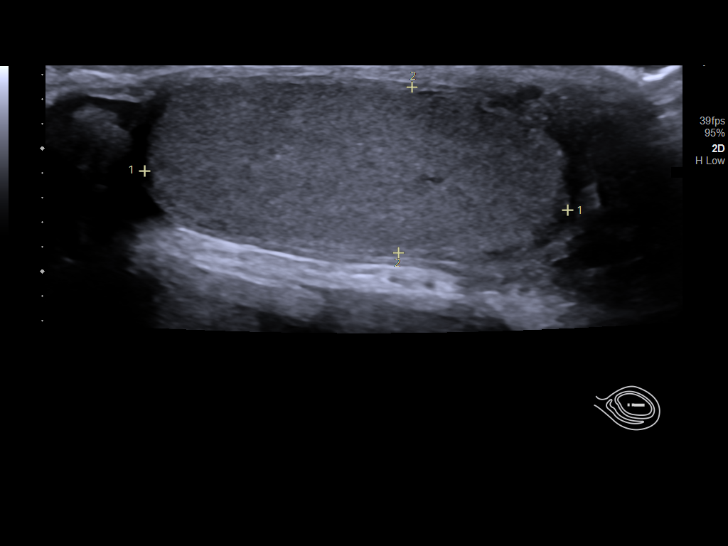
[im 47/70]
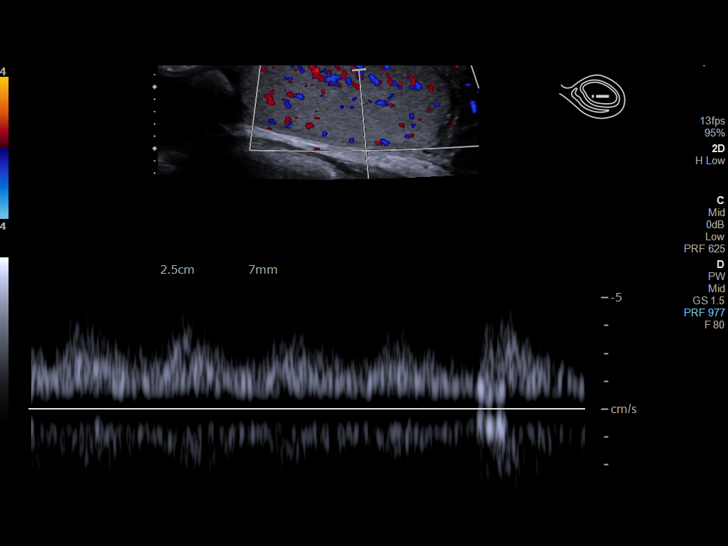
[im 52/70]
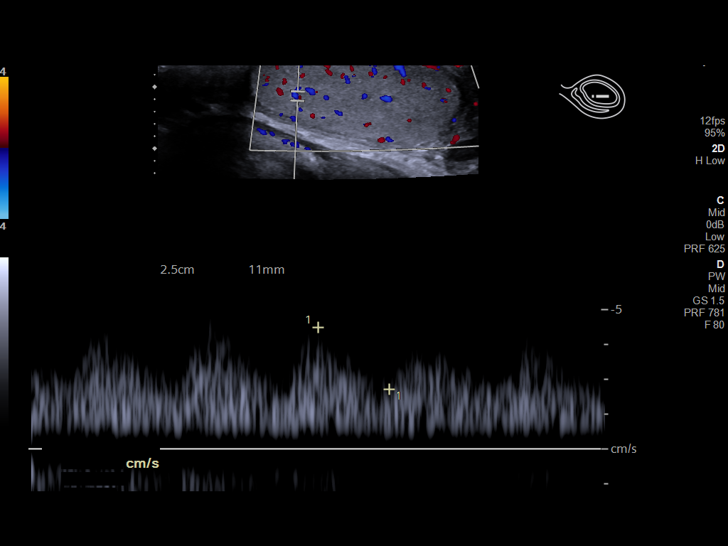
[im 58/70]
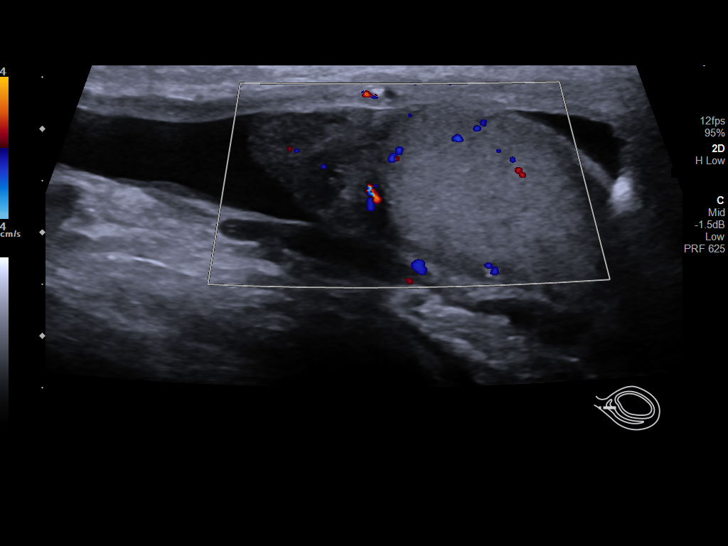
[im 64/70]
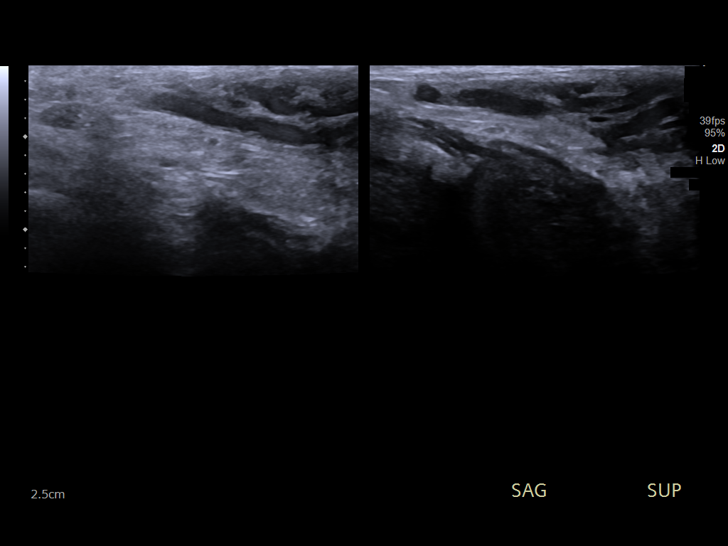
[im 70/70]
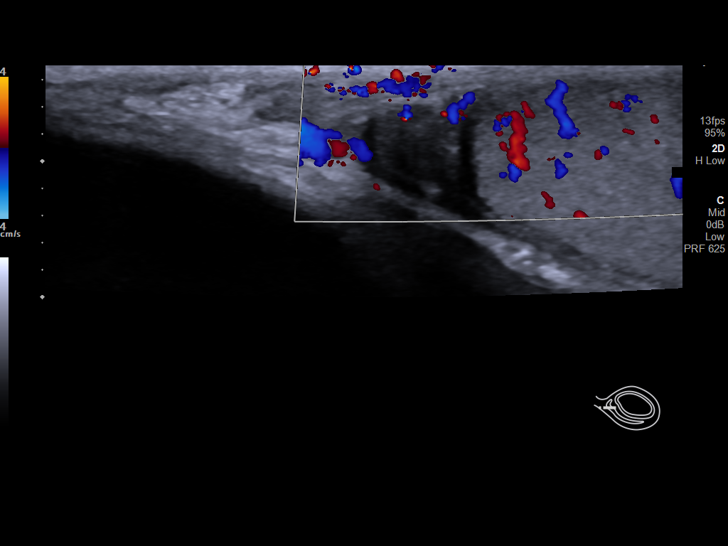

[14 of 25 positions shown; findings below may reference images not displayed]

FINDINGS: Right testicle

Measurements: 3.3 x 2.3 x 2.1 cm. No mass or microlithiasis
visualized.

Left testicle

Measurements: 3.5 x 1.4 x 1.8 cm. No mass or microlithiasis
visualized.

Right epididymis:  Normal in size and appearance.

Left epididymis:  Normal in size and appearance.

Hydrocele:  Bilateral hydroceles are noted, right greater than left.

Varicocele:  None visualized.

Pulsed Doppler interrogation of both testes demonstrates normal low
resistance arterial and venous waveforms bilaterally.
IMPRESSION: No evidence of testicular mass or torsion.

Bilateral hydroceles are noted, right greater than left.
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
## 2022-12-16 ENCOUNTER — Inpatient Hospital Stay: Payer: Medicare Other | Attending: Hematology | Admitting: Hematology

## 2022-12-16 ENCOUNTER — Other Ambulatory Visit: Payer: Self-pay

## 2022-12-16 ENCOUNTER — Inpatient Hospital Stay: Payer: Medicare Other

## 2022-12-16 ENCOUNTER — Inpatient Hospital Stay (HOSPITAL_BASED_OUTPATIENT_CLINIC_OR_DEPARTMENT_OTHER): Payer: Medicare Other | Admitting: Nurse Practitioner

## 2022-12-16 ENCOUNTER — Telehealth: Payer: Self-pay

## 2022-12-16 ENCOUNTER — Inpatient Hospital Stay: Payer: No Typology Code available for payment source

## 2022-12-16 ENCOUNTER — Encounter: Payer: Self-pay | Admitting: Nurse Practitioner

## 2022-12-16 VITALS — BP 150/77 | HR 83 | Temp 97.8°F | Resp 16 | Ht 71.0 in | Wt 128.3 lb

## 2022-12-16 DIAGNOSIS — K7469 Other cirrhosis of liver: Secondary | ICD-10-CM | POA: Diagnosis not present

## 2022-12-16 DIAGNOSIS — Z79899 Other long term (current) drug therapy: Secondary | ICD-10-CM | POA: Insufficient documentation

## 2022-12-16 DIAGNOSIS — C220 Liver cell carcinoma: Secondary | ICD-10-CM

## 2022-12-16 DIAGNOSIS — I81 Portal vein thrombosis: Secondary | ICD-10-CM | POA: Diagnosis not present

## 2022-12-16 DIAGNOSIS — G893 Neoplasm related pain (acute) (chronic): Secondary | ICD-10-CM | POA: Diagnosis not present

## 2022-12-16 DIAGNOSIS — I700 Atherosclerosis of aorta: Secondary | ICD-10-CM | POA: Diagnosis not present

## 2022-12-16 DIAGNOSIS — R530 Neoplastic (malignant) related fatigue: Secondary | ICD-10-CM

## 2022-12-16 DIAGNOSIS — I868 Varicose veins of other specified sites: Secondary | ICD-10-CM | POA: Insufficient documentation

## 2022-12-16 DIAGNOSIS — Z8619 Personal history of other infectious and parasitic diseases: Secondary | ICD-10-CM | POA: Diagnosis not present

## 2022-12-16 DIAGNOSIS — K7682 Hepatic encephalopathy: Secondary | ICD-10-CM | POA: Diagnosis not present

## 2022-12-16 DIAGNOSIS — I864 Gastric varices: Secondary | ICD-10-CM | POA: Insufficient documentation

## 2022-12-16 DIAGNOSIS — Z515 Encounter for palliative care: Secondary | ICD-10-CM

## 2022-12-16 DIAGNOSIS — E860 Dehydration: Secondary | ICD-10-CM

## 2022-12-16 DIAGNOSIS — B1920 Unspecified viral hepatitis C without hepatic coma: Secondary | ICD-10-CM | POA: Insufficient documentation

## 2022-12-16 DIAGNOSIS — D649 Anemia, unspecified: Secondary | ICD-10-CM

## 2022-12-16 LAB — CMP (CANCER CENTER ONLY)
ALT: 110 U/L — ABNORMAL HIGH (ref 0–44)
AST: 189 U/L (ref 15–41)
Albumin: 3.1 g/dL — ABNORMAL LOW (ref 3.5–5.0)
Alkaline Phosphatase: 404 U/L — ABNORMAL HIGH (ref 38–126)
Anion gap: 5 (ref 5–15)
BUN: 24 mg/dL — ABNORMAL HIGH (ref 8–23)
CO2: 23 mmol/L (ref 22–32)
Calcium: 9.4 mg/dL (ref 8.9–10.3)
Chloride: 104 mmol/L (ref 98–111)
Creatinine: 0.83 mg/dL (ref 0.61–1.24)
GFR, Estimated: >60 mL/min (ref >60)
Glucose, Bld: 173 mg/dL — ABNORMAL HIGH (ref 70–99)
Potassium: 4.4 mmol/L (ref 3.5–5.1)
Sodium: 132 mmol/L — ABNORMAL LOW (ref 135–145)
Total Bilirubin: 1.2 mg/dL (ref 0.3–1.2)
Total Protein: 7.5 g/dL (ref 6.5–8.1)

## 2022-12-16 LAB — CBC WITH DIFFERENTIAL (CANCER CENTER ONLY)
Abs Immature Granulocytes: 0.01 10*3/uL (ref 0.00–0.07)
Basophils Absolute: 0 10*3/uL (ref 0.0–0.1)
Basophils Relative: 1 %
Eosinophils Absolute: 0.1 10*3/uL (ref 0.0–0.5)
Eosinophils Relative: 2 %
HCT: 36.5 % — ABNORMAL LOW (ref 39.0–52.0)
Hemoglobin: 12.7 g/dL — ABNORMAL LOW (ref 13.0–17.0)
Immature Granulocytes: 0 %
Lymphocytes Relative: 20 %
Lymphs Abs: 0.7 10*3/uL (ref 0.7–4.0)
MCH: 32.1 pg (ref 26.0–34.0)
MCHC: 34.8 g/dL (ref 30.0–36.0)
MCV: 92.2 fL (ref 80.0–100.0)
Monocytes Absolute: 0.5 10*3/uL (ref 0.1–1.0)
Monocytes Relative: 12 %
Neutro Abs: 2.4 10*3/uL (ref 1.7–7.7)
Neutrophils Relative %: 65 %
Platelet Count: 80 10*3/uL — ABNORMAL LOW (ref 150–400)
RBC: 3.96 MIL/uL — ABNORMAL LOW (ref 4.22–5.81)
RDW: 15.1 % (ref 11.5–15.5)
WBC Count: 3.7 10*3/uL — ABNORMAL LOW (ref 4.0–10.5)
nRBC: 0 % (ref 0.0–0.2)

## 2022-12-16 LAB — FERRITIN: Ferritin: 71 ng/mL (ref 24–336)

## 2022-12-16 MED ORDER — HEPARIN SOD (PORK) LOCK FLUSH 100 UNIT/ML IV SOLN
500.0000 [IU] | Freq: Once | INTRAVENOUS | Status: AC
  Administered 2022-12-16: 500 [IU]

## 2022-12-16 MED ORDER — SODIUM CHLORIDE 0.9% FLUSH
10.0000 mL | Freq: Once | INTRAVENOUS | Status: AC
  Administered 2022-12-16: 10 mL

## 2022-12-16 NOTE — Telephone Encounter (Signed)
Critical Lab Value reported:  AST 189  Dr. [name] notified.

## 2022-12-16 NOTE — Assessment & Plan Note (Signed)
cT3N1M0, stage IVA, G3 -history of cirrhosis since ~2011 and hepatitis C diagnosed and treated in 2018 -diagnosed by liver biopsy on 11/04/21, baseline AFP normal  -s/p palliative radiation under Dr. Mitzi Hansen, 7/20-12/17/21 -he began Tecentriq and bevacizumab on 12/06/21. Tolerating well overall. 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 Milford Cage. -Restaging CT scan from April 21, 2022 showed excellent partial response to treatment, reviewed with patient and his wife today. -treatment held in Dec 2023 due to hospital admission for N/V and poor oral intake. Restarted on 05/14/2022 -Restaging CT abdomen pelvis from August 25, 2022 showed mixed response to treatment. Restaging CT on 11/13/2021 showed further disease progression -I changed his treatment to [facility], unfortunately he tolerated poorly and ended up in the hospital with hepatic encephalopathy after first week of Lenvima, his liver function also got worse.
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
## 2022-12-26 ENCOUNTER — Encounter: Payer: Self-pay | Admitting: Hematology

## 2023-01-13 ENCOUNTER — Inpatient Hospital Stay (HOSPITAL_BASED_OUTPATIENT_CLINIC_OR_DEPARTMENT_OTHER): Payer: No Typology Code available for payment source | Admitting: Hematology

## 2023-01-13 ENCOUNTER — Inpatient Hospital Stay: Payer: No Typology Code available for payment source | Attending: Hematology

## 2023-01-13 ENCOUNTER — Other Ambulatory Visit: Payer: Self-pay

## 2023-01-13 ENCOUNTER — Inpatient Hospital Stay (HOSPITAL_BASED_OUTPATIENT_CLINIC_OR_DEPARTMENT_OTHER): Payer: Non-veteran care | Admitting: Nurse Practitioner

## 2023-01-13 VITALS — BP 148/91 | HR 78 | Temp 97.6°F | Resp 17 | Wt 134.6 lb

## 2023-01-13 DIAGNOSIS — I864 Gastric varices: Secondary | ICD-10-CM | POA: Diagnosis not present

## 2023-01-13 DIAGNOSIS — Z7901 Long term (current) use of anticoagulants: Secondary | ICD-10-CM | POA: Diagnosis not present

## 2023-01-13 DIAGNOSIS — K766 Portal hypertension: Secondary | ICD-10-CM | POA: Diagnosis not present

## 2023-01-13 DIAGNOSIS — E860 Dehydration: Secondary | ICD-10-CM

## 2023-01-13 DIAGNOSIS — E119 Type 2 diabetes mellitus without complications: Secondary | ICD-10-CM | POA: Diagnosis not present

## 2023-01-13 DIAGNOSIS — Z515 Encounter for palliative care: Secondary | ICD-10-CM | POA: Diagnosis not present

## 2023-01-13 DIAGNOSIS — K209 Esophagitis, unspecified without bleeding: Secondary | ICD-10-CM | POA: Insufficient documentation

## 2023-01-13 DIAGNOSIS — Z79899 Other long term (current) drug therapy: Secondary | ICD-10-CM | POA: Diagnosis not present

## 2023-01-13 DIAGNOSIS — D649 Anemia, unspecified: Secondary | ICD-10-CM

## 2023-01-13 DIAGNOSIS — K31811 Angiodysplasia of stomach and duodenum with bleeding: Secondary | ICD-10-CM | POA: Diagnosis not present

## 2023-01-13 DIAGNOSIS — N400 Enlarged prostate without lower urinary tract symptoms: Secondary | ICD-10-CM | POA: Insufficient documentation

## 2023-01-13 DIAGNOSIS — I700 Atherosclerosis of aorta: Secondary | ICD-10-CM | POA: Insufficient documentation

## 2023-01-13 DIAGNOSIS — K828 Other specified diseases of gallbladder: Secondary | ICD-10-CM | POA: Insufficient documentation

## 2023-01-13 DIAGNOSIS — Z87891 Personal history of nicotine dependence: Secondary | ICD-10-CM | POA: Insufficient documentation

## 2023-01-13 DIAGNOSIS — K7469 Other cirrhosis of liver: Secondary | ICD-10-CM | POA: Diagnosis not present

## 2023-01-13 DIAGNOSIS — M25519 Pain in unspecified shoulder: Secondary | ICD-10-CM | POA: Insufficient documentation

## 2023-01-13 DIAGNOSIS — M25559 Pain in unspecified hip: Secondary | ICD-10-CM | POA: Insufficient documentation

## 2023-01-13 DIAGNOSIS — M25529 Pain in unspecified elbow: Secondary | ICD-10-CM | POA: Insufficient documentation

## 2023-01-13 DIAGNOSIS — Z8619 Personal history of other infectious and parasitic diseases: Secondary | ICD-10-CM | POA: Diagnosis not present

## 2023-01-13 DIAGNOSIS — I868 Varicose veins of other specified sites: Secondary | ICD-10-CM | POA: Insufficient documentation

## 2023-01-13 DIAGNOSIS — D62 Acute posthemorrhagic anemia: Secondary | ICD-10-CM | POA: Insufficient documentation

## 2023-01-13 DIAGNOSIS — C220 Liver cell carcinoma: Secondary | ICD-10-CM

## 2023-01-13 DIAGNOSIS — G893 Neoplasm related pain (acute) (chronic): Secondary | ICD-10-CM | POA: Diagnosis not present

## 2023-01-13 DIAGNOSIS — I81 Portal vein thrombosis: Secondary | ICD-10-CM | POA: Insufficient documentation

## 2023-01-13 DIAGNOSIS — B182 Chronic viral hepatitis C: Secondary | ICD-10-CM | POA: Diagnosis not present

## 2023-01-13 DIAGNOSIS — K259 Gastric ulcer, unspecified as acute or chronic, without hemorrhage or perforation: Secondary | ICD-10-CM | POA: Insufficient documentation

## 2023-01-13 DIAGNOSIS — K7682 Hepatic encephalopathy: Secondary | ICD-10-CM | POA: Insufficient documentation

## 2023-01-13 LAB — CMP (CANCER CENTER ONLY)
ALT: 67 U/L — ABNORMAL HIGH (ref 0–44)
AST: 130 U/L — ABNORMAL HIGH (ref 15–41)
Albumin: 3 g/dL — ABNORMAL LOW (ref 3.5–5.0)
Alkaline Phosphatase: 296 U/L — ABNORMAL HIGH (ref 38–126)
Anion gap: 6 (ref 5–15)
BUN: 23 mg/dL (ref 8–23)
CO2: 24 mmol/L (ref 22–32)
Calcium: 9.5 mg/dL (ref 8.9–10.3)
Chloride: 103 mmol/L (ref 98–111)
Creatinine: 0.96 mg/dL (ref 0.61–1.24)
GFR, Estimated: >60 mL/min (ref >60)
Glucose, Bld: 186 mg/dL — ABNORMAL HIGH (ref 70–99)
Potassium: 4.4 mmol/L (ref 3.5–5.1)
Sodium: 133 mmol/L — ABNORMAL LOW (ref 135–145)
Total Bilirubin: 1.3 mg/dL — ABNORMAL HIGH (ref 0.3–1.2)
Total Protein: 7.6 g/dL (ref 6.5–8.1)

## 2023-01-13 LAB — CBC WITH DIFFERENTIAL (CANCER CENTER ONLY)
Abs Immature Granulocytes: 0.02 10*3/uL (ref 0.00–0.07)
Basophils Absolute: 0 10*3/uL (ref 0.0–0.1)
Basophils Relative: 1 %
Eosinophils Absolute: 0.1 10*3/uL (ref 0.0–0.5)
Eosinophils Relative: 1 %
HCT: 36.9 % — ABNORMAL LOW (ref 39.0–52.0)
Hemoglobin: 12.4 g/dL — ABNORMAL LOW (ref 13.0–17.0)
Immature Granulocytes: 1 %
Lymphocytes Relative: 15 %
Lymphs Abs: 0.6 10*3/uL — ABNORMAL LOW (ref 0.7–4.0)
MCH: 31.2 pg (ref 26.0–34.0)
MCHC: 33.6 g/dL (ref 30.0–36.0)
MCV: 92.7 fL (ref 80.0–100.0)
Monocytes Absolute: 0.5 10*3/uL (ref 0.1–1.0)
Monocytes Relative: 12 %
Neutro Abs: 3 10*3/uL (ref 1.7–7.7)
Neutrophils Relative %: 70 %
Platelet Count: 86 10*3/uL — ABNORMAL LOW (ref 150–400)
RBC: 3.98 MIL/uL — ABNORMAL LOW (ref 4.22–5.81)
RDW: 14.5 % (ref 11.5–15.5)
WBC Count: 4.2 10*3/uL (ref 4.0–10.5)
nRBC: 0 % (ref 0.0–0.2)

## 2023-01-13 LAB — FERRITIN: Ferritin: 39 ng/mL (ref 24–336)

## 2023-01-13 MED ORDER — HEPARIN SOD (PORK) LOCK FLUSH 100 UNIT/ML IV SOLN
500.0000 [IU] | Freq: Once | INTRAVENOUS | Status: AC | PRN
  Administered 2023-01-13: 500 [IU]

## 2023-01-13 MED ORDER — SODIUM CHLORIDE 0.9% FLUSH
10.0000 mL | Freq: Once | INTRAVENOUS | Status: AC | PRN
  Administered 2023-01-13: 10 mL

## 2023-01-13 NOTE — Assessment & Plan Note (Signed)
cT3N1M0, stage IVA, G3 -history of cirrhosis since ~2011 and hepatitis C diagnosed and treated in 2018 -diagnosed by liver biopsy on 11/04/21, baseline AFP normal  -s/p palliative radiation under Dr. Mitzi Hansen, 7/20-12/17/21 -he began Tecentriq and bevacizumab on 12/06/21. Tolerating well overall. 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 Milford Cage. -Restaging CT scan from April 21, 2022 showed excellent partial response to treatment, reviewed with patient and his wife today. -treatment held in Dec 2023 due to hospital admission for N/V and poor oral intake. Restarted on 05/14/2022 -Restaging CT abdomen pelvis from August 25, 2022 showed mixed response to treatment. Restaging CT on 11/13/2021 showed further disease progression -I changed his treatment to [facility], unfortunately he tolerated poorly and ended up in the hospital with hepatic encephalopathy after first week of Lenvima, his liver function also got worse.  He did recover well after he came off Lenvima -He is on surveillance now.
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
## 2023-01-14 ENCOUNTER — Encounter: Payer: Self-pay | Admitting: Hematology

## 2023-01-27 ENCOUNTER — Ambulatory Visit (HOSPITAL_COMMUNITY)
Admission: RE | Admit: 2023-01-27 | Discharge: 2023-01-27 | Disposition: A | Discharge status: Home / Self Care | Payer: No Typology Code available for payment source | Source: Ambulatory Visit | Attending: Hematology | Admitting: Hematology

## 2023-01-27 DIAGNOSIS — C220 Liver cell carcinoma: Secondary | ICD-10-CM | POA: Diagnosis present

## 2023-01-27 MED ORDER — IOHEXOL 300 MG/ML  SOLN
100.0000 mL | Freq: Once | INTRAMUSCULAR | Status: AC | PRN
  Administered 2023-01-27: 100 mL via INTRAVENOUS

## 2023-01-27 MED ORDER — HEPARIN SOD (PORK) LOCK FLUSH 100 UNIT/ML IV SOLN
500.0000 [IU] | Freq: Once | INTRAVENOUS | Status: AC
  Administered 2023-01-27: 500 [IU] via INTRAVENOUS

## 2023-01-28 ENCOUNTER — Encounter: Payer: Self-pay | Admitting: Hematology

## 2023-02-03 ENCOUNTER — Inpatient Hospital Stay (HOSPITAL_BASED_OUTPATIENT_CLINIC_OR_DEPARTMENT_OTHER): Payer: No Typology Code available for payment source | Admitting: Hematology

## 2023-02-03 DIAGNOSIS — C220 Liver cell carcinoma: Secondary | ICD-10-CM
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
## 2023-02-03 NOTE — Assessment & Plan Note (Signed)
cT3N1M0, stage IVA, G3 -history of cirrhosis since ~2011 and hepatitis C diagnosed and treated in 2018 -diagnosed by liver biopsy on 11/04/21, baseline AFP normal  -s/p palliative radiation under Dr. Mitzi Hansen, 7/20-12/17/21 -he began Tecentriq and bevacizumab on 12/06/21. Tolerating well overall. 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 Milford Cage. -Restaging CT scan from April 21, 2022 showed excellent partial response to treatment, reviewed with patient and his wife today. -treatment held in Dec 2023 due to hospital admission for N/V and poor oral intake. Restarted on 05/14/2022 -Restaging CT abdomen pelvis from August 25, 2022 showed mixed response to treatment. Restaging CT on 11/13/2021 showed further disease progression -I changed his treatment to [facility], unfortunately he tolerated poorly and ended up in the hospital with hepatic encephalopathy after first week of Lenvima, his liver function also got worse.  He did recover well after he came off Lenvima -He is on surveillance now.

## 2023-02-06 ENCOUNTER — Other Ambulatory Visit: Payer: Self-pay
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
## 2023-02-23 NOTE — Assessment & Plan Note (Addendum)
cT3N1M0, stage IVA, G3 -history of cirrhosis since ~2011 and hepatitis C diagnosed and treated in 2018 -diagnosed by liver biopsy on 11/04/21, baseline AFP normal  -s/p palliative radiation under Dr. Mitzi Hansen, 7/20-12/17/21 -he began Tecentriq and bevacizumab on 12/06/21. Tolerating well overall. 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 Milford Cage. -Restaging CT scan from April 21, 2022 showed excellent partial response to treatment, reviewed with patient and his wife today. -treatment held in Dec 2023 due to hospital admission for N/V and poor oral intake. Restarted on 05/14/2022 -Restaging CT abdomen pelvis from August 25, 2022 showed mixed response to treatment. Restaging CT on 11/13/2021 showed further disease progression -I changed his treatment to [facility], unfortunately he tolerated poorly and ended up in the hospital with hepatic encephalopathy after first week of Lenvima, his liver function also got worse.  He did recover well after he came off Lenvima -He is on surveillance now. -restaging CT scan from January 27, 2023, which showed stable liver lesions but progression of abdominal lymphadenopathy

## 2023-02-24 ENCOUNTER — Encounter: Payer: Self-pay | Admitting: Nurse Practitioner

## 2023-02-24 ENCOUNTER — Encounter: Payer: Self-pay | Admitting: Hematology

## 2023-02-24 ENCOUNTER — Inpatient Hospital Stay: Payer: No Typology Code available for payment source

## 2023-02-24 ENCOUNTER — Inpatient Hospital Stay: Payer: No Typology Code available for payment source | Attending: Hematology | Admitting: Hematology

## 2023-02-24 ENCOUNTER — Other Ambulatory Visit: Payer: Self-pay

## 2023-02-24 ENCOUNTER — Inpatient Hospital Stay (HOSPITAL_BASED_OUTPATIENT_CLINIC_OR_DEPARTMENT_OTHER): Payer: No Typology Code available for payment source | Admitting: Nurse Practitioner

## 2023-02-24 VITALS — BP 136/81 | HR 77 | Temp 98.1°F | Resp 18 | Ht 71.0 in | Wt 143.3 lb

## 2023-02-24 DIAGNOSIS — I864 Gastric varices: Secondary | ICD-10-CM | POA: Insufficient documentation

## 2023-02-24 DIAGNOSIS — Z79899 Other long term (current) drug therapy: Secondary | ICD-10-CM | POA: Diagnosis not present

## 2023-02-24 DIAGNOSIS — K449 Diaphragmatic hernia without obstruction or gangrene: Secondary | ICD-10-CM | POA: Insufficient documentation

## 2023-02-24 DIAGNOSIS — C220 Liver cell carcinoma: Secondary | ICD-10-CM | POA: Insufficient documentation

## 2023-02-24 DIAGNOSIS — Z7189 Other specified counseling: Secondary | ICD-10-CM

## 2023-02-24 DIAGNOSIS — I10 Essential (primary) hypertension: Secondary | ICD-10-CM | POA: Diagnosis not present

## 2023-02-24 DIAGNOSIS — I700 Atherosclerosis of aorta: Secondary | ICD-10-CM | POA: Diagnosis not present

## 2023-02-24 DIAGNOSIS — I868 Varicose veins of other specified sites: Secondary | ICD-10-CM | POA: Insufficient documentation

## 2023-02-24 DIAGNOSIS — K7682 Hepatic encephalopathy: Secondary | ICD-10-CM | POA: Diagnosis not present

## 2023-02-24 DIAGNOSIS — R530 Neoplastic (malignant) related fatigue: Secondary | ICD-10-CM

## 2023-02-24 DIAGNOSIS — Z8619 Personal history of other infectious and parasitic diseases: Secondary | ICD-10-CM | POA: Insufficient documentation

## 2023-02-24 DIAGNOSIS — R609 Edema, unspecified: Secondary | ICD-10-CM | POA: Diagnosis not present

## 2023-02-24 DIAGNOSIS — R590 Localized enlarged lymph nodes: Secondary | ICD-10-CM | POA: Insufficient documentation

## 2023-02-24 DIAGNOSIS — K7469 Other cirrhosis of liver: Secondary | ICD-10-CM | POA: Diagnosis not present

## 2023-02-24 DIAGNOSIS — N400 Enlarged prostate without lower urinary tract symptoms: Secondary | ICD-10-CM | POA: Diagnosis not present

## 2023-02-24 DIAGNOSIS — K828 Other specified diseases of gallbladder: Secondary | ICD-10-CM | POA: Diagnosis not present

## 2023-02-24 DIAGNOSIS — Z515 Encounter for palliative care: Secondary | ICD-10-CM

## 2023-02-24 DIAGNOSIS — B1920 Unspecified viral hepatitis C without hepatic coma: Secondary | ICD-10-CM | POA: Diagnosis not present

## 2023-02-24 DIAGNOSIS — K766 Portal hypertension: Secondary | ICD-10-CM | POA: Insufficient documentation

## 2023-02-24 DIAGNOSIS — Z87891 Personal history of nicotine dependence: Secondary | ICD-10-CM | POA: Diagnosis not present

## 2023-02-24 DIAGNOSIS — D649 Anemia, unspecified: Secondary | ICD-10-CM

## 2023-02-24 DIAGNOSIS — M1990 Unspecified osteoarthritis, unspecified site: Secondary | ICD-10-CM | POA: Diagnosis not present

## 2023-02-24 DIAGNOSIS — I81 Portal vein thrombosis: Secondary | ICD-10-CM | POA: Insufficient documentation

## 2023-02-24 DIAGNOSIS — E860 Dehydration: Secondary | ICD-10-CM

## 2023-02-24 LAB — CBC WITH DIFFERENTIAL (CANCER CENTER ONLY)
Abs Immature Granulocytes: 0.02 10*3/uL (ref 0.00–0.07)
Basophils Absolute: 0 10*3/uL (ref 0.0–0.1)
Basophils Relative: 1 %
Eosinophils Absolute: 0.1 10*3/uL (ref 0.0–0.5)
Eosinophils Relative: 2 %
HCT: 33.3 % — ABNORMAL LOW (ref 39.0–52.0)
Hemoglobin: 11.4 g/dL — ABNORMAL LOW (ref 13.0–17.0)
Immature Granulocytes: 1 %
Lymphocytes Relative: 16 %
Lymphs Abs: 0.7 10*3/uL (ref 0.7–4.0)
MCH: 32 pg (ref 26.0–34.0)
MCHC: 34.2 g/dL (ref 30.0–36.0)
MCV: 93.5 fL (ref 80.0–100.0)
Monocytes Absolute: 0.5 10*3/uL (ref 0.1–1.0)
Monocytes Relative: 11 %
Neutro Abs: 3 10*3/uL (ref 1.7–7.7)
Neutrophils Relative %: 69 %
Platelet Count: 86 10*3/uL — ABNORMAL LOW (ref 150–400)
RBC: 3.56 MIL/uL — ABNORMAL LOW (ref 4.22–5.81)
RDW: 13.7 % (ref 11.5–15.5)
WBC Count: 4.3 10*3/uL (ref 4.0–10.5)
nRBC: 0 % (ref 0.0–0.2)

## 2023-02-24 LAB — CMP (CANCER CENTER ONLY)
ALT: 47 U/L — ABNORMAL HIGH (ref 0–44)
AST: 124 U/L — ABNORMAL HIGH (ref 15–41)
Albumin: 3 g/dL — ABNORMAL LOW (ref 3.5–5.0)
Alkaline Phosphatase: 233 U/L — ABNORMAL HIGH (ref 38–126)
Anion gap: 5 (ref 5–15)
BUN: 27 mg/dL — ABNORMAL HIGH (ref 8–23)
CO2: 24 mmol/L (ref 22–32)
Calcium: 9.5 mg/dL (ref 8.9–10.3)
Chloride: 108 mmol/L (ref 98–111)
Creatinine: 1.18 mg/dL (ref 0.61–1.24)
GFR, Estimated: >60 mL/min (ref >60)
Glucose, Bld: 207 mg/dL — ABNORMAL HIGH (ref 70–99)
Potassium: 4.2 mmol/L (ref 3.5–5.1)
Sodium: 137 mmol/L (ref 135–145)
Total Bilirubin: 1.1 mg/dL (ref 0.3–1.2)
Total Protein: 8 g/dL (ref 6.5–8.1)

## 2023-02-24 LAB — FERRITIN: Ferritin: 46 ng/mL (ref 24–336)

## 2023-02-24 MED ORDER — SODIUM CHLORIDE 0.9% FLUSH
10.0000 mL | Freq: Once | INTRAVENOUS | Status: AC | PRN
  Administered 2023-02-24: 10 mL

## 2023-02-24 MED ORDER — HEPARIN SOD (PORK) LOCK FLUSH 100 UNIT/ML IV SOLN
500.0000 [IU] | Freq: Once | INTRAVENOUS | Status: AC | PRN
  Administered 2023-02-24: 500 [IU]
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
## 2023-02-26 ENCOUNTER — Other Ambulatory Visit: Payer: Self-pay

## 2023-02-26 NOTE — Progress Notes (Signed)
Faxed last office note for Dr. [name] and Iva Boop to [facility].

## 2023-03-10 ENCOUNTER — Encounter (HOSPITAL_COMMUNITY): Payer: Self-pay

## 2023-03-10 ENCOUNTER — Other Ambulatory Visit: Payer: Self-pay

## 2023-03-10 ENCOUNTER — Emergency Department (HOSPITAL_COMMUNITY)
Admission: EM | Admit: 2023-03-10 | Discharge: 2023-03-11 | Discharge status: Against Medical Advice | Payer: No Typology Code available for payment source | Attending: Emergency Medicine | Admitting: Emergency Medicine

## 2023-03-10 DIAGNOSIS — R109 Unspecified abdominal pain: Secondary | ICD-10-CM | POA: Insufficient documentation

## 2023-03-10 DIAGNOSIS — R0602 Shortness of breath: Secondary | ICD-10-CM | POA: Diagnosis not present

## 2023-03-10 DIAGNOSIS — R0789 Other chest pain: Secondary | ICD-10-CM | POA: Diagnosis not present

## 2023-03-10 DIAGNOSIS — Z5321 Procedure and treatment not carried out due to patient leaving prior to being seen by health care provider: Secondary | ICD-10-CM | POA: Insufficient documentation

## 2023-03-10 LAB — COMPREHENSIVE METABOLIC PANEL
ALT: 50 U/L — ABNORMAL HIGH (ref 0–44)
AST: 114 U/L — ABNORMAL HIGH (ref 15–41)
Albumin: 2.6 g/dL — ABNORMAL LOW (ref 3.5–5.0)
Alkaline Phosphatase: 225 U/L — ABNORMAL HIGH (ref 38–126)
Anion gap: 9 (ref 5–15)
BUN: 26 mg/dL — ABNORMAL HIGH (ref 8–23)
CO2: 22 mmol/L (ref 22–32)
Calcium: 9.4 mg/dL (ref 8.9–10.3)
Chloride: 103 mmol/L (ref 98–111)
Creatinine, Ser: 1 mg/dL (ref 0.61–1.24)
GFR, Estimated: >60 mL/min (ref >60)
Glucose, Bld: 221 mg/dL — ABNORMAL HIGH (ref 70–99)
Potassium: 4.1 mmol/L (ref 3.5–5.1)
Sodium: 134 mmol/L — ABNORMAL LOW (ref 135–145)
Total Bilirubin: 1.6 mg/dL — ABNORMAL HIGH (ref 0.3–1.2)
Total Protein: 8 g/dL (ref 6.5–8.1)

## 2023-03-10 LAB — CBC
HCT: 36.2 % — ABNORMAL LOW (ref 39.0–52.0)
Hemoglobin: 12.1 g/dL — ABNORMAL LOW (ref 13.0–17.0)
MCH: 31.8 pg (ref 26.0–34.0)
MCHC: 33.4 g/dL (ref 30.0–36.0)
MCV: 95 fL (ref 80.0–100.0)
Platelets: 88 10*3/uL — ABNORMAL LOW (ref 150–400)
RBC: 3.81 MIL/uL — ABNORMAL LOW (ref 4.22–5.81)
RDW: 13.5 % (ref 11.5–15.5)
WBC: 6.2 10*3/uL (ref 4.0–10.5)
nRBC: 0 % (ref 0.0–0.2)

## 2023-03-10 LAB — LIPASE, BLOOD: Lipase: 39 U/L (ref 11–51)

## 2023-03-10 NOTE — ED Triage Notes (Signed)
Pt presents via POV c/o left sided abd pain extending to the left flank x2 days. Also reports chest tightness and SOB during assessment. Pt speaking in full sentences.

## 2023-03-11 ENCOUNTER — Observation Stay (HOSPITAL_COMMUNITY)
Admission: EM | Admit: 2023-03-11 | Discharge: 2023-03-12 | Disposition: A | Discharge status: Home / Self Care | Payer: Medicare Other | Attending: Internal Medicine | Admitting: Internal Medicine

## 2023-03-11 ENCOUNTER — Other Ambulatory Visit: Payer: Self-pay

## 2023-03-11 ENCOUNTER — Encounter: Payer: Self-pay | Admitting: Hematology

## 2023-03-11 ENCOUNTER — Emergency Department (HOSPITAL_COMMUNITY): Payer: Medicare Other

## 2023-03-11 ENCOUNTER — Encounter (HOSPITAL_COMMUNITY): Payer: Self-pay | Admitting: Emergency Medicine

## 2023-03-11 DIAGNOSIS — N1832 Chronic kidney disease, stage 3b: Secondary | ICD-10-CM | POA: Diagnosis not present

## 2023-03-11 DIAGNOSIS — Z87891 Personal history of nicotine dependence: Secondary | ICD-10-CM | POA: Diagnosis not present

## 2023-03-11 DIAGNOSIS — R1032 Left lower quadrant pain: Secondary | ICD-10-CM | POA: Diagnosis present

## 2023-03-11 DIAGNOSIS — Z8505 Personal history of malignant neoplasm of liver: Secondary | ICD-10-CM | POA: Insufficient documentation

## 2023-03-11 DIAGNOSIS — Z7984 Long term (current) use of oral hypoglycemic drugs: Secondary | ICD-10-CM | POA: Diagnosis not present

## 2023-03-11 DIAGNOSIS — E1122 Type 2 diabetes mellitus with diabetic chronic kidney disease: Secondary | ICD-10-CM | POA: Insufficient documentation

## 2023-03-11 DIAGNOSIS — D696 Thrombocytopenia, unspecified: Secondary | ICD-10-CM | POA: Diagnosis present

## 2023-03-11 DIAGNOSIS — K31819 Angiodysplasia of stomach and duodenum without bleeding: Secondary | ICD-10-CM | POA: Diagnosis present

## 2023-03-11 DIAGNOSIS — I8510 Secondary esophageal varices without bleeding: Secondary | ICD-10-CM | POA: Diagnosis not present

## 2023-03-11 DIAGNOSIS — I129 Hypertensive chronic kidney disease with stage 1 through stage 4 chronic kidney disease, or unspecified chronic kidney disease: Secondary | ICD-10-CM | POA: Insufficient documentation

## 2023-03-11 DIAGNOSIS — Z794 Long term (current) use of insulin: Secondary | ICD-10-CM | POA: Diagnosis not present

## 2023-03-11 DIAGNOSIS — Z79899 Other long term (current) drug therapy: Secondary | ICD-10-CM | POA: Diagnosis not present

## 2023-03-11 DIAGNOSIS — I81 Portal vein thrombosis: Principal | ICD-10-CM | POA: Diagnosis present

## 2023-03-11 DIAGNOSIS — I8290 Acute embolism and thrombosis of unspecified vein: Secondary | ICD-10-CM | POA: Diagnosis present

## 2023-03-11 DIAGNOSIS — C220 Liver cell carcinoma: Secondary | ICD-10-CM | POA: Diagnosis present

## 2023-03-11 DIAGNOSIS — K4090 Unilateral inguinal hernia, without obstruction or gangrene, not specified as recurrent: Secondary | ICD-10-CM

## 2023-03-11 DIAGNOSIS — E119 Type 2 diabetes mellitus without complications: Secondary | ICD-10-CM | POA: Diagnosis not present

## 2023-03-11 LAB — COMPREHENSIVE METABOLIC PANEL
ALT: 46 U/L — ABNORMAL HIGH (ref 0–44)
AST: 104 U/L — ABNORMAL HIGH (ref 15–41)
Albumin: 2.8 g/dL — ABNORMAL LOW (ref 3.5–5.0)
Alkaline Phosphatase: 224 U/L — ABNORMAL HIGH (ref 38–126)
Anion gap: 8 (ref 5–15)
BUN: 26 mg/dL — ABNORMAL HIGH (ref 8–23)
CO2: 22 mmol/L (ref 22–32)
Calcium: 9.1 mg/dL (ref 8.9–10.3)
Chloride: 104 mmol/L (ref 98–111)
Creatinine, Ser: 1.04 mg/dL (ref 0.61–1.24)
GFR, Estimated: >60 mL/min (ref >60)
Glucose, Bld: 185 mg/dL — ABNORMAL HIGH (ref 70–99)
Potassium: 3.6 mmol/L (ref 3.5–5.1)
Sodium: 134 mmol/L — ABNORMAL LOW (ref 135–145)
Total Bilirubin: 1.7 mg/dL — ABNORMAL HIGH (ref 0.3–1.2)
Total Protein: 8.2 g/dL — ABNORMAL HIGH (ref 6.5–8.1)

## 2023-03-11 LAB — URINALYSIS, W/ REFLEX TO CULTURE (INFECTION SUSPECTED)
Bacteria, UA: NONE SEEN
Bilirubin Urine: NEGATIVE
Glucose, UA: >=500 mg/dL — AB
Hgb urine dipstick: NEGATIVE
Ketones, ur: 5 mg/dL — AB
Leukocytes,Ua: NEGATIVE
Nitrite: NEGATIVE
Protein, ur: NEGATIVE mg/dL
Specific Gravity, Urine: 1.025 (ref 1.005–1.030)
pH: 5 (ref 5.0–8.0)

## 2023-03-11 LAB — CBC WITH DIFFERENTIAL/PLATELET
Abs Immature Granulocytes: 0.02 10*3/uL (ref 0.00–0.07)
Basophils Absolute: 0 10*3/uL (ref 0.0–0.1)
Basophils Relative: 0 %
Eosinophils Absolute: 0.1 10*3/uL (ref 0.0–0.5)
Eosinophils Relative: 1 %
HCT: 34.6 % — ABNORMAL LOW (ref 39.0–52.0)
Hemoglobin: 11.5 g/dL — ABNORMAL LOW (ref 13.0–17.0)
Immature Granulocytes: 0 %
Lymphocytes Relative: 14 %
Lymphs Abs: 0.7 10*3/uL (ref 0.7–4.0)
MCH: 31.3 pg (ref 26.0–34.0)
MCHC: 33.2 g/dL (ref 30.0–36.0)
MCV: 94 fL (ref 80.0–100.0)
Monocytes Absolute: 0.8 10*3/uL (ref 0.1–1.0)
Monocytes Relative: 16 %
Neutro Abs: 3.5 10*3/uL (ref 1.7–7.7)
Neutrophils Relative %: 69 %
Platelets: 87 10*3/uL — ABNORMAL LOW (ref 150–400)
RBC: 3.68 MIL/uL — ABNORMAL LOW (ref 4.22–5.81)
RDW: 13.5 % (ref 11.5–15.5)
WBC: 5.2 10*3/uL (ref 4.0–10.5)
nRBC: 0 % (ref 0.0–0.2)

## 2023-03-11 MED ORDER — HEPARIN (PORCINE) 25000 UT/250ML-% IV SOLN
1000.0000 [IU]/h | INTRAVENOUS | Status: DC
  Administered 2023-03-11: 1000 [IU]/h via INTRAVENOUS
  Filled 2023-03-11: qty 250

## 2023-03-11 MED ORDER — IOHEXOL 300 MG/ML  SOLN
100.0000 mL | Freq: Once | INTRAMUSCULAR | Status: AC | PRN
  Administered 2023-03-11: 100 mL via INTRAVENOUS

## 2023-03-11 MED ORDER — HEPARIN BOLUS VIA INFUSION
2000.0000 [IU] | Freq: Once | INTRAVENOUS | Status: AC
  Administered 2023-03-11: 2000 [IU] via INTRAVENOUS
  Filled 2023-03-11: qty 2000
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
## 2023-03-11 NOTE — ED Triage Notes (Signed)
Patient arrives ambulatory by POV c/o left sided lower abdominal pain extending into left groin x 1.5 week. Patient states pain started mild and now is a sore feeling. Denies any urinary symptoms.

## 2023-03-11 NOTE — ED Notes (Signed)
Patient had labs drawn at 11pm last night.
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
## 2023-03-11 NOTE — ED Provider Triage Note (Signed)
Emergency Medicine Provider Triage Evaluation Note  Troy [name] , a 67 y.o. male  was evaluated in triage.  Pt complains of LLQ pain.  Review of Systems  Positive:  Negative:  Physical Exam  BP 137/84 (BP Location: Right Arm)   Pulse 86   Temp 98.4 F (36.9 C) (Oral)   Resp 18   Ht 5\' 11"  (1.803 m)   Wt 65 kg   SpO2 99%   BMI 19.99 kg/m  Gen:   Awake, no distress   Resp:  Normal effort  MSK:   Moves extremities without difficulty  Other:    Medical Decision Making  Medically screening exam initiated at 12:40 PM.  Appropriate orders placed.  Troy [name] was informed that the remainder of the evaluation will be completed by another provider, this initial triage assessment does not replace that evaluation, and the importance of remaining in the ED until their evaluation is complete.  Hx of liver cancer. Hx of hernia surgery last year. Pain along left inguinal ligament site x1.5 weeks. Was mild at first and it is now 7/10 severity. Last BM today. Family member at bed side thinking that he strained a muscle while picking up a heavy container of water. Doctor wants a urine test.  Denies fever, chest pain, dyspnea, cough, nausea, vomiting, diarrhea, dysuria, hematuria, hematochezia.    Dorthy Cooler, [place] 03/11/23 1246
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
## 2023-03-11 NOTE — ED Notes (Signed)
No answer when called to room x3

## 2023-03-11 NOTE — ED Notes (Signed)
No answer when called to room

## 2023-03-12 DIAGNOSIS — I81 Portal vein thrombosis: Secondary | ICD-10-CM | POA: Diagnosis not present

## 2023-03-12 DIAGNOSIS — R1032 Left lower quadrant pain: Secondary | ICD-10-CM

## 2023-03-12 LAB — BASIC METABOLIC PANEL
Anion gap: 7 (ref 5–15)
BUN: 29 mg/dL — ABNORMAL HIGH (ref 8–23)
CO2: 23 mmol/L (ref 22–32)
Calcium: 8.9 mg/dL (ref 8.9–10.3)
Chloride: 104 mmol/L (ref 98–111)
Creatinine, Ser: 1.2 mg/dL (ref 0.61–1.24)
GFR, Estimated: >60 mL/min (ref >60)
Glucose, Bld: 226 mg/dL — ABNORMAL HIGH (ref 70–99)
Potassium: 3.8 mmol/L (ref 3.5–5.1)
Sodium: 134 mmol/L — ABNORMAL LOW (ref 135–145)

## 2023-03-12 LAB — CBG MONITORING, ED
Glucose-Capillary: 173 mg/dL — ABNORMAL HIGH (ref 70–99)
Glucose-Capillary: 233 mg/dL — ABNORMAL HIGH (ref 70–99)

## 2023-03-12 LAB — HIV ANTIBODY (ROUTINE TESTING W REFLEX): HIV Screen 4th Generation wRfx: NONREACTIVE

## 2023-03-12 MED ORDER — KETOROLAC TROMETHAMINE 15 MG/ML IJ SOLN
15.0000 mg | Freq: Once | INTRAMUSCULAR | Status: AC
  Administered 2023-03-12: 15 mg via INTRAVENOUS
  Filled 2023-03-12: qty 1

## 2023-03-12 MED ORDER — NAPROXEN 500 MG PO TABS
500.0000 mg | ORAL_TABLET | ORAL | Status: DC | PRN
  Administered 2023-03-12: 500 mg via ORAL
  Filled 2023-03-12: qty 1

## 2023-03-12 MED ORDER — INSULIN ASPART 100 UNIT/ML IJ SOLN
0.0000 [IU] | Freq: Three times a day (TID) | INTRAMUSCULAR | Status: DC
  Administered 2023-03-12: 2 [IU] via SUBCUTANEOUS
  Administered 2023-03-12: 1 [IU] via SUBCUTANEOUS
  Filled 2023-03-12: qty 0.06

## 2023-03-12 MED ORDER — HYDROCODONE-ACETAMINOPHEN 5-325 MG PO TABS: 1.0000 | ORAL_TABLET | Freq: Four times a day (QID) | ORAL | 0 refills | Status: DC | PRN

## 2023-03-12 MED ORDER — ACETAMINOPHEN 325 MG PO TABS: 650.0000 mg | ORAL_TABLET | Freq: Four times a day (QID) | ORAL | Status: DC | PRN

## 2023-03-12 MED ORDER — HEPARIN SOD (PORK) LOCK FLUSH 100 UNIT/ML IV SOLN
500.0000 [IU] | Freq: Once | INTRAVENOUS | Status: AC
  Administered 2023-03-12: 500 [IU]
  Filled 2023-03-12: qty 5

## 2023-03-12 NOTE — ED Notes (Signed)
Pt family member provided with a recliner.

## 2023-03-12 NOTE — Assessment & Plan Note (Signed)
-  Pt asked to be admitted for portal vein thrombus but upon further investigation this has been present since April. Primary oncologist Dr. [name] has held anticoagulation due to hx of GI bleed  -portal vein thrombus today remains stable similar to prior. However pt was already administered IV heparin bolus and started on infusion. Will discontinue and keep for observation of any acute bleeding and anemia. He has hx of GAVE, large esophageal varices and hx of GI bleed in May requiring 2 units transfusion, APC. Appreciate oncology consult in the morning to see if they would like to re-trial anticoagulation. -Follow CBC in the morning.

## 2023-03-12 NOTE — Assessment & Plan Note (Signed)
-  s/p palliative radiation, TACE procedure, immunotherapy currently on surveillance -Dr. [name] is primary

## 2023-03-12 NOTE — Assessment & Plan Note (Signed)
-  creatinine stable

## 2023-03-12 NOTE — Assessment & Plan Note (Signed)
-  suspect secondary to inguinal hernia -no hernia seen on CT and not appreciated on exam although exam limited with pt lying in bed -recommend follow up with outpatient general surgery with elective managment

## 2023-03-12 NOTE — Assessment & Plan Note (Signed)
-

## 2023-03-12 NOTE — Assessment & Plan Note (Signed)
-  Controlled. A1c of 4.8 in June
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
## 2023-03-26 ENCOUNTER — Encounter: Payer: Self-pay | Admitting: Hematology

## 2023-04-20 NOTE — Assessment & Plan Note (Signed)
cT3N1M0, stage IVA, G3 -history of cirrhosis since ~2011 and hepatitis C diagnosed and treated in 2018 -diagnosed by liver biopsy on 11/04/21, baseline AFP normal  -s/p palliative radiation under Dr. Mitzi Hansen, 7/20-12/17/21 -he began Tecentriq and bevacizumab on 12/06/21. Tolerating well overall. 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 Milford Cage. -Restaging CT scan from April 21, 2022 showed excellent partial response to treatment, reviewed with patient and his wife today. -treatment held in Dec 2023 due to hospital admission for N/V and poor oral intake. Restarted on 05/14/2022 -Restaging CT abdomen pelvis from August 25, 2022 showed mixed response to treatment. Restaging CT on 11/13/2021 showed further disease progression -I changed his treatment to [facility], unfortunately he tolerated poorly and ended up in the hospital with hepatic encephalopathy after first week of Lenvima, his liver function also got worse.  He did recover well after he came off Lenvima -He is on surveillance now. -restaging CT scan from January 27, 2023, which showed stable liver lesions but progression of abdominal lymphadenopathy
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
## 2023-04-21 ENCOUNTER — Other Ambulatory Visit: Payer: Self-pay

## 2023-04-21 ENCOUNTER — Encounter: Payer: Self-pay | Admitting: Hematology

## 2023-04-21 ENCOUNTER — Inpatient Hospital Stay (HOSPITAL_BASED_OUTPATIENT_CLINIC_OR_DEPARTMENT_OTHER): Payer: No Typology Code available for payment source | Admitting: Nurse Practitioner

## 2023-04-21 ENCOUNTER — Emergency Department (HOSPITAL_COMMUNITY): Payer: No Typology Code available for payment source

## 2023-04-21 ENCOUNTER — Inpatient Hospital Stay (HOSPITAL_COMMUNITY)
Admission: EM | Admit: 2023-04-21 | Discharge: 2023-04-24 | DRG: 871 | Disposition: A | Discharge status: Home / Self Care | Payer: No Typology Code available for payment source | Attending: Student | Admitting: Student

## 2023-04-21 ENCOUNTER — Encounter (HOSPITAL_COMMUNITY): Payer: Self-pay

## 2023-04-21 ENCOUNTER — Inpatient Hospital Stay: Payer: No Typology Code available for payment source

## 2023-04-21 ENCOUNTER — Inpatient Hospital Stay: Payer: No Typology Code available for payment source | Attending: Hematology | Admitting: Hematology

## 2023-04-21 ENCOUNTER — Encounter: Payer: Self-pay | Admitting: Nurse Practitioner

## 2023-04-21 ENCOUNTER — Other Ambulatory Visit: Payer: Self-pay | Admitting: *Deleted

## 2023-04-21 VITALS — BP 144/86 | HR 80 | Temp 97.5°F | Resp 16 | Wt 144.6 lb

## 2023-04-21 DIAGNOSIS — K766 Portal hypertension: Secondary | ICD-10-CM | POA: Diagnosis not present

## 2023-04-21 DIAGNOSIS — G8929 Other chronic pain: Secondary | ICD-10-CM | POA: Diagnosis present

## 2023-04-21 DIAGNOSIS — C7989 Secondary malignant neoplasm of other specified sites: Secondary | ICD-10-CM | POA: Diagnosis present

## 2023-04-21 DIAGNOSIS — I10 Essential (primary) hypertension: Secondary | ICD-10-CM | POA: Diagnosis present

## 2023-04-21 DIAGNOSIS — K2100 Gastro-esophageal reflux disease with esophagitis, without bleeding: Secondary | ICD-10-CM | POA: Insufficient documentation

## 2023-04-21 DIAGNOSIS — Z23 Encounter for immunization: Secondary | ICD-10-CM

## 2023-04-21 DIAGNOSIS — A4150 Gram-negative sepsis, unspecified: Principal | ICD-10-CM | POA: Diagnosis present

## 2023-04-21 DIAGNOSIS — K7682 Hepatic encephalopathy: Secondary | ICD-10-CM | POA: Diagnosis not present

## 2023-04-21 DIAGNOSIS — I864 Gastric varices: Secondary | ICD-10-CM | POA: Diagnosis not present

## 2023-04-21 DIAGNOSIS — I700 Atherosclerosis of aorta: Secondary | ICD-10-CM | POA: Insufficient documentation

## 2023-04-21 DIAGNOSIS — K31819 Angiodysplasia of stomach and duodenum without bleeding: Secondary | ICD-10-CM | POA: Diagnosis present

## 2023-04-21 DIAGNOSIS — D649 Anemia, unspecified: Secondary | ICD-10-CM

## 2023-04-21 DIAGNOSIS — T80212D Local infection due to central venous catheter, subsequent encounter: Secondary | ICD-10-CM | POA: Diagnosis not present

## 2023-04-21 DIAGNOSIS — B961 Klebsiella pneumoniae [K. pneumoniae] as the cause of diseases classified elsewhere: Secondary | ICD-10-CM | POA: Diagnosis present

## 2023-04-21 DIAGNOSIS — Z79899 Other long term (current) drug therapy: Secondary | ICD-10-CM | POA: Insufficient documentation

## 2023-04-21 DIAGNOSIS — R0789 Other chest pain: Secondary | ICD-10-CM | POA: Insufficient documentation

## 2023-04-21 DIAGNOSIS — B1920 Unspecified viral hepatitis C without hepatic coma: Secondary | ICD-10-CM | POA: Diagnosis not present

## 2023-04-21 DIAGNOSIS — E871 Hypo-osmolality and hyponatremia: Secondary | ICD-10-CM | POA: Diagnosis present

## 2023-04-21 DIAGNOSIS — Z7189 Other specified counseling: Secondary | ICD-10-CM | POA: Diagnosis not present

## 2023-04-21 DIAGNOSIS — C220 Liver cell carcinoma: Secondary | ICD-10-CM

## 2023-04-21 DIAGNOSIS — D696 Thrombocytopenia, unspecified: Secondary | ICD-10-CM | POA: Diagnosis present

## 2023-04-21 DIAGNOSIS — N433 Hydrocele, unspecified: Secondary | ICD-10-CM | POA: Diagnosis present

## 2023-04-21 DIAGNOSIS — Z9221 Personal history of antineoplastic chemotherapy: Secondary | ICD-10-CM

## 2023-04-21 DIAGNOSIS — E872 Acidosis, unspecified: Secondary | ICD-10-CM | POA: Diagnosis present

## 2023-04-21 DIAGNOSIS — R079 Chest pain, unspecified: Secondary | ICD-10-CM | POA: Diagnosis not present

## 2023-04-21 DIAGNOSIS — R609 Edema, unspecified: Secondary | ICD-10-CM | POA: Insufficient documentation

## 2023-04-21 DIAGNOSIS — D630 Anemia in neoplastic disease: Secondary | ICD-10-CM | POA: Diagnosis present

## 2023-04-21 DIAGNOSIS — I8510 Secondary esophageal varices without bleeding: Secondary | ICD-10-CM | POA: Diagnosis not present

## 2023-04-21 DIAGNOSIS — E1165 Type 2 diabetes mellitus with hyperglycemia: Secondary | ICD-10-CM | POA: Diagnosis present

## 2023-04-21 DIAGNOSIS — Z7984 Long term (current) use of oral hypoglycemic drugs: Secondary | ICD-10-CM

## 2023-04-21 DIAGNOSIS — I868 Varicose veins of other specified sites: Secondary | ICD-10-CM | POA: Insufficient documentation

## 2023-04-21 DIAGNOSIS — K449 Diaphragmatic hernia without obstruction or gangrene: Secondary | ICD-10-CM | POA: Insufficient documentation

## 2023-04-21 DIAGNOSIS — E860 Dehydration: Secondary | ICD-10-CM

## 2023-04-21 DIAGNOSIS — Z87891 Personal history of nicotine dependence: Secondary | ICD-10-CM | POA: Insufficient documentation

## 2023-04-21 DIAGNOSIS — Z794 Long term (current) use of insulin: Secondary | ICD-10-CM

## 2023-04-21 DIAGNOSIS — K4090 Unilateral inguinal hernia, without obstruction or gangrene, not specified as recurrent: Secondary | ICD-10-CM | POA: Diagnosis present

## 2023-04-21 DIAGNOSIS — Z800 Family history of malignant neoplasm of digestive organs: Secondary | ICD-10-CM

## 2023-04-21 DIAGNOSIS — N400 Enlarged prostate without lower urinary tract symptoms: Secondary | ICD-10-CM | POA: Diagnosis not present

## 2023-04-21 DIAGNOSIS — K3189 Other diseases of stomach and duodenum: Secondary | ICD-10-CM | POA: Diagnosis present

## 2023-04-21 DIAGNOSIS — R6510 Systemic inflammatory response syndrome (SIRS) of non-infectious origin without acute organ dysfunction: Secondary | ICD-10-CM | POA: Diagnosis present

## 2023-04-21 DIAGNOSIS — E7800 Pure hypercholesterolemia, unspecified: Secondary | ICD-10-CM | POA: Diagnosis present

## 2023-04-21 DIAGNOSIS — R1084 Generalized abdominal pain: Secondary | ICD-10-CM | POA: Diagnosis not present

## 2023-04-21 DIAGNOSIS — R6520 Severe sepsis without septic shock: Secondary | ICD-10-CM | POA: Diagnosis present

## 2023-04-21 DIAGNOSIS — T80212A Local infection due to central venous catheter, initial encounter: Secondary | ICD-10-CM | POA: Diagnosis not present

## 2023-04-21 DIAGNOSIS — R6889 Other general symptoms and signs: Principal | ICD-10-CM

## 2023-04-21 DIAGNOSIS — Z8619 Personal history of other infectious and parasitic diseases: Secondary | ICD-10-CM | POA: Insufficient documentation

## 2023-04-21 DIAGNOSIS — Z8249 Family history of ischemic heart disease and other diseases of the circulatory system: Secondary | ICD-10-CM | POA: Diagnosis not present

## 2023-04-21 DIAGNOSIS — I81 Portal vein thrombosis: Secondary | ICD-10-CM | POA: Diagnosis present

## 2023-04-21 DIAGNOSIS — E119 Type 2 diabetes mellitus without complications: Secondary | ICD-10-CM | POA: Diagnosis not present

## 2023-04-21 DIAGNOSIS — D72810 Lymphocytopenia: Secondary | ICD-10-CM | POA: Diagnosis present

## 2023-04-21 DIAGNOSIS — G893 Neoplasm related pain (acute) (chronic): Secondary | ICD-10-CM | POA: Diagnosis not present

## 2023-04-21 DIAGNOSIS — Z515 Encounter for palliative care: Secondary | ICD-10-CM

## 2023-04-21 DIAGNOSIS — R7881 Bacteremia: Secondary | ICD-10-CM | POA: Diagnosis not present

## 2023-04-21 DIAGNOSIS — K828 Other specified diseases of gallbladder: Secondary | ICD-10-CM | POA: Insufficient documentation

## 2023-04-21 DIAGNOSIS — B182 Chronic viral hepatitis C: Secondary | ICD-10-CM | POA: Diagnosis not present

## 2023-04-21 DIAGNOSIS — R590 Localized enlarged lymph nodes: Secondary | ICD-10-CM | POA: Diagnosis not present

## 2023-04-21 DIAGNOSIS — A419 Sepsis, unspecified organism: Secondary | ICD-10-CM | POA: Diagnosis not present

## 2023-04-21 DIAGNOSIS — Z1152 Encounter for screening for COVID-19: Secondary | ICD-10-CM

## 2023-04-21 DIAGNOSIS — R197 Diarrhea, unspecified: Secondary | ICD-10-CM | POA: Diagnosis present

## 2023-04-21 DIAGNOSIS — K7460 Unspecified cirrhosis of liver: Secondary | ICD-10-CM | POA: Diagnosis not present

## 2023-04-21 DIAGNOSIS — Z85038 Personal history of other malignant neoplasm of large intestine: Secondary | ICD-10-CM

## 2023-04-21 LAB — COMPREHENSIVE METABOLIC PANEL
ALT: 34 U/L (ref 0–44)
AST: 97 U/L — ABNORMAL HIGH (ref 15–41)
Albumin: 2.6 g/dL — ABNORMAL LOW (ref 3.5–5.0)
Alkaline Phosphatase: 281 U/L — ABNORMAL HIGH (ref 38–126)
Anion gap: 13 (ref 5–15)
BUN: 19 mg/dL (ref 8–23)
CO2: 20 mmol/L — ABNORMAL LOW (ref 22–32)
Calcium: 9.5 mg/dL (ref 8.9–10.3)
Chloride: 104 mmol/L (ref 98–111)
Creatinine, Ser: 1.22 mg/dL (ref 0.61–1.24)
GFR, Estimated: >60 mL/min (ref >60)
Glucose, Bld: 186 mg/dL — ABNORMAL HIGH (ref 70–99)
Potassium: 3.9 mmol/L (ref 3.5–5.1)
Sodium: 137 mmol/L (ref 135–145)
Total Bilirubin: 1.3 mg/dL — ABNORMAL HIGH (ref <1.2)
Total Protein: 8.4 g/dL — ABNORMAL HIGH (ref 6.5–8.1)

## 2023-04-21 LAB — URINALYSIS, W/ REFLEX TO CULTURE (INFECTION SUSPECTED)
Bacteria, UA: NONE SEEN
Bilirubin Urine: NEGATIVE
Glucose, UA: NEGATIVE mg/dL
Ketones, ur: NEGATIVE mg/dL
Leukocytes,Ua: NEGATIVE
Nitrite: NEGATIVE
Protein, ur: NEGATIVE mg/dL
Specific Gravity, Urine: 1.011 (ref 1.005–1.030)
pH: 5 (ref 5.0–8.0)

## 2023-04-21 LAB — CBC WITH DIFFERENTIAL/PLATELET
Abs Immature Granulocytes: 0.01 10*3/uL (ref 0.00–0.07)
Basophils Absolute: 0 10*3/uL (ref 0.0–0.1)
Basophils Relative: 0 %
Eosinophils Absolute: 0 10*3/uL (ref 0.0–0.5)
Eosinophils Relative: 0 %
HCT: 33.5 % — ABNORMAL LOW (ref 39.0–52.0)
Hemoglobin: 11.1 g/dL — ABNORMAL LOW (ref 13.0–17.0)
Immature Granulocytes: 0 %
Lymphocytes Relative: 13 %
Lymphs Abs: 0.3 10*3/uL — ABNORMAL LOW (ref 0.7–4.0)
MCH: 31.4 pg (ref 26.0–34.0)
MCHC: 33.1 g/dL (ref 30.0–36.0)
MCV: 94.6 fL (ref 80.0–100.0)
Monocytes Absolute: 0 10*3/uL — ABNORMAL LOW (ref 0.1–1.0)
Monocytes Relative: 1 %
Neutro Abs: 2.2 10*3/uL (ref 1.7–7.7)
Neutrophils Relative %: 86 %
Platelets: 104 10*3/uL — ABNORMAL LOW (ref 150–400)
RBC: 3.54 MIL/uL — ABNORMAL LOW (ref 4.22–5.81)
RDW: 13.7 % (ref 11.5–15.5)
WBC: 2.5 10*3/uL — ABNORMAL LOW (ref 4.0–10.5)
nRBC: 0 % (ref 0.0–0.2)

## 2023-04-21 LAB — PROTIME-INR
INR: 1.2 (ref 0.8–1.2)
Prothrombin Time: 15.2 s (ref 11.4–15.2)

## 2023-04-21 LAB — CBC WITH DIFFERENTIAL (CANCER CENTER ONLY)
Abs Immature Granulocytes: 0.02 10*3/uL (ref 0.00–0.07)
Basophils Absolute: 0 10*3/uL (ref 0.0–0.1)
Basophils Relative: 0 %
Eosinophils Absolute: 0 10*3/uL (ref 0.0–0.5)
Eosinophils Relative: 1 %
HCT: 31.2 % — ABNORMAL LOW (ref 39.0–52.0)
Hemoglobin: 10.4 g/dL — ABNORMAL LOW (ref 13.0–17.0)
Immature Granulocytes: 0 %
Lymphocytes Relative: 10 %
Lymphs Abs: 0.6 10*3/uL — ABNORMAL LOW (ref 0.7–4.0)
MCH: 30.9 pg (ref 26.0–34.0)
MCHC: 33.3 g/dL (ref 30.0–36.0)
MCV: 92.6 fL (ref 80.0–100.0)
Monocytes Absolute: 0.5 10*3/uL (ref 0.1–1.0)
Monocytes Relative: 9 %
Neutro Abs: 5 10*3/uL (ref 1.7–7.7)
Neutrophils Relative %: 80 %
Platelet Count: 114 10*3/uL — ABNORMAL LOW (ref 150–400)
RBC: 3.37 MIL/uL — ABNORMAL LOW (ref 4.22–5.81)
RDW: 13.7 % (ref 11.5–15.5)
WBC Count: 6.3 10*3/uL (ref 4.0–10.5)
nRBC: 0 % (ref 0.0–0.2)

## 2023-04-21 LAB — RESP PANEL BY RT-PCR (RSV, FLU A&B, COVID)  RVPGX2
Influenza A by PCR: NEGATIVE
Influenza B by PCR: NEGATIVE
Resp Syncytial Virus by PCR: NEGATIVE
SARS Coronavirus 2 by RT PCR: NEGATIVE

## 2023-04-21 LAB — I-STAT CG4 LACTIC ACID, ED
Lactic Acid, Venous: 6.3 mmol/L (ref 0.5–1.9)
Lactic Acid, Venous: 2.6 mmol/L (ref 0.5–1.9)

## 2023-04-21 LAB — CBG MONITORING, ED: Glucose-Capillary: 179 mg/dL — ABNORMAL HIGH (ref 70–99)

## 2023-04-21 LAB — TROPONIN I (HIGH SENSITIVITY)
Troponin I (High Sensitivity): 8 ng/L (ref <18)
Troponin I (High Sensitivity): 19 ng/L — ABNORMAL HIGH (ref <18)

## 2023-04-21 LAB — LIPASE, BLOOD: Lipase: 27 U/L (ref 11–51)

## 2023-04-21 LAB — HEMOGLOBIN A1C
Hgb A1c MFr Bld: 8.4 % — ABNORMAL HIGH (ref 4.8–5.6)
Mean Plasma Glucose: 194.38 mg/dL

## 2023-04-21 LAB — FERRITIN: Ferritin: 118 ng/mL (ref 24–336)

## 2023-04-21 MED ORDER — SODIUM CHLORIDE 0.9% FLUSH
10.0000 mL | Freq: Once | INTRAVENOUS | Status: AC
  Administered 2023-04-21: 10 mL

## 2023-04-21 MED ORDER — PANTOPRAZOLE SODIUM 40 MG PO TBEC
40.0000 mg | DELAYED_RELEASE_TABLET | Freq: Two times a day (BID) | ORAL | Status: DC
  Administered 2023-04-22 – 2023-04-24 (×5): 40 mg via ORAL
  Filled 2023-04-21 (×5): qty 1

## 2023-04-21 MED ORDER — METRONIDAZOLE 500 MG/100ML IV SOLN
500.0000 mg | Freq: Two times a day (BID) | INTRAVENOUS | Status: DC
  Administered 2023-04-22: 500 mg via INTRAVENOUS
  Filled 2023-04-21: qty 100

## 2023-04-21 MED ORDER — SODIUM CHLORIDE 0.9 % IV BOLUS
1000.0000 mL | Freq: Once | INTRAVENOUS | Status: AC
  Administered 2023-04-21: 1000 mL via INTRAVENOUS

## 2023-04-21 MED ORDER — ACETAMINOPHEN 325 MG PO TABS
650.0000 mg | ORAL_TABLET | Freq: Four times a day (QID) | ORAL | Status: DC | PRN
  Administered 2023-04-21 – 2023-04-23 (×2): 650 mg via ORAL
  Filled 2023-04-21 (×2): qty 2

## 2023-04-21 MED ORDER — ACETAMINOPHEN 650 MG RE SUPP: 650.0000 mg | Freq: Four times a day (QID) | RECTAL | Status: DC | PRN

## 2023-04-21 MED ORDER — IOHEXOL 350 MG/ML SOLN
100.0000 mL | Freq: Once | INTRAVENOUS | Status: AC | PRN
  Administered 2023-04-21: 100 mL via INTRAVENOUS

## 2023-04-21 MED ORDER — SODIUM CHLORIDE 0.9 % IV SOLN
2.0000 g | Freq: Two times a day (BID) | INTRAVENOUS | Status: DC
  Administered 2023-04-22 – 2023-04-24 (×5): 2 g via INTRAVENOUS
  Filled 2023-04-21 (×5): qty 12.5

## 2023-04-21 MED ORDER — INSULIN ASPART 100 UNIT/ML IJ SOLN
0.0000 [IU] | Freq: Three times a day (TID) | INTRAMUSCULAR | Status: DC
  Administered 2023-04-22: 1 [IU] via SUBCUTANEOUS
  Administered 2023-04-22 – 2023-04-23 (×3): 2 [IU] via SUBCUTANEOUS
  Administered 2023-04-23 – 2023-04-24 (×2): 1 [IU] via SUBCUTANEOUS
  Filled 2023-04-21: qty 0.09

## 2023-04-21 MED ORDER — LABETALOL HCL 5 MG/ML IV SOLN: 10.0000 mg | INTRAVENOUS | Status: DC | PRN

## 2023-04-21 MED ORDER — SODIUM CHLORIDE 0.9 % IV SOLN: INTRAVENOUS | Status: AC

## 2023-04-21 MED ORDER — INSULIN ASPART 100 UNIT/ML IJ SOLN
0.0000 [IU] | Freq: Every day | INTRAMUSCULAR | Status: DC
  Administered 2023-04-22: 2 [IU] via SUBCUTANEOUS
  Filled 2023-04-21: qty 0.05

## 2023-04-21 MED ORDER — VANCOMYCIN HCL 1250 MG/250ML IV SOLN: 1250.0000 mg | INTRAVENOUS | Status: DC

## 2023-04-21 MED ORDER — AMLODIPINE BESYLATE 5 MG PO TABS: 10.0000 mg | ORAL_TABLET | Freq: Every day | ORAL | Status: DC | PRN

## 2023-04-21 MED ORDER — PNEUMOCOCCAL 20-VAL CONJ VACC 0.5 ML IM SUSY
0.5000 mL | PREFILLED_SYRINGE | INTRAMUSCULAR | Status: AC
  Administered 2023-04-22: 0.5 mL via INTRAMUSCULAR
  Filled 2023-04-21: qty 0.5

## 2023-04-21 MED ORDER — ONDANSETRON HCL 4 MG/2ML IJ SOLN
4.0000 mg | Freq: Once | INTRAMUSCULAR | Status: AC
  Administered 2023-04-21: 4 mg via INTRAVENOUS
  Filled 2023-04-21: qty 2

## 2023-04-21 MED ORDER — SODIUM CHLORIDE 0.9 % IV SOLN
2.0000 g | Freq: Once | INTRAVENOUS | Status: AC
  Administered 2023-04-21: 2 g via INTRAVENOUS
  Filled 2023-04-21: qty 12.5

## 2023-04-21 MED ORDER — ONDANSETRON HCL 4 MG PO TABS: 4.0000 mg | ORAL_TABLET | Freq: Four times a day (QID) | ORAL | Status: DC | PRN

## 2023-04-21 MED ORDER — OXYCODONE HCL 5 MG PO TABS
5.0000 mg | ORAL_TABLET | ORAL | Status: DC | PRN
  Administered 2023-04-23: 5 mg via ORAL
  Filled 2023-04-21: qty 1

## 2023-04-21 MED ORDER — ENOXAPARIN SODIUM 40 MG/0.4ML IJ SOSY
40.0000 mg | PREFILLED_SYRINGE | INTRAMUSCULAR | Status: DC
  Administered 2023-04-21 – 2023-04-23 (×3): 40 mg via SUBCUTANEOUS
  Filled 2023-04-21 (×3): qty 0.4

## 2023-04-21 MED ORDER — METRONIDAZOLE 500 MG/100ML IV SOLN
500.0000 mg | Freq: Once | INTRAVENOUS | Status: AC
  Administered 2023-04-21: 500 mg via INTRAVENOUS
  Filled 2023-04-21: qty 100

## 2023-04-21 MED ORDER — MORPHINE SULFATE (PF) 4 MG/ML IV SOLN
4.0000 mg | Freq: Once | INTRAVENOUS | Status: AC
  Administered 2023-04-21: 4 mg via INTRAVENOUS
  Filled 2023-04-21: qty 1

## 2023-04-21 MED ORDER — INFLUENZA VAC A&B SURF ANT ADJ 0.5 ML IM SUSY
0.5000 mL | PREFILLED_SYRINGE | INTRAMUSCULAR | Status: AC
  Administered 2023-04-22: 0.5 mL via INTRAMUSCULAR
  Filled 2023-04-21: qty 0.5

## 2023-04-21 MED ORDER — ONDANSETRON HCL 4 MG/2ML IJ SOLN: 4.0000 mg | Freq: Four times a day (QID) | INTRAMUSCULAR | Status: DC | PRN

## 2023-04-21 MED ORDER — VANCOMYCIN HCL IN DEXTROSE 1-5 GM/200ML-% IV SOLN: 1000.0000 mg | Freq: Once | INTRAVENOUS | Status: DC

## 2023-04-21 MED ORDER — HEPARIN SOD (PORK) LOCK FLUSH 100 UNIT/ML IV SOLN
500.0000 [IU] | Freq: Once | INTRAVENOUS | Status: AC
  Administered 2023-04-21: 500 [IU]

## 2023-04-21 MED ORDER — VANCOMYCIN HCL 1.5 G IV SOLR
1500.0000 mg | Freq: Once | INTRAVENOUS | Status: AC
  Administered 2023-04-21: 1500 mg via INTRAVENOUS
  Filled 2023-04-21: qty 30

## 2023-04-21 NOTE — ED Notes (Signed)
Patient transported to CT 

## 2023-04-21 NOTE — Progress Notes (Signed)
ED Pharmacy Antibiotic Sign Off An antibiotic consult was received from an ED provider for Vancomycin and Cefepime per pharmacy dosing for sepsis (unknown source). A chart review was completed to assess appropriateness.   The following one time order(s) were placed:  Vancomycin 1500mg  IV and Cefepime 2g IV  Further antibiotic and/or antibiotic pharmacy consults should be ordered by the admitting provider if indicated.   Thank you for allowing pharmacy to be a part of this patient's care.   Jamse Mead, [facility]  Clinical Pharmacist 04/21/23 4:19 PM
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
## 2023-04-21 NOTE — Progress Notes (Signed)
Pharmacy Antibiotic Note  Troy [name] is a 67 y.o. male admitted on 04/21/2023 with SIRS, unknown source. Pharmacy has been consulted for Vancomycin and Cefepime dosing.  Plan: Vancomycin 1500mg  IV x 1 given in the ED. Continue with Vancomycin 1250mg  IV q24h. Vancomycin levels at steady state, as indicated. Cefepime 2g IV q12h. Metronidazole 500mg  IV q12h per MD. Monitor renal function, cultures, clinical course.   Height: 5\' 11"  (180.3 cm) Weight: 65.6 kg (144 lb 10 oz) IBW/kg (Calculated) : 75.3  Temp (24hrs), Avg:98 F (36.7 C), Min:97.5 F (36.4 C), Max:98.4 F (36.9 C)  Recent Labs  Lab 04/21/23 1359 04/21/23 1600 04/21/23 1652 04/21/23 1822  WBC 6.3 2.5*  --   --   CREATININE  --  1.22  --   --   LATICACIDVEN  --   --  6.3* 2.6*    Estimated Creatinine Clearance: 53 mL/min (by C-G formula based on SCr of 1.22 mg/dL).    No Known Allergies  Antimicrobials this admission: 12/10 Vancomycin >> 12/10 Cefepime >> 12/10 Metronidazole >>  Dose adjustments this admission: --  Microbiology results: 12/10 BCx:  12/10 UCx:   12/10 Resp panel: negative 12/10 C.diff PCR:    Thank you for allowing pharmacy to be a part of this patient's care.   Greer Pickerel, PharmD, BCPS Clinical Pharmacist 04/21/2023 8:34 PM
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
## 2023-04-21 NOTE — ED Triage Notes (Signed)
Patient brought over from cancer center due to chest pain, abdominal pain with nausea, vomiting and diarrhea. Pt reports back pain as well. Pt feels warm to touch, but reports he is cold and is shivering. Pt went to the bathroom and was unable to get up due to weakness, so they came to the ER.
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
## 2023-04-21 NOTE — Progress Notes (Unsigned)
Theis RN was notified by NT that pt was too weak in the bathroom to stand on his own and nearly fell. RN notified NP and both went to check pt in lobby. BP 127/104, temp 98.7, resp 23, o2 96% RA, HR was 127. Pt reported dry heaves, feeling dizzy, and rigors. RN escorted pt to ED rm12, report given to RN, family at bedside, all questions answered.

## 2023-04-22 ENCOUNTER — Encounter: Payer: Self-pay | Admitting: Hematology

## 2023-04-22 DIAGNOSIS — A4150 Gram-negative sepsis, unspecified: Secondary | ICD-10-CM | POA: Diagnosis not present

## 2023-04-22 DIAGNOSIS — E119 Type 2 diabetes mellitus without complications: Secondary | ICD-10-CM | POA: Diagnosis not present

## 2023-04-22 DIAGNOSIS — D696 Thrombocytopenia, unspecified: Secondary | ICD-10-CM | POA: Diagnosis not present

## 2023-04-22 DIAGNOSIS — A419 Sepsis, unspecified organism: Secondary | ICD-10-CM | POA: Diagnosis not present

## 2023-04-22 DIAGNOSIS — C220 Liver cell carcinoma: Secondary | ICD-10-CM | POA: Diagnosis not present

## 2023-04-22 LAB — COMPREHENSIVE METABOLIC PANEL
ALT: 28 U/L (ref 0–44)
AST: 80 U/L — ABNORMAL HIGH (ref 15–41)
Albumin: 2 g/dL — ABNORMAL LOW (ref 3.5–5.0)
Alkaline Phosphatase: 190 U/L — ABNORMAL HIGH (ref 38–126)
Anion gap: 7 (ref 5–15)
BUN: 19 mg/dL (ref 8–23)
CO2: 18 mmol/L — ABNORMAL LOW (ref 22–32)
Calcium: 7.9 mg/dL — ABNORMAL LOW (ref 8.9–10.3)
Chloride: 106 mmol/L (ref 98–111)
Creatinine, Ser: 1.24 mg/dL (ref 0.61–1.24)
GFR, Estimated: >60 mL/min (ref >60)
Glucose, Bld: 281 mg/dL — ABNORMAL HIGH (ref 70–99)
Potassium: 4.3 mmol/L (ref 3.5–5.1)
Sodium: 131 mmol/L — ABNORMAL LOW (ref 135–145)
Total Bilirubin: 0.8 mg/dL (ref <1.2)
Total Protein: 6.4 g/dL — ABNORMAL LOW (ref 6.5–8.1)

## 2023-04-22 LAB — BLOOD CULTURE ID PANEL (REFLEXED) - BCID2

## 2023-04-22 LAB — CBC
HCT: 27.9 % — ABNORMAL LOW (ref 39.0–52.0)
Hemoglobin: 9.1 g/dL — ABNORMAL LOW (ref 13.0–17.0)
MCH: 31.7 pg (ref 26.0–34.0)
MCHC: 32.6 g/dL (ref 30.0–36.0)
MCV: 97.2 fL (ref 80.0–100.0)
Platelets: 87 10*3/uL — ABNORMAL LOW (ref 150–400)
RBC: 2.87 MIL/uL — ABNORMAL LOW (ref 4.22–5.81)
RDW: 14.1 % (ref 11.5–15.5)
WBC: 13.5 10*3/uL — ABNORMAL HIGH (ref 4.0–10.5)
nRBC: 0 % (ref 0.0–0.2)

## 2023-04-22 LAB — CG4 I-STAT (LACTIC ACID)
Lactic Acid, Venous: 6 mmol/L (ref 0.5–1.9)
Lactic Acid, Venous: 6.3 mmol/L (ref 0.5–1.9)

## 2023-04-22 LAB — GLUCOSE, CAPILLARY
Glucose-Capillary: 192 mg/dL — ABNORMAL HIGH (ref 70–99)
Glucose-Capillary: 148 mg/dL — ABNORMAL HIGH (ref 70–99)
Glucose-Capillary: 157 mg/dL — ABNORMAL HIGH (ref 70–99)
Glucose-Capillary: 204 mg/dL — ABNORMAL HIGH (ref 70–99)

## 2023-04-22 LAB — LACTIC ACID, PLASMA: Lactic Acid, Venous: 2.4 mmol/L (ref 0.5–1.9)

## 2023-04-22 MED ORDER — SODIUM BICARBONATE 650 MG PO TABS
650.0000 mg | ORAL_TABLET | Freq: Two times a day (BID) | ORAL | Status: DC
  Administered 2023-04-22 – 2023-04-24 (×5): 650 mg via ORAL
  Filled 2023-04-22 (×5): qty 1

## 2023-04-22 MED ORDER — SODIUM CHLORIDE 0.9 % IV BOLUS
500.0000 mL | Freq: Once | INTRAVENOUS | Status: AC
  Administered 2023-04-22: 500 mL via INTRAVENOUS

## 2023-04-22 NOTE — Progress Notes (Signed)
OT Cancellation Note  Patient Details Name: Troy [name] MRN: [id number] DOB: 03/09/1956   Cancelled Treatment:    Reason Eval/Treat Not Completed: OT screened, no needs identified, will sign off.  Reuben Likes, OTR/L 04/22/2023, 1:57 PM

## 2023-04-22 NOTE — TOC Initial Note (Addendum)
Transition of Care [facility]) - Initial/Assessment Note    Patient Details  Name: Troy [name] MRN: [id number] Date of Birth: [birth year]  [facility] CM/SW Contact:    Troy Clam, RN Phone Number: 04/22/2023, 11:14 AM  Clinical Narrative: d/c plan home. Active w/[facility]-aide service through [place] benefit. Has own transport home. -update from [facility]-patient not active w/their services.Per spouse has aide service through [place] benefit.                  Expected Discharge Plan: Home/Self Care Barriers to Discharge: Continued Medical Work up   Patient Goals and CMS Choice Patient states their goals for this hospitalization and ongoing recovery are:: Home CMS Medicare.gov Compare Post Acute Care list provided to:: Patient Represenative (must comment) ([name](spouse))   [place] ownership interest in [facility]provided to:: Spouse    Expected Discharge Plan and Services   Discharge Planning Services: CM Consult Post Acute Care Choice: Resumption of Svcs/PTA Provider Living arrangements for the past 2 months: Single Family Home                                      Prior Living Arrangements/Services Living arrangements for the past 2 months: Single Family Home Lives with:: Spouse Patient language and need for interpreter reviewed:: Yes Do you feel safe going back to the place where you live?: Yes      Need for Family Participation in Patient Care: Yes (Comment) Care giver support system in place?: Yes (comment) Current home services: DME (walking stick;[facility]-aide) Criminal Activity/Legal Involvement Pertinent to Current Situation/Hospitalization: No - Comment as needed  Activities of Daily Living   ADL Screening (condition at time of admission) Independently performs ADLs?: Yes (appropriate for developmental age) Is the patient deaf or have difficulty hearing?: No Does the patient have difficulty seeing, even when wearing  glasses/contacts?: Yes Does the patient have difficulty concentrating, remembering, or making decisions?: Yes  Permission Sought/Granted Permission sought to share information with : Case Manager Permission granted to share information with : Yes, Verbal Permission Granted              Emotional Assessment Appearance:: Appears stated age Attitude/Demeanor/Rapport: Gracious Affect (typically observed): Accepting Orientation: : Oriented to Self, Oriented to Place, Oriented to  Time, Oriented to Situation Alcohol / Substance Use: Not Applicable Psych Involvement: No (comment)  Admission diagnosis:  Lactic acidosis [E87.20] Rigors [R68.89] Generalized abdominal pain [R10.84] SIRS (systemic inflammatory response syndrome) (HCC) [R65.10] Chest pain, unspecified type [R07.9] Patient Active Problem List   Diagnosis Date Noted   SIRS (systemic inflammatory response syndrome) (HCC) 04/21/2023   Lt inguinal pain 03/12/2023   Thrombus 03/11/2023   Hepatic encephalopathy (HCC) 11/22/2022   Acute hepatic encephalopathy (HCC) 11/21/2022   Hematuria 10/17/2022   Protein-calorie malnutrition, severe 10/17/2022   Acute GI bleeding 10/16/2022   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   Melena 09/10/2022   ABLA (acute blood loss anemia) 09/10/2022   Dehydration 04/22/2022   Tachycardia 04/22/2022   Hypercalcemia 04/22/2022   Thrombocytopenia (HCC) 04/22/2022   Decreased range of motion of neck 04/02/2022   Neck pain 04/02/2022   Nausea & vomiting 12/18/2021   Hepatocellular carcinoma (HCC) 11/23/2021   Portal hypertensive gastropathy (HCC)    Secondary esophageal varices without bleeding (HCC)    Esophageal candidiasis (HCC)    Malnutrition of moderate degree 11/01/2021   Liver mass    Gastro-esophageal  reflux disease without esophagitis 10/31/2021   Type 2 diabetes mellitus (HCC) 10/31/2021   Portal vein thrombosis with liver mass concerning for malignancy  10/31/2021    Cocaine abuse, uncomplicated (HCC) 01/21/2021   Stage 3b chronic kidney disease (HCC) 11/06/2020   Chronic low back pain 03/06/2020   Hyperlipidemia 03/06/2020   Hypertension 03/06/2020   Acute on chronic blood loss anemia 05/13/2019   Hepatic cirrhosis due to chronic hepatitis C infection (HCC) 05/12/2008   PCP:  [name], MD Pharmacy:   [facility] - [place], [place] - [address] [place] [address] Phone: [phone] Fax: [phone]     Social Determinants of Health (SDOH) Social History: SDOH Screenings   Food Insecurity: No Food Insecurity (04/21/2023)  Housing: Low Risk  (04/21/2023)  Transportation Needs: No Transportation Needs (04/21/2023)  Utilities: Not At Risk (04/21/2023)  Tobacco Use: Medium Risk (04/21/2023)   SDOH Interventions:     Readmission Risk Interventions     No data to display

## 2023-04-22 NOTE — Progress Notes (Signed)
PROGRESS NOTE  Troy [name] NFA:[id number] DOB: [birth year]   PCP: [name], MD  Patient is from: Home.  Lives with wife.  Independently ambulates at baseline.  DOA: 04/21/2023 LOS: 1  Chief complaints Chief Complaint  Patient presents with   Abdominal Pain   Chest Pain     Brief Narrative / Interim history: 67 y.o. male with PMH of metastatic hepatocellular carcinoma, IDDM-2, HTN, chronic back pain, GI bleed/GAVE, HTN and thrombocytopenia sent to ED from oncology office due to chills, rigors, nausea and episode of diarrhea, and admitted with working diagnosis of SIRS, lactic acidosis, nausea and diarrhea.  Patient was at oncology office and has his port flushed after which he felt nauseous, chills and rigors.  He also had an episode of diarrhea.  He was brought to ED. workup in ED significant for lactic acidosis, leukopenia with lymphopenia and CT abdomen pelvis with evidence of progressive hepatocellular carcinoma with metastasis.  Cultures obtained.  Patient was started on broad-spectrum antibiotics due to concern for severe sepsis.  The next day, blood culture with GNR.  Antibiotic de-escalated to IV cefepime.    Subjective: Seen and examined earlier this morning.  No major events overnight of this morning.  Feels much better today.  No complaints.  Objective: Vitals:   04/21/23 2251 04/22/23 0242 04/22/23 0555 04/22/23 1227  BP: 137/73 96/63 118/84 118/84  Pulse: (!) 110 85 90 83  Resp: 20   18  Temp: 99.1 F (37.3 C) 97.8 F (36.6 C) 97.9 F (36.6 C) 98 F (36.7 C)  TempSrc: Oral Oral Oral Oral  SpO2:  98% 99% 100%  Weight:      Height:        Examination:  GENERAL: No apparent distress.  Nontoxic. HEENT: MMM.  Vision and hearing grossly intact.  NECK: Supple.  No apparent JVD.  RESP:  No IWOB.  Fair aeration bilaterally. CVS:  RRR. Heart sounds normal.  ABD/GI/GU: BS+. Abd soft, NTND.  MSK/EXT:  Moves extremities. No apparent deformity. No edema.  SKIN:  no apparent skin lesion or wound NEURO: Awake, alert and oriented appropriately.  No apparent focal neuro deficit. PSYCH: Calm. Normal affect.   Procedures:  None  Microbiology summarized: Blood culture with GNR.  Assessment and plan: Severe sepsis due to gram-negative bacteremia: POA.  Had tachycardia, leukopenia with lactic acidosis.  Presented with chills, rigors, nausea and diarrhea.  Blood culture with GNR.  Speciation pending.  Received 2 L of IV fluid bolus in ED and continued on maintenance. CT angio chest, abdomen and pelvis concerning for progressive hepatocellular carcinoma with metastasis.  Urinalysis unrevealing. -De-escalate antibiotics to IV cefepime pending culture speciation and sensitivity -Follow blood cultures -Continue IV fluid IV NS at 100 cc an hour -Continue cycling lactic acid   Metastatic hepatocellular carcinoma: CT angio chest/abdomen/pelvis with progressive disease.  Per oncology note, patient was not interested in further chemotherapy and preferred symptom management.  He is followed by palliative medicine.  -Added oncology to treatment team -Palliative care consulted.   IDDM-2 with hyperglycemia: A1c 8.4% (4.8% in 10/2022).  Since he is on Lantus, Jardiance and metformin at home Recent Labs  Lab 04/21/23 2125 04/22/23 0835 04/22/23 1134  GLUCAP 179* 192* 148*  -Continue SSI-sensitive   Nausea and diarrhea: Likely due to underlying malignancy and sepsis.  Abdominal exam benign except for mild left-sided tenderness.  No radiologic evidence of colitis.  Seems to have resolved.  Was unable to give stool sample for C. difficile testing. -Continue  IV fluid, antiemetics and antibiotics as above  Normocytic anemia: Noted some drop in Hgb likely dilutional from IV fluid.  No signs of overt bleeding Recent Labs    11/24/22 0251 11/25/22 0436 12/16/22 1242 01/13/23 1422 02/24/23 1310 03/10/23 2235 03/11/23 1830 04/21/23 1359 04/21/23 1600 04/22/23 0503   HGB 13.4 12.9* 12.7* 12.4* 11.4* 12.1* 11.5* 10.4* 11.1* 9.1*  -Continue monitoring  Essential hypertension: Normotensive. -Continue home amlodipine -Hold lisinopril -IV labetalol as needed   Thrombocytopenia: Acute on chronic.  Likely due to sepsis. -Continue monitoring   Generalized weakness -PT/OT-no need identified.  Leukopenia: Resolved.  Now with leukocytosis. -Continue monitoring  Non-anion gap metabolic acidosis: Due to GI loss and IVF.  Diarrhea symptoms resolved. -Start sodium bicarbonate tablet twice daily.  Hyponatremia: Mild -Continue monitoring  Increased nutrient needs Body mass index is 19.89 kg/m. -Consult dietitian          DVT prophylaxis:  enoxaparin (LOVENOX) injection 40 mg Start: 04/21/23 2200  Code Status: Full code Family Communication: None at bedside Level of care: Progressive Status is: Inpatient Remains inpatient appropriate because: Severe sepsis due to gram-negative bacteremia   Final disposition: Home Consultants:  Oncology Palliative medicine  55 minutes with more than 50% spent in reviewing records, counseling patient/family and coordinating care.   Sch Meds:  Scheduled Meds:  enoxaparin (LOVENOX) injection  40 mg Subcutaneous Q24H   insulin aspart  0-5 Units Subcutaneous QHS   insulin aspart  0-9 Units Subcutaneous TID WC   pantoprazole  40 mg Oral BID AC   Continuous Infusions:  sodium chloride 100 mL/hr at 04/22/23 0842   ceFEPime (MAXIPIME) IV Stopped (04/22/23 0449)   PRN Meds:.acetaminophen **OR** acetaminophen, amLODipine, labetalol, ondansetron **OR** ondansetron (ZOFRAN) IV, oxyCODONE  Antimicrobials: Anti-infectives (From admission, onward)    Start     Dose/Rate Route Frequency Ordered Stop   04/22/23 2000  vancomycin (VANCOREADY) IVPB 1250 mg/250 mL  Status:  Discontinued        1,250 mg 166.7 mL/hr over 90 Minutes Intravenous Every 24 hours 04/21/23 2034 04/22/23 0942   04/22/23 0500  metroNIDAZOLE  (FLAGYL) IVPB 500 mg  Status:  Discontinued        500 mg 100 mL/hr over 60 Minutes Intravenous Every 12 hours 04/21/23 1949 04/22/23 0942   04/22/23 0400  ceFEPIme (MAXIPIME) 2 g in sodium chloride 0.9 % 100 mL IVPB        2 g 200 mL/hr over 30 Minutes Intravenous Every 12 hours 04/21/23 2023     04/21/23 1630  Vancomycin (VANCOCIN) 1,500 mg in sodium chloride 0.9 % 500 mL IVPB        1,500 mg 250 mL/hr over 120 Minutes Intravenous  Once 04/21/23 1617 04/21/23 2023   04/21/23 1615  ceFEPIme (MAXIPIME) 2 g in sodium chloride 0.9 % 100 mL IVPB        2 g 200 mL/hr over 30 Minutes Intravenous  Once 04/21/23 1613 04/21/23 1648   04/21/23 1615  metroNIDAZOLE (FLAGYL) IVPB 500 mg        500 mg 100 mL/hr over 60 Minutes Intravenous  Once 04/21/23 1613 04/21/23 1823   04/21/23 1615  vancomycin (VANCOCIN) IVPB 1000 mg/200 mL premix  Status:  Discontinued        1,000 mg 200 mL/hr over 60 Minutes Intravenous  Once 04/21/23 1613 04/21/23 1617        I have personally reviewed the following labs and images: CBC: Recent Labs  Lab 04/21/23 1359 04/21/23 1600 04/22/23  0503  WBC 6.3 2.5* 13.5*  NEUTROABS 5.0 2.2  --   HGB 10.4* 11.1* 9.1*  HCT 31.2* 33.5* 27.9*  MCV 92.6 94.6 97.2  PLT 114* 104* 87*   BMP &GFR Recent Labs  Lab 04/21/23 1600 04/22/23 0503  NA 137 131*  K 3.9 4.3  CL 104 106  CO2 20* 18*  GLUCOSE 186* 281*  BUN 19 19  CREATININE 1.22 1.24  CALCIUM 9.5 7.9*   Estimated Creatinine Clearance: 51.5 mL/min (by C-G formula based on SCr of 1.24 mg/dL). Liver & Pancreas: Recent Labs  Lab 04/21/23 1600 04/22/23 0503  AST 97* 80*  ALT 34 28  ALKPHOS 281* 190*  BILITOT 1.3* 0.8  PROT 8.4* 6.4*  ALBUMIN 2.6* 2.0*   Recent Labs  Lab 04/21/23 1600  LIPASE 27   No results for input(s): "AMMONIA" in the last 168 hours. Diabetic: Recent Labs    04/21/23 1600  HGBA1C 8.4*   Recent Labs  Lab 04/21/23 2125 04/22/23 0835 04/22/23 1134  GLUCAP 179* 192* 148*    Cardiac Enzymes: No results for input(s): "CKTOTAL", "CKMB", "CKMBINDEX", "TROPONINI" in the last 168 hours. No results for input(s): "PROBNP" in the last 8760 hours. Coagulation Profile: Recent Labs  Lab 04/21/23 1600  INR 1.2   Thyroid Function Tests: No results for input(s): "TSH", "T4TOTAL", "FREET4", "T3FREE", "THYROIDAB" in the last 72 hours. Lipid Profile: No results for input(s): "CHOL", "HDL", "LDLCALC", "TRIG", "CHOLHDL", "LDLDIRECT" in the last 72 hours. Anemia Panel: Recent Labs    04/21/23 1343  FERRITIN 118   Urine analysis:    Component Value Date/Time   COLORURINE YELLOW 04/21/2023 1729   APPEARANCEUR CLEAR 04/21/2023 1729   LABSPEC 1.011 04/21/2023 1729   PHURINE 5.0 04/21/2023 1729   GLUCOSEU NEGATIVE 04/21/2023 1729   HGBUR SMALL (A) 04/21/2023 1729   BILIRUBINUR NEGATIVE 04/21/2023 1729   KETONESUR NEGATIVE 04/21/2023 1729   PROTEINUR NEGATIVE 04/21/2023 1729   NITRITE NEGATIVE 04/21/2023 1729   LEUKOCYTESUR NEGATIVE 04/21/2023 1729   Sepsis Labs: Invalid input(s): "PROCALCITONIN", "LACTICIDVEN"  Microbiology: Recent Results (from the past 240 hour(s))  Culture, blood (routine x 2)     Status: None (Preliminary result)   Collection Time: 04/21/23  4:00 PM   Specimen: BLOOD  Result Value Ref Range Status   Specimen Description   Final    BLOOD LEFT ANTECUBITAL Performed at [facility], [address] [place], [place] [address]    Special Requests   Final    BOTTLES DRAWN AEROBIC AND ANAEROBIC Blood Culture adequate volume Performed at [facility], [address] [place], [place] [address]    Culture  Setup Time   Final    GRAM NEGATIVE RODS IN BOTH AEROBIC AND ANAEROBIC BOTTLES Organism ID to follow CRITICAL RESULT CALLED TO, READ BACK BY AND VERIFIED WITH: A. PHAM PHARMD, AT [id number] 04/22/23 D.VANHOOK Performed at [facility], [address] [place], [place] [address]    Culture GRAM  NEGATIVE RODS  Final   Report Status PENDING  Incomplete  Resp panel by RT-PCR (RSV, Flu A&B, Covid) Anterior Nasal Swab     Status: None   Collection Time: 04/21/23  4:00 PM   Specimen: Anterior Nasal Swab  Result Value Ref Range Status   SARS Coronavirus 2 by RT PCR NEGATIVE NEGATIVE Final    Comment: (NOTE) SARS-CoV-2 target nucleic acids are NOT DETECTED.  The SARS-CoV-2 RNA is generally detectable in upper respiratory specimens during the acute phase  of infection. The lowest concentration of SARS-CoV-2 viral copies this assay can detect is 138 copies/mL. A negative result does not preclude SARS-Cov-2 infection and should not be used as the sole basis for treatment or other patient management decisions. A negative result may occur with  improper specimen collection/handling, submission of specimen other than nasopharyngeal swab, presence of viral mutation(s) within the areas targeted by this assay, and inadequate number of viral copies(<138 copies/mL). A negative result must be combined with clinical observations, patient history, and epidemiological information. The expected result is Negative.  Fact Sheet for Patients:  BloggerCourse.com  Fact Sheet for Healthcare Providers:  SeriousBroker.it  This test is no t yet approved or cleared by the [place] FDA and  has been authorized for detection and/or diagnosis of SARS-CoV-2 by FDA under an Emergency Use Authorization (EUA). This EUA will remain  in effect (meaning this test can be used) for the duration of the COVID-19 declaration under Section 564(b)(1) of the Act, 21 U.S.C.section 360bbb-3(b)(1), unless the authorization is terminated  or revoked sooner.       Influenza A by PCR NEGATIVE NEGATIVE Final   Influenza B by PCR NEGATIVE NEGATIVE Final    Comment: (NOTE) The Xpert Xpress SARS-CoV-2/FLU/RSV plus assay is intended as an aid in the diagnosis of influenza  from Nasopharyngeal swab specimens and should not be used as a sole basis for treatment. Nasal washings and aspirates are unacceptable for Xpert Xpress SARS-CoV-2/FLU/RSV testing.  Fact Sheet for Patients: BloggerCourse.com  Fact Sheet for Healthcare Providers: SeriousBroker.it  This test is not yet approved or cleared by the [place] FDA and has been authorized for detection and/or diagnosis of SARS-CoV-2 by FDA under an Emergency Use Authorization (EUA). This EUA will remain in effect (meaning this test can be used) for the duration of the COVID-19 declaration under Section 564(b)(1) of the Act, 21 U.S.C. section 360bbb-3(b)(1), unless the authorization is terminated or revoked.     Resp Syncytial Virus by PCR NEGATIVE NEGATIVE Final    Comment: (NOTE) Fact Sheet for Patients: BloggerCourse.com  Fact Sheet for Healthcare Providers: SeriousBroker.it  This test is not yet approved or cleared by the [place] FDA and has been authorized for detection and/or diagnosis of SARS-CoV-2 by FDA under an Emergency Use Authorization (EUA). This EUA will remain in effect (meaning this test can be used) for the duration of the COVID-19 declaration under Section 564(b)(1) of the Act, 21 U.S.C. section 360bbb-3(b)(1), unless the authorization is terminated or revoked.  Performed at [facility], [address], [place], [place] [address]   Blood Culture ID Panel (Reflexed)     Status: Abnormal   Collection Time: 04/21/23  4:00 PM  Result Value Ref Range Status   Enterococcus faecalis NOT DETECTED NOT DETECTED Final   Enterococcus Faecium NOT DETECTED NOT DETECTED Final   Listeria monocytogenes NOT DETECTED NOT DETECTED Final   Staphylococcus species NOT DETECTED NOT DETECTED Final   Staphylococcus aureus (BCID) NOT DETECTED NOT DETECTED Final   Staphylococcus  epidermidis NOT DETECTED NOT DETECTED Final   Staphylococcus lugdunensis NOT DETECTED NOT DETECTED Final   Streptococcus species NOT DETECTED NOT DETECTED Final   Streptococcus agalactiae NOT DETECTED NOT DETECTED Final   Streptococcus pneumoniae NOT DETECTED NOT DETECTED Final   Streptococcus pyogenes NOT DETECTED NOT DETECTED Final   A.calcoaceticus-baumannii NOT DETECTED NOT DETECTED Final   Bacteroides fragilis NOT DETECTED NOT DETECTED Final   Enterobacterales DETECTED (A) NOT DETECTED Final    Comment:  Enterobacterales represent a large order of gram negative bacteria, not a single organism. Refer to culture for further identification. CRITICAL RESULT CALLED TO, READ BACK BY AND VERIFIED WITH: A. PHAM PHARMD, AT [id number] 04/22/23 D.VANHOOK    Enterobacter cloacae complex NOT DETECTED NOT DETECTED Final   Escherichia coli NOT DETECTED NOT DETECTED Final   Klebsiella aerogenes NOT DETECTED NOT DETECTED Final   Klebsiella oxytoca NOT DETECTED NOT DETECTED Final   Klebsiella pneumoniae NOT DETECTED NOT DETECTED Final   Proteus species NOT DETECTED NOT DETECTED Final   Salmonella species NOT DETECTED NOT DETECTED Final   Serratia marcescens NOT DETECTED NOT DETECTED Final   Haemophilus influenzae NOT DETECTED NOT DETECTED Final   Neisseria meningitidis NOT DETECTED NOT DETECTED Final   Pseudomonas aeruginosa NOT DETECTED NOT DETECTED Final   Stenotrophomonas maltophilia NOT DETECTED NOT DETECTED Final   Candida albicans NOT DETECTED NOT DETECTED Final   Candida auris NOT DETECTED NOT DETECTED Final   Candida glabrata NOT DETECTED NOT DETECTED Final   Candida krusei NOT DETECTED NOT DETECTED Final   Candida parapsilosis NOT DETECTED NOT DETECTED Final   Candida tropicalis NOT DETECTED NOT DETECTED Final   Cryptococcus neoformans/gattii NOT DETECTED NOT DETECTED Final   CTX-M ESBL NOT DETECTED NOT DETECTED Final   Carbapenem resistance IMP NOT DETECTED NOT DETECTED Final   Carbapenem  resistance KPC NOT DETECTED NOT DETECTED Final   Carbapenem resistance NDM NOT DETECTED NOT DETECTED Final   Carbapenem resist OXA 48 LIKE NOT DETECTED NOT DETECTED Final   Carbapenem resistance VIM NOT DETECTED NOT DETECTED Final    Comment: Performed at [facility], [address] [place], [place] [address]    Radiology Studies: CT Angio Chest PE W and/or Wo Contrast  Result Date: 04/21/2023 CLINICAL DATA:  Chest abdominal pain, nausea and vomiting, history of metastatic hepatocellular carcinoma. EXAM: CT ANGIOGRAPHY CHEST CT ABDOMEN AND PELVIS WITH CONTRAST TECHNIQUE: Multidetector CT imaging of the chest was performed using the standard protocol during bolus administration of intravenous contrast. Multiplanar CT image reconstructions and MIPs were obtained to evaluate the vascular anatomy. Multidetector CT imaging of the abdomen and pelvis was performed using the standard protocol during bolus administration of intravenous contrast. RADIATION DOSE REDUCTION: This exam was performed according to the departmental dose-optimization program which includes automated exposure control, adjustment of the mA and/or kV according to patient size and/or use of iterative reconstruction technique. CONTRAST:  OMNIPAQUE IOHEXOL 350 MG/ML SOLN COMPARISON:  03/11/2023, 04/21/2023 FINDINGS: CTA CHEST FINDINGS Cardiovascular: This is a technically adequate evaluation of the pulmonary vasculature. No filling defects or pulmonary emboli. Heart is unremarkable without pericardial effusion. Moderate left atrial dilatation. No evidence of thoracic aortic aneurysm or dissection. Atherosclerosis of the aorta and coronary vasculature. Mediastinum/Nodes: No enlarged mediastinal, hilar, or axillary lymph nodes. Thyroid gland, trachea, and esophagus demonstrate no significant findings. Large varices surround the distal esophagus and gastroesophageal junction. Lungs/Pleura: Bibasilar scarring and hypoventilatory  changes. New subpleural nodule within the left upper lobe abutting the mediastinal margin, measuring 9 cm reference image 69/2. Musculoskeletal: Shrapnel again seen at the left lung base. No acute or destructive bony abnormalities. Reconstructed images demonstrate no additional findings. Review of the MIP images confirms the above findings. CT ABDOMEN and PELVIS FINDINGS Hepatobiliary: Infiltrative heterogeneous mass occupies the entirety of the left lobe liver, measuring up to 6.5 x 9.9 cm reference image 20/4, increased since prior exam consistent with progressive hepatocellular carcinoma. Stable chronic gallbladder wall thickening. Pancreas: Unremarkable. No pancreatic  ductal dilatation or surrounding inflammatory changes. Spleen: Normal in size without focal abnormality. Adrenals/Urinary Tract: Kidneys enhance normally and symmetrically. The adrenals are unremarkable. Bladder is moderately distended without focal abnormality. Stomach/Bowel: No bowel obstruction or ileus. Normal appendix right lower quadrant. There is progressive mural thickening of the gastric antrum and proximal duodenum, now measuring up to 9 mm. Vascular/Lymphatic: Progressive mesenteric and retroperitoneal adenopathy consistent with worsening metastatic disease. Index lymph node masses as follows: Left para-aortic, image 41/4, 6.9 x 5.0 cm. Previously 6.0 x 4.1 cm. Left para-aortic, image 34/4, 3.7 cm.  Previously 3.5 cm. Porta hepatis, image 27/4, 3.1 cm.  Previously 2.6 cm. 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 Stable atherosclerosis of the aorta and its branches. Reproductive: Prostate is unremarkable. Other: No free fluid or free intraperitoneal gas. No abdominal wall hernia. Musculoskeletal: Enlarging soft tissue mass within the right rectus sheath, a measuring up to 2.4 cm reference image 81/4, previously measuring  1.8 cm, consistent with metastatic disease. No acute or destructive bony abnormalities. Reconstructed images demonstrate no additional findings. Review of the MIP images confirms the above findings. IMPRESSION: Chest: 1. No evidence of pulmonary embolus. 2. New subpleural left upper lobe 9 mm pulmonary nodule. Metastatic disease not excluded. Continued attention on follow-up recommended. 3. Aortic Atherosclerosis (ICD10-I70.0). Coronary artery atherosclerosis. Abdomen/pelvis: 1. Enlarging heterogeneous left lobe liver mass consistent with progression of hepatocellular carcinoma. 2. Progressive lymphadenopathy within the porta hepatis, mesentery, and retroperitoneum, consistent with worsening nodal metastases. 3. Enlarging soft tissue mass within the inferior right rectus sheath, consistent with progressive metastatic disease. 4. 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 5. Progressive mural thickening of the gastric antral wall and proximal duodenum, nonspecific. 6. Chronic gallbladder wall thickening without evidence of calcified gallstones. 7.  Aortic Atherosclerosis (ICD10-I70.0). Electronically Signed   By: Sharlet Salina M.D.   On: 04/21/2023 18:28   CT ABDOMEN PELVIS W CONTRAST  Result Date: 04/21/2023 CLINICAL DATA:  Chest abdominal pain, nausea and vomiting, history of metastatic hepatocellular carcinoma. EXAM: CT ANGIOGRAPHY CHEST CT ABDOMEN AND PELVIS WITH CONTRAST TECHNIQUE: Multidetector CT imaging of the chest was performed using the standard protocol during bolus administration of intravenous contrast. Multiplanar CT image reconstructions and MIPs were obtained to evaluate the vascular anatomy. Multidetector CT imaging of the abdomen and pelvis was performed using the standard protocol during bolus administration of intravenous contrast. RADIATION DOSE REDUCTION: This exam was performed according to the departmental  dose-optimization program which includes automated exposure control, adjustment of the mA and/or kV according to patient size and/or use of iterative reconstruction technique. CONTRAST:  OMNIPAQUE IOHEXOL 350 MG/ML SOLN COMPARISON:  03/11/2023, 04/21/2023 FINDINGS: CTA CHEST FINDINGS Cardiovascular: This is a technically adequate evaluation of the pulmonary vasculature. No filling defects or pulmonary emboli. Heart is unremarkable without pericardial effusion. Moderate left atrial dilatation. No evidence of thoracic aortic aneurysm or dissection. Atherosclerosis of the aorta and coronary vasculature. Mediastinum/Nodes: No enlarged mediastinal, hilar, or axillary lymph nodes. Thyroid gland, trachea, and esophagus demonstrate no significant findings. Large varices surround the distal esophagus and gastroesophageal junction. Lungs/Pleura: Bibasilar scarring and hypoventilatory changes. New subpleural nodule within the left upper lobe abutting the mediastinal margin, measuring 9 cm reference image 69/2. Musculoskeletal: Shrapnel again seen at the left lung base. No acute or destructive bony abnormalities. Reconstructed images demonstrate no additional findings. Review of the MIP  images confirms the above findings. CT ABDOMEN and PELVIS FINDINGS Hepatobiliary: Infiltrative heterogeneous mass occupies the entirety of the left lobe liver, measuring up to 6.5 x 9.9 cm reference image 20/4, increased since prior exam consistent with progressive hepatocellular carcinoma. Stable chronic gallbladder wall thickening. Pancreas: Unremarkable. No pancreatic ductal dilatation or surrounding inflammatory changes. Spleen: Normal in size without focal abnormality. Adrenals/Urinary Tract: Kidneys enhance normally and symmetrically. The adrenals are unremarkable. Bladder is moderately distended without focal abnormality. Stomach/Bowel: No bowel obstruction or ileus. Normal appendix right lower quadrant. There is progressive mural  thickening of the gastric antrum and proximal duodenum, now measuring up to 9 mm. Vascular/Lymphatic: Progressive mesenteric and retroperitoneal adenopathy consistent with worsening metastatic disease. Index lymph node masses as follows: Left para-aortic, image 41/4, 6.9 x 5.0 cm. Previously 6.0 x 4.1 cm. Left para-aortic, image 34/4, 3.7 cm.  Previously 3.5 cm. Porta hepatis, image 27/4, 3.1 cm.  Previously 2.6 cm. 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 Stable atherosclerosis of the aorta and its branches. Reproductive: Prostate is unremarkable. Other: No free fluid or free intraperitoneal gas. No abdominal wall hernia. Musculoskeletal: Enlarging soft tissue mass within the right rectus sheath, a measuring up to 2.4 cm reference image 81/4, previously measuring 1.8 cm, consistent with metastatic disease. No acute or destructive bony abnormalities. Reconstructed images demonstrate no additional findings. Review of the MIP images confirms the above findings. IMPRESSION: Chest: 1. No evidence of pulmonary embolus. 2. New subpleural left upper lobe 9 mm pulmonary nodule. Metastatic disease not excluded. Continued attention on follow-up recommended. 3. Aortic Atherosclerosis (ICD10-I70.0). Coronary artery atherosclerosis. Abdomen/pelvis: 1. Enlarging heterogeneous left lobe liver mass consistent with progression of hepatocellular carcinoma. 2. Progressive lymphadenopathy within the porta hepatis, mesentery, and retroperitoneum, consistent with worsening nodal metastases. 3. Enlarging soft tissue mass within the inferior right rectus sheath, consistent with progressive metastatic disease. 4. 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 5. Progressive mural thickening of the gastric antral wall and proximal duodenum,  nonspecific. 6. Chronic gallbladder wall thickening without evidence of calcified gallstones. 7.  Aortic Atherosclerosis (ICD10-I70.0). Electronically Signed   By: Sharlet Salina M.D.   On: 04/21/2023 18:28   DG Chest Portable 1 View  Result Date: 04/21/2023 CLINICAL DATA:  Chest pain EXAM: PORTABLE CHEST 1 VIEW COMPARISON:  Chest x-ray 11/21/2022 FINDINGS: Underinflation. Minimal basilar atelectasis. No pneumothorax, effusion or edema. Normal cardiopericardial silhouette. Overlapping cardiac leads. Several punctate metallic foci overlie the lower left chest and upper abdomen. Please correlate with history. Right IJ chest port in place with tip overlying the right atrium. Likely calcified right lung base nodule stable. IMPRESSION: Underinflation with slight basilar atelectasis.  Chest port. Possible multiple radiopaque foreign bodies along the lower left chest and upper abdomen. Please correlate with the history Electronically Signed   By: Karen Kays M.D.   On: 04/21/2023 17:52      Danetra Glock T. Ailynn Gow [facility]  If 7PM-7AM, please contact night-coverage [email or web address] 04/22/2023, 2:43 PM

## 2023-04-22 NOTE — Evaluation (Signed)
Physical Therapy Evaluation Patient Details Name: Troy [name] MRN: [id number] DOB: 1955-07-23 Today's Date: 04/22/2023  History of Present Illness  67 y.o. male sent to ED from oncology office due to chills, rigors and nausea. Dx of SIRS. Pt with PMH of metastatic hepatocellular carcinoma, IDDM-2, HTN, chronic back pain, GI bleed/GAVE, HTN and thrombocytopenia  Clinical Impression  Pt is mobilizing well at an independent level. He ambulated 300' without an assistive device, no loss of balance, vital signs stable. He denies falls in past 6 months. No further PT indicated, will sign off. Pt was encouraged to walk in the halls independently to minimize deconditioning during hospitalization.          If plan is discharge home, recommend the following:     Can travel by private vehicle        Equipment Recommendations None recommended by PT  Recommendations for Other Services       Functional Status Assessment Patient has not had a recent decline in their functional status     Precautions / Restrictions Precautions Precautions: None Precaution Comments: denies falls in past 6 months Restrictions Weight Bearing Restrictions: No      Mobility  Bed Mobility Overal bed mobility: Modified Independent             General bed mobility comments: HOB up, used rail    Transfers Overall transfer level: Independent Equipment used: None                    Ambulation/Gait Ambulation/Gait assistance: Independent Gait Distance (Feet): 300 Feet Assistive device: None Gait Pattern/deviations: WFL(Within Functional Limits) Gait velocity: WNL     General Gait Details: steady, no loss of balance, HR 108 walking, no dyspnea  Stairs            Wheelchair Mobility     Tilt Bed    Modified Rankin (Stroke Patients Only)       Balance Overall balance assessment: No apparent balance deficits (not formally assessed)                                            Pertinent Vitals/Pain Pain Assessment Pain Assessment: 0-10 Pain Score: 3  Pain Location: L lower abdomen (reports this is chronic) Pain Descriptors / Indicators: Aching Pain Intervention(s): Limited activity within patient's tolerance, Monitored during session    Home Living                          Prior Function                       Extremity/Trunk Assessment   Upper Extremity Assessment Upper Extremity Assessment: Overall WFL for tasks assessed    Lower Extremity Assessment Lower Extremity Assessment: Overall WFL for tasks assessed    Cervical / Trunk Assessment Cervical / Trunk Assessment: Normal  Communication   Communication Communication: No apparent difficulties  Cognition Arousal: Alert Behavior During Therapy: WFL for tasks assessed/performed Overall Cognitive Status: Within Functional Limits for tasks assessed                                          General Comments      Exercises     Assessment/Plan  PT Assessment Patient does not need any further PT services  PT Problem List         PT Treatment Interventions      PT Goals (Current goals can be found in the Care Plan section)  Acute Rehab PT Goals PT Goal Formulation: All assessment and education complete, DC therapy    Frequency       Co-evaluation               AM-PAC PT "6 Clicks" Mobility  Outcome Measure Help needed turning from your back to your side while in a flat bed without using bedrails?: None Help needed moving from lying on your back to sitting on the side of a flat bed without using bedrails?: None Help needed moving to and from a bed to a chair (including a wheelchair)?: None Help needed standing up from a chair using your arms (e.g., wheelchair or bedside chair)?: None Help needed to walk in hospital room?: None Help needed climbing 3-5 steps with a railing? : None 6 Click Score: 24    End of Session    Activity Tolerance: Patient tolerated treatment well Patient left: in chair;with call bell/phone within reach Nurse Communication: Mobility status      Time: 0946-1000 PT Time Calculation (min) (ACUTE ONLY): 14 min   Charges:   PT Evaluation $PT Eval Low Complexity: 1 Low   PT General Charges $$ ACUTE PT VISIT: 1 Visit         Tamala Ser PT 04/22/2023  Acute Rehabilitation Services  Office [phone]

## 2023-04-22 NOTE — Progress Notes (Signed)
   04/22/23 1500  Spiritual Encounters  Type of Visit Initial  Care provided to: Pt and family  Referral source Patient request  Reason for visit Advance directives  OnCall Visit No  Spiritual Framework  Presenting Themes Caregiving needs;Impactful experiences and emotions;Courage hope and growth  Community/Connection Family;Significant other;Friend(s);Faith community  Patient Stress Factors Health changes  Interventions  Spiritual Care Interventions Made Established relationship of care and support;Compassionate presence;Reflective listening;Encouragement   Chaplain received a spiritual consult for an AD. Chaplain went to to educate the patient and his wife about the AD. Patient stated that his wife is already his POA and that he did not want to make any changes at this time. Chaplain services remain available for spiritual and emotional support.

## 2023-04-22 NOTE — Progress Notes (Signed)
PHARMACY - PHYSICIAN COMMUNICATION CRITICAL VALUE ALERT - BLOOD CULTURE IDENTIFICATION (BCID)  Troy [name] is an 66 y.o. male who presented to [facility] on 04/21/2023 with a chief complaint of abdominal pain and chest pain. He was at oncology appointment and had port flushed then immediately felt chills and rigors.  Assessment:   12/10 Bcx 2/2 bottles (one set collected) Enterobacterales, species pending further identification  Name of physician (or Provider) Contacted: Dr. Alanda Slim  Current antibiotics: vancomycin, cefepime, metronidazole  Changes to prescribed antibiotics recommended: de-escalate to cefepime alone pending further information  Results for orders placed or performed during the hospital encounter of 04/21/23  Blood Culture ID Panel (Reflexed) (Collected: 04/21/2023  4:00 PM)  Result Value Ref Range   Enterococcus faecalis NOT DETECTED NOT DETECTED   Enterococcus Faecium NOT DETECTED NOT DETECTED   Listeria monocytogenes NOT DETECTED NOT DETECTED   Staphylococcus species NOT DETECTED NOT DETECTED   Staphylococcus aureus (BCID) NOT DETECTED NOT DETECTED   Staphylococcus epidermidis NOT DETECTED NOT DETECTED   Staphylococcus lugdunensis NOT DETECTED NOT DETECTED   Streptococcus species NOT DETECTED NOT DETECTED   Streptococcus agalactiae NOT DETECTED NOT DETECTED   Streptococcus pneumoniae NOT DETECTED NOT DETECTED   Streptococcus pyogenes NOT DETECTED NOT DETECTED   A.calcoaceticus-baumannii NOT DETECTED NOT DETECTED   Bacteroides fragilis NOT DETECTED NOT DETECTED   Enterobacterales DETECTED (A) NOT DETECTED   Enterobacter cloacae complex NOT DETECTED NOT DETECTED   Escherichia coli NOT DETECTED NOT DETECTED   Klebsiella aerogenes NOT DETECTED NOT DETECTED   Klebsiella oxytoca NOT DETECTED NOT DETECTED   Klebsiella pneumoniae NOT DETECTED NOT DETECTED   Proteus species NOT DETECTED NOT DETECTED   Salmonella species NOT DETECTED NOT DETECTED   Serratia marcescens  NOT DETECTED NOT DETECTED   Haemophilus influenzae NOT DETECTED NOT DETECTED   Neisseria meningitidis NOT DETECTED NOT DETECTED   Pseudomonas aeruginosa NOT DETECTED NOT DETECTED   Stenotrophomonas maltophilia NOT DETECTED NOT DETECTED   Candida albicans NOT DETECTED NOT DETECTED   Candida auris NOT DETECTED NOT DETECTED   Candida glabrata NOT DETECTED NOT DETECTED   Candida krusei NOT DETECTED NOT DETECTED   Candida parapsilosis NOT DETECTED NOT DETECTED   Candida tropicalis NOT DETECTED NOT DETECTED   Cryptococcus neoformans/gattii NOT DETECTED NOT DETECTED   CTX-M ESBL NOT DETECTED NOT DETECTED   Carbapenem resistance IMP NOT DETECTED NOT DETECTED   Carbapenem resistance KPC NOT DETECTED NOT DETECTED   Carbapenem resistance NDM NOT DETECTED NOT DETECTED   Carbapenem resist OXA 48 LIKE NOT DETECTED NOT DETECTED   Carbapenem resistance VIM NOT DETECTED NOT DETECTED    Pricilla Riffle, PharmD, BCPS Clinical Pharmacist 04/22/2023 10:09 AM

## 2023-04-22 NOTE — Plan of Care (Signed)
Updated patient's wife over the phone with patient's permission

## 2023-04-23 DIAGNOSIS — Z515 Encounter for palliative care: Secondary | ICD-10-CM

## 2023-04-23 DIAGNOSIS — C220 Liver cell carcinoma: Secondary | ICD-10-CM

## 2023-04-23 DIAGNOSIS — D696 Thrombocytopenia, unspecified: Secondary | ICD-10-CM | POA: Diagnosis not present

## 2023-04-23 DIAGNOSIS — R6520 Severe sepsis without septic shock: Secondary | ICD-10-CM

## 2023-04-23 DIAGNOSIS — A419 Sepsis, unspecified organism: Secondary | ICD-10-CM | POA: Diagnosis not present

## 2023-04-23 DIAGNOSIS — R1084 Generalized abdominal pain: Secondary | ICD-10-CM

## 2023-04-23 DIAGNOSIS — Z7189 Other specified counseling: Secondary | ICD-10-CM

## 2023-04-23 DIAGNOSIS — E119 Type 2 diabetes mellitus without complications: Secondary | ICD-10-CM

## 2023-04-23 DIAGNOSIS — R6889 Other general symptoms and signs: Secondary | ICD-10-CM | POA: Diagnosis not present

## 2023-04-23 DIAGNOSIS — R079 Chest pain, unspecified: Secondary | ICD-10-CM

## 2023-04-23 DIAGNOSIS — E872 Acidosis, unspecified: Secondary | ICD-10-CM

## 2023-04-23 LAB — CBC
HCT: 28 % — ABNORMAL LOW (ref 39.0–52.0)
Hemoglobin: 9.2 g/dL — ABNORMAL LOW (ref 13.0–17.0)
MCH: 31.4 pg (ref 26.0–34.0)
MCHC: 32.9 g/dL (ref 30.0–36.0)
MCV: 95.6 fL (ref 80.0–100.0)
Platelets: 79 10*3/uL — ABNORMAL LOW (ref 150–400)
RBC: 2.93 MIL/uL — ABNORMAL LOW (ref 4.22–5.81)
RDW: 14.4 % (ref 11.5–15.5)
WBC: 8.7 10*3/uL (ref 4.0–10.5)
nRBC: 0 % (ref 0.0–0.2)

## 2023-04-23 LAB — GLUCOSE, CAPILLARY
Glucose-Capillary: 150 mg/dL — ABNORMAL HIGH (ref 70–99)
Glucose-Capillary: 147 mg/dL — ABNORMAL HIGH (ref 70–99)
Glucose-Capillary: 120 mg/dL — ABNORMAL HIGH (ref 70–99)
Glucose-Capillary: 153 mg/dL — ABNORMAL HIGH (ref 70–99)
Glucose-Capillary: 144 mg/dL — ABNORMAL HIGH (ref 70–99)

## 2023-04-23 LAB — COMPREHENSIVE METABOLIC PANEL
ALT: 31 U/L (ref 0–44)
AST: 90 U/L — ABNORMAL HIGH (ref 15–41)
Albumin: 2.2 g/dL — ABNORMAL LOW (ref 3.5–5.0)
Alkaline Phosphatase: 183 U/L — ABNORMAL HIGH (ref 38–126)
Anion gap: 7 (ref 5–15)
BUN: 23 mg/dL (ref 8–23)
CO2: 22 mmol/L (ref 22–32)
Calcium: 8.2 mg/dL — ABNORMAL LOW (ref 8.9–10.3)
Chloride: 108 mmol/L (ref 98–111)
Creatinine, Ser: 1.18 mg/dL (ref 0.61–1.24)
GFR, Estimated: >60 mL/min (ref >60)
Glucose, Bld: 161 mg/dL — ABNORMAL HIGH (ref 70–99)
Potassium: 3.6 mmol/L (ref 3.5–5.1)
Sodium: 137 mmol/L (ref 135–145)
Total Bilirubin: 0.7 mg/dL (ref <1.2)
Total Protein: 6.9 g/dL (ref 6.5–8.1)

## 2023-04-23 LAB — URINE CULTURE: Culture: NO GROWTH

## 2023-04-23 LAB — MAGNESIUM: Magnesium: 1.7 mg/dL (ref 1.7–2.4)

## 2023-04-23 LAB — LACTIC ACID, PLASMA: Lactic Acid, Venous: 2 mmol/L (ref 0.5–1.9)

## 2023-04-23 MED ORDER — CARVEDILOL 6.25 MG PO TABS
6.2500 mg | ORAL_TABLET | Freq: Two times a day (BID) | ORAL | Status: DC
  Administered 2023-04-23 – 2023-04-24 (×2): 6.25 mg via ORAL
  Filled 2023-04-23 (×2): qty 1

## 2023-04-23 NOTE — Progress Notes (Signed)
Pt has new scrotal edema, MD made aware and at bedside. No new orders.

## 2023-04-23 NOTE — Plan of Care (Signed)
  Problem: Coping: Goal: Ability to adjust to condition or change in health will improve Outcome: Progressing   Problem: Fluid Volume: Goal: Ability to maintain a balanced intake and output will improve Outcome: Progressing

## 2023-04-23 NOTE — Progress Notes (Signed)
PROGRESS NOTE  Troy [name] GNF:[id number] DOB: [birth year]   PCP: [name], MD  Patient is from: Home.  Lives with wife.  Independently ambulates at baseline.  DOA: 04/21/2023 LOS: 2  Chief complaints Chief Complaint  Patient presents with   Abdominal Pain   Chest Pain     Brief Narrative / Interim history: 67 y.o. male with PMH of metastatic hepatocellular carcinoma, IDDM-2, HTN, chronic back pain, GI bleed/GAVE, HTN and thrombocytopenia sent to ED from oncology office due to chills, rigors, nausea and episode of diarrhea, and admitted with working diagnosis of SIRS, lactic acidosis, nausea and diarrhea.  Patient was at oncology office and has his port flushed after which he felt nauseous, chills and rigors.  He also had an episode of diarrhea.  He was brought to ED. workup in ED significant for lactic acidosis, leukopenia with lymphopenia and CT abdomen pelvis with evidence of progressive hepatocellular carcinoma with metastasis.  Cultures obtained.  Patient was started on broad-spectrum antibiotics due to concern for severe sepsis.  The next day, blood culture with Klebsiella ornithinolytica.  Antibiotic de-escalated to IV cefepime.      Subjective: Seen and examined earlier this morning.  No major events overnight of this morning.  No complaints.  Patient's wife at bedside.  Objective: Vitals:   04/22/23 1227 04/22/23 2028 04/23/23 0455 04/23/23 1211  BP: 118/84 109/77 119/79 129/87  Pulse: 83 83 76 81  Resp: 18 18 18 20   Temp: 98 F (36.7 C) 97.6 F (36.4 C) 97.6 F (36.4 C) 98.5 F (36.9 C)  TempSrc: Oral Oral Oral Oral  SpO2: 100% 99% 100% 100%  Weight:      Height:        Examination:  GENERAL: No apparent distress.  Nontoxic. HEENT: MMM.  Vision and hearing grossly intact.  NECK: Supple.  No apparent JVD.  RESP:  No IWOB.  Fair aeration bilaterally. CVS:  RRR. Heart sounds normal.  ABD/GI/GU: BS+. Abd soft, NTND.  MSK/EXT:  Moves extremities. No  apparent deformity. No edema.  SKIN: no apparent skin lesion or wound NEURO: Awake, alert and oriented appropriately.  No apparent focal neuro deficit. PSYCH: Calm. Normal affect.   Procedures:  None  Microbiology summarized: Blood culture with Klebsiella ornithinolytica.  Only 1 set collected.  Assessment and plan: Severe sepsis due to gram-negative bacteremia: POA.  Had tachycardia, leukopenia with lactic acidosis.  Presented with chills, rigors, nausea and diarrhea.  Received 2 L of IV fluid bolus in ED and continued on maintenance. CT angio chest, abdomen and pelvis concerning for progressive hepatocellular carcinoma with metastasis.  Urinalysis and urine culture negative.  Blood cultures above. -Continue IV cefepime pending culture sensitivity -Will curbside ID -Discontinue IV fluid   Metastatic hepatocellular carcinoma: CT angio chest/abdomen/pelvis with progressive disease.  Per oncology note, patient was not interested in further chemotherapy and preferred symptom management.  He is followed by palliative medicine.  -Added oncology to treatment team   IDDM-2 with hyperglycemia: A1c 8.4% (4.8% in 10/2022).  Since he is on Lantus, Jardiance and metformin at home Recent Labs  Lab 04/22/23 1637 04/22/23 2024 04/23/23 0452 04/23/23 0723 04/23/23 1111  GLUCAP 157* 204* 150* 147* 120*  -Continue SSI-sensitive   Nausea and diarrhea: Likely due to underlying malignancy and sepsis.  Abdominal exam benign except for mild left-sided tenderness.  No radiologic evidence of colitis.  Seems to have resolved.  Was unable to give stool sample for C. difficile testing.  Symptoms resolved. -Antiemetics as needed  Normocytic anemia: Noted some drop in Hgb likely dilutional from IV fluid.  No signs of overt bleeding Recent Labs    11/25/22 0436 12/16/22 1242 01/13/23 1422 02/24/23 1310 03/10/23 2235 03/11/23 1830 04/21/23 1359 04/21/23 1600 04/22/23 0503 04/23/23 0507  HGB 12.9* 12.7*  12.4* 11.4* 12.1* 11.5* 10.4* 11.1* 9.1* 9.2*  -Continue monitoring  Essential hypertension: Normotensive. -Resume home Coreg. -Discontinue amlodipine.  Continue holding lisinopril. -IV labetalol as needed   Thrombocytopenia: Acute on chronic.  Likely due to sepsis.  Expect improvement with treatment -Continue monitoring   Generalized weakness -PT/OT-no need identified.  Leukopenia/leukocytosis: Resolved.   Non-anion gap metabolic acidosis: Due to GI loss and IVF.  Resolved.  Hyponatremia: Resolved.  Increased nutrient needs Body mass index is 19.89 kg/m. -Consult dietitian          DVT prophylaxis:  enoxaparin (LOVENOX) injection 40 mg Start: 04/21/23 2200  Code Status: Full code Family Communication: Updated patient's wife at bedside Level of care: Med-Surg Status is: Inpatient Remains inpatient appropriate because: Severe sepsis due to gram-negative bacteremia   Final disposition: Home Consultants:  Oncology   55 minutes with more than 50% spent in reviewing records, counseling patient/family and coordinating care.   Sch Meds:  Scheduled Meds:  carvedilol  6.25 mg Oral BID WC   enoxaparin (LOVENOX) injection  40 mg Subcutaneous Q24H   insulin aspart  0-5 Units Subcutaneous QHS   insulin aspart  0-9 Units Subcutaneous TID WC   pantoprazole  40 mg Oral BID AC   sodium bicarbonate  650 mg Oral BID   Continuous Infusions:  ceFEPime (MAXIPIME) IV Stopped (04/23/23 0315)   PRN Meds:.acetaminophen **OR** acetaminophen, labetalol, ondansetron **OR** ondansetron (ZOFRAN) IV, oxyCODONE  Antimicrobials: Anti-infectives (From admission, onward)    Start     Dose/Rate Route Frequency Ordered Stop   04/22/23 2000  vancomycin (VANCOREADY) IVPB 1250 mg/250 mL  Status:  Discontinued        1,250 mg 166.7 mL/hr over 90 Minutes Intravenous Every 24 hours 04/21/23 2034 04/22/23 0942   04/22/23 0500  metroNIDAZOLE (FLAGYL) IVPB 500 mg  Status:  Discontinued         500 mg 100 mL/hr over 60 Minutes Intravenous Every 12 hours 04/21/23 1949 04/22/23 0942   04/22/23 0400  ceFEPIme (MAXIPIME) 2 g in sodium chloride 0.9 % 100 mL IVPB        2 g 200 mL/hr over 30 Minutes Intravenous Every 12 hours 04/21/23 2023     04/21/23 1630  Vancomycin (VANCOCIN) 1,500 mg in sodium chloride 0.9 % 500 mL IVPB        1,500 mg 250 mL/hr over 120 Minutes Intravenous  Once 04/21/23 1617 04/21/23 2023   04/21/23 1615  ceFEPIme (MAXIPIME) 2 g in sodium chloride 0.9 % 100 mL IVPB        2 g 200 mL/hr over 30 Minutes Intravenous  Once 04/21/23 1613 04/21/23 1648   04/21/23 1615  metroNIDAZOLE (FLAGYL) IVPB 500 mg        500 mg 100 mL/hr over 60 Minutes Intravenous  Once 04/21/23 1613 04/21/23 1823   04/21/23 1615  vancomycin (VANCOCIN) IVPB 1000 mg/200 mL premix  Status:  Discontinued        1,000 mg 200 mL/hr over 60 Minutes Intravenous  Once 04/21/23 1613 04/21/23 1617        I have personally reviewed the following labs and images: CBC: Recent Labs  Lab 04/21/23 1359 04/21/23 1600 04/22/23 0503 04/23/23 0507  WBC  6.3 2.5* 13.5* 8.7  NEUTROABS 5.0 2.2  --   --   HGB 10.4* 11.1* 9.1* 9.2*  HCT 31.2* 33.5* 27.9* 28.0*  MCV 92.6 94.6 97.2 95.6  PLT 114* 104* 87* 79*   BMP &GFR Recent Labs  Lab 04/21/23 1600 04/22/23 0503 04/23/23 0507  NA 137 131* 137  K 3.9 4.3 3.6  CL 104 106 108  CO2 20* 18* 22  GLUCOSE 186* 281* 161*  BUN 19 19 23   CREATININE 1.22 1.24 1.18  CALCIUM 9.5 7.9* 8.2*  MG  --   --  1.7   Estimated Creatinine Clearance: 54.1 mL/min (by C-G formula based on SCr of 1.18 mg/dL). Liver & Pancreas: Recent Labs  Lab 04/21/23 1600 04/22/23 0503 04/23/23 0507  AST 97* 80* 90*  ALT 34 28 31  ALKPHOS 281* 190* 183*  BILITOT 1.3* 0.8 0.7  PROT 8.4* 6.4* 6.9  ALBUMIN 2.6* 2.0* 2.2*   Recent Labs  Lab 04/21/23 1600  LIPASE 27   No results for input(s): "AMMONIA" in the last 168 hours. Diabetic: Recent Labs    04/21/23 1600   HGBA1C 8.4*   Recent Labs  Lab 04/22/23 1637 04/22/23 2024 04/23/23 0452 04/23/23 0723 04/23/23 1111  GLUCAP 157* 204* 150* 147* 120*   Cardiac Enzymes: No results for input(s): "CKTOTAL", "CKMB", "CKMBINDEX", "TROPONINI" in the last 168 hours. No results for input(s): "PROBNP" in the last 8760 hours. Coagulation Profile: Recent Labs  Lab 04/21/23 1600  INR 1.2   Thyroid Function Tests: No results for input(s): "TSH", "T4TOTAL", "FREET4", "T3FREE", "THYROIDAB" in the last 72 hours. Lipid Profile: No results for input(s): "CHOL", "HDL", "LDLCALC", "TRIG", "CHOLHDL", "LDLDIRECT" in the last 72 hours. Anemia Panel: Recent Labs    04/21/23 1343  FERRITIN 118   Urine analysis:    Component Value Date/Time   COLORURINE YELLOW 04/21/2023 1729   APPEARANCEUR CLEAR 04/21/2023 1729   LABSPEC 1.011 04/21/2023 1729   PHURINE 5.0 04/21/2023 1729   GLUCOSEU NEGATIVE 04/21/2023 1729   HGBUR SMALL (A) 04/21/2023 1729   BILIRUBINUR NEGATIVE 04/21/2023 1729   KETONESUR NEGATIVE 04/21/2023 1729   PROTEINUR NEGATIVE 04/21/2023 1729   NITRITE NEGATIVE 04/21/2023 1729   LEUKOCYTESUR NEGATIVE 04/21/2023 1729   Sepsis Labs: Invalid input(s): "PROCALCITONIN", "LACTICIDVEN"  Microbiology: Recent Results (from the past 240 hours)  Culture, blood (routine x 2)     Status: Abnormal (Preliminary result)   Collection Time: 04/21/23  4:00 PM   Specimen: BLOOD  Result Value Ref Range Status   Specimen Description   Final    BLOOD LEFT ANTECUBITAL Performed at [facility], [address], [place], [place] [address]    Special Requests   Final    BOTTLES DRAWN AEROBIC AND ANAEROBIC Blood Culture adequate volume Performed at [facility], [address], [place], [place] [address]    Culture  Setup Time   Final    GRAM NEGATIVE RODS IN BOTH AEROBIC AND ANAEROBIC BOTTLES Organism ID to follow CRITICAL RESULT CALLED TO, READ BACK BY AND VERIFIED  WITH: A. PHAM PHARMD, AT [id number] 04/22/23 D.VANHOOK    Culture (A)  Final    KLEBSIELLA ORNITHINOLYTICA SUSCEPTIBILITIES TO FOLLOW Performed at [facility], [address] [place], [place] [address]    Report Status PENDING  Incomplete  Resp panel by RT-PCR (RSV, Flu A&B, Covid) Anterior Nasal Swab     Status: None   Collection Time: 04/21/23  4:00 PM   Specimen: Anterior Nasal  Swab  Result Value Ref Range Status   SARS Coronavirus 2 by RT PCR NEGATIVE NEGATIVE Final    Comment: (NOTE) SARS-CoV-2 target nucleic acids are NOT DETECTED.  The SARS-CoV-2 RNA is generally detectable in upper respiratory specimens during the acute phase of infection. The lowest concentration of SARS-CoV-2 viral copies this assay can detect is 138 copies/mL. A negative result does not preclude SARS-Cov-2 infection and should not be used as the sole basis for treatment or other patient management decisions. A negative result may occur with  improper specimen collection/handling, submission of specimen other than nasopharyngeal swab, presence of viral mutation(s) within the areas targeted by this assay, and inadequate number of viral copies(<138 copies/mL). A negative result must be combined with clinical observations, patient history, and epidemiological information. The expected result is Negative.  Fact Sheet for Patients:  BloggerCourse.com  Fact Sheet for Healthcare Providers:  SeriousBroker.it  This test is no t yet approved or cleared by the [place] FDA and  has been authorized for detection and/or diagnosis of SARS-CoV-2 by FDA under an Emergency Use Authorization (EUA). This EUA will remain  in effect (meaning this test can be used) for the duration of the COVID-19 declaration under Section 564(b)(1) of the Act, 21 U.S.C.section 360bbb-3(b)(1), unless the authorization is terminated  or revoked sooner.       Influenza A by PCR  NEGATIVE NEGATIVE Final   Influenza B by PCR NEGATIVE NEGATIVE Final    Comment: (NOTE) The Xpert Xpress SARS-CoV-2/FLU/RSV plus assay is intended as an aid in the diagnosis of influenza from Nasopharyngeal swab specimens and should not be used as a sole basis for treatment. Nasal washings and aspirates are unacceptable for Xpert Xpress SARS-CoV-2/FLU/RSV testing.  Fact Sheet for Patients: BloggerCourse.com  Fact Sheet for Healthcare Providers: SeriousBroker.it  This test is not yet approved or cleared by the [place] FDA and has been authorized for detection and/or diagnosis of SARS-CoV-2 by FDA under an Emergency Use Authorization (EUA). This EUA will remain in effect (meaning this test can be used) for the duration of the COVID-19 declaration under Section 564(b)(1) of the Act, 21 U.S.C. section 360bbb-3(b)(1), unless the authorization is terminated or revoked.     Resp Syncytial Virus by PCR NEGATIVE NEGATIVE Final    Comment: (NOTE) Fact Sheet for Patients: BloggerCourse.com  Fact Sheet for Healthcare Providers: SeriousBroker.it  This test is not yet approved or cleared by the [place] FDA and has been authorized for detection and/or diagnosis of SARS-CoV-2 by FDA under an Emergency Use Authorization (EUA). This EUA will remain in effect (meaning this test can be used) for the duration of the COVID-19 declaration under Section 564(b)(1) of the Act, 21 U.S.C. section 360bbb-3(b)(1), unless the authorization is terminated or revoked.  Performed at [facility], [address], [place], [place] [address]   Blood Culture ID Panel (Reflexed)     Status: Abnormal   Collection Time: 04/21/23  4:00 PM  Result Value Ref Range Status   Enterococcus faecalis NOT DETECTED NOT DETECTED Final   Enterococcus Faecium NOT DETECTED NOT DETECTED Final    Listeria monocytogenes NOT DETECTED NOT DETECTED Final   Staphylococcus species NOT DETECTED NOT DETECTED Final   Staphylococcus aureus (BCID) NOT DETECTED NOT DETECTED Final   Staphylococcus epidermidis NOT DETECTED NOT DETECTED Final   Staphylococcus lugdunensis NOT DETECTED NOT DETECTED Final   Streptococcus species NOT DETECTED NOT DETECTED Final   Streptococcus agalactiae NOT DETECTED NOT DETECTED Final  Streptococcus pneumoniae NOT DETECTED NOT DETECTED Final   Streptococcus pyogenes NOT DETECTED NOT DETECTED Final   A.calcoaceticus-baumannii NOT DETECTED NOT DETECTED Final   Bacteroides fragilis NOT DETECTED NOT DETECTED Final   Enterobacterales DETECTED (A) NOT DETECTED Final    Comment: Enterobacterales represent a large order of gram negative bacteria, not a single organism. Refer to culture for further identification. CRITICAL RESULT CALLED TO, READ BACK BY AND VERIFIED WITH: A. PHAM PHARMD, AT [id number] 04/22/23 D.VANHOOK    Enterobacter cloacae complex NOT DETECTED NOT DETECTED Final   Escherichia coli NOT DETECTED NOT DETECTED Final   Klebsiella aerogenes NOT DETECTED NOT DETECTED Final   Klebsiella oxytoca NOT DETECTED NOT DETECTED Final   Klebsiella pneumoniae NOT DETECTED NOT DETECTED Final   Proteus species NOT DETECTED NOT DETECTED Final   Salmonella species NOT DETECTED NOT DETECTED Final   Serratia marcescens NOT DETECTED NOT DETECTED Final   Haemophilus influenzae NOT DETECTED NOT DETECTED Final   Neisseria meningitidis NOT DETECTED NOT DETECTED Final   Pseudomonas aeruginosa NOT DETECTED NOT DETECTED Final   Stenotrophomonas maltophilia NOT DETECTED NOT DETECTED Final   Candida albicans NOT DETECTED NOT DETECTED Final   Candida auris NOT DETECTED NOT DETECTED Final   Candida glabrata NOT DETECTED NOT DETECTED Final   Candida krusei NOT DETECTED NOT DETECTED Final   Candida parapsilosis NOT DETECTED NOT DETECTED Final   Candida tropicalis NOT DETECTED NOT DETECTED  Final   Cryptococcus neoformans/gattii NOT DETECTED NOT DETECTED Final   CTX-M ESBL NOT DETECTED NOT DETECTED Final   Carbapenem resistance IMP NOT DETECTED NOT DETECTED Final   Carbapenem resistance KPC NOT DETECTED NOT DETECTED Final   Carbapenem resistance NDM NOT DETECTED NOT DETECTED Final   Carbapenem resist OXA 48 LIKE NOT DETECTED NOT DETECTED Final   Carbapenem resistance VIM NOT DETECTED NOT DETECTED Final    Comment: Performed at [facility], [address], [place], [place] [address]  Urine Culture     Status: None   Collection Time: 04/21/23  5:29 PM   Specimen: Urine, Clean Catch  Result Value Ref Range Status   Specimen Description   Final    URINE, CLEAN CATCH Performed at [facility], [address], [place], [place] [address]    Special Requests   Final    NONE Performed at [facility], [address] [place], [place] [address]    Culture   Final    NO GROWTH Performed at [facility], [address] [place], [place] [address]    Report Status 04/23/2023 FINAL  Final    Radiology Studies: No results found.    Anurag Scarfo T. Brittiny Levitz [facility]  If 7PM-7AM, please contact night-coverage [email or web address] 04/23/2023, 1:21 PM
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
## 2023-04-24 ENCOUNTER — Encounter: Payer: Self-pay | Admitting: Hematology

## 2023-04-24 ENCOUNTER — Other Ambulatory Visit (HOSPITAL_COMMUNITY): Payer: Self-pay

## 2023-04-24 DIAGNOSIS — C220 Liver cell carcinoma: Secondary | ICD-10-CM | POA: Diagnosis not present

## 2023-04-24 DIAGNOSIS — B961 Klebsiella pneumoniae [K. pneumoniae] as the cause of diseases classified elsewhere: Secondary | ICD-10-CM

## 2023-04-24 DIAGNOSIS — R7881 Bacteremia: Secondary | ICD-10-CM | POA: Diagnosis not present

## 2023-04-24 DIAGNOSIS — D696 Thrombocytopenia, unspecified: Secondary | ICD-10-CM | POA: Diagnosis not present

## 2023-04-24 DIAGNOSIS — E119 Type 2 diabetes mellitus without complications: Secondary | ICD-10-CM | POA: Diagnosis not present

## 2023-04-24 DIAGNOSIS — A419 Sepsis, unspecified organism: Secondary | ICD-10-CM | POA: Diagnosis not present

## 2023-04-24 LAB — CULTURE, BLOOD (ROUTINE X 2): Special Requests: ADEQUATE

## 2023-04-24 LAB — GLUCOSE, CAPILLARY
Glucose-Capillary: 134 mg/dL — ABNORMAL HIGH (ref 70–99)
Glucose-Capillary: 209 mg/dL — ABNORMAL HIGH (ref 70–99)

## 2023-04-24 MED ORDER — AMOXICILLIN-POT CLAVULANATE 875-125 MG PO TABS
1.0000 | ORAL_TABLET | Freq: Two times a day (BID) | ORAL | 0 refills | Status: DC
  Filled 2023-04-24: qty 12, 6d supply, fill #0

## 2023-04-24 MED ORDER — AMOXICILLIN-POT CLAVULANATE 875-125 MG PO TABS
1.0000 | ORAL_TABLET | Freq: Two times a day (BID) | ORAL | Status: DC
  Administered 2023-04-24: 1 via ORAL
  Filled 2023-04-24: qty 1

## 2023-04-24 MED ORDER — AMOXICILLIN-POT CLAVULANATE 875-125 MG PO TABS
1.0000 | ORAL_TABLET | Freq: Two times a day (BID) | ORAL | 0 refills | Status: AC
  Filled 2023-04-24: qty 20, 10d supply, fill #0

## 2023-04-24 NOTE — TOC Transition Note (Signed)
Transition of Care [facility]) - Discharge Note   Patient Details  Name: Troy [name] MRN: [id number] Date of Birth: [birth year]  Transition of Care [facility]) CM/SW Contact:  Lanier Clam, RN Phone Number: 04/24/2023, 10:37 AM   Clinical Narrative: [name] spouse will continue to follow with Cancer Center for concerns if needed in regard to palliative care. No further CM needs.      Final next level of care: Home/Self Care Barriers to Discharge: No Barriers Identified   Patient Goals and CMS Choice Patient states their goals for this hospitalization and ongoing recovery are:: Home CMS Medicare.gov Compare Post Acute Care list provided to:: Patient Represenative (must comment) ([name](spouse))   [place] ownership interest in [facility].provided to:: Spouse    Discharge Placement                       Discharge Plan and Services Additional resources added to the After Visit Summary for     Discharge Planning Services: CM Consult Post Acute Care Choice: Resumption of Svcs/PTA Provider                               Social Drivers of Health (SDOH) Interventions SDOH Screenings   Food Insecurity: No Food Insecurity (04/21/2023)  Housing: Low Risk  (04/23/2023)  Transportation Needs: No Transportation Needs (04/21/2023)  Utilities: Not At Risk (04/21/2023)  Tobacco Use: Medium Risk (04/21/2023)     Readmission Risk Interventions     No data to display

## 2023-04-24 NOTE — Care Management Important Message (Signed)
Important Message  Patient Details IM Letters given. Name: Troy [name] MRN: [id number] Date of Birth: 04/13/1956   Important Message Given:  Yes - Medicare and Tricare IM     Caren Macadam 04/24/2023, 10:36 AM

## 2023-04-24 NOTE — Plan of Care (Signed)
Problem: Education: Goal: Ability to describe self-care measures that may prevent or decrease complications (Diabetes Survival Skills Education) will improve Outcome: Adequate for Discharge Goal: Individualized Educational Video(s) Outcome: Adequate for Discharge   Problem: Coping: Goal: Ability to adjust to condition or change in health will improve Outcome: Adequate for Discharge   Problem: Fluid Volume: Goal: Ability to maintain a balanced intake and output will improve Outcome: Adequate for Discharge   Problem: Health Behavior/Discharge Planning: Goal: Ability to identify and utilize available resources and services will improve Outcome: Adequate for Discharge Goal: Ability to manage health-related needs will improve Outcome: Adequate for Discharge   Problem: Metabolic: Goal: Ability to maintain appropriate glucose levels will improve Outcome: Adequate for Discharge   Problem: Nutritional: Goal: Maintenance of adequate nutrition will improve Outcome: Adequate for Discharge Goal: Progress toward achieving an optimal weight will improve Outcome: Adequate for Discharge   Problem: Skin Integrity: Goal: Risk for impaired skin integrity will decrease Outcome: Adequate for Discharge   Problem: Tissue Perfusion: Goal: Adequacy of tissue perfusion will improve Outcome: Adequate for Discharge   Problem: Education: Goal: Knowledge of General Education information will improve Description: Including pain rating scale, medication(s)/side effects and non-pharmacologic comfort measures 04/24/2023 1224 by Loel Ro, RN Outcome: Adequate for Discharge 04/24/2023 2536 by Loel Ro, RN Outcome: Adequate for Discharge   Problem: Health Behavior/Discharge Planning: Goal: Ability to manage health-related needs will improve 04/24/2023 1224 by Loel Ro, RN Outcome: Adequate for Discharge 04/24/2023 6440 by Loel Ro, RN Outcome: Progressing   Problem:  Clinical Measurements: Goal: Ability to maintain clinical measurements within normal limits will improve 04/24/2023 1224 by Loel Ro, RN Outcome: Adequate for Discharge 04/24/2023 3474 by Loel Ro, RN Outcome: Progressing Goal: Will remain free from infection 04/24/2023 1224 by Loel Ro, RN Outcome: Adequate for Discharge 04/24/2023 2595 by Loel Ro, RN Outcome: Progressing Goal: Diagnostic test results will improve 04/24/2023 1224 by Loel Ro, RN Outcome: Adequate for Discharge 04/24/2023 6387 by Loel Ro, RN Outcome: Progressing Goal: Respiratory complications will improve 04/24/2023 1224 by Loel Ro, RN Outcome: Adequate for Discharge 04/24/2023 5643 by Loel Ro, RN Outcome: Adequate for Discharge Goal: Cardiovascular complication will be avoided 04/24/2023 1224 by Loel Ro, RN Outcome: Adequate for Discharge 04/24/2023 3295 by Loel Ro, RN Outcome: Adequate for Discharge   Problem: Activity: Goal: Risk for activity intolerance will decrease 04/24/2023 1224 by Loel Ro, RN Outcome: Adequate for Discharge 04/24/2023 1884 by Loel Ro, RN Outcome: Adequate for Discharge   Problem: Nutrition: Goal: Adequate nutrition will be maintained 04/24/2023 1224 by Loel Ro, RN Outcome: Adequate for Discharge 04/24/2023 0821 by Loel Ro, RN Outcome: Adequate for Discharge   Problem: Coping: Goal: Level of anxiety will decrease 04/24/2023 1224 by Loel Ro, RN Outcome: Adequate for Discharge 04/24/2023 1660 by Loel Ro, RN Outcome: Adequate for Discharge   Problem: Elimination: Goal: Will not experience complications related to bowel motility 04/24/2023 1224 by Loel Ro, RN Outcome: Adequate for Discharge 04/24/2023 6301 by Loel Ro, RN Outcome: Adequate for Discharge Goal: Will not experience complications related to urinary retention 04/24/2023 1224  by Loel Ro, RN Outcome: Adequate for Discharge 04/24/2023 6010 by Loel Ro, RN Outcome: Adequate for Discharge   Problem: Pain Management: Goal: General experience of comfort will improve 04/24/2023 1224 by Loel Ro, RN Outcome: Adequate for Discharge 04/24/2023 9323 by Loel Ro,  RN Outcome: Adequate for Discharge   Problem: Safety: Goal: Ability to remain free from injury will improve 04/24/2023 1224 by Loel Ro, RN Outcome: Adequate for Discharge 04/24/2023 0454 by Loel Ro, RN Outcome: Adequate for Discharge   Problem: Skin Integrity: Goal: Risk for impaired skin integrity will decrease 04/24/2023 1224 by Loel Ro, RN Outcome: Adequate for Discharge 04/24/2023 0981 by Loel Ro, RN Outcome: Adequate for Discharge

## 2023-04-24 NOTE — Plan of Care (Signed)
  Problem: Education: Goal: Knowledge of General Education information will improve Description: Including pain rating scale, medication(s)/side effects and non-pharmacologic comfort measures Outcome: Adequate for Discharge   Problem: Health Behavior/Discharge Planning: Goal: Ability to manage health-related needs will improve Outcome: Progressing   Problem: Clinical Measurements: Goal: Ability to maintain clinical measurements within normal limits will improve Outcome: Progressing Goal: Will remain free from infection Outcome: Progressing Goal: Diagnostic test results will improve Outcome: Progressing Goal: Respiratory complications will improve Outcome: Adequate for Discharge Goal: Cardiovascular complication will be avoided Outcome: Adequate for Discharge   Problem: Activity: Goal: Risk for activity intolerance will decrease Outcome: Adequate for Discharge   Problem: Nutrition: Goal: Adequate nutrition will be maintained Outcome: Adequate for Discharge   Problem: Coping: Goal: Level of anxiety will decrease Outcome: Adequate for Discharge   Problem: Elimination: Goal: Will not experience complications related to bowel motility Outcome: Adequate for Discharge Goal: Will not experience complications related to urinary retention Outcome: Adequate for Discharge   Problem: Pain Management: Goal: General experience of comfort will improve Outcome: Adequate for Discharge   Problem: Safety: Goal: Ability to remain free from injury will improve Outcome: Adequate for Discharge   Problem: Skin Integrity: Goal: Risk for impaired skin integrity will decrease Outcome: Adequate for Discharge

## 2023-04-24 NOTE — Progress Notes (Signed)
Discharge med delivered to the pt room by this RN- primary RN updated
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
## 2023-04-24 NOTE — Plan of Care (Signed)
  Problem: Fluid Volume: Goal: Ability to maintain a balanced intake and output will improve Outcome: Progressing   Problem: Clinical Measurements: Goal: Diagnostic test results will improve Outcome: Progressing Goal: Respiratory complications will improve Outcome: Progressing Goal: Cardiovascular complication will be avoided Outcome: Progressing   Problem: Safety: Goal: Ability to remain free from injury will improve Outcome: Progressing   Problem: Skin Integrity: Goal: Risk for impaired skin integrity will decrease Outcome: Progressing

## 2023-04-24 NOTE — Discharge Summary (Signed)
Physician Discharge Summary  Troy [name] JYN:[id number] DOB: March 06, 1956 DOA: 04/21/2023  PCP: Troy Mood, MD  Admit date: 04/21/2023 Discharge date: 04/24/2023 12:40 PM  Admitted From: Home Disposition: Home Recommendations for Outpatient Follow-up:  Follow up with PCP in 1 week Outpatient follow-up with oncology and palliative medicine as previously planned ID recommended port removal since patient is not interested in further chemotherapy/cancer treatment. Discontinued lisinopril.  Reassess blood pressure, CMP and CBC at follow-up. Please follow up on the following pending results: None  Home Health: Not indicated Equipment/Devices: Not indicated  Discharge Condition: Stable CODE STATUS: Full code  Follow-up Information     Troy Mood, MD. Schedule an appointment as soon as possible for a visit in 1 week(s).   Specialties: Hematology, Oncology Contact information: [address] [place] [address] [phone]                 Hospital course 67 y.o. male with PMH of metastatic hepatocellular carcinoma, IDDM-2, HTN, chronic back pain, GI bleed/GAVE, HTN and thrombocytopenia sent to ED from oncology office due to chills, rigors, nausea and episode of diarrhea, and admitted with working diagnosis of SIRS, lactic acidosis, nausea and diarrhea.   Patient was at oncology office and has his port flushed after which he felt nauseous, chills and rigors.  He also had an episode of diarrhea.  He was brought to ED. workup in ED significant for lactic acidosis, leukopenia with lymphopenia and CT abdomen pelvis with evidence of progressive hepatocellular carcinoma with metastasis.  Cultures obtained.  Patient was started on broad-spectrum antibiotics due to concern for severe sepsis.   The next day, blood culture with Klebsiella ornithinolytica.  Antibiotic de-escalated to IV cefepime and ID consulted.  Patient continued to feel well.  Symptoms resolved.  Klebsiella  pansensitive except to ampicillin.  Patient is discharged on p.o. Augmentin for 10 more days to complete a total of 2 weeks per ID.  ID recommended port removal since patient is not interested in further chemotherapy.   Patient was evaluated by palliative while in house, and they recommended outpatient follow-up.  See individual problem list below for more.   Problems addressed during this hospitalization Severe sepsis due to gram-negative bacteremia: POA.  Had tachycardia, leukopenia with lactic acidosis.  Presented with chills, rigors, nausea and diarrhea.  Received 2 L of IV fluid bolus in ED and continued on maintenance. CT angio chest, abdomen and pelvis concerning for progressive hepatocellular carcinoma with metastasis.  Urinalysis and urine culture negative.  Blood culture with pansensitive Klebsiella ornithinolytica except to ampicillin.  Source felt to be GI tract.  Discharged on 10 days of p.o. Augmentin to complete a total of 2 weeks per ID.  ID also recommended for removal since patient is not interested in chemotherapy.   Metastatic hepatocellular carcinoma: CT angio chest/abdomen/pelvis with progressive disease.  Per oncology note, patient was not interested in further chemotherapy and preferred symptom management.  He is followed by palliative medicine.  Outpatient follow-up with oncology.   IDDM-2 with hyperglycemia: A1c 8.4% (4.8% in 10/2022).  Since he is on Lantus, Jardiance and metformin at home -Continue home meds.   Nausea and diarrhea: Likely due to underlying malignancy and sepsis.  Abdominal exam benign except for mild left-sided tenderness.  No radiologic evidence of colitis.  Seems to have resolved.  Was unable to give stool sample for C. difficile testing.  Symptoms resolved.   Normocytic anemia: H&H stable after initial drop likely from IV fluid.  No  overt bleeding. -Recheck CBC outpatient  Essential hypertension: Normotensive off home lisinopril. -Continue home Coreg  and amlodipine. -Discontinue lisinopril.   Thrombocytopenia: Acute on chronic.  Likely due to sepsis.  Expect improvement with treatment -Recheck CBC at follow-up   Generalized weakness -PT/OT-no need identified.  Right inguinal hernia/hydrocele: Patient has recurrent right scrotal swelling.  Nontender.  No erythema.  Resolves with lying flat. -Outpatient follow-up   Leukopenia/leukocytosis: Resolved.    Non-anion gap metabolic acidosis: Due to GI loss and IVF.  Resolved.   Hyponatremia: Resolved.              Time spent 35 minutes  Vital signs Vitals:   04/23/23 1211 04/23/23 1658 04/23/23 2105 04/24/23 0614  BP: 129/87 (!) 151/85 129/77 (!) 142/95  Pulse: 81 79 78 73  Temp: 98.5 F (36.9 C)  98.5 F (36.9 C) 98.7 F (37.1 C)  Resp: 20  15 15   Height:      Weight:      SpO2: 100%  98% 98%  TempSrc: Oral  Oral Oral  BMI (Calculated):         Discharge exam  GENERAL: No apparent distress.  Nontoxic. HEENT: MMM.  Vision and hearing grossly intact.  NECK: Supple.  No apparent JVD.  RESP:  No IWOB.  Fair aeration bilaterally. CVS:  RRR. Heart sounds normal.  ABD/GI/GU: BS+. Abd soft, NTND.  MSK/EXT:  Moves extremities. No apparent deformity. No edema.  SKIN: no apparent skin lesion or wound NEURO: Awake and alert. Oriented appropriately.  No apparent focal neuro deficit. PSYCH: Calm. Normal affect.   Discharge Instructions Discharge Instructions     Diet - low sodium heart healthy   Complete by: As directed    Diet Carb Modified   Complete by: As directed    Discharge instructions   Complete by: As directed    It has been a pleasure taking care of you!  You were hospitalized due to bacteremia (blood infection) for which you have been started on antibiotics.  Your symptoms improved.  We are discharging you on more antibiotics to complete treatment course.  It is very important that you complete the whole course of antibiotics regardless of improvement.   Follow-up with your primary care doctor in 1 to 2 weeks or sooner if needed.  Follow-up with your oncologist as previously planned.   Take care,   Increase activity slowly   Complete by: As directed       Allergies as of 04/24/2023   No Known Allergies      Medication List     STOP taking these medications    HYDROcodone-acetaminophen 5-325 MG tablet Commonly known as: Norco   insulin glargine 100 UNIT/ML injection Commonly known as: LANTUS   lisinopril 20 MG tablet Commonly known as: ZESTRIL   lisinopril 40 MG tablet Commonly known as: ZESTRIL   methocarbamol 500 MG tablet Commonly known as: ROBAXIN       TAKE these medications    acetaminophen 500 MG tablet Commonly known as: TYLENOL Take 500 mg by mouth every 6 (six) hours as needed for mild pain (pain score 1-3), moderate pain (pain score 4-6) or headache.   amLODipine 10 MG tablet Commonly known as: NORVASC Take 10 mg by mouth daily as needed (for an elevated B/P). What changed: Another medication with the same name was removed. Continue taking this medication, and follow the directions you see here.   amoxicillin-clavulanate 875-125 MG tablet Commonly known as: AUGMENTIN Take 1 tablet  by mouth 2 (two) times daily for 10 days.   carboxymethylcellulose 0.5 % Soln Commonly known as: REFRESH PLUS Place 1 drop into both eyes 4 (four) times daily as needed (dry eyes).   carvedilol 6.25 MG tablet Commonly known as: Coreg Take 1 tablet (6.25 mg total) by mouth daily.   empagliflozin 25 MG Tabs tablet Commonly known as: JARDIANCE Take 25 mg by mouth daily.   feeding supplement Liqd Take 237 mLs by mouth 2 (two) times daily between meals.   metFORMIN 850 MG tablet Commonly known as: GLUCOPHAGE Take 850 mg by mouth 2 (two) times daily.   morphine 15 MG 12 hr tablet Commonly known as: MS CONTIN Take 15 mg by mouth every 12 (twelve) hours as needed for pain.   ondansetron 8 MG tablet Commonly known  as: ZOFRAN Take 1 tablet (8 mg total) by mouth every 8 (eight) hours as needed for nausea or vomiting.   pantoprazole 40 MG tablet Commonly known as: PROTONIX Take 1 tablet (40 mg total) by mouth 2 (two) times daily before a meal. What changed:  when to take this reasons to take this   promethazine 12.5 MG tablet Commonly known as: PHENERGAN Take 2 tablets (25 mg total) by mouth every 6 (six) hours as needed for refractory nausea / vomiting.   Semglee (yfgn) 100 UNIT/ML Pen Generic drug: insulin glargine-yfgn Inject 15 Units into the skin daily.        Consultations: Infectious disease Palliative medicine  Procedures/Studies:   CT Angio Chest PE W and/or Wo Contrast Result Date: 04/21/2023 CLINICAL DATA:  Chest abdominal pain, nausea and vomiting, history of metastatic hepatocellular carcinoma. EXAM: CT ANGIOGRAPHY CHEST CT ABDOMEN AND PELVIS WITH CONTRAST TECHNIQUE: Multidetector CT imaging of the chest was performed using the standard protocol during bolus administration of intravenous contrast. Multiplanar CT image reconstructions and MIPs were obtained to evaluate the vascular anatomy. Multidetector CT imaging of the abdomen and pelvis was performed using the standard protocol during bolus administration of intravenous contrast. RADIATION DOSE REDUCTION: This exam was performed according to the departmental dose-optimization program which includes automated exposure control, adjustment of the mA and/or kV according to patient size and/or use of iterative reconstruction technique. CONTRAST:  OMNIPAQUE IOHEXOL 350 MG/ML SOLN COMPARISON:  03/11/2023, 04/21/2023 FINDINGS: CTA CHEST FINDINGS Cardiovascular: This is a technically adequate evaluation of the pulmonary vasculature. No filling defects or pulmonary emboli. Heart is unremarkable without pericardial effusion. Moderate left atrial dilatation. No evidence of thoracic aortic aneurysm or dissection. Atherosclerosis of the  aorta and coronary vasculature. Mediastinum/Nodes: No enlarged mediastinal, hilar, or axillary lymph nodes. Thyroid gland, trachea, and esophagus demonstrate no significant findings. Large varices surround the distal esophagus and gastroesophageal junction. Lungs/Pleura: Bibasilar scarring and hypoventilatory changes. New subpleural nodule within the left upper lobe abutting the mediastinal margin, measuring 9 cm reference image 69/2. Musculoskeletal: Shrapnel again seen at the left lung base. No acute or destructive bony abnormalities. Reconstructed images demonstrate no additional findings. Review of the MIP images confirms the above findings. CT ABDOMEN and PELVIS FINDINGS Hepatobiliary: Infiltrative heterogeneous mass occupies the entirety of the left lobe liver, measuring up to 6.5 x 9.9 cm reference image 20/4, increased since prior exam consistent with progressive hepatocellular carcinoma. Stable chronic gallbladder wall thickening. Pancreas: Unremarkable. No pancreatic ductal dilatation or surrounding inflammatory changes. Spleen: Normal in size without focal abnormality. Adrenals/Urinary Tract: Kidneys enhance normally and symmetrically. The adrenals are unremarkable. Bladder is moderately distended without focal abnormality. Stomach/Bowel: No bowel obstruction  or ileus. Normal appendix right lower quadrant. There is progressive mural thickening of the gastric antrum and proximal duodenum, now measuring up to 9 mm. Vascular/Lymphatic: Progressive mesenteric and retroperitoneal adenopathy consistent with worsening metastatic disease. Index lymph node masses as follows: Left para-aortic, image 41/4, 6.9 x 5.0 cm. Previously 6.0 x 4.1 cm. Left para-aortic, image 34/4, 3.7 cm.  Previously 3.5 cm. Porta hepatis, image 27/4, 3.1 cm.  Previously 2.6 cm. 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  Stable atherosclerosis of the aorta and its branches. Reproductive: Prostate is unremarkable. Other: No free fluid or free intraperitoneal gas. No abdominal wall hernia. Musculoskeletal: Enlarging soft tissue mass within the right rectus sheath, a measuring up to 2.4 cm reference image 81/4, previously measuring 1.8 cm, consistent with metastatic disease. No acute or destructive bony abnormalities. Reconstructed images demonstrate no additional findings. Review of the MIP images confirms the above findings. IMPRESSION: Chest: 1. No evidence of pulmonary embolus. 2. New subpleural left upper lobe 9 mm pulmonary nodule. Metastatic disease not excluded. Continued attention on follow-up recommended. 3. Aortic Atherosclerosis (ICD10-I70.0). Coronary artery atherosclerosis. Abdomen/pelvis: 1. Enlarging heterogeneous left lobe liver mass consistent with progression of hepatocellular carcinoma. 2. Progressive lymphadenopathy within the porta hepatis, mesentery, and retroperitoneum, consistent with worsening nodal metastases. 3. Enlarging soft tissue mass within the inferior right rectus sheath, consistent with progressive metastatic disease. 4. 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 5. Progressive mural thickening of the gastric antral wall and proximal duodenum, nonspecific. 6. Chronic gallbladder wall thickening without evidence of calcified gallstones. 7.  Aortic Atherosclerosis (ICD10-I70.0). Electronically Signed   By: Sharlet Salina M.D.   On: 04/21/2023 18:28   CT ABDOMEN PELVIS W CONTRAST Result Date: 04/21/2023 CLINICAL DATA:  Chest abdominal pain, nausea and vomiting, history of metastatic hepatocellular carcinoma. EXAM: CT ANGIOGRAPHY CHEST CT ABDOMEN AND PELVIS WITH CONTRAST TECHNIQUE: Multidetector CT imaging of the chest was performed using the standard protocol during bolus administration of intravenous contrast.  Multiplanar CT image reconstructions and MIPs were obtained to evaluate the vascular anatomy. Multidetector CT imaging of the abdomen and pelvis was performed using the standard protocol during bolus administration of intravenous contrast. RADIATION DOSE REDUCTION: This exam was performed according to the departmental dose-optimization program which includes automated exposure control, adjustment of the mA and/or kV according to patient size and/or use of iterative reconstruction technique. CONTRAST:  OMNIPAQUE IOHEXOL 350 MG/ML SOLN COMPARISON:  03/11/2023, 04/21/2023 FINDINGS: CTA CHEST FINDINGS Cardiovascular: This is a technically adequate evaluation of the pulmonary vasculature. No filling defects or pulmonary emboli. Heart is unremarkable without pericardial effusion. Moderate left atrial dilatation. No evidence of thoracic aortic aneurysm or dissection. Atherosclerosis of the aorta and coronary vasculature. Mediastinum/Nodes: No enlarged mediastinal, hilar, or axillary lymph nodes. Thyroid gland, trachea, and esophagus demonstrate no significant findings. Large varices surround the distal esophagus and gastroesophageal junction. Lungs/Pleura: Bibasilar scarring and hypoventilatory changes. New subpleural nodule within the left upper lobe abutting the mediastinal margin, measuring 9 cm reference image 69/2. Musculoskeletal: Shrapnel again seen at the left lung base. No acute or destructive bony abnormalities. Reconstructed images demonstrate no additional findings. Review of the MIP images confirms the above findings. CT ABDOMEN and PELVIS FINDINGS Hepatobiliary: Infiltrative heterogeneous mass occupies the entirety of the left lobe liver, measuring up to 6.5 x 9.9 cm reference image 20/4, increased since prior exam  consistent with progressive hepatocellular carcinoma. Stable chronic gallbladder wall thickening. Pancreas: Unremarkable. No pancreatic ductal dilatation or surrounding inflammatory changes.  Spleen: Normal in size without focal abnormality. Adrenals/Urinary Tract: Kidneys enhance normally and symmetrically. The adrenals are unremarkable. Bladder is moderately distended without focal abnormality. Stomach/Bowel: No bowel obstruction or ileus. Normal appendix right lower quadrant. There is progressive mural thickening of the gastric antrum and proximal duodenum, now measuring up to 9 mm. Vascular/Lymphatic: Progressive mesenteric and retroperitoneal adenopathy consistent with worsening metastatic disease. Index lymph node masses as follows: Left para-aortic, image 41/4, 6.9 x 5.0 cm. Previously 6.0 x 4.1 cm. Left para-aortic, image 34/4, 3.7 cm.  Previously 3.5 cm. Porta hepatis, image 27/4, 3.1 cm.  Previously 2.6 cm. 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 Stable atherosclerosis of the aorta and its branches. Reproductive: Prostate is unremarkable. Other: No free fluid or free intraperitoneal gas. No abdominal wall hernia. Musculoskeletal: Enlarging soft tissue mass within the right rectus sheath, a measuring up to 2.4 cm reference image 81/4, previously measuring 1.8 cm, consistent with metastatic disease. No acute or destructive bony abnormalities. Reconstructed images demonstrate no additional findings. Review of the MIP images confirms the above findings. IMPRESSION: Chest: 1. No evidence of pulmonary embolus. 2. New subpleural left upper lobe 9 mm pulmonary nodule. Metastatic disease not excluded. Continued attention on follow-up recommended. 3. Aortic Atherosclerosis (ICD10-I70.0). Coronary artery atherosclerosis. Abdomen/pelvis: 1. Enlarging heterogeneous left lobe liver mass consistent with progression of hepatocellular carcinoma. 2. Progressive lymphadenopathy within the porta hepatis, mesentery, and retroperitoneum, consistent with worsening nodal metastases. 3. Enlarging soft  tissue mass within the inferior right rectus sheath, consistent with progressive metastatic disease. 4. 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 5. Progressive mural thickening of the gastric antral wall and proximal duodenum, nonspecific. 6. Chronic gallbladder wall thickening without evidence of calcified gallstones. 7.  Aortic Atherosclerosis (ICD10-I70.0). Electronically Signed   By: Sharlet Salina M.D.   On: 04/21/2023 18:28   DG Chest Portable 1 View Result Date: 04/21/2023 CLINICAL DATA:  Chest pain EXAM: PORTABLE CHEST 1 VIEW COMPARISON:  Chest x-ray 11/21/2022 FINDINGS: Underinflation. Minimal basilar atelectasis. No pneumothorax, effusion or edema. Normal cardiopericardial silhouette. Overlapping cardiac leads. Several punctate metallic foci overlie the lower left chest and upper abdomen. Please correlate with history. Right IJ chest port in place with tip overlying the right atrium. Likely calcified right lung base nodule stable. IMPRESSION: Underinflation with slight basilar atelectasis.  Chest port. Possible multiple radiopaque foreign bodies along the lower left chest and upper abdomen. Please correlate with the history Electronically Signed   By: Karen Kays M.D.   On: 04/21/2023 17:52       The results of significant diagnostics from this hospitalization (including imaging, microbiology, ancillary and laboratory) are listed below for reference.     Microbiology: Recent Results (from the past 240 hours)  Culture, blood (routine x 2)     Status: Abnormal   Collection Time: 04/21/23  4:00 PM   Specimen: BLOOD  Result Value Ref Range Status   Specimen Description   Final    BLOOD LEFT ANTECUBITAL Performed at [facility], [address] [place], [place] [address]    Special Requests   Final    BOTTLES DRAWN AEROBIC AND ANAEROBIC Blood Culture adequate volume Performed at [facility], [address]  [place], [place] [address]    Culture  Setup Time   Final    GRAM NEGATIVE RODS IN BOTH AEROBIC AND ANAEROBIC BOTTLES Organism ID to follow CRITICAL RESULT CALLED TO, READ BACK BY AND VERIFIED WITH: A. PHAM PHARMD, AT 0981 04/22/23 D.VANHOOK Performed at [facility], [address] [place], [place] [address]    Culture KLEBSIELLA ORNITHINOLYTICA (A)  Final   Report Status 04/24/2023 FINAL  Final   Organism ID, Bacteria KLEBSIELLA ORNITHINOLYTICA  Final      Susceptibility   Klebsiella ornithinolytica - MIC*    AMPICILLIN RESISTANT Resistant     CEFEPIME <=0.12 SENSITIVE Sensitive     CEFTAZIDIME <=1 SENSITIVE Sensitive     CEFTRIAXONE <=0.25 SENSITIVE Sensitive     CIPROFLOXACIN <=0.25 SENSITIVE Sensitive     GENTAMICIN <=1 SENSITIVE Sensitive     IMIPENEM <=0.25 SENSITIVE Sensitive     TRIMETH/SULFA <=20 SENSITIVE Sensitive     AMPICILLIN/SULBACTAM <=2 SENSITIVE Sensitive     PIP/TAZO <=4 SENSITIVE Sensitive ug/mL    * KLEBSIELLA ORNITHINOLYTICA  Resp panel by RT-PCR (RSV, Flu A&B, Covid) Anterior Nasal Swab     Status: None   Collection Time: 04/21/23  4:00 PM   Specimen: Anterior Nasal Swab  Result Value Ref Range Status   SARS Coronavirus 2 by RT PCR NEGATIVE NEGATIVE Final    Comment: (NOTE) SARS-CoV-2 target nucleic acids are NOT DETECTED.  The SARS-CoV-2 RNA is generally detectable in upper respiratory specimens during the acute phase of infection. The lowest concentration of SARS-CoV-2 viral copies this assay can detect is 138 copies/mL. A negative result does not preclude SARS-Cov-2 infection and should not be used as the sole basis for treatment or other patient management decisions. A negative result may occur with  improper specimen collection/handling, submission of specimen other than nasopharyngeal swab, presence of viral mutation(s) within the areas targeted by this assay, and inadequate number of  viral copies(<138 copies/mL). A negative result must be combined with clinical observations, patient history, and epidemiological information. The expected result is Negative.  Fact Sheet for Patients:  BloggerCourse.com  Fact Sheet for Healthcare Providers:  SeriousBroker.it  This test is no t yet approved or cleared by the [place] FDA and  has been authorized for detection and/or diagnosis of SARS-CoV-2 by FDA under an Emergency Use Authorization (EUA). This EUA will remain  in effect (meaning this test can be used) for the duration of the COVID-19 declaration under Section 564(b)(1) of the Act, 21 U.S.C.section 360bbb-3(b)(1), unless the authorization is terminated  or revoked sooner.       Influenza A by PCR NEGATIVE NEGATIVE Final   Influenza B by PCR NEGATIVE NEGATIVE Final    Comment: (NOTE) The Xpert Xpress SARS-CoV-2/FLU/RSV plus assay is intended as an aid in the diagnosis of influenza from Nasopharyngeal swab specimens and should not be used as a sole basis for treatment. Nasal washings and aspirates are unacceptable for Xpert Xpress SARS-CoV-2/FLU/RSV testing.  Fact Sheet for Patients: BloggerCourse.com  Fact Sheet for Healthcare Providers: SeriousBroker.it  This test is not yet approved or cleared by the [place] FDA and has been authorized for detection and/or diagnosis of SARS-CoV-2 by FDA under an Emergency Use Authorization (EUA). This EUA will remain in effect (meaning this test can be used) for the duration of the COVID-19 declaration under Section 564(b)(1) of the Act, 21 U.S.C. section 360bbb-3(b)(1), unless the authorization is terminated or revoked.     Resp Syncytial Virus by PCR NEGATIVE  NEGATIVE Final    Comment: (NOTE) Fact Sheet for Patients: BloggerCourse.com  Fact Sheet for Healthcare  Providers: SeriousBroker.it  This test is not yet approved or cleared by the [place] FDA and has been authorized for detection and/or diagnosis of SARS-CoV-2 by FDA under an Emergency Use Authorization (EUA). This EUA will remain in effect (meaning this test can be used) for the duration of the COVID-19 declaration under Section 564(b)(1) of the Act, 21 U.S.C. section 360bbb-3(b)(1), unless the authorization is terminated or revoked.  Performed at [facility], [address], [place], [place] [address]   Blood Culture ID Panel (Reflexed)     Status: Abnormal   Collection Time: 04/21/23  4:00 PM  Result Value Ref Range Status   Enterococcus faecalis NOT DETECTED NOT DETECTED Final   Enterococcus Faecium NOT DETECTED NOT DETECTED Final   Listeria monocytogenes NOT DETECTED NOT DETECTED Final   Staphylococcus species NOT DETECTED NOT DETECTED Final   Staphylococcus aureus (BCID) NOT DETECTED NOT DETECTED Final   Staphylococcus epidermidis NOT DETECTED NOT DETECTED Final   Staphylococcus lugdunensis NOT DETECTED NOT DETECTED Final   Streptococcus species NOT DETECTED NOT DETECTED Final   Streptococcus agalactiae NOT DETECTED NOT DETECTED Final   Streptococcus pneumoniae NOT DETECTED NOT DETECTED Final   Streptococcus pyogenes NOT DETECTED NOT DETECTED Final   A.calcoaceticus-baumannii NOT DETECTED NOT DETECTED Final   Bacteroides fragilis NOT DETECTED NOT DETECTED Final   Enterobacterales DETECTED (A) NOT DETECTED Final    Comment: Enterobacterales represent a large order of gram negative bacteria, not a single organism. Refer to culture for further identification. CRITICAL RESULT CALLED TO, READ BACK BY AND VERIFIED WITH: A. PHAM PHARMD, AT 7829 04/22/23 D.VANHOOK    Enterobacter cloacae complex NOT DETECTED NOT DETECTED Final   Escherichia coli NOT DETECTED NOT DETECTED Final   Klebsiella aerogenes NOT DETECTED NOT DETECTED Final    Klebsiella oxytoca NOT DETECTED NOT DETECTED Final   Klebsiella pneumoniae NOT DETECTED NOT DETECTED Final   Proteus species NOT DETECTED NOT DETECTED Final   Salmonella species NOT DETECTED NOT DETECTED Final   Serratia marcescens NOT DETECTED NOT DETECTED Final   Haemophilus influenzae NOT DETECTED NOT DETECTED Final   Neisseria meningitidis NOT DETECTED NOT DETECTED Final   Pseudomonas aeruginosa NOT DETECTED NOT DETECTED Final   Stenotrophomonas maltophilia NOT DETECTED NOT DETECTED Final   Candida albicans NOT DETECTED NOT DETECTED Final   Candida auris NOT DETECTED NOT DETECTED Final   Candida glabrata NOT DETECTED NOT DETECTED Final   Candida krusei NOT DETECTED NOT DETECTED Final   Candida parapsilosis NOT DETECTED NOT DETECTED Final   Candida tropicalis NOT DETECTED NOT DETECTED Final   Cryptococcus neoformans/gattii NOT DETECTED NOT DETECTED Final   CTX-M ESBL NOT DETECTED NOT DETECTED Final   Carbapenem resistance IMP NOT DETECTED NOT DETECTED Final   Carbapenem resistance KPC NOT DETECTED NOT DETECTED Final   Carbapenem resistance NDM NOT DETECTED NOT DETECTED Final   Carbapenem resist OXA 48 LIKE NOT DETECTED NOT DETECTED Final   Carbapenem resistance VIM NOT DETECTED NOT DETECTED Final    Comment: Performed at [facility], [address] [place], [place] [address]  Urine Culture     Status: None   Collection Time: 04/21/23  5:29 PM   Specimen: Urine, Clean Catch  Result Value Ref Range Status   Specimen Description   Final    URINE, CLEAN CATCH Performed at [facility], [address] [place], [place]  [address]    Special Requests   Final    NONE Performed at [facility], [address], [place], [place] [address]    Culture   Final    NO GROWTH Performed at [facility], [address], [place], [place] [address]    Report Status 04/23/2023 FINAL  Final     Labs:  CBC: Recent Labs  Lab  04/21/23 1359 04/21/23 1600 04/22/23 0503 04/23/23 0507  WBC 6.3 2.5* 13.5* 8.7  NEUTROABS 5.0 2.2  --   --   HGB 10.4* 11.1* 9.1* 9.2*  HCT 31.2* 33.5* 27.9* 28.0*  MCV 92.6 94.6 97.2 95.6  PLT 114* 104* 87* 79*   BMP &GFR Recent Labs  Lab 04/21/23 1600 04/22/23 0503 04/23/23 0507  NA 137 131* 137  K 3.9 4.3 3.6  CL 104 106 108  CO2 20* 18* 22  GLUCOSE 186* 281* 161*  BUN 19 19 23   CREATININE 1.22 1.24 1.18  CALCIUM 9.5 7.9* 8.2*  MG  --   --  1.7   Estimated Creatinine Clearance: 54.1 mL/min (by C-G formula based on SCr of 1.18 mg/dL). Liver & Pancreas: Recent Labs  Lab 04/21/23 1600 04/22/23 0503 04/23/23 0507  AST 97* 80* 90*  ALT 34 28 31  ALKPHOS 281* 190* 183*  BILITOT 1.3* 0.8 0.7  PROT 8.4* 6.4* 6.9  ALBUMIN 2.6* 2.0* 2.2*   Recent Labs  Lab 04/21/23 1600  LIPASE 27   No results for input(s): "AMMONIA" in the last 168 hours. Diabetic: Recent Labs    04/21/23 1600  HGBA1C 8.4*   Recent Labs  Lab 04/23/23 1111 04/23/23 1601 04/23/23 1947 04/24/23 0721 04/24/23 1120  GLUCAP 120* 153* 144* 134* 209*   Cardiac Enzymes: No results for input(s): "CKTOTAL", "CKMB", "CKMBINDEX", "TROPONINI" in the last 168 hours. No results for input(s): "PROBNP" in the last 8760 hours. Coagulation Profile: Recent Labs  Lab 04/21/23 1600  INR 1.2   Thyroid Function Tests: No results for input(s): "TSH", "T4TOTAL", "FREET4", "T3FREE", "THYROIDAB" in the last 72 hours. Lipid Profile: No results for input(s): "CHOL", "HDL", "LDLCALC", "TRIG", "CHOLHDL", "LDLDIRECT" in the last 72 hours. Anemia Panel: No results for input(s): "VITAMINB12", "FOLATE", "FERRITIN", "TIBC", "IRON", "RETICCTPCT" in the last 72 hours. Urine analysis:    Component Value Date/Time   COLORURINE YELLOW 04/21/2023 1729   APPEARANCEUR CLEAR 04/21/2023 1729   LABSPEC 1.011 04/21/2023 1729   PHURINE 5.0 04/21/2023 1729   GLUCOSEU NEGATIVE 04/21/2023 1729   HGBUR SMALL (A) 04/21/2023  1729   BILIRUBINUR NEGATIVE 04/21/2023 1729   KETONESUR NEGATIVE 04/21/2023 1729   PROTEINUR NEGATIVE 04/21/2023 1729   NITRITE NEGATIVE 04/21/2023 1729   LEUKOCYTESUR NEGATIVE 04/21/2023 1729   Sepsis Labs: Invalid input(s): "PROCALCITONIN", "LACTICIDVEN"   SIGNED:  Almon Hercules, MD  [facility] 04/24/2023, 3:23 PM
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
## 2023-06-01 NOTE — Assessment & Plan Note (Signed)
cT3N1M0, stage IVA, G3 -history of cirrhosis since ~2011 and hepatitis C diagnosed and treated in 2018 -diagnosed by liver biopsy on 11/04/21, baseline AFP normal  -s/p palliative radiation under Dr. Mitzi Hansen, 7/20-12/17/21 -he began Tecentriq and bevacizumab on 12/06/21. Tolerating well overall. 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 Milford Cage. -Restaging CT scan from April 21, 2022 showed excellent partial response to treatment, reviewed with patient and his wife today. -treatment held in Dec 2023 due to hospital admission for N/V and poor oral intake. Restarted on 05/14/2022 -Restaging CT abdomen pelvis from August 25, 2022 showed mixed response to treatment. Restaging CT on 11/13/2021 showed further disease progression -I changed his treatment to [facility], unfortunately he tolerated poorly and ended up in the hospital with hepatic encephalopathy after first week of Lenvima, his liver function also got worse.  He did recover well after he came off Lenvima -He is on surveillance now. -restaging CT scan from January 27, 2023, which showed stable liver lesions but progression of abdominal lymphadenopathy  -admitted to hospital for bacteriemia on 04/21/2023, CT scan showed metastatic cancer progression

## 2023-06-02 ENCOUNTER — Inpatient Hospital Stay: Payer: No Typology Code available for payment source | Attending: Hematology | Admitting: Hematology

## 2023-06-02 ENCOUNTER — Encounter: Payer: Self-pay | Admitting: Nurse Practitioner

## 2023-06-02 ENCOUNTER — Inpatient Hospital Stay: Payer: No Typology Code available for payment source

## 2023-06-02 ENCOUNTER — Telehealth: Payer: Self-pay | Admitting: Pharmacist

## 2023-06-02 ENCOUNTER — Other Ambulatory Visit (HOSPITAL_COMMUNITY): Payer: Self-pay

## 2023-06-02 ENCOUNTER — Encounter: Payer: Self-pay | Admitting: Hematology

## 2023-06-02 ENCOUNTER — Telehealth: Payer: Self-pay | Admitting: Pharmacy Technician

## 2023-06-02 ENCOUNTER — Telehealth: Payer: Self-pay

## 2023-06-02 ENCOUNTER — Inpatient Hospital Stay (HOSPITAL_BASED_OUTPATIENT_CLINIC_OR_DEPARTMENT_OTHER): Payer: No Typology Code available for payment source | Admitting: Nurse Practitioner

## 2023-06-02 VITALS — BP 156/95 | HR 92 | Temp 98.2°F | Resp 16 | Wt 135.1 lb

## 2023-06-02 DIAGNOSIS — Z8619 Personal history of other infectious and parasitic diseases: Secondary | ICD-10-CM | POA: Diagnosis not present

## 2023-06-02 DIAGNOSIS — B1920 Unspecified viral hepatitis C without hepatic coma: Secondary | ICD-10-CM | POA: Diagnosis not present

## 2023-06-02 DIAGNOSIS — Z79899 Other long term (current) drug therapy: Secondary | ICD-10-CM | POA: Diagnosis not present

## 2023-06-02 DIAGNOSIS — G893 Neoplasm related pain (acute) (chronic): Secondary | ICD-10-CM | POA: Diagnosis not present

## 2023-06-02 DIAGNOSIS — N400 Enlarged prostate without lower urinary tract symptoms: Secondary | ICD-10-CM | POA: Diagnosis not present

## 2023-06-02 DIAGNOSIS — Z515 Encounter for palliative care: Secondary | ICD-10-CM

## 2023-06-02 DIAGNOSIS — C220 Liver cell carcinoma: Secondary | ICD-10-CM

## 2023-06-02 DIAGNOSIS — K5900 Constipation, unspecified: Secondary | ICD-10-CM | POA: Diagnosis not present

## 2023-06-02 DIAGNOSIS — K7682 Hepatic encephalopathy: Secondary | ICD-10-CM | POA: Insufficient documentation

## 2023-06-02 DIAGNOSIS — I81 Portal vein thrombosis: Secondary | ICD-10-CM | POA: Insufficient documentation

## 2023-06-02 DIAGNOSIS — I700 Atherosclerosis of aorta: Secondary | ICD-10-CM | POA: Insufficient documentation

## 2023-06-02 DIAGNOSIS — E1165 Type 2 diabetes mellitus with hyperglycemia: Secondary | ICD-10-CM | POA: Diagnosis not present

## 2023-06-02 DIAGNOSIS — K766 Portal hypertension: Secondary | ICD-10-CM | POA: Insufficient documentation

## 2023-06-02 DIAGNOSIS — R530 Neoplastic (malignant) related fatigue: Secondary | ICD-10-CM | POA: Diagnosis not present

## 2023-06-02 DIAGNOSIS — Z87891 Personal history of nicotine dependence: Secondary | ICD-10-CM | POA: Diagnosis not present

## 2023-06-02 DIAGNOSIS — K828 Other specified diseases of gallbladder: Secondary | ICD-10-CM | POA: Diagnosis not present

## 2023-06-02 DIAGNOSIS — R609 Edema, unspecified: Secondary | ICD-10-CM | POA: Insufficient documentation

## 2023-06-02 DIAGNOSIS — D649 Anemia, unspecified: Secondary | ICD-10-CM

## 2023-06-02 DIAGNOSIS — K449 Diaphragmatic hernia without obstruction or gangrene: Secondary | ICD-10-CM | POA: Insufficient documentation

## 2023-06-02 DIAGNOSIS — Z8505 Personal history of malignant neoplasm of liver: Secondary | ICD-10-CM | POA: Insufficient documentation

## 2023-06-02 DIAGNOSIS — K7469 Other cirrhosis of liver: Secondary | ICD-10-CM | POA: Insufficient documentation

## 2023-06-02 DIAGNOSIS — E860 Dehydration: Secondary | ICD-10-CM

## 2023-06-02 DIAGNOSIS — R590 Localized enlarged lymph nodes: Secondary | ICD-10-CM | POA: Insufficient documentation

## 2023-06-02 LAB — COMPREHENSIVE METABOLIC PANEL
ALT: 21 U/L (ref 0–44)
AST: 64 U/L — ABNORMAL HIGH (ref 15–41)
Albumin: 2.9 g/dL — ABNORMAL LOW (ref 3.5–5.0)
Alkaline Phosphatase: 254 U/L — ABNORMAL HIGH (ref 38–126)
Anion gap: 4 — ABNORMAL LOW (ref 5–15)
BUN: 20 mg/dL (ref 8–23)
CO2: 27 mmol/L (ref 22–32)
Calcium: 9.4 mg/dL (ref 8.9–10.3)
Chloride: 99 mmol/L (ref 98–111)
Creatinine, Ser: 1.02 mg/dL (ref 0.61–1.24)
GFR, Estimated: >60 mL/min (ref >60)
Glucose, Bld: 301 mg/dL — ABNORMAL HIGH (ref 70–99)
Potassium: 4 mmol/L (ref 3.5–5.1)
Sodium: 130 mmol/L — ABNORMAL LOW (ref 135–145)
Total Bilirubin: 1.2 mg/dL (ref 0.0–1.2)
Total Protein: 8.5 g/dL — ABNORMAL HIGH (ref 6.5–8.1)

## 2023-06-02 LAB — CBC WITH DIFFERENTIAL/PLATELET
Abs Immature Granulocytes: 0.03 10*3/uL (ref 0.00–0.07)
Basophils Absolute: 0 10*3/uL (ref 0.0–0.1)
Basophils Relative: 1 %
Eosinophils Absolute: 0.1 10*3/uL (ref 0.0–0.5)
Eosinophils Relative: 1 %
HCT: 29.5 % — ABNORMAL LOW (ref 39.0–52.0)
Hemoglobin: 10.1 g/dL — ABNORMAL LOW (ref 13.0–17.0)
Immature Granulocytes: 1 %
Lymphocytes Relative: 12 %
Lymphs Abs: 0.8 10*3/uL (ref 0.7–4.0)
MCH: 30 pg (ref 26.0–34.0)
MCHC: 34.2 g/dL (ref 30.0–36.0)
MCV: 87.5 fL (ref 80.0–100.0)
Monocytes Absolute: 0.6 10*3/uL (ref 0.1–1.0)
Monocytes Relative: 10 %
Neutro Abs: 4.8 10*3/uL (ref 1.7–7.7)
Neutrophils Relative %: 75 %
Platelets: 114 10*3/uL — ABNORMAL LOW (ref 150–400)
RBC: 3.37 MIL/uL — ABNORMAL LOW (ref 4.22–5.81)
RDW: 14 % (ref 11.5–15.5)
WBC: 6.3 10*3/uL (ref 4.0–10.5)
nRBC: 0 % (ref 0.0–0.2)

## 2023-06-02 LAB — FERRITIN: Ferritin: 165 ng/mL (ref 24–336)

## 2023-06-02 MED ORDER — SORAFENIB TOSYLATE 200 MG PO TABS
200.0000 mg | ORAL_TABLET | Freq: Two times a day (BID) | ORAL | 0 refills | Status: DC
Start: 2023-06-02 — End: 2023-07-29

## 2023-06-02 MED ORDER — AMOXICILLIN-POT CLAVULANATE 875-125 MG PO TABS
1.0000 | ORAL_TABLET | Freq: Two times a day (BID) | ORAL | 0 refills | Status: DC
  Filled 2023-06-02: qty 20, 10d supply, fill #0

## 2023-06-02 MED ORDER — SODIUM CHLORIDE 0.9% FLUSH
10.0000 mL | Freq: Once | INTRAVENOUS | Status: AC
  Administered 2023-06-02: 10 mL

## 2023-06-02 MED ORDER — HEPARIN SOD (PORK) LOCK FLUSH 100 UNIT/ML IV SOLN
500.0000 [IU] | Freq: Once | INTRAVENOUS | Status: AC | PRN
  Administered 2023-06-02: 500 [IU]

## 2023-06-02 MED ORDER — DICYCLOMINE HCL 10 MG PO CAPS
10.0000 mg | ORAL_CAPSULE | Freq: Three times a day (TID) | ORAL | 1 refills | Status: DC
  Filled 2023-06-02: qty 120, 30d supply, fill #0
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
## 2023-06-02 NOTE — Telephone Encounter (Signed)
Oral Oncology Patient Advocate Encounter  Received new rx for patient for Sorafenib. Patient uses the [place] pharmacy in [place], [place]. Rx will be triaged there for processing.  Jinger Neighbors, CPhT-Adv Oncology Pharmacy Patient Advocate [facility] Direct Number: [phone]  Fax: [phone]

## 2023-06-02 NOTE — Telephone Encounter (Signed)
Patient wife called reporting pt c/o chills and "feeling cold" as before when the pt was here last but was refusing to come to the ED like last time. Pt wife unable to get a temp at home but reporting that pt "feels better" after getting under a blanket. Lowella Bandy, NP and [name], MD, notified. Pt spoke with this RN and MD, abx called into pharmacy per MD, strict ED precautions discussed by both RN and MD. Pt wife verbalized understanding of education. No further needs at this time.

## 2023-06-03 LAB — AFP TUMOR MARKER: AFP, Serum, Tumor Marker: 193 ng/mL — ABNORMAL HIGH (ref 0.0–8.4)

## 2023-06-03 NOTE — Telephone Encounter (Signed)
Oral Oncology Pharmacist Encounter  Received new prescription for Nexavar (sorafenib) for the treatment of metastatic hepatocellular carcinoma, planned duration until disease progression or unacceptable drug toxicity.  CBC w/ Diff and CMP from 06/02/23 assessed, no baseline dose adjustments required based on labs. Patient starting on reduced dose of sorafenib per Dr. [name] due to concerns for tolerance. Patient will start on dose escalation of 200 mg daily x 1 week, and then if tolerated increase to 200 mg BID. Prescription dose and frequency assessed for appropriateness.  Current medication list in Epic reviewed, DDIs with Nexavar identified: Category C drug-drug interaction between Nexavar and Pantoprazole - Nexavar's solubility is pH dependent, thus PPIs are likely to decrease efficacy of Nexavar. Will recommend patient hold PPI, if able, while on Nexavar. OK for patient to substitute PPI with H2RA (I.e. famotidine). Will discuss with patient.  Category D drug-drug interaction between Nexavar and APAP - APAP can increase hepatotoxicity of Nexavar, concomitant use is not recommended. Will discuss with patient using alternative to APAP.   Evaluated chart and no patient barriers to medication adherence noted.   Patient agreement for treatment documented in MD note on 06/02/23.  Prescription has been e-scribed to the [facility] pharmacy in [place].  Oral Oncology Clinic will continue to follow for insurance authorization, copayment issues, initial counseling and start date.  Sherry Ruffing, PharmD, BCPS, BCOP Hematology/Oncology Clinical Pharmacist [facility] Oral Chemotherapy Navigation Clinics [phone] 06/03/2023 8:38 AM

## 2023-06-05 NOTE — Telephone Encounter (Signed)
Oral Oncology Patient Advocate Encounter  Made contact with a customer care representative for pharmacy by contacating the [employer] [place] at [phone]. The representative stated the prescription had been received and was being processed. They estimated patient will get medication some time next week. I will call and check in again on Monday.  Jinger Neighbors, CPhT-Adv Oncology Pharmacy Patient Advocate [facility] Direct Number: [phone]  Fax: [phone]

## 2023-06-05 NOTE — Telephone Encounter (Signed)
Oral Oncology Patient Advocate Encounter  Made 2 attempts to contact [facility] pharmacy 06/04/23. System stated it was unable to complete my request and I was unable to speak with a representative.   [facility]ib [facility]. The system repeated the same error as yesterday and I was unable to speak with a representative.  I called and spoke with the patient and his wife and neither of them have heard from the [place] to schedule shipment.  Jinger Neighbors, CPhT-Adv Oncology Pharmacy Patient Advocate [facility] Direct Number: [phone]  Fax: [phone]

## 2023-06-08 ENCOUNTER — Telehealth: Payer: Self-pay | Admitting: Hematology

## 2023-06-08 NOTE — Telephone Encounter (Signed)
Oral Oncology Patient Advocate Encounter  Contacted the [facility], [phone] ext: [phone], to check the status of the order. Per the representative, the order shows in process with the mail delivery side of the pharmacy. There was no shipping information yet.   I confirmed with the representative that no PA was needed.   Jinger Neighbors, CPhT-Adv Oncology Pharmacy Patient Advocate [facility] Direct Number: [phone]  Fax: [phone]

## 2023-06-08 NOTE — Telephone Encounter (Signed)
I called patient, and spoke with his wife, to follow-up his episode of chills last week after office visit.  He had no more chills or fever at home, overall feeling better, he has been taking antibiotics Augmentin I called in last week.  Still has some abdominal pain and cramps.  He is scheduled for port removal on June 17, 2023.  I told his wife to call us back after port removal, if he is ready at that time, we will proceed sorafenib at that time.  She voiced good understanding and agrees with the plan.  Lurena Joiner, please call him later next week for education and get sorafenib filled for him.  [name]  06/08/2023

## 2023-06-10 NOTE — Telephone Encounter (Signed)
Oral Oncology Patient Advocate Encounter  Called and spoke with the [facility] pharmacy. Confirmed that sorafenib has been shipped out as of yesterday, 06/09/23, and is on its way to the patient.  I spoke with the patient's wife to make her aware and advised her to hold onto the medication until she hears from [place] for counseling.  Jinger Neighbors, CPhT-Adv Oncology Pharmacy Patient Advocate [facility] Direct Number: [phone]  Fax: [phone]

## 2023-06-11 ENCOUNTER — Other Ambulatory Visit: Payer: Self-pay

## 2023-06-11 ENCOUNTER — Inpatient Hospital Stay (HOSPITAL_COMMUNITY)
Admission: EM | Admit: 2023-06-11 | Discharge: 2023-06-20 | DRG: 314 | Disposition: A | Discharge status: Home / Self Care | Payer: No Typology Code available for payment source | Attending: Family Medicine | Admitting: Family Medicine

## 2023-06-11 ENCOUNTER — Telehealth: Payer: Self-pay

## 2023-06-11 ENCOUNTER — Emergency Department (HOSPITAL_COMMUNITY): Payer: No Typology Code available for payment source

## 2023-06-11 ENCOUNTER — Inpatient Hospital Stay: Payer: No Typology Code available for payment source

## 2023-06-11 ENCOUNTER — Encounter: Payer: Self-pay | Admitting: Hematology

## 2023-06-11 ENCOUNTER — Encounter (HOSPITAL_COMMUNITY): Payer: Self-pay

## 2023-06-11 ENCOUNTER — Ambulatory Visit (HOSPITAL_BASED_OUTPATIENT_CLINIC_OR_DEPARTMENT_OTHER): Payer: Medicare Other | Admitting: Nurse Practitioner

## 2023-06-11 ENCOUNTER — Encounter: Payer: Self-pay | Admitting: Nurse Practitioner

## 2023-06-11 VITALS — BP 125/72 | HR 113 | Temp 100.1°F | Resp 18

## 2023-06-11 DIAGNOSIS — N433 Hydrocele, unspecified: Secondary | ICD-10-CM | POA: Diagnosis present

## 2023-06-11 DIAGNOSIS — E871 Hypo-osmolality and hyponatremia: Secondary | ICD-10-CM | POA: Diagnosis present

## 2023-06-11 DIAGNOSIS — T80212A Local infection due to central venous catheter, initial encounter: Secondary | ICD-10-CM | POA: Diagnosis present

## 2023-06-11 DIAGNOSIS — R530 Neoplastic (malignant) related fatigue: Secondary | ICD-10-CM

## 2023-06-11 DIAGNOSIS — D696 Thrombocytopenia, unspecified: Secondary | ICD-10-CM | POA: Diagnosis present

## 2023-06-11 DIAGNOSIS — I8510 Secondary esophageal varices without bleeding: Secondary | ICD-10-CM | POA: Diagnosis present

## 2023-06-11 DIAGNOSIS — Z79899 Other long term (current) drug therapy: Secondary | ICD-10-CM

## 2023-06-11 DIAGNOSIS — Z800 Family history of malignant neoplasm of digestive organs: Secondary | ICD-10-CM

## 2023-06-11 DIAGNOSIS — Z794 Long term (current) use of insulin: Secondary | ICD-10-CM | POA: Diagnosis not present

## 2023-06-11 DIAGNOSIS — K7460 Unspecified cirrhosis of liver: Secondary | ICD-10-CM | POA: Diagnosis present

## 2023-06-11 DIAGNOSIS — C220 Liver cell carcinoma: Secondary | ICD-10-CM

## 2023-06-11 DIAGNOSIS — K31811 Angiodysplasia of stomach and duodenum with bleeding: Secondary | ICD-10-CM | POA: Diagnosis present

## 2023-06-11 DIAGNOSIS — D6959 Other secondary thrombocytopenia: Secondary | ICD-10-CM | POA: Diagnosis present

## 2023-06-11 DIAGNOSIS — T80211A Bloodstream infection due to central venous catheter, initial encounter: Principal | ICD-10-CM | POA: Diagnosis present

## 2023-06-11 DIAGNOSIS — R7881 Bacteremia: Secondary | ICD-10-CM | POA: Diagnosis present

## 2023-06-11 DIAGNOSIS — Z8505 Personal history of malignant neoplasm of liver: Secondary | ICD-10-CM

## 2023-06-11 DIAGNOSIS — R6510 Systemic inflammatory response syndrome (SIRS) of non-infectious origin without acute organ dysfunction: Principal | ICD-10-CM | POA: Diagnosis present

## 2023-06-11 DIAGNOSIS — Y838 Other surgical procedures as the cause of abnormal reaction of the patient, or of later complication, without mention of misadventure at the time of the procedure: Secondary | ICD-10-CM | POA: Diagnosis present

## 2023-06-11 DIAGNOSIS — R509 Fever, unspecified: Secondary | ICD-10-CM | POA: Diagnosis not present

## 2023-06-11 DIAGNOSIS — I81 Portal vein thrombosis: Secondary | ICD-10-CM | POA: Diagnosis present

## 2023-06-11 DIAGNOSIS — T402X5A Adverse effect of other opioids, initial encounter: Secondary | ICD-10-CM | POA: Diagnosis present

## 2023-06-11 DIAGNOSIS — B9689 Other specified bacterial agents as the cause of diseases classified elsewhere: Secondary | ICD-10-CM | POA: Diagnosis present

## 2023-06-11 DIAGNOSIS — A498 Other bacterial infections of unspecified site: Secondary | ICD-10-CM

## 2023-06-11 DIAGNOSIS — B377 Candidal sepsis: Secondary | ICD-10-CM | POA: Diagnosis present

## 2023-06-11 DIAGNOSIS — Z8249 Family history of ischemic heart disease and other diseases of the circulatory system: Secondary | ICD-10-CM

## 2023-06-11 DIAGNOSIS — R630 Anorexia: Secondary | ICD-10-CM | POA: Diagnosis not present

## 2023-06-11 DIAGNOSIS — A4189 Other specified sepsis: Secondary | ICD-10-CM | POA: Diagnosis present

## 2023-06-11 DIAGNOSIS — Z87891 Personal history of nicotine dependence: Secondary | ICD-10-CM

## 2023-06-11 DIAGNOSIS — B961 Klebsiella pneumoniae [K. pneumoniae] as the cause of diseases classified elsewhere: Secondary | ICD-10-CM | POA: Diagnosis present

## 2023-06-11 DIAGNOSIS — E222 Syndrome of inappropriate secretion of antidiuretic hormone: Secondary | ICD-10-CM | POA: Diagnosis present

## 2023-06-11 DIAGNOSIS — Z1624 Resistance to multiple antibiotics: Secondary | ICD-10-CM | POA: Diagnosis present

## 2023-06-11 DIAGNOSIS — Z515 Encounter for palliative care: Secondary | ICD-10-CM

## 2023-06-11 DIAGNOSIS — E1165 Type 2 diabetes mellitus with hyperglycemia: Secondary | ICD-10-CM | POA: Diagnosis present

## 2023-06-11 DIAGNOSIS — E1136 Type 2 diabetes mellitus with diabetic cataract: Secondary | ICD-10-CM | POA: Diagnosis present

## 2023-06-11 DIAGNOSIS — R531 Weakness: Secondary | ICD-10-CM

## 2023-06-11 DIAGNOSIS — I10 Essential (primary) hypertension: Secondary | ICD-10-CM | POA: Diagnosis present

## 2023-06-11 DIAGNOSIS — I152 Hypertension secondary to endocrine disorders: Secondary | ICD-10-CM | POA: Diagnosis present

## 2023-06-11 DIAGNOSIS — E1159 Type 2 diabetes mellitus with other circulatory complications: Secondary | ICD-10-CM | POA: Diagnosis present

## 2023-06-11 DIAGNOSIS — T80212D Local infection due to central venous catheter, subsequent encounter: Secondary | ICD-10-CM | POA: Diagnosis not present

## 2023-06-11 DIAGNOSIS — Z923 Personal history of irradiation: Secondary | ICD-10-CM

## 2023-06-11 DIAGNOSIS — C779 Secondary and unspecified malignant neoplasm of lymph node, unspecified: Secondary | ICD-10-CM | POA: Diagnosis present

## 2023-06-11 DIAGNOSIS — K31819 Angiodysplasia of stomach and duodenum without bleeding: Secondary | ICD-10-CM | POA: Diagnosis not present

## 2023-06-11 DIAGNOSIS — Z7984 Long term (current) use of oral hypoglycemic drugs: Secondary | ICD-10-CM

## 2023-06-11 DIAGNOSIS — D500 Iron deficiency anemia secondary to blood loss (chronic): Secondary | ICD-10-CM | POA: Diagnosis present

## 2023-06-11 DIAGNOSIS — B182 Chronic viral hepatitis C: Secondary | ICD-10-CM | POA: Diagnosis present

## 2023-06-11 DIAGNOSIS — E119 Type 2 diabetes mellitus without complications: Secondary | ICD-10-CM

## 2023-06-11 DIAGNOSIS — G893 Neoplasm related pain (acute) (chronic): Secondary | ICD-10-CM | POA: Diagnosis not present

## 2023-06-11 DIAGNOSIS — Z1152 Encounter for screening for COVID-19: Secondary | ICD-10-CM | POA: Diagnosis not present

## 2023-06-11 DIAGNOSIS — E7800 Pure hypercholesterolemia, unspecified: Secondary | ICD-10-CM | POA: Diagnosis present

## 2023-06-11 DIAGNOSIS — K5903 Drug induced constipation: Secondary | ICD-10-CM | POA: Diagnosis present

## 2023-06-11 DIAGNOSIS — E876 Hypokalemia: Secondary | ICD-10-CM | POA: Diagnosis not present

## 2023-06-11 LAB — CBC WITH DIFFERENTIAL/PLATELET
Abs Immature Granulocytes: 0.1 10*3/uL — ABNORMAL HIGH (ref 0.00–0.07)
Basophils Absolute: 0 10*3/uL (ref 0.0–0.1)
Basophils Relative: 0 %
Eosinophils Absolute: 0 10*3/uL (ref 0.0–0.5)
Eosinophils Relative: 0 %
HCT: 30.1 % — ABNORMAL LOW (ref 39.0–52.0)
Hemoglobin: 10.2 g/dL — ABNORMAL LOW (ref 13.0–17.0)
Immature Granulocytes: 1 %
Lymphocytes Relative: 6 %
Lymphs Abs: 0.8 10*3/uL (ref 0.7–4.0)
MCH: 31 pg (ref 26.0–34.0)
MCHC: 33.9 g/dL (ref 30.0–36.0)
MCV: 91.5 fL (ref 80.0–100.0)
Monocytes Absolute: 1.2 10*3/uL — ABNORMAL HIGH (ref 0.1–1.0)
Monocytes Relative: 9 %
Neutro Abs: 10.8 10*3/uL — ABNORMAL HIGH (ref 1.7–7.7)
Neutrophils Relative %: 84 %
Platelets: 78 10*3/uL — ABNORMAL LOW (ref 150–400)
RBC: 3.29 MIL/uL — ABNORMAL LOW (ref 4.22–5.81)
RDW: 14.6 % (ref 11.5–15.5)
WBC: 12.9 10*3/uL — ABNORMAL HIGH (ref 4.0–10.5)
nRBC: 0 % (ref 0.0–0.2)

## 2023-06-11 LAB — CBG MONITORING, ED: Glucose-Capillary: 246 mg/dL — ABNORMAL HIGH (ref 70–99)

## 2023-06-11 LAB — URINALYSIS, ROUTINE W REFLEX MICROSCOPIC
Bacteria, UA: NONE SEEN
Bilirubin Urine: NEGATIVE
Glucose, UA: 50 mg/dL — AB
Ketones, ur: NEGATIVE mg/dL
Leukocytes,Ua: NEGATIVE
Nitrite: NEGATIVE
Protein, ur: 30 mg/dL — AB
Specific Gravity, Urine: 1.038 — ABNORMAL HIGH (ref 1.005–1.030)
pH: 5 (ref 5.0–8.0)

## 2023-06-11 LAB — TSH: TSH: 1.568 u[IU]/mL (ref 0.350–4.500)

## 2023-06-11 LAB — URINALYSIS, W/ REFLEX TO CULTURE (INFECTION SUSPECTED)
Bacteria, UA: NONE SEEN
Bilirubin Urine: NEGATIVE
Glucose, UA: 50 mg/dL — AB
Ketones, ur: NEGATIVE mg/dL
Leukocytes,Ua: NEGATIVE
Nitrite: NEGATIVE
Protein, ur: 30 mg/dL — AB
Specific Gravity, Urine: 1.027 (ref 1.005–1.030)
pH: 5 (ref 5.0–8.0)

## 2023-06-11 LAB — COMPREHENSIVE METABOLIC PANEL
ALT: 43 U/L (ref 0–44)
AST: 109 U/L — ABNORMAL HIGH (ref 15–41)
Albumin: 2.3 g/dL — ABNORMAL LOW (ref 3.5–5.0)
Alkaline Phosphatase: 257 U/L — ABNORMAL HIGH (ref 38–126)
Anion gap: 9 (ref 5–15)
BUN: 25 mg/dL — ABNORMAL HIGH (ref 8–23)
CO2: 23 mmol/L (ref 22–32)
Calcium: 8.8 mg/dL — ABNORMAL LOW (ref 8.9–10.3)
Chloride: 95 mmol/L — ABNORMAL LOW (ref 98–111)
Creatinine, Ser: 1.07 mg/dL (ref 0.61–1.24)
GFR, Estimated: >60 mL/min (ref >60)
Glucose, Bld: 220 mg/dL — ABNORMAL HIGH (ref 70–99)
Potassium: 4.3 mmol/L (ref 3.5–5.1)
Sodium: 127 mmol/L — ABNORMAL LOW (ref 135–145)
Total Bilirubin: 2.2 mg/dL — ABNORMAL HIGH (ref 0.0–1.2)
Total Protein: 7.8 g/dL (ref 6.5–8.1)

## 2023-06-11 LAB — RESP PANEL BY RT-PCR (RSV, FLU A&B, COVID)  RVPGX2
Influenza A by PCR: NEGATIVE
Influenza B by PCR: NEGATIVE
Resp Syncytial Virus by PCR: NEGATIVE
SARS Coronavirus 2 by RT PCR: NEGATIVE

## 2023-06-11 LAB — PROTIME-INR
INR: 1.3 — ABNORMAL HIGH (ref 0.8–1.2)
Prothrombin Time: 16.4 s — ABNORMAL HIGH (ref 11.4–15.2)

## 2023-06-11 LAB — I-STAT CG4 LACTIC ACID, ED
Lactic Acid, Venous: 2.8 mmol/L (ref 0.5–1.9)
Lactic Acid, Venous: 2.8 mmol/L (ref 0.5–1.9)

## 2023-06-11 LAB — NA AND K (SODIUM & POTASSIUM), RAND UR
Potassium Urine: 50 mmol/L
Sodium, Ur: <10 mmol/L

## 2023-06-11 LAB — APTT: aPTT: 28 s (ref 24–36)

## 2023-06-11 LAB — LACTATE DEHYDROGENASE: LDH: 274 U/L — ABNORMAL HIGH (ref 98–192)

## 2023-06-11 MED ORDER — METRONIDAZOLE 500 MG/100ML IV SOLN
500.0000 mg | Freq: Two times a day (BID) | INTRAVENOUS | Status: DC
  Administered 2023-06-11 – 2023-06-12 (×2): 500 mg via INTRAVENOUS
  Filled 2023-06-11 (×2): qty 100

## 2023-06-11 MED ORDER — ACETAMINOPHEN 650 MG RE SUPP: 650.0000 mg | Freq: Four times a day (QID) | RECTAL | Status: DC | PRN

## 2023-06-11 MED ORDER — INSULIN ASPART 100 UNIT/ML IJ SOLN
0.0000 [IU] | Freq: Every day | INTRAMUSCULAR | Status: DC
  Administered 2023-06-11 – 2023-06-12 (×2): 2 [IU] via SUBCUTANEOUS
  Administered 2023-06-13: 3 [IU] via SUBCUTANEOUS
  Administered 2023-06-14: 2 [IU] via SUBCUTANEOUS
  Filled 2023-06-11: qty 0.05

## 2023-06-11 MED ORDER — SODIUM CHLORIDE 0.9 % IV SOLN
2.0000 g | Freq: Two times a day (BID) | INTRAVENOUS | Status: DC
  Administered 2023-06-12 – 2023-06-13 (×3): 2 g via INTRAVENOUS
  Filled 2023-06-11 (×3): qty 12.5

## 2023-06-11 MED ORDER — ACETAMINOPHEN 325 MG PO TABS
650.0000 mg | ORAL_TABLET | Freq: Four times a day (QID) | ORAL | Status: DC | PRN
  Administered 2023-06-14 – 2023-06-16 (×5): 650 mg via ORAL
  Filled 2023-06-11 (×5): qty 2

## 2023-06-11 MED ORDER — SENNOSIDES-DOCUSATE SODIUM 8.6-50 MG PO TABS
1.0000 | ORAL_TABLET | Freq: Every evening | ORAL | Status: DC | PRN
  Administered 2023-06-14: 1 via ORAL
  Filled 2023-06-11 (×2): qty 1

## 2023-06-11 MED ORDER — SODIUM CHLORIDE 0.9 % IV SOLN
INTRAVENOUS | Status: AC
Start: 2023-06-11 — End: 2023-06-11

## 2023-06-11 MED ORDER — PANTOPRAZOLE SODIUM 40 MG PO TBEC: 40.0000 mg | DELAYED_RELEASE_TABLET | Freq: Every day | ORAL | Status: DC | PRN

## 2023-06-11 MED ORDER — VANCOMYCIN HCL 1250 MG/250ML IV SOLN
1250.0000 mg | Freq: Once | INTRAVENOUS | Status: AC
  Administered 2023-06-11: 1250 mg via INTRAVENOUS
  Filled 2023-06-11: qty 250

## 2023-06-11 MED ORDER — MORPHINE SULFATE ER 15 MG PO TBCR: 15.0000 mg | EXTENDED_RELEASE_TABLET | Freq: Two times a day (BID) | ORAL | Status: DC | PRN

## 2023-06-11 MED ORDER — ONDANSETRON HCL 4 MG/2ML IJ SOLN
8.0000 mg | Freq: Once | INTRAMUSCULAR | Status: AC
  Administered 2023-06-11: 8 mg via INTRAVENOUS
  Filled 2023-06-11: qty 4

## 2023-06-11 MED ORDER — ONDANSETRON HCL 4 MG PO TABS: 4.0000 mg | ORAL_TABLET | Freq: Four times a day (QID) | ORAL | Status: DC | PRN

## 2023-06-11 MED ORDER — ONDANSETRON HCL 4 MG/2ML IJ SOLN: 4.0000 mg | Freq: Four times a day (QID) | INTRAMUSCULAR | Status: DC | PRN

## 2023-06-11 MED ORDER — LACTATED RINGERS IV BOLUS
1000.0000 mL | Freq: Once | INTRAVENOUS | Status: AC
  Administered 2023-06-11: 1000 mL via INTRAVENOUS

## 2023-06-11 MED ORDER — HYDROCODONE-ACETAMINOPHEN 10-325 MG PO TABS
1.0000 | ORAL_TABLET | Freq: Four times a day (QID) | ORAL | Status: DC | PRN
  Administered 2023-06-11 – 2023-06-20 (×16): 1 via ORAL
  Filled 2023-06-11 (×16): qty 1

## 2023-06-11 MED ORDER — LACTATED RINGERS IV SOLN: INTRAVENOUS | Status: AC

## 2023-06-11 MED ORDER — INSULIN ASPART 100 UNIT/ML IJ SOLN
0.0000 [IU] | Freq: Three times a day (TID) | INTRAMUSCULAR | Status: DC
  Administered 2023-06-12 (×2): 5 [IU] via SUBCUTANEOUS
  Administered 2023-06-12: 2 [IU] via SUBCUTANEOUS
  Administered 2023-06-13: 3 [IU] via SUBCUTANEOUS
  Administered 2023-06-13 – 2023-06-14 (×3): 2 [IU] via SUBCUTANEOUS
  Administered 2023-06-14: 3 [IU] via SUBCUTANEOUS
  Administered 2023-06-15: 2 [IU] via SUBCUTANEOUS
  Administered 2023-06-15: 3 [IU] via SUBCUTANEOUS
  Administered 2023-06-15: 2 [IU] via SUBCUTANEOUS
  Administered 2023-06-16: 1 [IU] via SUBCUTANEOUS
  Administered 2023-06-17 (×3): 2 [IU] via SUBCUTANEOUS
  Administered 2023-06-18: 3 [IU] via SUBCUTANEOUS
  Administered 2023-06-18: 5 [IU] via SUBCUTANEOUS
  Administered 2023-06-18 – 2023-06-20 (×2): 1 [IU] via SUBCUTANEOUS
  Filled 2023-06-11: qty 0.09

## 2023-06-11 MED ORDER — VANCOMYCIN HCL IN DEXTROSE 1-5 GM/200ML-% IV SOLN: 1000.0000 mg | INTRAVENOUS | Status: DC

## 2023-06-11 MED ORDER — IOHEXOL 300 MG/ML  SOLN
100.0000 mL | Freq: Once | INTRAMUSCULAR | Status: AC | PRN
  Administered 2023-06-11: 100 mL via INTRAVENOUS

## 2023-06-11 MED ORDER — SODIUM CHLORIDE 0.9 % IV SOLN
2.0000 g | Freq: Once | INTRAVENOUS | Status: AC
  Administered 2023-06-11: 2 g via INTRAVENOUS
  Filled 2023-06-11: qty 12.5

## 2023-06-11 MED ORDER — MORPHINE SULFATE (PF) 2 MG/ML IV SOLN
2.0000 mg | Freq: Once | INTRAVENOUS | Status: AC
  Administered 2023-06-11: 2 mg via INTRAVENOUS
  Filled 2023-06-11: qty 1

## 2023-06-11 MED ORDER — DICYCLOMINE HCL 10 MG PO CAPS
10.0000 mg | ORAL_CAPSULE | Freq: Three times a day (TID) | ORAL | Status: DC
  Administered 2023-06-12 – 2023-06-20 (×32): 10 mg via ORAL
  Filled 2023-06-11 (×31): qty 1

## 2023-06-11 NOTE — Progress Notes (Signed)
ED Pharmacy Antibiotic Sign Off An antibiotic consult was received from an ED provider for vancomycin and cefepime per pharmacy dosing for sepsis, questionable port infection. A chart review was completed to assess appropriateness.   The following one time order(s) were placed:  Vancomycin 1250mg  IV x1 Cefepime 2g IV x1  Further antibiotic and/or antibiotic pharmacy consults should be ordered by the admitting provider if indicated.    Thank you for allowing pharmacy to be a part of this patient's care.   Cherylin Mylar, PharmD Clinical Pharmacist  1/30/20258:45 PM

## 2023-06-11 NOTE — ED Triage Notes (Signed)
Pt brought to ED from cancer center due to pt pt reporting not feeling well; RLQ abdominal pain with temp of 100.1. Pt report waking up in sweat this morning; feeling warm but did not take temp for the last 2 days. Hx of Liver cancer stage 4, Type 2 Diabetes Mellitus, Hep C. Pt port not being accessed due to potential infection; plan on removal.  In route IV 24 ga IV 2mg  IV Morphine 8mg  IV Zofran

## 2023-06-11 NOTE — Progress Notes (Signed)
Patient presents today for 1 Liter of Normal Saline due to feeling fatigue with loss of appetite, diarrhea, lower abd pain, and "sweats" x1-2 days. Upon arrival patient's temp 100.1 and HR 113. Dr [name] made aware of VS. Pallative nurse came to talk to patient.   Plan made for patient to go to ER for blood work and evaluation per Dr [name].

## 2023-06-11 NOTE — Telephone Encounter (Signed)
Pt's wife called stating pt had a bad day on 06/10/2023.  Pt's wife stated she's not able to get him to really eat or drink.  Pt's wife stated he was having abdominal pain yesterday and eventually took the morphine "Nikki" Mayra Reel, NP) prescribed.  Pt's wife stated the morphine helped the pt's pain but wife if worried that the pt is dehydrated.  Pt's wife stated she has tried numerous times to have the pt go to the ED for further evaluation but the pt keeps refusing but continues to lay around.  Pt's wife stated the pt has not really tried getting up out of bed after she's encouraged him to numerous times.  Pt's wife wants to know if the pt could come in for hydration since the pt would not go to the ED.  Pt requested if Lowella Bandy could please give her call regarding the pt.
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
## 2023-06-11 NOTE — Telephone Encounter (Signed)
Received notification from med-onc team that pt was not feeling well. This RN called pt and wife. Pt c/o weakness, fatigue, pain, and once occurrence of loose stools. Pt reported that yesterday 1/29 was a bad day but today 1/30 is a better day. Pt reported abdominal pain rating 9/10 but decreasing to 4/10 with prescribed pain medications. No nausea or vomiting, pt decreased oral intake d/t pain and fatigue, dark yellow urine. Pt asking if he can receive IVF. Orders placed per Lowella Bandy, NP and appt scheduled with infusion room. Pt and wife agreeable to plan. Informed infusion team that per Lowella Bandy, NP do not use port as it is going to be removed d/t a potential indwelling infection.

## 2023-06-11 NOTE — Telephone Encounter (Signed)
Per Lowella Bandy, NP and Dr.Feng pt taken to ED room 11 for fever workup from cancer center infusion room. Report given to ED RN, all questions answered, pt family updated, no further needs at this time.

## 2023-06-11 NOTE — Hospital Course (Addendum)
Troy [name] is a 68 y.o. male with medical history significant for stage IV hepatocellular carcinoma (s/p radiation, TACE 2023), cirrhosis with esophageal varices, chronic portal vein thrombosis off anticoagulation, history of GI bleed w/ GAVE, iron deficiency anemia due to chronic blood loss, chronic thrombocytopenia, insulin-dependent T2DM, HTN who is admitted with SIRS-with fever chills poor oral intake. Recent admission 12/10 - 12/13 with Klebsiella sepsis.  Recent outpatient evaluation by Dr. Sherron Monday on 1/21 and subsequently sent to the ED after seen again in the cancer center 1/30 for evaluation of fever with poor oral intake fatigue diaphoresis, was given 1 L normal saline. In ED:137/79, pulse 106, RR 16, temp 99.5 F, SpO2 97% on room air. Labs>WBC 12.9, hemoglobin 10.2, platelets 78,000, sodium 127 (130 when corrected for glucose), potassium 4.3, bicarb 23, BUN 25, creatinine 1.07, serum glucose 220, AST 109, ALT 43, alk phos 257, total bilirubin 2.2, lactic acid 2.8 x 2. UA negative for UTI.  SARS-CoV-2, influenza, RSV PCR negative.  LDH 274.  TSH 1.568.  Blood cultures in process. Cxr> neg for focal consolidation, edema, effusion.  Right Port-A-Cath tip at low SVC. CT abdomen/pelvis with contrast >>showed progression of disease with increase in the size of liver mass, progression of nodal metastasis.  Increase in the size of the metastatic implants in the inferior right rectus sheath and right pelvic sidewall.  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  No bowel obstruction.  Normal appearing appendix. Patient was given 1 L LR.  EDP discussed with oncology Dr. Al Pimple who recommended starting vancomycin and cefepime and medical admission.

## 2023-06-11 NOTE — Progress Notes (Signed)
Pharmacy Antibiotic Note  Veniamin Kincaid is a 68 y.o. male admitted on 06/11/2023 with sepsis.   Source unclear- possible port-a-cath infection.   PMH significant for Stage IV hepatocellular carcinoma and cirrhosis.  Renal function at patient's baseline.  Pharmacy has been consulted for Cefepime + Vancomycin dosing.  Plan: Cefepime 2gm IV q12h Vancomycin 1gm IV q24h to target AUC 400-550.  Estimated AUC on this regimen = 442. Monitor renal function and cx data      Temp (24hrs), Avg:99.8 F (37.7 C), Min:99.5 F (37.5 C), Max:100.1 F (37.8 C)  Recent Labs  Lab 06/11/23 1617 06/11/23 1718 06/11/23 1931  WBC 12.9*  --   --   CREATININE 1.07  --   --   LATICACIDVEN  --  2.8* 2.8*    Estimated Creatinine Clearance: 56.5 mL/min (by C-G formula based on SCr of 1.07 mg/dL).    No Known Allergies  Antimicrobials this admission: 1/30 Cefepime >>  1/30 Vancomycin >>   Dose adjustments this admission:  Microbiology results: 1/30 BCx (x2 peripheral):  1/30 BCx (x2 Port): 1/30 Resp PCR: negative MRSA PCR:   Thank you for allowing pharmacy to be a part of this patient's care.  Junita Push PharmD 06/11/2023 10:22 PM

## 2023-06-12 ENCOUNTER — Inpatient Hospital Stay (HOSPITAL_COMMUNITY): Payer: No Typology Code available for payment source

## 2023-06-12 ENCOUNTER — Other Ambulatory Visit: Payer: Self-pay | Admitting: Radiology

## 2023-06-12 DIAGNOSIS — R6510 Systemic inflammatory response syndrome (SIRS) of non-infectious origin without acute organ dysfunction: Secondary | ICD-10-CM

## 2023-06-12 HISTORY — PX: IR REMOVAL TUN ACCESS W/ PORT W/O FL MOD SED: IMG2290

## 2023-06-12 LAB — COMPREHENSIVE METABOLIC PANEL
ALT: 31 U/L (ref 0–44)
AST: 76 U/L — ABNORMAL HIGH (ref 15–41)
Albumin: 1.9 g/dL — ABNORMAL LOW (ref 3.5–5.0)
Alkaline Phosphatase: 204 U/L — ABNORMAL HIGH (ref 38–126)
Anion gap: 7 (ref 5–15)
BUN: 23 mg/dL (ref 8–23)
CO2: 23 mmol/L (ref 22–32)
Calcium: 8.3 mg/dL — ABNORMAL LOW (ref 8.9–10.3)
Chloride: 97 mmol/L — ABNORMAL LOW (ref 98–111)
Creatinine, Ser: 1.13 mg/dL (ref 0.61–1.24)
GFR, Estimated: >60 mL/min (ref >60)
Glucose, Bld: 257 mg/dL — ABNORMAL HIGH (ref 70–99)
Potassium: 4 mmol/L (ref 3.5–5.1)
Sodium: 127 mmol/L — ABNORMAL LOW (ref 135–145)
Total Bilirubin: 1.7 mg/dL — ABNORMAL HIGH (ref 0.0–1.2)
Total Protein: 6.6 g/dL (ref 6.5–8.1)

## 2023-06-12 LAB — BLOOD CULTURE ID PANEL (REFLEXED) - BCID2
A.calcoaceticus-baumannii: NOT DETECTED
A.calcoaceticus-baumannii: NOT DETECTED
Bacteroides fragilis: NOT DETECTED
Bacteroides fragilis: NOT DETECTED
CTX-M ESBL: NOT DETECTED
CTX-M ESBL: NOT DETECTED
Candida albicans: NOT DETECTED
Candida albicans: NOT DETECTED
Candida auris: NOT DETECTED
Candida auris: NOT DETECTED
Candida glabrata: NOT DETECTED
Candida glabrata: NOT DETECTED
Candida krusei: NOT DETECTED
Candida krusei: NOT DETECTED
Candida parapsilosis: NOT DETECTED
Candida parapsilosis: NOT DETECTED
Candida tropicalis: NOT DETECTED
Candida tropicalis: NOT DETECTED
Carbapenem resist OXA 48 LIKE: NOT DETECTED
Carbapenem resist OXA 48 LIKE: NOT DETECTED
Carbapenem resistance IMP: NOT DETECTED
Carbapenem resistance IMP: NOT DETECTED
Carbapenem resistance KPC: NOT DETECTED
Carbapenem resistance KPC: NOT DETECTED
Carbapenem resistance NDM: NOT DETECTED
Carbapenem resistance NDM: NOT DETECTED
Carbapenem resistance VIM: NOT DETECTED
Carbapenem resistance VIM: NOT DETECTED
Cryptococcus neoformans/gattii: NOT DETECTED
Cryptococcus neoformans/gattii: NOT DETECTED
Enterobacter cloacae complex: NOT DETECTED
Enterobacter cloacae complex: NOT DETECTED
Enterobacterales: DETECTED — AB
Enterobacterales: DETECTED — AB
Enterococcus Faecium: NOT DETECTED
Enterococcus Faecium: NOT DETECTED
Enterococcus faecalis: NOT DETECTED
Enterococcus faecalis: NOT DETECTED
Escherichia coli: NOT DETECTED
Escherichia coli: NOT DETECTED
Haemophilus influenzae: NOT DETECTED
Haemophilus influenzae: NOT DETECTED
Klebsiella aerogenes: NOT DETECTED
Klebsiella aerogenes: NOT DETECTED
Klebsiella oxytoca: NOT DETECTED
Klebsiella oxytoca: NOT DETECTED
Klebsiella pneumoniae: NOT DETECTED
Klebsiella pneumoniae: NOT DETECTED
Listeria monocytogenes: NOT DETECTED
Listeria monocytogenes: NOT DETECTED
Neisseria meningitidis: NOT DETECTED
Neisseria meningitidis: NOT DETECTED
Proteus species: NOT DETECTED
Proteus species: NOT DETECTED
Pseudomonas aeruginosa: NOT DETECTED
Pseudomonas aeruginosa: NOT DETECTED
Salmonella species: NOT DETECTED
Salmonella species: NOT DETECTED
Serratia marcescens: NOT DETECTED
Serratia marcescens: NOT DETECTED
Staphylococcus aureus (BCID): NOT DETECTED
Staphylococcus aureus (BCID): NOT DETECTED
Staphylococcus epidermidis: NOT DETECTED
Staphylococcus epidermidis: NOT DETECTED
Staphylococcus lugdunensis: NOT DETECTED
Staphylococcus lugdunensis: NOT DETECTED
Staphylococcus species: NOT DETECTED
Staphylococcus species: DETECTED — AB
Stenotrophomonas maltophilia: NOT DETECTED
Stenotrophomonas maltophilia: NOT DETECTED
Streptococcus agalactiae: NOT DETECTED
Streptococcus agalactiae: NOT DETECTED
Streptococcus pneumoniae: NOT DETECTED
Streptococcus pneumoniae: NOT DETECTED
Streptococcus pyogenes: NOT DETECTED
Streptococcus pyogenes: NOT DETECTED
Streptococcus species: NOT DETECTED
Streptococcus species: NOT DETECTED

## 2023-06-12 LAB — GLUCOSE, CAPILLARY
Glucose-Capillary: 262 mg/dL — ABNORMAL HIGH (ref 70–99)
Glucose-Capillary: 203 mg/dL — ABNORMAL HIGH (ref 70–99)

## 2023-06-12 LAB — CBC
HCT: 25.1 % — ABNORMAL LOW (ref 39.0–52.0)
Hemoglobin: 8.5 g/dL — ABNORMAL LOW (ref 13.0–17.0)
MCH: 31.1 pg (ref 26.0–34.0)
MCHC: 33.9 g/dL (ref 30.0–36.0)
MCV: 91.9 fL (ref 80.0–100.0)
Platelets: 64 10*3/uL — ABNORMAL LOW (ref 150–400)
RBC: 2.73 MIL/uL — ABNORMAL LOW (ref 4.22–5.81)
RDW: 14.6 % (ref 11.5–15.5)
WBC: 9 10*3/uL (ref 4.0–10.5)
nRBC: 0 % (ref 0.0–0.2)

## 2023-06-12 LAB — OSMOLALITY, URINE: Osmolality, Ur: 523 mosm/kg (ref 300–900)

## 2023-06-12 LAB — OSMOLALITY: Osmolality: 299 mosm/kg — ABNORMAL HIGH (ref 275–295)

## 2023-06-12 LAB — CBG MONITORING, ED
Glucose-Capillary: 255 mg/dL — ABNORMAL HIGH (ref 70–99)
Glucose-Capillary: 173 mg/dL — ABNORMAL HIGH (ref 70–99)

## 2023-06-12 LAB — PROCALCITONIN: Procalcitonin: 6.32 ng/mL

## 2023-06-12 MED ORDER — VANCOMYCIN HCL 1250 MG/250ML IV SOLN
1250.0000 mg | INTRAVENOUS | Status: DC
  Administered 2023-06-12 – 2023-06-13 (×2): 1250 mg via INTRAVENOUS
  Filled 2023-06-12 (×2): qty 250

## 2023-06-12 MED ORDER — INSULIN GLARGINE-YFGN 100 UNIT/ML ~~LOC~~ SOLN
7.0000 [IU] | Freq: Every day | SUBCUTANEOUS | Status: DC
  Administered 2023-06-12 – 2023-06-13 (×2): 7 [IU] via SUBCUTANEOUS
  Filled 2023-06-12 (×2): qty 0.07

## 2023-06-12 MED ORDER — POLYVINYL ALCOHOL 1.4 % OP SOLN: 1.0000 [drp] | Freq: Four times a day (QID) | OPHTHALMIC | Status: DC | PRN

## 2023-06-12 MED ORDER — LIDOCAINE-EPINEPHRINE 1 %-1:100000 IJ SOLN
20.0000 mL | Freq: Once | INTRAMUSCULAR | Status: AC
  Administered 2023-06-12: 10 mL via INTRADERMAL
  Filled 2023-06-12: qty 20

## 2023-06-12 MED ORDER — LIDOCAINE-EPINEPHRINE 1 %-1:100000 IJ SOLN
INTRAMUSCULAR | Status: AC
  Filled 2023-06-12: qty 1

## 2023-06-12 MED ORDER — PANTOPRAZOLE SODIUM 40 MG PO TBEC
40.0000 mg | DELAYED_RELEASE_TABLET | Freq: Two times a day (BID) | ORAL | Status: DC
Start: 2023-06-12 — End: 2023-06-20
  Administered 2023-06-12 – 2023-06-20 (×16): 40 mg via ORAL
  Filled 2023-06-12 (×16): qty 1

## 2023-06-12 MED ORDER — ENSURE ENLIVE PO LIQD: 237.0000 mL | Freq: Two times a day (BID) | ORAL | Status: DC | PRN

## 2023-06-12 MED ORDER — PANTOPRAZOLE SODIUM 40 MG PO TBEC: 40.0000 mg | DELAYED_RELEASE_TABLET | Freq: Two times a day (BID) | ORAL | Status: DC

## 2023-06-12 NOTE — Progress Notes (Signed)
PROGRESS NOTE Troy [name]  XLK:[id number] DOB: [birth year] DOA: 06/11/2023 PCP: [name], MD  Brief Narrative/Hospital Course: Troy [name] is a 68 y.o. male with medical history significant for stage IV hepatocellular carcinoma (s/p radiation, TACE 2023), cirrhosis with esophageal varices, chronic portal vein thrombosis off anticoagulation, history of GI bleed w/ GAVE, iron deficiency anemia due to chronic blood loss, chronic thrombocytopenia, insulin-dependent T2DM, HTN who is admitted with SIRS-with fever chills poor oral intake. Recent admission 12/10 - 12/13 with Klebsiella sepsis.  Recent outpatient evaluation by Dr. Sherron Monday on 1/21 and subsequently sent to the ED after seen again in the cancer center 1/30 for evaluation of fever with poor oral intake fatigue diaphoresis, was given 1 L normal saline. In ED:137/79, pulse 106, RR 16, temp 99.5 F, SpO2 97% on room air. Labs>WBC 12.9, hemoglobin 10.2, platelets 78,000, sodium 127 (130 when corrected for glucose), potassium 4.3, bicarb 23, BUN 25, creatinine 1.07, serum glucose 220, AST 109, ALT 43, alk phos 257, total bilirubin 2.2, lactic acid 2.8 x 2. UA negative for UTI.  SARS-CoV-2, influenza, RSV PCR negative.  LDH 274.  TSH 1.568.  Blood cultures in process. Cxr> neg for focal consolidation, edema, effusion.  Right Port-A-Cath tip at low SVC. CT abdomen/pelvis with contrast >>showed progression of disease with increase in the size of liver mass, progression of nodal metastasis.  Increase in the size of the metastatic implants in the inferior right rectus sheath and right pelvic sidewall.  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  No bowel obstruction.  Normal appearing appendix. Patient was given 1 L LR.  EDP discussed with oncology Dr. Al Pimple who recommended starting vancomycin and cefepime and medical admission.     Subjective: See this am Wife at bedside Fees better-  has pain on lower abdomen Food does not taste good and has poor appetite No nausea or vomiting chills fever  Assessment and Plan: Principal Problem:   SIRS (systemic inflammatory response syndrome) (HCC) Active Problems:   Secondary esophageal varices without bleeding (HCC)   Hepatic cirrhosis due to chronic hepatitis C infection (HCC)   Type 2 diabetes mellitus (HCC)   Hypertension associated with diabetes (HCC)   Hepatocellular carcinoma (HCC)   Thrombocytopenia (HCC)   GAVE (gastric antral vascular ectasia)   Cancer associated pain   Hyponatremia   Enterobacterales sepsis POA Recent admit 04/20/2024-04/23/2024 for sepsis 2/2 Klebsiella ornithinolytica s/p cefepime>Augmentinx2wk: presenting with leukocytosis and tachycardia.  Previously port felt to be infected and there is plan to remove it.  BC ID with Enterobacter aerogenes C/S pending-discussed with ID pharmacy start vancomycin continue cefepime alone.  Discussed W/ Dr Wilber Oliphant eval requested to remove port while here. Feels some improvement today..  For COVID-negative.  COVID and RSV and flu.  Monitor vital Recent Labs  Lab 06/11/23 1617 06/11/23 1718 06/11/23 1931 06/12/23 0552  WBC 12.9*  --   --  9.0  LATICACIDVEN  --  2.8* 2.8*  --   PROCALCITON  --   --   --  6.32     Hyponatremia: In the setting of poor oral intake also malignancy question SIADH, check urine studies encourage oral intake received IV fluids on admission holding for now pending urine studies    Stage IV hepatocellular carcinoma Cancer pain of the abdomen:  Recently started on sorafenib.  CT A/P shows progression of disease with increase in size of liver mass, progression of nodal metastasis, increase in size of metastatic implants  in the inferior right rectus sheath and right pelvic wall and complains of pain on lower abdomen.  Continue pain control,holding sorafenib.  Dr [name] aware.  Discussed CT scan finding with the patient and wife   Cirrhosis  with esophageal varices: Currently compensated. per med rec patient is no longer taking Coreg.   Insulin-dependent type 2 diabetes: With uncontrolled hyperglycemia. PTA ON Levemir 15 units metformin and Jardiance.  Start Levemir 7 units, SSI.  Holding metformin and Jardiance Recent Labs  Lab 06/11/23 2208 06/12/23 0756  GLUCAP 246* 255*     History of GI bleeding/GAVE Iron deficiency anemia due to chronic blood loss: Patient denies any recent obvious bleeding.  Hemoglobin is downtrending some but no obvious bleeding noted.  Continue PPI, trend H&H.   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  Patient was previously on Eliquis which was discontinued per GI recommendation due to high risk of recurrent GI bleeding.   Chronic thrombocytopenia: Stable without obvious bleeding.  Downtrending likely from sepsis. Monitor   Hypertension: BP is stable, per med rec patient is not taking Coreg or amlodipine except for as needed.  BP stable currently   DVT prophylaxis: SCDs Start: 06/11/23 2144 Code Status:   Code Status: Full Code Family Communication: plan of care discussed with patient at bedside. Patient status is: Remains hospitalized because of severity of illness Level of care: Med-Surg   Dispo: The patient is from: Home with wife            Anticipated disposition: TBD Objective: Vitals last 24 hrs: Vitals:   06/12/23 0600 06/12/23 0739 06/12/23 0742 06/12/23 0900  BP: 110/66   136/78  Pulse: 92   94  Resp: 15   14  Temp:   99.6 F (37.6 C)   TempSrc:   Oral   SpO2: 97%   97%  Weight:  63.5 kg    Height:  5\' 10"  (1.778 m)     Weight change:   Physical Examination: General exam: alert awake,at baseline, older than stated age HEENT:Oral mucosa moist, Ear/Nose WNL grossly, PORT+ left chest non tender Respiratory system: Bilaterally clear BS,no use of accessory muscle Cardiovascular  system: S1 & S2 +, No JVD. Gastrointestinal system: Abdomen soft,NT,ND, BS+ Nervous System: Alert, awake, moving all extremities,and following commands. Extremities: LE edema neg,distal peripheral pulses palpable and warm.  Skin: No rashes,no icterus. MSK: Normal muscle bulk,tone, power   Medications reviewed:  Scheduled Meds:  dicyclomine  10 mg Oral TID AC & HS   insulin aspart  0-5 Units Subcutaneous QHS   insulin aspart  0-9 Units Subcutaneous TID WC   Continuous Infusions:  ceFEPime (MAXIPIME) IV Stopped (06/12/23 9147)    Diet Order             Diet Carb Modified Fluid consistency: Thin; Room service appropriate? Yes  Diet effective now                  Intake/Output Summary (Last 24 hours) at 06/12/2023 1003 Last data filed at 06/12/2023 0556 Gross per 24 hour  Intake 300 ml  Output 400 ml  Net -100 ml   Net IO Since Admission: -100 mL [06/12/23 1003]  Wt Readings from Last 3 Encounters:  06/12/23 63.5 kg  06/02/23 61.3 kg  04/21/23 64.7 kg     Unresulted Labs (From admission, onward)     Start     Ordered   06/11/23  2039  Blood culture (routine x 2)  BLOOD CULTURE X 2,   R     Comments: FROM PORT PLEASE    06/11/23 2039          Data Reviewed: I have personally reviewed following labs and imaging studies CBC: Recent Labs  Lab 06/11/23 1617 06/12/23 0552  WBC 12.9* 9.0  NEUTROABS 10.8*  --   HGB 10.2* 8.5*  HCT 30.1* 25.1*  MCV 91.5 91.9  PLT 78* 64*   Basic Metabolic Panel:  Recent Labs  Lab 06/11/23 1617 06/12/23 0552  NA 127* 127*  K 4.3 4.0  CL 95* 97*  CO2 23 23  GLUCOSE 220* 257*  BUN 25* 23  CREATININE 1.07 1.13  CALCIUM 8.8* 8.3*   GFR: Estimated Creatinine Clearance: 55.4 mL/min (by C-G formula based on SCr of 1.13 mg/dL). Liver Function Tests:  Recent Labs  Lab 06/11/23 1617 06/12/23 0552  AST 109* 76*  ALT 43 31  ALKPHOS 257* 204*  BILITOT 2.2* 1.7*  PROT 7.8 6.6  ALBUMIN 2.3* 1.9*   No results for input(s):  "LIPASE", "AMYLASE" in the last 168 hours. No results for input(s): "AMMONIA" in the last 168 hours. Coagulation Profile:  Recent Labs  Lab 06/11/23 1617  INR 1.3*   No results for input(s): "PROBNP" in the last 168 hours.  No results for input(s): "HGBA1C" in the last 72 hours. Recent Labs  Lab 06/11/23 2208 06/12/23 0756  GLUCAP 246* 255*   No results for input(s): "CHOL", "HDL", "LDLCALC", "TRIG", "CHOLHDL", "LDLDIRECT" in the last 72 hours. Recent Labs    06/11/23 1950  TSH 1.568   Sepsis Labs: Recent Labs  Lab 06/11/23 1718 06/11/23 1931 06/12/23 0552  PROCALCITON  --   --  6.32  LATICACIDVEN 2.8* 2.8*  --    Recent Results (from the past 240 hours)  Resp panel by RT-PCR (RSV, Flu A&B, Covid) Anterior Nasal Swab     Status: None   Collection Time: 06/11/23  4:52 PM   Specimen: Anterior Nasal Swab  Result Value Ref Range Status   SARS Coronavirus 2 by RT PCR NEGATIVE NEGATIVE Final    Comment: (NOTE) SARS-CoV-2 target nucleic acids are NOT DETECTED.  The SARS-CoV-2 RNA is generally detectable in upper respiratory specimens during the acute phase of infection. The lowest concentration of SARS-CoV-2 viral copies this assay can detect is 138 copies/mL. A negative result does not preclude SARS-Cov-2 infection and should not be used as the sole basis for treatment or other patient management decisions. A negative result may occur with  improper specimen collection/handling, submission of specimen other than nasopharyngeal swab, presence of viral mutation(s) within the areas targeted by this assay, and inadequate number of viral copies(<138 copies/mL). A negative result must be combined with clinical observations, patient history, and epidemiological information. The expected result is Negative.  Fact Sheet for Patients:  BloggerCourse.com  Fact Sheet for Healthcare Providers:  SeriousBroker.it  This test is no t  yet approved or cleared by the [place] FDA and  has been authorized for detection and/or diagnosis of SARS-CoV-2 by FDA under an Emergency Use Authorization (EUA). This EUA will remain  in effect (meaning this test can be used) for the duration of the COVID-19 declaration under Section 564(b)(1) of the Act, 21 U.S.C.section 360bbb-3(b)(1), unless the authorization is terminated  or revoked sooner.       Influenza A by PCR NEGATIVE NEGATIVE Final   Influenza B by PCR NEGATIVE NEGATIVE Final  Comment: (NOTE) The Xpert Xpress SARS-CoV-2/FLU/RSV plus assay is intended as an aid in the diagnosis of influenza from Nasopharyngeal swab specimens and should not be used as a sole basis for treatment. Nasal washings and aspirates are unacceptable for Xpert Xpress SARS-CoV-2/FLU/RSV testing.  Fact Sheet for Patients: BloggerCourse.com  Fact Sheet for Healthcare Providers: SeriousBroker.it  This test is not yet approved or cleared by the [place] FDA and has been authorized for detection and/or diagnosis of SARS-CoV-2 by FDA under an Emergency Use Authorization (EUA). This EUA will remain in effect (meaning this test can be used) for the duration of the COVID-19 declaration under Section 564(b)(1) of the Act, 21 U.S.C. section 360bbb-3(b)(1), unless the authorization is terminated or revoked.     Resp Syncytial Virus by PCR NEGATIVE NEGATIVE Final    Comment: (NOTE) Fact Sheet for Patients: BloggerCourse.com  Fact Sheet for Healthcare Providers: SeriousBroker.it  This test is not yet approved or cleared by the [place] FDA and has been authorized for detection and/or diagnosis of SARS-CoV-2 by FDA under an Emergency Use Authorization (EUA). This EUA will remain in effect (meaning this test can be used) for the duration of the COVID-19 declaration under Section 564(b)(1)  of the Act, 21 U.S.C. section 360bbb-3(b)(1), unless the authorization is terminated or revoked.  Performed at [facility], [address], [place], [place] [address]   Blood Culture (routine x 2)     Status: None (Preliminary result)   Collection Time: 06/11/23  5:08 PM   Specimen: BLOOD  Result Value Ref Range Status   Specimen Description   Final    BLOOD RIGHT ANTECUBITAL Performed at [facility], [address], [place], [place] [address]    Special Requests   Final    BOTTLES DRAWN AEROBIC AND ANAEROBIC Blood Culture adequate volume Performed at [facility], [address], [place], [place] [address]    Culture  Setup Time   Final    GRAM NEGATIVE RODS ANAEROBIC BOTTLE ONLY CRITICAL RESULT CALLED TO, READ BACK BY AND VERIFIED WITH: PHARMD A.ARLINGTON AT 6295 ON 06/12/2023 BY T.SAAD. Performed at [facility], [address] [place], [place] [address]    Culture GRAM NEGATIVE RODS  Final   Report Status PENDING  Incomplete  Blood Culture (routine x 2)     Status: None (Preliminary result)   Collection Time: 06/11/23  5:08 PM   Specimen: BLOOD  Result Value Ref Range Status   Specimen Description   Final    BLOOD LEFT ANTECUBITAL Performed at [facility], [address] [place], [place] [address]    Special Requests   Final    BOTTLES DRAWN AEROBIC AND ANAEROBIC Blood Culture adequate volume Performed at [facility], [address] [place], [place] [address]    Culture  Setup Time   Final    GRAM NEGATIVE RODS IN BOTH AEROBIC AND ANAEROBIC BOTTLES CRITICAL VALUE NOTED.  VALUE IS CONSISTENT WITH PREVIOUSLY REPORTED AND CALLED VALUE. GRAM POSITIVE COCCI AEROBIC BOTTLE ONLY Organism ID to follow Performed at [facility], [address], [place], [place] [address]    Culture GRAM NEGATIVE RODS  Final   Report Status PENDING  Incomplete  Blood Culture ID  Panel (Reflexed)     Status: Abnormal   Collection Time: 06/11/23  5:08 PM  Result Value Ref Range Status   Enterococcus faecalis NOT DETECTED NOT DETECTED Final   Enterococcus Faecium  NOT DETECTED NOT DETECTED Final   Listeria monocytogenes NOT DETECTED NOT DETECTED Final   Staphylococcus species NOT DETECTED NOT DETECTED Final   Staphylococcus aureus (BCID) NOT DETECTED NOT DETECTED Final   Staphylococcus epidermidis NOT DETECTED NOT DETECTED Final   Staphylococcus lugdunensis NOT DETECTED NOT DETECTED Final   Streptococcus species NOT DETECTED NOT DETECTED Final   Streptococcus agalactiae NOT DETECTED NOT DETECTED Final   Streptococcus pneumoniae NOT DETECTED NOT DETECTED Final   Streptococcus pyogenes NOT DETECTED NOT DETECTED Final   A.calcoaceticus-baumannii NOT DETECTED NOT DETECTED Final   Bacteroides fragilis NOT DETECTED NOT DETECTED Final   Enterobacterales DETECTED (A) NOT DETECTED Final    Comment: Enterobacterales represent a large order of gram negative bacteria, not a single organism. Refer to culture for further identification. CRITICAL RESULT CALLED TO, READ BACK BY AND VERIFIED WITH: PHARMD A.ARLINGTON AT 1610 ON 06/12/2023 BY T.SAAD.    Enterobacter cloacae complex NOT DETECTED NOT DETECTED Final   Escherichia coli NOT DETECTED NOT DETECTED Final   Klebsiella aerogenes NOT DETECTED NOT DETECTED Final   Klebsiella oxytoca NOT DETECTED NOT DETECTED Final   Klebsiella pneumoniae NOT DETECTED NOT DETECTED Final   Proteus species NOT DETECTED NOT DETECTED Final   Salmonella species NOT DETECTED NOT DETECTED Final   Serratia marcescens NOT DETECTED NOT DETECTED Final   Haemophilus influenzae NOT DETECTED NOT DETECTED Final   Neisseria meningitidis NOT DETECTED NOT DETECTED Final   Pseudomonas aeruginosa NOT DETECTED NOT DETECTED Final   Stenotrophomonas maltophilia NOT DETECTED NOT DETECTED Final   Candida albicans NOT DETECTED NOT DETECTED Final   Candida auris NOT  DETECTED NOT DETECTED Final   Candida glabrata NOT DETECTED NOT DETECTED Final   Candida krusei NOT DETECTED NOT DETECTED Final   Candida parapsilosis NOT DETECTED NOT DETECTED Final   Candida tropicalis NOT DETECTED NOT DETECTED Final   Cryptococcus neoformans/gattii NOT DETECTED NOT DETECTED Final   CTX-M ESBL NOT DETECTED NOT DETECTED Final   Carbapenem resistance IMP NOT DETECTED NOT DETECTED Final   Carbapenem resistance KPC NOT DETECTED NOT DETECTED Final   Carbapenem resistance NDM NOT DETECTED NOT DETECTED Final   Carbapenem resist OXA 48 LIKE NOT DETECTED NOT DETECTED Final   Carbapenem resistance VIM NOT DETECTED NOT DETECTED Final    Comment: Performed at [facility], [address] [place], [place] [address]    Antimicrobials/Microbiology: Anti-infectives (From admission, onward)    Start     Dose/Rate Route Frequency Ordered Stop   06/12/23 1800  vancomycin (VANCOCIN) IVPB 1000 mg/200 mL premix  Status:  Discontinued        1,000 mg 200 mL/hr over 60 Minutes Intravenous Every 24 hours 06/11/23 2232 06/12/23 0946   06/12/23 0900  ceFEPIme (MAXIPIME) 2 g in sodium chloride 0.9 % 100 mL IVPB        2 g 200 mL/hr over 30 Minutes Intravenous Every 12 hours 06/11/23 2232     06/11/23 2200  metroNIDAZOLE (FLAGYL) IVPB 500 mg  Status:  Discontinued        500 mg 100 mL/hr over 60 Minutes Intravenous Every 12 hours 06/11/23 2145 06/12/23 0946   06/11/23 2130  vancomycin (VANCOREADY) IVPB 1250 mg/250 mL        1,250 mg 166.7 mL/hr over 90 Minutes Intravenous  Once 06/11/23 2046 06/11/23 2316   06/11/23 2100  ceFEPIme (MAXIPIME) 2 g in sodium chloride 0.9 % 100 mL IVPB        2 g 200 mL/hr over  30 Minutes Intravenous  Once 06/11/23 2046 06/11/23 2135         Component Value Date/Time   SDES  06/11/2023 1708    BLOOD RIGHT ANTECUBITAL Performed at [facility], [address], [place], [place] [address]    SDES  06/11/2023 1708    BLOOD LEFT  ANTECUBITAL Performed at [facility], [address] [place], [place] [address]    SPECREQUEST  06/11/2023 1708    BOTTLES DRAWN AEROBIC AND ANAEROBIC Blood Culture adequate volume Performed at [facility], [address] [place], [place] [address]    SPECREQUEST  06/11/2023 1708    BOTTLES DRAWN AEROBIC AND ANAEROBIC Blood Culture adequate volume Performed at [facility], [address], [place], [place] [address]    Elpidio Anis NEGATIVE RODS 06/11/2023 1708   CULT GRAM NEGATIVE RODS 06/11/2023 1708   REPTSTATUS PENDING 06/11/2023 1708   REPTSTATUS PENDING 06/11/2023 1708     Radiology Studies: CT ABDOMEN PELVIS W CONTRAST Result Date: 06/11/2023 CLINICAL DATA:  Abdominal pain and swelling. History of metastatic hepatocellular carcinoma. EXAM: CT ABDOMEN AND PELVIS WITH CONTRAST TECHNIQUE: Multidetector CT imaging of the abdomen and pelvis was performed using the standard protocol following bolus administration of intravenous contrast. RADIATION DOSE REDUCTION: This exam was performed according to the departmental dose-optimization program which includes automated exposure control, adjustment of the mA and/or kV according to patient size and/or use of iterative reconstruction technique. CONTRAST:  OMNIPAQUE IOHEXOL 300 MG/ML  SOLN COMPARISON:  CT of the abdomen pelvis dated 04/21/2023. FINDINGS: Lower chest: Bibasilar linear atelectasis/scarring. Retained metallic pellets noted in the left lung base. Partially visualized central venous line with tip in the right atrium. Calcified hilar and mediastinal granuloma. No intra-abdominal free air.  Trace perihepatic free fluid. Hepatobiliary: Cirrhosis. Interval increase in the size of the mass centered in the left lobe of the liver measuring approximately 6 x 9 cm (previously 5 x 9 cm). There is dilatation of the common bile duct and central biliary tree. No calcified gallstone.  Pancreas: No active inflammatory changes. No dilatation of the main pancreatic duct. Spleen: Normal in size without focal abnormality. Adrenals/Urinary Tract: The adrenal glands are poorly visualized. Mild fullness of the left renal collecting system likely due to compression of the left ureter by the retroperitoneal mass. There is symmetric enhancement and excretion of contrast by both kidneys. The urinary bladder is grossly unremarkable. Stomach/Bowel: There is similar appearance of thickening of the wall of the distal stomach. There is no bowel obstruction. The appendix is normal. Vascular/Lymphatic: Moderate aortoiliac atherosclerotic disease. The origins of the celiac axis, SMA, and IMA appear patent. The renal arteries are patent. The IVC is unremarkable. There is high-grade narrowing with near complete occlusion of the main portal vein due to compression by surrounding mass/adenopathy. There is cavernous transformation and upper abdominal varices. Interval increase in the retroperitoneal adenopathy since the prior CT. A conglomerate of retroperitoneal lymph node to the left of the aorta measures 7.8 x 6.1 cm in greatest axial dimensions (previously approximately 7 x 5 cm). There is invasion into the left psoas muscle. A retrocaval adenopathy measures approximately 2.5 x 3.0 cm (previously 2.1 x 2.1 cm). There is associated mass effect and compression of the IVC. Interval progression of upper abdominal and gastrohepatic adenopathy since the prior CT. A representative mass/adenopathy measures approximately 5 x 4 cm (previously 3 x 4 cm). Reproductive: The prostate and seminal vesicles are grossly unremarkable. Other:  Interval increase in the size of metastatic implants in the inferior right rectus sheath and along the right pelvic sidewall since the prior CT. Musculoskeletal: Osteopenia.  No acute osseous pathology. IMPRESSION: 1. Progression of disease with increase in the size of liver mass, and progression  of nodal metastasis. Increase in the size of the metastatic implants in the inferior right rectus sheath and right pelvic sidewall. 2. Cirrhosis. 3. 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 4. No bowel obstruction.  Normal appendix. 5.  Aortic Atherosclerosis (ICD10-I70.0). Electronically Signed   By: Elgie Collard M.D.   On: 06/11/2023 18:35   DG Chest Port 1 View Result Date: 06/11/2023 CLINICAL DATA:  Questionable sepsis. EXAM: PORTABLE CHEST 1 VIEW COMPARISON:  04/21/2023 FINDINGS: Right Port-A-Cath tip at low SVC. Midline trachea. Normal heart size. Numerous leads and wires project over the chest. No pleural effusion or pneumothorax. Radiopaque foreign objects project over the inferior left hemithorax. Calcified granuloma in the right lung base. Mild right hemidiaphragm elevation. Left base scarring. Left apical scarring. IMPRESSION: No evidence of pneumonia or explanation for sepsis. Electronically Signed   By: Jeronimo Greaves M.D.   On: 06/11/2023 17:32     LOS: 1 day   Total time spent in review of labs and imaging, patient evaluation, formulation of plan, documentation and communication with family: 50 minutes  Lanae Boast, MD  [facility]  06/12/2023, 10:03 AM

## 2023-06-12 NOTE — Progress Notes (Signed)
Troy [name]   DOB:05-17-1955   ZO#:[id number]   JWJ#:[id number]  Oncology follow up   Subjective: Patient is well-known to me, under my care for his [facility].  He has been on supportive care alone for the past 6 months.  He was admitted from our office yesterday for fever and sepsis.  He is feeling better today, with mild abdominal pain, no other new complaints.   Objective:  Vitals:   06/12/23 1147 06/12/23 1200  BP:  116/66  Pulse:  87  Resp:  14  Temp: 98.2 F (36.8 C)   SpO2:  99%    Body mass index is 20.09 kg/m.  Intake/Output Summary (Last 24 hours) at 06/12/2023 1330 Last data filed at 06/12/2023 0556 Gross per 24 hour  Intake 300 ml  Output 400 ml  Net -100 ml     Sclerae unicteric  Oropharynx clear  No peripheral adenopathy  Lungs clear -- no rales or rhonchi  Heart regular rate and rhythm  Abdomen soft, with mild tenderness in the upper abdomen  MSK no focal spinal tenderness, no peripheral edema  Neuro nonfocal    CBG (last 3)  Recent Labs    06/11/23 2208 06/12/23 0756 06/12/23 1145  GLUCAP 246* 255* 173*     Labs:  Lab Results  Component Value Date   WBC 9.0 06/12/2023   HGB 8.5 (L) 06/12/2023   HCT 25.1 (L) 06/12/2023   MCV 91.9 06/12/2023   PLT 64 (L) 06/12/2023   NEUTROABS 10.8 (H) 06/11/2023    Urine Studies No results for input(s): "UHGB", "CRYS" in the last 72 hours.  Invalid input(s): "UACOL", "UAPR", "USPG", "UPH", "UTP", "UGL", "UKET", "UBIL", "UNIT", "UROB", "ULEU", "UEPI", "UWBC", "URBC", "UBAC", "CAST", "UCOM", "BILUA"  Basic Metabolic Panel: Recent Labs  Lab 06/11/23 1617 06/12/23 0552  NA 127* 127*  K 4.3 4.0  CL 95* 97*  CO2 23 23  GLUCOSE 220* 257*  BUN 25* 23  CREATININE 1.07 1.13  CALCIUM 8.8* 8.3*   GFR Estimated Creatinine Clearance: 55.4 mL/min (by C-G formula based on SCr of 1.13 mg/dL). Liver Function Tests: Recent Labs  Lab 06/11/23 1617 06/12/23 0552  AST 109* 76*  ALT 43 31  ALKPHOS 257* 204*   BILITOT 2.2* 1.7*  PROT 7.8 6.6  ALBUMIN 2.3* 1.9*   No results for input(s): "LIPASE", "AMYLASE" in the last 168 hours. No results for input(s): "AMMONIA" in the last 168 hours. Coagulation profile Recent Labs  Lab 06/11/23 1617  INR 1.3*    CBC: Recent Labs  Lab 06/11/23 1617 06/12/23 0552  WBC 12.9* 9.0  NEUTROABS 10.8*  --   HGB 10.2* 8.5*  HCT 30.1* 25.1*  MCV 91.5 91.9  PLT 78* 64*   Cardiac Enzymes: No results for input(s): "CKTOTAL", "CKMB", "CKMBINDEX", "TROPONINI" in the last 168 hours. BNP: Invalid input(s): "POCBNP" CBG: Recent Labs  Lab 06/11/23 2208 06/12/23 0756 06/12/23 1145  GLUCAP 246* 255* 173*   D-Dimer No results for input(s): "DDIMER" in the last 72 hours. Hgb A1c No results for input(s): "HGBA1C" in the last 72 hours. Lipid Profile No results for input(s): "CHOL", "HDL", "LDLCALC", "TRIG", "CHOLHDL", "LDLDIRECT" in the last 72 hours. Thyroid function studies Recent Labs    06/11/23 1950  TSH 1.568   Anemia work up No results for input(s): "VITAMINB12", "FOLATE", "FERRITIN", "TIBC", "IRON", "RETICCTPCT" in the last 72 hours. Microbiology Recent Results (from the past 240 hours)  Resp panel by RT-PCR (RSV, Flu A&B, Covid) Anterior Nasal Swab  Status: None   Collection Time: 06/11/23  4:52 PM   Specimen: Anterior Nasal Swab  Result Value Ref Range Status   SARS Coronavirus 2 by RT PCR NEGATIVE NEGATIVE Final    Comment: (NOTE) SARS-CoV-2 target nucleic acids are NOT DETECTED.  The SARS-CoV-2 RNA is generally detectable in upper respiratory specimens during the acute phase of infection. The lowest concentration of SARS-CoV-2 viral copies this assay can detect is 138 copies/mL. A negative result does not preclude SARS-Cov-2 infection and should not be used as the sole basis for treatment or other patient management decisions. A negative result may occur with  improper specimen collection/handling, submission of specimen  other than nasopharyngeal swab, presence of viral mutation(s) within the areas targeted by this assay, and inadequate number of viral copies(<138 copies/mL). A negative result must be combined with clinical observations, patient history, and epidemiological information. The expected result is Negative.  Fact Sheet for Patients:  BloggerCourse.com  Fact Sheet for Healthcare Providers:  SeriousBroker.it  This test is no t yet approved or cleared by the [place] FDA and  has been authorized for detection and/or diagnosis of SARS-CoV-2 by FDA under an Emergency Use Authorization (EUA). This EUA will remain  in effect (meaning this test can be used) for the duration of the COVID-19 declaration under Section 564(b)(1) of the Act, 21 U.S.C.section 360bbb-3(b)(1), unless the authorization is terminated  or revoked sooner.       Influenza A by PCR NEGATIVE NEGATIVE Final   Influenza B by PCR NEGATIVE NEGATIVE Final    Comment: (NOTE) The Xpert Xpress SARS-CoV-2/FLU/RSV plus assay is intended as an aid in the diagnosis of influenza from Nasopharyngeal swab specimens and should not be used as a sole basis for treatment. Nasal washings and aspirates are unacceptable for Xpert Xpress SARS-CoV-2/FLU/RSV testing.  Fact Sheet for Patients: BloggerCourse.com  Fact Sheet for Healthcare Providers: SeriousBroker.it  This test is not yet approved or cleared by the [place] FDA and has been authorized for detection and/or diagnosis of SARS-CoV-2 by FDA under an Emergency Use Authorization (EUA). This EUA will remain in effect (meaning this test can be used) for the duration of the COVID-19 declaration under Section 564(b)(1) of the Act, 21 U.S.C. section 360bbb-3(b)(1), unless the authorization is terminated or revoked.     Resp Syncytial Virus by PCR NEGATIVE NEGATIVE Final     Comment: (NOTE) Fact Sheet for Patients: BloggerCourse.com  Fact Sheet for Healthcare Providers: SeriousBroker.it  This test is not yet approved or cleared by the [place] FDA and has been authorized for detection and/or diagnosis of SARS-CoV-2 by FDA under an Emergency Use Authorization (EUA). This EUA will remain in effect (meaning this test can be used) for the duration of the COVID-19 declaration under Section 564(b)(1) of the Act, 21 U.S.C. section 360bbb-3(b)(1), unless the authorization is terminated or revoked.  Performed at [facility], [address], [place], [place] [address]   Blood Culture (routine x 2)     Status: None (Preliminary result)   Collection Time: 06/11/23  5:08 PM   Specimen: BLOOD  Result Value Ref Range Status   Specimen Description   Final    BLOOD RIGHT ANTECUBITAL Performed at [facility], [address], [place], [place] [address]    Special Requests   Final    BOTTLES DRAWN AEROBIC AND ANAEROBIC Blood Culture adequate volume Performed at [facility], [address] [place], [place] [address]    Culture  Setup Time   Final    GRAM NEGATIVE RODS IN BOTH AEROBIC AND ANAEROBIC BOTTLES CRITICAL RESULT CALLED TO, READ BACK BY AND VERIFIED WITH: PHARMD A.ARLINGTON AT 5409 ON 06/12/2023 BY T.SAAD. Performed at [facility], [address], [place], [place] [address]    Culture GRAM NEGATIVE RODS  Final   Report Status PENDING  Incomplete  Blood Culture (routine x 2)     Status: None (Preliminary result)   Collection Time: 06/11/23  5:08 PM   Specimen: BLOOD  Result Value Ref Range Status   Specimen Description   Final    BLOOD LEFT ANTECUBITAL Performed at [facility], [address], [place], [place] [address]    Special Requests   Final    BOTTLES DRAWN AEROBIC AND ANAEROBIC Blood Culture adequate  volume Performed at [facility], [address] [place], [place] [address]    Culture  Setup Time   Final    GRAM NEGATIVE RODS IN BOTH AEROBIC AND ANAEROBIC BOTTLES CRITICAL VALUE NOTED.  VALUE IS CONSISTENT WITH PREVIOUSLY REPORTED AND CALLED VALUE. GRAM POSITIVE COCCI AEROBIC BOTTLE ONLY CRITICAL RESULT CALLED TO, READ BACK BY AND VERIFIED WITH: PHARMD A ALLINGTON [id number] AT 1125 BY CM Performed at [facility], [address], [place], [place] [address]    Culture GRAM NEGATIVE RODS  Final   Report Status PENDING  Incomplete  Blood Culture ID Panel (Reflexed)     Status: Abnormal   Collection Time: 06/11/23  5:08 PM  Result Value Ref Range Status   Enterococcus faecalis NOT DETECTED NOT DETECTED Final   Enterococcus Faecium NOT DETECTED NOT DETECTED Final   Listeria monocytogenes NOT DETECTED NOT DETECTED Final   Staphylococcus species NOT DETECTED NOT DETECTED Final   Staphylococcus aureus (BCID) NOT DETECTED NOT DETECTED Final   Staphylococcus epidermidis NOT DETECTED NOT DETECTED Final   Staphylococcus lugdunensis NOT DETECTED NOT DETECTED Final   Streptococcus species NOT DETECTED NOT DETECTED Final   Streptococcus agalactiae NOT DETECTED NOT DETECTED Final   Streptococcus pneumoniae NOT DETECTED NOT DETECTED Final   Streptococcus pyogenes NOT DETECTED NOT DETECTED Final   A.calcoaceticus-baumannii NOT DETECTED NOT DETECTED Final   Bacteroides fragilis NOT DETECTED NOT DETECTED Final   Enterobacterales DETECTED (A) NOT DETECTED Final    Comment: Enterobacterales represent a large order of gram negative bacteria, not a single organism. Refer to culture for further identification. CRITICAL RESULT CALLED TO, READ BACK BY AND VERIFIED WITH: PHARMD A.ARLINGTON AT 9528 ON 06/12/2023 BY T.SAAD.    Enterobacter cloacae complex NOT DETECTED NOT DETECTED Final   Escherichia coli NOT DETECTED NOT DETECTED Final   Klebsiella aerogenes NOT DETECTED NOT  DETECTED Final   Klebsiella oxytoca NOT DETECTED NOT DETECTED Final   Klebsiella pneumoniae NOT DETECTED NOT DETECTED Final   Proteus species NOT DETECTED NOT DETECTED Final   Salmonella species NOT DETECTED NOT DETECTED Final   Serratia marcescens NOT DETECTED NOT DETECTED Final   Haemophilus influenzae NOT DETECTED NOT DETECTED Final   Neisseria meningitidis NOT DETECTED NOT DETECTED Final   Pseudomonas aeruginosa NOT DETECTED NOT DETECTED Final   Stenotrophomonas maltophilia NOT DETECTED NOT DETECTED Final   Candida albicans NOT DETECTED NOT DETECTED Final   Candida auris NOT DETECTED NOT DETECTED Final   Candida glabrata NOT DETECTED NOT DETECTED Final   Candida krusei NOT DETECTED NOT DETECTED Final   Candida parapsilosis NOT DETECTED NOT DETECTED Final   Candida tropicalis NOT DETECTED NOT DETECTED  Final   Cryptococcus neoformans/gattii NOT DETECTED NOT DETECTED Final   CTX-M ESBL NOT DETECTED NOT DETECTED Final   Carbapenem resistance IMP NOT DETECTED NOT DETECTED Final   Carbapenem resistance KPC NOT DETECTED NOT DETECTED Final   Carbapenem resistance NDM NOT DETECTED NOT DETECTED Final   Carbapenem resist OXA 48 LIKE NOT DETECTED NOT DETECTED Final   Carbapenem resistance VIM NOT DETECTED NOT DETECTED Final    Comment: Performed at [facility], [address] [place], [place] [address]  Blood Culture ID Panel (Reflexed)     Status: Abnormal   Collection Time: 06/11/23  5:08 PM  Result Value Ref Range Status   Enterococcus faecalis NOT DETECTED NOT DETECTED Final   Enterococcus Faecium NOT DETECTED NOT DETECTED Final   Listeria monocytogenes NOT DETECTED NOT DETECTED Final   Staphylococcus species DETECTED (A) NOT DETECTED Final    Comment: CRITICAL RESULT CALLED TO, READ BACK BY AND VERIFIED WITH: PHARMD A ALLINGTON [id number] AT 1125 BY CM    Staphylococcus aureus (BCID) NOT DETECTED NOT DETECTED Final   Staphylococcus epidermidis NOT DETECTED NOT DETECTED Final    Staphylococcus lugdunensis NOT DETECTED NOT DETECTED Final   Streptococcus species NOT DETECTED NOT DETECTED Final   Streptococcus agalactiae NOT DETECTED NOT DETECTED Final   Streptococcus pneumoniae NOT DETECTED NOT DETECTED Final   Streptococcus pyogenes NOT DETECTED NOT DETECTED Final   A.calcoaceticus-baumannii NOT DETECTED NOT DETECTED Final   Bacteroides fragilis NOT DETECTED NOT DETECTED Final   Enterobacterales DETECTED (A) NOT DETECTED Final    Comment: Enterobacterales represent a large order of gram negative bacteria, not a single organism. Refer to culture for further identification. CRITICAL RESULT CALLED TO, READ BACK BY AND VERIFIED WITH: PHARMD A ALLINGTON [id number] AT 1125 BY CM    Enterobacter cloacae complex NOT DETECTED NOT DETECTED Final   Escherichia coli NOT DETECTED NOT DETECTED Final   Klebsiella aerogenes NOT DETECTED NOT DETECTED Final   Klebsiella oxytoca NOT DETECTED NOT DETECTED Final   Klebsiella pneumoniae NOT DETECTED NOT DETECTED Final   Proteus species NOT DETECTED NOT DETECTED Final   Salmonella species NOT DETECTED NOT DETECTED Final   Serratia marcescens NOT DETECTED NOT DETECTED Final   Haemophilus influenzae NOT DETECTED NOT DETECTED Final   Neisseria meningitidis NOT DETECTED NOT DETECTED Final   Pseudomonas aeruginosa NOT DETECTED NOT DETECTED Final   Stenotrophomonas maltophilia NOT DETECTED NOT DETECTED Final   Candida albicans NOT DETECTED NOT DETECTED Final   Candida auris NOT DETECTED NOT DETECTED Final   Candida glabrata NOT DETECTED NOT DETECTED Final   Candida krusei NOT DETECTED NOT DETECTED Final   Candida parapsilosis NOT DETECTED NOT DETECTED Final   Candida tropicalis NOT DETECTED NOT DETECTED Final   Cryptococcus neoformans/gattii NOT DETECTED NOT DETECTED Final   CTX-M ESBL NOT DETECTED NOT DETECTED Final   Carbapenem resistance IMP NOT DETECTED NOT DETECTED Final   Carbapenem resistance KPC NOT DETECTED NOT DETECTED Final    Carbapenem resistance NDM NOT DETECTED NOT DETECTED Final   Carbapenem resist OXA 48 LIKE NOT DETECTED NOT DETECTED Final   Carbapenem resistance VIM NOT DETECTED NOT DETECTED Final    Comment: Performed at [facility], [address], [place], [place] [address]  Blood culture (routine x 2)     Status: None (Preliminary result)   Collection Time: 06/11/23  8:45 PM   Specimen: BLOOD  Result Value Ref Range Status   Specimen Description   Final    BLOOD BLOOD  RIGHT HAND Performed at [facility], [address], [place], [place] [address]    Special Requests   Final    BOTTLES DRAWN AEROBIC AND ANAEROBIC Blood Culture results may not be optimal due to an inadequate volume of blood received in culture bottles Performed at [facility], [address] [place], [place] [address]    Culture   Final    NO GROWTH < 12 HOURS Performed at [facility], [address], [place], [place] [address]    Report Status PENDING  Incomplete  Blood culture (routine x 2)     Status: None (Preliminary result)   Collection Time: 06/11/23  9:00 PM   Specimen: BLOOD  Result Value Ref Range Status   Specimen Description   Final    BLOOD LEFT ANTECUBITAL Performed at [facility], [address] [place], [place] [address]    Special Requests   Final    BOTTLES DRAWN AEROBIC AND ANAEROBIC Blood Culture results may not be optimal due to an inadequate volume of blood received in culture bottles Performed at [facility], [address], [place], [place] [address]    Culture   Final    NO GROWTH < 12 HOURS Performed at [facility], [address], [place], [place] [address]    Report Status PENDING  Incomplete      Studies:  CT ABDOMEN PELVIS W CONTRAST Result Date: 06/11/2023 CLINICAL DATA:  Abdominal pain and swelling. History of metastatic hepatocellular carcinoma. EXAM: CT ABDOMEN AND PELVIS WITH  CONTRAST TECHNIQUE: Multidetector CT imaging of the abdomen and pelvis was performed using the standard protocol following bolus administration of intravenous contrast. RADIATION DOSE REDUCTION: This exam was performed according to the departmental dose-optimization program which includes automated exposure control, adjustment of the mA and/or kV according to patient size and/or use of iterative reconstruction technique. CONTRAST:  OMNIPAQUE IOHEXOL 300 MG/ML  SOLN COMPARISON:  CT of the abdomen pelvis dated 04/21/2023. FINDINGS: Lower chest: Bibasilar linear atelectasis/scarring. Retained metallic pellets noted in the left lung base. Partially visualized central venous line with tip in the right atrium. Calcified hilar and mediastinal granuloma. No intra-abdominal free air.  Trace perihepatic free fluid. Hepatobiliary: Cirrhosis. Interval increase in the size of the mass centered in the left lobe of the liver measuring approximately 6 x 9 cm (previously 5 x 9 cm). There is dilatation of the common bile duct and central biliary tree. No calcified gallstone. Pancreas: No active inflammatory changes. No dilatation of the main pancreatic duct. Spleen: Normal in size without focal abnormality. Adrenals/Urinary Tract: The adrenal glands are poorly visualized. Mild fullness of the left renal collecting system likely due to compression of the left ureter by the retroperitoneal mass. There is symmetric enhancement and excretion of contrast by both kidneys. The urinary bladder is grossly unremarkable. Stomach/Bowel: There is similar appearance of thickening of the wall of the distal stomach. There is no bowel obstruction. The appendix is normal. Vascular/Lymphatic: Moderate aortoiliac atherosclerotic disease. The origins of the celiac axis, SMA, and IMA appear patent. The renal arteries are patent. The IVC is unremarkable. There is high-grade narrowing with near complete occlusion of the main portal vein due to  compression by surrounding mass/adenopathy. There is cavernous transformation and upper abdominal varices. Interval increase in the retroperitoneal adenopathy since the prior CT. A conglomerate of retroperitoneal lymph node to the left of the aorta measures 7.8 x 6.1 cm in greatest  axial dimensions (previously approximately 7 x 5 cm). There is invasion into the left psoas muscle. A retrocaval adenopathy measures approximately 2.5 x 3.0 cm (previously 2.1 x 2.1 cm). There is associated mass effect and compression of the IVC. Interval progression of upper abdominal and gastrohepatic adenopathy since the prior CT. A representative mass/adenopathy measures approximately 5 x 4 cm (previously 3 x 4 cm). Reproductive: The prostate and seminal vesicles are grossly unremarkable. Other: Interval increase in the size of metastatic implants in the inferior right rectus sheath and along the right pelvic sidewall since the prior CT. Musculoskeletal: Osteopenia.  No acute osseous pathology. IMPRESSION: 1. Progression of disease with increase in the size of liver mass, and progression of nodal metastasis. Increase in the size of the metastatic implants in the inferior right rectus sheath and right pelvic sidewall. 2. Cirrhosis. 3. 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 4. No bowel obstruction.  Normal appendix. 5.  Aortic Atherosclerosis (ICD10-I70.0). Electronically Signed   By: Elgie Collard M.D.   On: 06/11/2023 18:35   DG Chest Port 1 View Result Date: 06/11/2023 CLINICAL DATA:  Questionable sepsis. EXAM: PORTABLE CHEST 1 VIEW COMPARISON:  04/21/2023 FINDINGS: Right Port-A-Cath tip at low SVC. Midline trachea. Normal heart size. Numerous leads and wires project over the chest. No pleural effusion or pneumothorax. Radiopaque foreign objects project over the inferior left hemithorax. Calcified granuloma in the right lung base. Mild right hemidiaphragm elevation. Left  base scarring. Left apical scarring. IMPRESSION: No evidence of pneumonia or explanation for sepsis. Electronically Signed   By: Jeronimo Greaves M.D.   On: 06/11/2023 17:32    Assessment: 68 y.o. 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 -During his last office visit, patient agreed to try [facility] treatment with low-dose sorafenib.  I have called in the medication to [facility] pharmacy, will hold it until his infection resolves. -I spoke with his wife [name]  -I will f/u as needed.    [name], MD 06/12/2023  1:30 PM

## 2023-06-12 NOTE — Progress Notes (Addendum)
PHARMACY - PHYSICIAN COMMUNICATION CRITICAL VALUE ALERT - BLOOD CULTURE IDENTIFICATION (BCID)  Troy [name] is an 68 y.o. male who presented to [facility] on 06/11/2023 with a chief complaint of fatigue. Recent admission in December with Klebsiella ornithinolytica bacteremia, treated with cefepime and discharged on Augmentin. Recommendation was for port removal. He was recently seen by oncology; reported fever and chills, was prescribed Augmentin. Plan was for port removal early February.  Assessment:   1/30 Bcx 2/4 (one bottle each set) Enterobacterales, pending further ID but suspect may be Klebsiella ornithinolytica again. These cultures were drawn peripherally from right and left arms. Pt also has 2 sets of blood cultures from port, which are still pending.  Concern for recurrent port infection, current plan is to remove port this admission  Update to Bcx (peripheral cx): 4/4 Enterobacterales, also with 1/4 staph species - suspect staph species may be contaminant, but will cover for now  Name of physician (or Provider) Contacted: Dr. Jonathon Bellows  Current antibiotics: cefepime, vancomycin, metronidazole  Changes to prescribed antibiotics recommended: De-escalate to cefepime monotherapy Add back vancomycin for staph species for now  Results for orders placed or performed during the hospital encounter of 06/11/23  Blood Culture ID Panel (Reflexed) (Collected: 06/11/2023  5:08 PM)  Result Value Ref Range   Enterococcus faecalis NOT DETECTED NOT DETECTED   Enterococcus Faecium NOT DETECTED NOT DETECTED   Listeria monocytogenes NOT DETECTED NOT DETECTED   Staphylococcus species NOT DETECTED NOT DETECTED   Staphylococcus aureus (BCID) NOT DETECTED NOT DETECTED   Staphylococcus epidermidis NOT DETECTED NOT DETECTED   Staphylococcus lugdunensis NOT DETECTED NOT DETECTED   Streptococcus species NOT DETECTED NOT DETECTED   Streptococcus agalactiae NOT DETECTED NOT DETECTED   Streptococcus pneumoniae  NOT DETECTED NOT DETECTED   Streptococcus pyogenes NOT DETECTED NOT DETECTED   A.calcoaceticus-baumannii NOT DETECTED NOT DETECTED   Bacteroides fragilis NOT DETECTED NOT DETECTED   Enterobacterales DETECTED (A) NOT DETECTED   Enterobacter cloacae complex NOT DETECTED NOT DETECTED   Escherichia coli NOT DETECTED NOT DETECTED   Klebsiella aerogenes NOT DETECTED NOT DETECTED   Klebsiella oxytoca NOT DETECTED NOT DETECTED   Klebsiella pneumoniae NOT DETECTED NOT DETECTED   Proteus species NOT DETECTED NOT DETECTED   Salmonella species NOT DETECTED NOT DETECTED   Serratia marcescens NOT DETECTED NOT DETECTED   Haemophilus influenzae NOT DETECTED NOT DETECTED   Neisseria meningitidis NOT DETECTED NOT DETECTED   Pseudomonas aeruginosa NOT DETECTED NOT DETECTED   Stenotrophomonas maltophilia NOT DETECTED NOT DETECTED   Candida albicans NOT DETECTED NOT DETECTED   Candida auris NOT DETECTED NOT DETECTED   Candida glabrata NOT DETECTED NOT DETECTED   Candida krusei NOT DETECTED NOT DETECTED   Candida parapsilosis NOT DETECTED NOT DETECTED   Candida tropicalis NOT DETECTED NOT DETECTED   Cryptococcus neoformans/gattii NOT DETECTED NOT DETECTED   CTX-M ESBL NOT DETECTED NOT DETECTED   Carbapenem resistance IMP NOT DETECTED NOT DETECTED   Carbapenem resistance KPC NOT DETECTED NOT DETECTED   Carbapenem resistance NDM NOT DETECTED NOT DETECTED   Carbapenem resist OXA 48 LIKE NOT DETECTED NOT DETECTED   Carbapenem resistance VIM NOT DETECTED NOT DETECTED    Pricilla Riffle, PharmD, BCPS Clinical Pharmacist 06/12/2023 9:52 AM

## 2023-06-12 NOTE — Progress Notes (Addendum)
Patient ID: Troy [name], male   DOB: [birth year], 68 y.o.   MRN: [id number] Pt scheduled for port a cath removal today. Originally placed by IR team in 2023. Hx HCC. He was on IR schedule next week as outpatient for port removal secondary to no longer needing port. He now presents with bacteremia. Will plan for port removal today via local anesthesia. .Risks and benefits of image guided port-a-catheter removal was discussed with the patient/spouse including, but not limited to bleeding, infection, injury to adjacent structures.   All of the patient's questions were answered, patient is agreeable to proceed. Consent signed and in IR control room

## 2023-06-12 NOTE — Plan of Care (Signed)

## 2023-06-12 NOTE — Progress Notes (Signed)
Pt arrived to 1535 for continued medical treatment from the ED. Alert and oriented on room air, ambulates independently, wife at bedside

## 2023-06-12 NOTE — Procedures (Signed)
Pre Procedural Dx: Completed chemotherapy; Bacteremia  Post Procedural Dx: Same  Successful removal of anterior chest wall port-a-cath. Port tip sent for culture analysis.   EBL: Trace  No immediate post procedural complications.   Katherina Right, MD Pager #: [phone]

## 2023-06-12 NOTE — Progress Notes (Addendum)
Pharmacy Antibiotic Note  Troy [name] is a 68 y.o. male admitted on 06/11/2023 with sepsis. Recent admission in December 2024 with Klebsiella ornithinolytica bacteremia, treated with Cefepime and discharged on Augmentin. Recommendation was for port removal. He was recently seen by oncology; reported fever and chills, was prescribed Augmentin. Plan was for port removal early February, but now IR consulted for removal on this admission due to concern for recurrent port infection. Blood cultures 4/4 bottles (peripheral at 1708) with Enterobacterales and now 1/4 (peripheral at 1708) with Staph species. Pharmacy has been consulted for Cefepime and Vancomycin dosing.  Plan: Continue Cefepime 2g IV q12h Resume Vancomycin at 1250 IV q24h to target AUC 400-550 Monitor renal function, cultures, clinical course, and for de-escalation   Height: 5\' 10"  (177.8 cm) Weight: 63.5 kg (140 lb) IBW/kg (Calculated) : 73  Temp (24hrs), Avg:99.4 F (37.4 C), Min:98.7 F (37.1 C), Max:100.1 F (37.8 C)  Recent Labs  Lab 06/11/23 1617 06/11/23 1718 06/11/23 1931 06/12/23 0552  WBC 12.9*  --   --  9.0  CREATININE 1.07  --   --  1.13  LATICACIDVEN  --  2.8* 2.8*  --     Estimated Creatinine Clearance: 55.4 mL/min (by C-G formula based on SCr of 1.13 mg/dL).    No Known Allergies  Antimicrobials this admission: 1/30 Cefepime >>  1/30 Vancomycin >>  1/30 Metronidazole >> 1/31  Microbiology results: 1/30 BCx (x2, left AC, right AC): 4/4 bottles Enterobacterales, 1/4 with Staph species 1/30 BCx (x2, left AC, right hand): ngtd  1/30 Resp PCR: negative  Thank you for allowing pharmacy to be a part of this patient's care.   Greer Pickerel, PharmD, BCPS Clinical Pharmacist 06/12/2023 11:42 AM

## 2023-06-12 NOTE — Plan of Care (Signed)
Problem: Education: Goal: Ability to describe self-care measures that may prevent or decrease complications (Diabetes Survival Skills Education) will improve 06/12/2023 2240 by Elder Cyphers, RN Outcome: Progressing 06/12/2023 2237 by Elder Cyphers, RN Outcome: Progressing Goal: Individualized Educational Video(s) 06/12/2023 2240 by Elder Cyphers, RN Outcome: Progressing 06/12/2023 2237 by Elder Cyphers, RN Outcome: Progressing   Problem: Coping: Goal: Ability to adjust to condition or change in health will improve 06/12/2023 2240 by Elder Cyphers, RN Outcome: Progressing 06/12/2023 2237 by Elder Cyphers, RN Outcome: Progressing   Problem: Fluid Volume: Goal: Ability to maintain a balanced intake and output will improve 06/12/2023 2240 by Elder Cyphers, RN Outcome: Progressing 06/12/2023 2237 by Elder Cyphers, RN Outcome: Progressing   Problem: Health Behavior/Discharge Planning: Goal: Ability to identify and utilize available resources and services will improve 06/12/2023 2240 by Elder Cyphers, RN Outcome: Progressing 06/12/2023 2237 by Elder Cyphers, RN Outcome: Progressing Goal: Ability to manage health-related needs will improve 06/12/2023 2240 by Elder Cyphers, RN Outcome: Progressing 06/12/2023 2237 by Elder Cyphers, RN Outcome: Progressing   Problem: Metabolic: Goal: Ability to maintain appropriate glucose levels will improve 06/12/2023 2240 by Elder Cyphers, RN Outcome: Progressing 06/12/2023 2237 by Elder Cyphers, RN Outcome: Progressing   Problem: Nutritional: Goal: Maintenance of adequate nutrition will improve 06/12/2023 2240 by Elder Cyphers, RN Outcome: Progressing 06/12/2023 2237 by Elder Cyphers, RN Outcome: Progressing Goal: Progress toward achieving an optimal weight will improve 06/12/2023 2240 by Elder Cyphers, RN Outcome: Progressing 06/12/2023 2237 by Elder Cyphers, RN Outcome: Progressing   Problem:  Skin Integrity: Goal: Risk for impaired skin integrity will decrease 06/12/2023 2240 by Elder Cyphers, RN Outcome: Progressing 06/12/2023 2237 by Elder Cyphers, RN Outcome: Progressing   Problem: Tissue Perfusion: Goal: Adequacy of tissue perfusion will improve 06/12/2023 2240 by Elder Cyphers, RN Outcome: Progressing 06/12/2023 2237 by Elder Cyphers, RN Outcome: Progressing   Problem: Fluid Volume: Goal: Hemodynamic stability will improve 06/12/2023 2240 by Elder Cyphers, RN Outcome: Progressing 06/12/2023 2237 by Elder Cyphers, RN Outcome: Progressing   Problem: Clinical Measurements: Goal: Diagnostic test results will improve 06/12/2023 2240 by Elder Cyphers, RN Outcome: Progressing 06/12/2023 2237 by Elder Cyphers, RN Outcome: Progressing Goal: Signs and symptoms of infection will decrease 06/12/2023 2240 by Elder Cyphers, RN Outcome: Progressing 06/12/2023 2237 by Elder Cyphers, RN Outcome: Progressing   Problem: Respiratory: Goal: Ability to maintain adequate ventilation will improve 06/12/2023 2240 by Elder Cyphers, RN Outcome: Progressing 06/12/2023 2237 by Elder Cyphers, RN Outcome: Progressing   Problem: Education: Goal: Knowledge of General Education information will improve Description: Including pain rating scale, medication(s)/side effects and non-pharmacologic comfort measures 06/12/2023 2240 by Elder Cyphers, RN Outcome: Progressing 06/12/2023 2237 by Elder Cyphers, RN Outcome: Progressing   Problem: Health Behavior/Discharge Planning: Goal: Ability to manage health-related needs will improve 06/12/2023 2240 by Elder Cyphers, RN Outcome: Progressing 06/12/2023 2237 by Elder Cyphers, RN Outcome: Progressing   Problem: Clinical Measurements: Goal: Ability to maintain clinical measurements within normal limits will improve 06/12/2023 2240 by Elder Cyphers, RN Outcome: Progressing 06/12/2023 2237 by Elder Cyphers, RN Outcome: Progressing Goal: Will remain free from infection 06/12/2023 2240 by Elder Cyphers, RN Outcome: Progressing 06/12/2023 2237 by Elder Cyphers, RN Outcome: Progressing Goal: Diagnostic test results will improve 06/12/2023 2240 by Elder Cyphers, RN Outcome: Progressing 06/12/2023 2237 by Elder Cyphers, RN Outcome: Progressing Goal: Respiratory complications will improve 06/12/2023 2240 by Elder Cyphers, RN Outcome: Progressing 06/12/2023 2237 by Elder Cyphers, RN Outcome: Progressing Goal: Cardiovascular complication will be avoided 06/12/2023 2240 by Elder Cyphers,  RN Outcome: Progressing 06/12/2023 2237 by Elder Cyphers, RN Outcome: Progressing   Problem: Activity: Goal: Risk for activity intolerance will decrease 06/12/2023 2240 by Elder Cyphers, RN Outcome: Progressing 06/12/2023 2237 by Elder Cyphers, RN Outcome: Progressing   Problem: Nutrition: Goal: Adequate nutrition will be maintained 06/12/2023 2240 by Elder Cyphers, RN Outcome: Progressing 06/12/2023 2237 by Elder Cyphers, RN Outcome: Progressing   Problem: Coping: Goal: Level of anxiety will decrease 06/12/2023 2240 by Elder Cyphers, RN Outcome: Progressing 06/12/2023 2237 by Elder Cyphers, RN Outcome: Progressing   Problem: Elimination: Goal: Will not experience complications related to bowel motility 06/12/2023 2240 by Elder Cyphers, RN Outcome: Progressing 06/12/2023 2237 by Elder Cyphers, RN Outcome: Progressing Goal: Will not experience complications related to urinary retention 06/12/2023 2240 by Elder Cyphers, RN Outcome: Progressing 06/12/2023 2237 by Elder Cyphers, RN Outcome: Progressing   Problem: Pain Managment: Goal: General experience of comfort will improve and/or be controlled 06/12/2023 2240 by Elder Cyphers, RN Outcome: Progressing 06/12/2023 2237 by Elder Cyphers, RN Outcome: Progressing   Problem: Safety: Goal:  Ability to remain free from injury will improve 06/12/2023 2240 by Elder Cyphers, RN Outcome: Progressing 06/12/2023 2237 by Elder Cyphers, RN Outcome: Progressing   Problem: Skin Integrity: Goal: Risk for impaired skin integrity will decrease 06/12/2023 2240 by Elder Cyphers, RN Outcome: Progressing 06/12/2023 2237 by Elder Cyphers, RN Outcome: Progressing

## 2023-06-12 NOTE — Progress Notes (Signed)
 Pt off floor to IR.

## 2023-06-13 DIAGNOSIS — R6510 Systemic inflammatory response syndrome (SIRS) of non-infectious origin without acute organ dysfunction: Secondary | ICD-10-CM | POA: Diagnosis not present

## 2023-06-13 LAB — CBC
HCT: 25.3 % — ABNORMAL LOW (ref 39.0–52.0)
Hemoglobin: 8.4 g/dL — ABNORMAL LOW (ref 13.0–17.0)
MCH: 30.5 pg (ref 26.0–34.0)
MCHC: 33.2 g/dL (ref 30.0–36.0)
MCV: 92 fL (ref 80.0–100.0)
Platelets: 69 10*3/uL — ABNORMAL LOW (ref 150–400)
RBC: 2.75 MIL/uL — ABNORMAL LOW (ref 4.22–5.81)
RDW: 14.3 % (ref 11.5–15.5)
WBC: 7.9 10*3/uL (ref 4.0–10.5)
nRBC: 0 % (ref 0.0–0.2)

## 2023-06-13 LAB — BASIC METABOLIC PANEL
Anion gap: 6 (ref 5–15)
BUN: 20 mg/dL (ref 8–23)
CO2: 24 mmol/L (ref 22–32)
Calcium: 8.4 mg/dL — ABNORMAL LOW (ref 8.9–10.3)
Chloride: 100 mmol/L (ref 98–111)
Creatinine, Ser: 1.01 mg/dL (ref 0.61–1.24)
GFR, Estimated: >60 mL/min (ref >60)
Glucose, Bld: 195 mg/dL — ABNORMAL HIGH (ref 70–99)
Potassium: 4 mmol/L (ref 3.5–5.1)
Sodium: 130 mmol/L — ABNORMAL LOW (ref 135–145)

## 2023-06-13 LAB — GLUCOSE, CAPILLARY
Glucose-Capillary: 193 mg/dL — ABNORMAL HIGH (ref 70–99)
Glucose-Capillary: 204 mg/dL — ABNORMAL HIGH (ref 70–99)
Glucose-Capillary: 233 mg/dL — ABNORMAL HIGH (ref 70–99)
Glucose-Capillary: 257 mg/dL — ABNORMAL HIGH (ref 70–99)

## 2023-06-13 MED ORDER — INSULIN GLARGINE-YFGN 100 UNIT/ML ~~LOC~~ SOLN
15.0000 [IU] | Freq: Every day | SUBCUTANEOUS | Status: DC
  Administered 2023-06-14 – 2023-06-20 (×6): 15 [IU] via SUBCUTANEOUS
  Filled 2023-06-13 (×7): qty 0.15

## 2023-06-13 MED ORDER — SODIUM CHLORIDE 0.9 % IV SOLN
2.0000 g | Freq: Three times a day (TID) | INTRAVENOUS | Status: DC
  Administered 2023-06-13 – 2023-06-15 (×5): 2 g via INTRAVENOUS
  Filled 2023-06-13 (×5): qty 12.5

## 2023-06-13 NOTE — Plan of Care (Signed)

## 2023-06-13 NOTE — Progress Notes (Signed)
[facility] PROGRESS NOTE  Troy [name] (DOB: 09-Apr-1956) WUJ:[id number] PCP: [name], MD  Brief Narrative: Troy [name] is a 68 y.o. 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 He had low grade fever, tachycardia, leukocytosis (WBC 12.9k), lactic acid elevation (2.8 x2). CT abd/pelvis showed progression of disease with increase in the size of liver mass, progression of nodal metastasis. Increase in the size of the metastatic implants in the inferior right rectus sheath and right pelvic sidewall. 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   He was admitted, started on IV antibiotics after blood cultures were drawn and ultimately found to have Enterobacterales spp bacteremia. Port was removed 1/31.   Subjective: No new complaints, feeling better generally.   Objective: BP 111/71 (BP Location: Right Arm)   Pulse 87   Temp 99.7 F (37.6 C) (Oral)   Resp 18   Ht 5\' 10"  (1.778 m)   Wt 63.5 kg   SpO2 98%   BMI 20.09 kg/m   Gen: No distress, thin Pulm: Clear, nonlabored  CV: RRR, no MRG or edema GI: Soft, no significant tenderness, ND, +BS  Neuro: Alert and oriented. No new focal deficits. Ext: Warm, no deformities. 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 Improved after cefepime then augmentin therapy previously:   - Monitoring cultures for formal report, currently GNRs on gram stain of 4 of 4 collections 1/30, and rapid ID Enterobacterales (NOS). GPC in 1 of 4 also noted, rapid ID with Staph spp. (NOS but non-aureus). We will continue vancomycin and cefepime for now.  - s/p port removal 1/31, tip culture reincubated.      Hyponatremia: Likely related to cirrhosis, possibly also SIADH, though this is improving and mild.  - Will aim to maximize po intake by not restricting diet at this time.   Stage IV hepatocellular carcinoma:  - Holding recently started sorafenib for now per Dr. [name]   Cirrhosis with esophageal varices:  - Would ideally be on nonselective beta blocker, though no longer taking coreg.     IDT2DM: Recent HbA1c 8.4%, uncontrolled hyperglycemia.  - Continue levemir (increase back to home dose 15u) and SSI - Hold metformin and jardiance for now.      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  - Holding anticoagulation due to severely elevated bleeding risk.  - Pt is on PPI - Monitor H/H  Chronic portal vein thrombosis: CT showed occlusion of the main portal vein with cavernous transformation and upper abdominal varices.   - Eliquis was stopped per GI recommendation due to high risk of recurrent GI bleeding.   Chronic thrombocytopenia: Slightly lower than previous baseline, due to sepsis. No bleeding.  - Continue monitoring.    HTN: Normotensive off medications.   Lactic acid elevation: Due to sepsis and reduced hepatic clearance.   Tyrone Nine, MD [facility] [email or web address] 06/13/2023, 1:54 PM

## 2023-06-13 NOTE — Progress Notes (Signed)
Pharmacy Antibiotic Note  Troy [name] is a 69 y.o. male admitted on 06/11/2023 with sepsis. Recent admission in December 2024 with Klebsiella ornithinolytica bacteremia, treated with Cefepime and discharged on Augmentin. Recommendation was for port removal. He was recently seen by oncology; reported fever and chills, was prescribed Augmentin. Plan was for port removal early February, but now IR consulted for removal on this admission due to concern for recurrent port infection. Blood cultures 4/4 bottles (peripheral at 1708) with Enterobacterales and now 1/4 (peripheral at 1708) with Staph species. Pharmacy has been consulted for Cefepime and Vancomycin dosing.  Today, CrCl is up to 62 ml/min   Plan: Increase  Cefepime to 2 gr IV q8h  Resume Vancomycin at 1250 IV q24h to target AUC 400-550 Monitor renal function, cultures, clinical course, and for de-escalation   Height: 5\' 10"  (177.8 cm) Weight: 63.5 kg (140 lb) IBW/kg (Calculated) : 73  Temp (24hrs), Avg:99.1 F (37.3 C), Min:98.1 F (36.7 C), Max:100.1 F (37.8 C)  Recent Labs  Lab 06/11/23 1617 06/11/23 1718 06/11/23 1931 06/12/23 0552 06/13/23 0454  WBC 12.9*  --   --  9.0 7.9  CREATININE 1.07  --   --  1.13 1.01  LATICACIDVEN  --  2.8* 2.8*  --   --     Estimated Creatinine Clearance: 62 mL/min (by C-G formula based on SCr of 1.01 mg/dL).    No Known Allergies  Antimicrobials this admission: 1/30 Cefepime >>  1/30 Vancomycin >>  1/30 Metronidazole >> 1/31  Microbiology results: 1/30 BCx (x2, left AC, right AC): 4/4 bottles Enterobacterales, 1/4 with Staph species 1/30 BCx (x2, left AC, right hand): ngtd  1/30 Resp PCR: negative   Adalberto Cole, PharmD, BCPS 06/13/2023 2:24 PM

## 2023-06-14 DIAGNOSIS — R6510 Systemic inflammatory response syndrome (SIRS) of non-infectious origin without acute organ dysfunction: Secondary | ICD-10-CM | POA: Diagnosis not present

## 2023-06-14 LAB — CATH TIP CULTURE

## 2023-06-14 LAB — BASIC METABOLIC PANEL
Anion gap: 6 (ref 5–15)
BUN: 14 mg/dL (ref 8–23)
CO2: 23 mmol/L (ref 22–32)
Calcium: 8.6 mg/dL — ABNORMAL LOW (ref 8.9–10.3)
Chloride: 102 mmol/L (ref 98–111)
Creatinine, Ser: 0.76 mg/dL (ref 0.61–1.24)
GFR, Estimated: >60 mL/min (ref >60)
Glucose, Bld: 169 mg/dL — ABNORMAL HIGH (ref 70–99)
Potassium: 3.6 mmol/L (ref 3.5–5.1)
Sodium: 131 mmol/L — ABNORMAL LOW (ref 135–145)

## 2023-06-14 LAB — GLUCOSE, CAPILLARY
Glucose-Capillary: 155 mg/dL — ABNORMAL HIGH (ref 70–99)
Glucose-Capillary: 232 mg/dL — ABNORMAL HIGH (ref 70–99)
Glucose-Capillary: 164 mg/dL — ABNORMAL HIGH (ref 70–99)
Glucose-Capillary: 232 mg/dL — ABNORMAL HIGH (ref 70–99)

## 2023-06-14 LAB — CULTURE, BLOOD (ROUTINE X 2)
Culture: NO GROWTH — AB
Special Requests: ADEQUATE

## 2023-06-14 LAB — CBC
HCT: 26.2 % — ABNORMAL LOW (ref 39.0–52.0)
Hemoglobin: 8.7 g/dL — ABNORMAL LOW (ref 13.0–17.0)
MCH: 30.4 pg (ref 26.0–34.0)
MCHC: 33.2 g/dL (ref 30.0–36.0)
MCV: 91.6 fL (ref 80.0–100.0)
Platelets: 92 10*3/uL — ABNORMAL LOW (ref 150–400)
RBC: 2.86 MIL/uL — ABNORMAL LOW (ref 4.22–5.81)
RDW: 14.4 % (ref 11.5–15.5)
WBC: 7.9 10*3/uL (ref 4.0–10.5)
nRBC: 0 % (ref 0.0–0.2)

## 2023-06-14 MED ORDER — METHOCARBAMOL 500 MG PO TABS
500.0000 mg | ORAL_TABLET | Freq: Three times a day (TID) | ORAL | Status: DC | PRN
  Administered 2023-06-14 – 2023-06-15 (×2): 500 mg via ORAL
  Filled 2023-06-14 (×2): qty 1

## 2023-06-14 MED ORDER — VANCOMYCIN HCL 1500 MG/300ML IV SOLN
1500.0000 mg | INTRAVENOUS | Status: DC
  Administered 2023-06-14: 1500 mg via INTRAVENOUS
  Filled 2023-06-14: qty 300

## 2023-06-14 NOTE — Progress Notes (Signed)
Pharmacy Antibiotic Note  Troy [name] is a 68 y.o. male admitted on 06/11/2023 with sepsis. Recent admission in December 2024 with Klebsiella ornithinolytica bacteremia, treated with Cefepime and discharged on Augmentin. Recommendation was for port removal. He was recently seen by oncology; reported fever and chills, was prescribed Augmentin. Plan was for port removal early February, but now IR consulted for removal on this admission due to concern for recurrent port infection. Blood cultures 4/4 bottles (peripheral at 1708) with Enterobacterales and now 1/4 (peripheral at 1708) with Staph species. Pharmacy has been consulted for Cefepime and Vancomycin dosing.  Today, SCr improved to 0.76 and est CrCl 78 ml/min  Plan: Continue  Cefepime to 2 gr IV q8h  Change vancomycin to 1500 mg IV q24h (Goal AUC 400-550, eAUC 473, SCr used: rounded up to 0.8, TBW) Monitor clinical progress, renal function, vancomycin levels as indicated F/U C&S, abx deescalation / LOT   Height: 5\' 10"  (177.8 cm) Weight: 63.5 kg (140 lb) IBW/kg (Calculated) : 73  Temp (24hrs), Avg:98.5 F (36.9 C), Min:98.1 F (36.7 C), Max:98.9 F (37.2 C)  Recent Labs  Lab 06/11/23 1617 06/11/23 1718 06/11/23 1931 06/12/23 0552 06/13/23 0454 06/14/23 0439  WBC 12.9*  --   --  9.0 7.9 7.9  CREATININE 1.07  --   --  1.13 1.01 0.76  LATICACIDVEN  --  2.8* 2.8*  --   --   --     Estimated Creatinine Clearance: 78.3 mL/min (by C-G formula based on SCr of 0.76 mg/dL).    No Known Allergies  Antimicrobials this admission: 1/30 Cefepime >>  1/30 Vancomycin >>  1/30 Metronidazole >> 1/31  Microbiology results: 1/30 BCx (x2, left AC, right AC): 4/4 bottles Enterobacterales, 1/4 with Staph species 1/30 BCx (x2, left AC, right hand): ngtd  1/30 Resp PCR: negative   Thank you for allowing pharmacy to be a part of this patient's care.  Selinda Eon, PharmD, BCPS Clinical Pharmacist [place] 06/14/2023 2:08 PM

## 2023-06-14 NOTE — Plan of Care (Signed)

## 2023-06-14 NOTE — Progress Notes (Signed)
[facility] PROGRESS NOTE  Will Troy [name] (DOB: May 29, 1955) WUJ:[id number] PCP: [name], MD  Brief Narrative: Troy [name] is a 68 y.o. 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 He had low grade fever, tachycardia, leukocytosis (WBC 12.9k), lactic acid elevation (2.8 x2). CT abd/pelvis showed progression of disease with increase in the size of liver mass, progression of nodal metastasis. Increase in the size of the metastatic implants in the inferior right rectus sheath and right pelvic sidewall. 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   He was admitted, started on IV antibiotics after blood cultures were drawn and ultimately found to have Enterobacterales spp bacteremia. Port was removed 1/31.   Subjective: Has chronic left lower abdominal discomfort otherwise having a good day, no fevers any more and no other complaints.   Objective: BP (!) 145/73 (BP Location: Left Arm)   Pulse 95   Temp 98.1 F (36.7 C)   Resp 16   Ht 5\' 10"  (1.778 m)   Wt 63.5 kg   SpO2 100%   BMI 20.09 kg/m   Gen: No distress, very pleasant Pulm: Nonlabored  CV: RRR, no edema GI: Soft, no visible or palpable abnormality on LLQ. Midline abdominal incision scar noted.  Neuro: Alert and oriented. No new focal deficits. Ext: Warm, no deformities. Skin: Port site dressing is c/d/i.   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 Improved after cefepime then augmentin therapy previously:  -  Monitoring cultures for susceptibility data. Will enlist ID formally once susceptibilities have returned. - GPC in 1 of 4 also noted, rapid ID with Staph spp. (NOS but non-aureus).  - We will continue vancomycin and cefepime for now.  - s/p port removal 1/31, tip culture reincubated.      Hyponatremia: Likely related to cirrhosis, possibly also SIADH, though this is improving and mild.  - Will aim to maximize po intake by not restricting diet at this time.   Stage IV hepatocellular carcinoma:  - Holding recently started sorafenib for now per Dr. [name]   Cirrhosis with esophageal varices:  - Would ideally be on nonselective beta blocker, though no longer taking coreg.     IDT2DM: Recent HbA1c 8.4%, uncontrolled hyperglycemia.  - Continue levemir (increase back to home dose 15u) and SSI - Hold metformin and jardiance for now.      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  - Holding anticoagulation due to severely elevated bleeding risk.  - Pt is on PPI - Monitor H/H intermittently. Stable thus far.  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   - Eliquis was stopped per GI recommendation due to high risk of recurrent GI bleeding.   Chronic thrombocytopenia: Slightly lower than previous baseline, due to sepsis. No bleeding.  - Continue monitoring.    HTN: Normotensive off medications.   Lactic acid elevation: Due to sepsis and reduced hepatic clearance.   Troy Nine, MD [facility] [email or web address] 06/14/2023, 1:08 PM

## 2023-06-15 DIAGNOSIS — I8510 Secondary esophageal varices without bleeding: Secondary | ICD-10-CM

## 2023-06-15 DIAGNOSIS — C220 Liver cell carcinoma: Secondary | ICD-10-CM

## 2023-06-15 DIAGNOSIS — B961 Klebsiella pneumoniae [K. pneumoniae] as the cause of diseases classified elsewhere: Secondary | ICD-10-CM | POA: Diagnosis present

## 2023-06-15 DIAGNOSIS — E119 Type 2 diabetes mellitus without complications: Secondary | ICD-10-CM

## 2023-06-15 DIAGNOSIS — T80212A Local infection due to central venous catheter, initial encounter: Secondary | ICD-10-CM | POA: Diagnosis present

## 2023-06-15 DIAGNOSIS — B182 Chronic viral hepatitis C: Secondary | ICD-10-CM

## 2023-06-15 DIAGNOSIS — R7881 Bacteremia: Secondary | ICD-10-CM

## 2023-06-15 DIAGNOSIS — K7460 Unspecified cirrhosis of liver: Secondary | ICD-10-CM

## 2023-06-15 DIAGNOSIS — R6510 Systemic inflammatory response syndrome (SIRS) of non-infectious origin without acute organ dysfunction: Secondary | ICD-10-CM | POA: Diagnosis not present

## 2023-06-15 DIAGNOSIS — G893 Neoplasm related pain (acute) (chronic): Secondary | ICD-10-CM

## 2023-06-15 LAB — GLUCOSE, CAPILLARY
Glucose-Capillary: 167 mg/dL — ABNORMAL HIGH (ref 70–99)
Glucose-Capillary: 221 mg/dL — ABNORMAL HIGH (ref 70–99)
Glucose-Capillary: 172 mg/dL — ABNORMAL HIGH (ref 70–99)
Glucose-Capillary: 152 mg/dL — ABNORMAL HIGH (ref 70–99)

## 2023-06-15 LAB — CBC
HCT: 26 % — ABNORMAL LOW (ref 39.0–52.0)
Hemoglobin: 8.6 g/dL — ABNORMAL LOW (ref 13.0–17.0)
MCH: 30.4 pg (ref 26.0–34.0)
MCHC: 33.1 g/dL (ref 30.0–36.0)
MCV: 91.9 fL (ref 80.0–100.0)
Platelets: 96 10*3/uL — ABNORMAL LOW (ref 150–400)
RBC: 2.83 MIL/uL — ABNORMAL LOW (ref 4.22–5.81)
RDW: 14.3 % (ref 11.5–15.5)
WBC: 5.9 10*3/uL (ref 4.0–10.5)
nRBC: 0 % (ref 0.0–0.2)

## 2023-06-15 LAB — BASIC METABOLIC PANEL
Anion gap: 7 (ref 5–15)
BUN: 12 mg/dL (ref 8–23)
CO2: 23 mmol/L (ref 22–32)
Calcium: 8.6 mg/dL — ABNORMAL LOW (ref 8.9–10.3)
Chloride: 104 mmol/L (ref 98–111)
Creatinine, Ser: 0.9 mg/dL (ref 0.61–1.24)
GFR, Estimated: >60 mL/min (ref >60)
Glucose, Bld: 178 mg/dL — ABNORMAL HIGH (ref 70–99)
Potassium: 3.4 mmol/L — ABNORMAL LOW (ref 3.5–5.1)
Sodium: 134 mmol/L — ABNORMAL LOW (ref 135–145)

## 2023-06-15 LAB — CULTURE, BLOOD (ROUTINE X 2): Special Requests: ADEQUATE

## 2023-06-15 MED ORDER — POTASSIUM CHLORIDE CRYS ER 20 MEQ PO TBCR
20.0000 meq | EXTENDED_RELEASE_TABLET | Freq: Once | ORAL | Status: AC
  Administered 2023-06-15: 20 meq via ORAL
  Filled 2023-06-15: qty 1

## 2023-06-15 MED ORDER — METHOCARBAMOL 500 MG PO TABS
500.0000 mg | ORAL_TABLET | Freq: Three times a day (TID) | ORAL | Status: DC
  Administered 2023-06-15 – 2023-06-16 (×5): 500 mg via ORAL
  Filled 2023-06-15 (×5): qty 1

## 2023-06-15 MED ORDER — SODIUM CHLORIDE 0.9 % IV SOLN
3.0000 g | Freq: Four times a day (QID) | INTRAVENOUS | Status: DC
  Administered 2023-06-15 – 2023-06-19 (×15): 3 g via INTRAVENOUS
  Filled 2023-06-15 (×19): qty 8

## 2023-06-15 MED ORDER — SENNOSIDES-DOCUSATE SODIUM 8.6-50 MG PO TABS
2.0000 | ORAL_TABLET | Freq: Two times a day (BID) | ORAL | Status: DC
Start: 2023-06-15 — End: 2023-06-20
  Administered 2023-06-15 – 2023-06-20 (×10): 2 via ORAL
  Filled 2023-06-15 (×10): qty 2

## 2023-06-15 MED ORDER — ORAL CARE MOUTH RINSE: 15.0000 mL | OROMUCOSAL | Status: DC | PRN

## 2023-06-15 MED ORDER — POLYETHYLENE GLYCOL 3350 17 G PO PACK
17.0000 g | PACK | Freq: Every day | ORAL | Status: DC
Start: 2023-06-15 — End: 2023-06-20
  Administered 2023-06-15 – 2023-06-20 (×5): 17 g via ORAL
  Filled 2023-06-15 (×5): qty 1

## 2023-06-15 NOTE — Progress Notes (Addendum)
[facility] PROGRESS NOTE  Troy [name] (DOB: 07-Jan-1956) WUJ:[id number] PCP: [name], MD  Brief Narrative: Troy [name] is a 68 y.o. 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 He had low grade fever, tachycardia, leukocytosis (WBC 12.9k), lactic acid elevation (2.8 x2). CT abd/pelvis showed progression of disease with increase in the size of liver mass, progression of nodal metastasis. Increase in the size of the metastatic implants in the inferior right rectus sheath and right pelvic sidewall. 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   He was admitted, started on IV antibiotics after blood cultures were drawn and ultimately found to have Klebsiella ornithinolytica bacteremia with identical resistance pattern to prior infection. Port was removed 1/31. ID consulted.    Subjective: continue to have severe waxing/waning left lower abdominal pain radiating into left groin worse with some movements and "sore" when touched. No fevers, feels otherwise well. Also has sporadic right pelvic sidewall pain, both types of pain worsening in recent weeks. He has remote history of ORIF/hardware to right tibia which does not hurt any more than baseline. Spouse at baseline, reporting improved pain control with robaxin. Mentally clear.  Objective: BP 138/81 (BP Location: Right Arm)   Pulse 80   Temp 98.7 F (37.1 C)   Resp 20   Ht 5\' 10"  (1.778 m)   Wt 63.5 kg   SpO2 99%   BMI 20.09 kg/m   Gen: Pleasant male in no distress Pulm: Nonlabored  CV: RRR, no MRG, JVD, or edema GI: Soft, NT, ND, hypoactive bowel sounds, there is tender and ruddy lymphadenopathy  in LLQ and latera abdomen that is tender Neuro: Alert and oriented. No new focal deficits. Ext: Warm, no deformities. Palpable hardware in LLE nontender, no erythema, no erythema/effusions/fluctuance/induration.  Skin: No  rashes, lesions or ulcers on visualized skin. Former port site minimally tender without erythema or exudate or other discharge.   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 Improved after cefepime then augmentin therapy previously but remained febrile despite taking this. - ID consulted, appreciate recommendations.  - GPC in 1 of 4 also noted, rapid ID with Staph spp. (NOS but non-aureus) consistent with contamination. Catheter tip grew Raoultella planticola which is also not felt to be pathogenic in this case.  - We will continue vancomycin and cefepime for now pending ID rec's..  - s/p port removal 1/31    Hyponatremia: Likely related to cirrhosis, possibly also SIADH, though this is improving and mild.  - Will aim to maximize po intake by not restricting diet at this time.   Stage IV hepatocellular carcinoma:  - Holding recently started sorafenib for now per Dr. [name], suggest follow up near conclusion of antibiotic regimen with oncology to discuss reinitiation. - LLQ pain consistent with psoas invasion by adenopathy. Will treat with scheduled robaxin and prn norco.   Cirrhosis with esophageal varices:  - Would ideally be on nonselective beta blocker, though no longer taking coreg.     IDT2DM: Recent HbA1c 8.4%, uncontrolled hyperglycemia.  - Continue levemir (increase back to home dose 15u) and SSI -  Hold metformin and jardiance for now.      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  - Holding anticoagulation due to severely elevated bleeding risk.  -  Pt is on PPI - Monitor H/H intermittently. Stable thus far.  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   - Eliquis was stopped per GI recommendation due to high risk of recurrent GI bleeding.   Chronic thrombocytopenia: Slightly lower than previous baseline, due to sepsis. No bleeding.  - Continue monitoring.   Constipation: Worsened by opioids. Exam reassuring - Augment to senokot scheduled and add miralax daily.   HTN: Normotensive off medications.   Lactic acid elevation: Due to sepsis and reduced hepatic clearance.   Tyrone Nine, MD [facility] [email or web address] 06/15/2023, 10:25 AM
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
## 2023-06-15 NOTE — Plan of Care (Signed)
  Problem: Coping: Goal: Ability to adjust to condition or change in health will improve Outcome: Progressing   Problem: Fluid Volume: Goal: Ability to maintain a balanced intake and output will improve Outcome: Progressing   Problem: Metabolic: Goal: Ability to maintain appropriate glucose levels will improve Outcome: Progressing   Problem: Nutritional: Goal: Maintenance of adequate nutrition will improve Outcome: Progressing   Problem: Skin Integrity: Goal: Risk for impaired skin integrity will decrease Outcome: Progressing   Problem: Respiratory: Goal: Ability to maintain adequate ventilation will improve Outcome: Progressing   Problem: Health Behavior/Discharge Planning: Goal: Ability to manage health-related needs will improve Outcome: Progressing   Problem: Clinical Measurements: Goal: Respiratory complications will improve Outcome: Progressing Goal: Cardiovascular complication will be avoided Outcome: Progressing   Problem: Nutrition: Goal: Adequate nutrition will be maintained Outcome: Progressing   Problem: Coping: Goal: Level of anxiety will decrease Outcome: Progressing   Problem: Elimination: Goal: Will not experience complications related to bowel motility Outcome: Progressing   Problem: Pain Managment: Goal: General experience of comfort will improve and/or be controlled Outcome: Progressing   Problem: Safety: Goal: Ability to remain free from injury will improve Outcome: Progressing

## 2023-06-16 DIAGNOSIS — A498 Other bacterial infections of unspecified site: Secondary | ICD-10-CM

## 2023-06-16 DIAGNOSIS — Z8505 Personal history of malignant neoplasm of liver: Secondary | ICD-10-CM

## 2023-06-16 DIAGNOSIS — R6510 Systemic inflammatory response syndrome (SIRS) of non-infectious origin without acute organ dysfunction: Secondary | ICD-10-CM | POA: Diagnosis not present

## 2023-06-16 DIAGNOSIS — K31819 Angiodysplasia of stomach and duodenum without bleeding: Secondary | ICD-10-CM

## 2023-06-16 DIAGNOSIS — T80212D Local infection due to central venous catheter, subsequent encounter: Secondary | ICD-10-CM

## 2023-06-16 LAB — CBC
HCT: 24.9 % — ABNORMAL LOW (ref 39.0–52.0)
Hemoglobin: 8.1 g/dL — ABNORMAL LOW (ref 13.0–17.0)
MCH: 29.8 pg (ref 26.0–34.0)
MCHC: 32.5 g/dL (ref 30.0–36.0)
MCV: 91.5 fL (ref 80.0–100.0)
Platelets: 98 10*3/uL — ABNORMAL LOW (ref 150–400)
RBC: 2.72 MIL/uL — ABNORMAL LOW (ref 4.22–5.81)
RDW: 14.5 % (ref 11.5–15.5)
WBC: 4.6 10*3/uL (ref 4.0–10.5)
nRBC: 0 % (ref 0.0–0.2)

## 2023-06-16 LAB — GLUCOSE, CAPILLARY
Glucose-Capillary: 138 mg/dL — ABNORMAL HIGH (ref 70–99)
Glucose-Capillary: 119 mg/dL — ABNORMAL HIGH (ref 70–99)
Glucose-Capillary: 113 mg/dL — ABNORMAL HIGH (ref 70–99)
Glucose-Capillary: 179 mg/dL — ABNORMAL HIGH (ref 70–99)

## 2023-06-16 LAB — BASIC METABOLIC PANEL
Anion gap: 10 (ref 5–15)
BUN: 11 mg/dL (ref 8–23)
CO2: 23 mmol/L (ref 22–32)
Calcium: 8.5 mg/dL — ABNORMAL LOW (ref 8.9–10.3)
Chloride: 102 mmol/L (ref 98–111)
Creatinine, Ser: 0.58 mg/dL — ABNORMAL LOW (ref 0.61–1.24)
GFR, Estimated: >60 mL/min (ref >60)
Glucose, Bld: 172 mg/dL — ABNORMAL HIGH (ref 70–99)
Potassium: 3.3 mmol/L — ABNORMAL LOW (ref 3.5–5.1)
Sodium: 135 mmol/L (ref 135–145)

## 2023-06-16 MED ORDER — POTASSIUM CHLORIDE CRYS ER 20 MEQ PO TBCR
40.0000 meq | EXTENDED_RELEASE_TABLET | Freq: Once | ORAL | Status: AC
  Administered 2023-06-16: 40 meq via ORAL
  Filled 2023-06-16: qty 2

## 2023-06-16 NOTE — Plan of Care (Signed)

## 2023-06-16 NOTE — Progress Notes (Signed)
 [facility] PROGRESS NOTE  Troy [name] (DOB: Sep 12, 1955) FMW:[id number] PCP: Troy Callander, MD  Brief Narrative: Troy [name] is a 68 y.o. 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 He had low grade fever, tachycardia, leukocytosis (WBC 12.9k), lactic acid elevation (2.8 x2). CT abd/pelvis showed progression of disease with increase in the size of liver mass, progression of nodal metastasis. Increase in the size of the metastatic implants in the inferior right rectus sheath and right pelvic sidewall. 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   He was admitted, started on IV antibiotics after blood cultures were drawn and ultimately found to have Klebsiella ornithinolytica bacteremia with identical resistance pattern to prior infection. Port was removed 1/31. ID consulted. Plan is to repeat blood cultures 2/4 and transition to oral antibiotics once these have resulted.   Subjective: Says he's not doing too bad. Pain improved with the robaxin .   Objective: BP 120/79   Pulse 87   Temp 98.4 F (36.9 C)   Resp 16   Ht 5' 10 (1.778 m)   Wt 63.5 kg   SpO2 100%   BMI 20.09 kg/m   Gen: No distress Pulm: Clear, nonlabored  CV: RRR, no MRG GI: Soft, stable tenderness without rebound or guarding, again suspect I can palpate some intra-abdominal lymphadenopathy Neuro: Alert and oriented. No new focal deficits. Ext: Warm, no deformities. Skin: No new rashes, lesions or ulcers on visualized skin   Assessment & Plan: Klebsiella ornithinolytica bacteremia: Has history of Klebsiella ornithinolytica sepsis in Dec 2024 and current culture data, this is  suspected to be recurrent/refractory infection due to port (4 out of 4 sets). Improved after cefepime  then augmentin  therapy previously but remained febrile despite taking this PTA. - ID consulted, appreciate recommendations.  - GPC in 1 of 4 also noted, updated ID to stenotrophomonas. Catheter tip grew Raoultella planticola. Both are most likely contaminant/nonpathogenic in this case. To prove clearance, per ID recommendations, blood cultures repeated today. - We will continue unasyn  and follow rec's for po transition. - s/p port removal 1/31    Hyponatremia: Likely related to cirrhosis, possibly also SIADH, though this is improving and mild.  - Will aim to maximize po intake by not restricting diet at this time.   Stage IV hepatocellular carcinoma:  - Holding recently started sorafenib  for now per Dr. Lanny, suggest follow up near conclusion of antibiotic regimen with oncology to discuss reinitiation. Currently has appt 2/24 - LLQ pain consistent with psoas invasion by adenopathy. Will treat with scheduled robaxin  and prn norco.   Cirrhosis with esophageal varices:  - Would ideally be on nonselective beta blocker, though no longer taking coreg .     IDT2DM: Recent HbA1c 8.4%, uncontrolled hyperglycemia.  - Continue levemir (increase back to home dose 15u) and SSI - Hold metformin and jardiance  for now.      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  - Holding anticoagulation due to severely elevated bleeding risk.  - Pt is on PPI - Monitor H/H intermittently. Stable thus far.  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   -  Eliquis  was stopped per GI recommendation due to high risk of recurrent GI bleeding.   Chronic thrombocytopenia: Slightly lower than previous baseline, due to sepsis. No bleeding.  - Continue monitoring.   Constipation: Worsened  by opioids. Exam reassuring - Augment to senokot scheduled and add miralax  daily.   HTN: Normotensive off medications.   Hypokalemia:  - Supplement  Lactic acid elevation: Due to sepsis and reduced hepatic clearance.   Troy KATHEE Come, MD [facility] [email or web address] 06/16/2023, 4:02 PM

## 2023-06-16 NOTE — Progress Notes (Signed)
 Subjective: No new complaints   Antibiotics:  Anti-infectives (From admission, onward)    Start     Dose/Rate Route Frequency Ordered Stop   06/15/23 1000  Ampicillin -Sulbactam (UNASYN ) 3 g in sodium chloride  0.9 % 100 mL IVPB        3 g 200 mL/hr over 30 Minutes Intravenous Every 6 hours 06/15/23 0851     06/14/23 2200  vancomycin  (VANCOREADY) IVPB 1500 mg/300 mL  Status:  Discontinued        1,500 mg 150 mL/hr over 120 Minutes Intravenous Every 24 hours 06/14/23 1405 06/15/23 0851   06/13/23 1700  ceFEPIme  (MAXIPIME ) 2 g in sodium chloride  0.9 % 100 mL IVPB  Status:  Discontinued        2 g 200 mL/hr over 30 Minutes Intravenous Every 8 hours 06/13/23 1425 06/15/23 0851   06/12/23 2200  vancomycin  (VANCOREADY) IVPB 1250 mg/250 mL  Status:  Discontinued        1,250 mg 166.7 mL/hr over 90 Minutes Intravenous Every 24 hours 06/12/23 1205 06/14/23 1405   06/12/23 1800  vancomycin  (VANCOCIN ) IVPB 1000 mg/200 mL premix  Status:  Discontinued        1,000 mg 200 mL/hr over 60 Minutes Intravenous Every 24 hours 06/11/23 2232 06/12/23 0946   06/12/23 0900  ceFEPIme  (MAXIPIME ) 2 g in sodium chloride  0.9 % 100 mL IVPB  Status:  Discontinued        2 g 200 mL/hr over 30 Minutes Intravenous Every 12 hours 06/11/23 2232 06/13/23 1425   06/11/23 2200  metroNIDAZOLE  (FLAGYL ) IVPB 500 mg  Status:  Discontinued        500 mg 100 mL/hr over 60 Minutes Intravenous Every 12 hours 06/11/23 2145 06/12/23 0946   06/11/23 2130  vancomycin  (VANCOREADY) IVPB 1250 mg/250 mL        1,250 mg 166.7 mL/hr over 90 Minutes Intravenous  Once 06/11/23 2046 06/11/23 2316   06/11/23 2100  ceFEPIme  (MAXIPIME ) 2 g in sodium chloride  0.9 % 100 mL IVPB        2 g 200 mL/hr over 30 Minutes Intravenous  Once 06/11/23 2046 06/11/23 2135       Medications: Scheduled Meds:  dicyclomine   10 mg Oral TID AC & HS   insulin  aspart  0-5 Units Subcutaneous QHS   insulin  aspart  0-9 Units Subcutaneous TID WC    insulin  glargine-yfgn  15 Units Subcutaneous Daily   methocarbamol   500 mg Oral TID   pantoprazole   40 mg Oral BID AC   polyethylene glycol  17 g Oral Daily   senna-docusate  2 tablet Oral BID   Continuous Infusions:  ampicillin -sulbactam (UNASYN ) IV 3 g (06/16/23 1056)   PRN Meds:.acetaminophen  **OR** acetaminophen , feeding supplement, HYDROcodone -acetaminophen , ondansetron  **OR** ondansetron  (ZOFRAN ) IV, mouth rinse, polyvinyl alcohol     Objective: Weight change:   Intake/Output Summary (Last 24 hours) at 06/16/2023 1612 Last data filed at 06/16/2023 0837 Gross per 24 hour  Intake 680 ml  Output 350 ml  Net 330 ml   Blood pressure 120/79, pulse 87, temperature 98.4 F (36.9 C), resp. rate 16, height 5' 10 (1.778 m), weight 63.5 kg, SpO2 100%. Temp:  [97.8 F (36.6 C)-98.4 F (36.9 C)] 98.4 F (36.9 C) (02/04 1224) Pulse Rate:  [85-87] 87 (02/04 1224) Resp:  [14-20] 16 (02/04 1224) BP: (111-120)/(78-90) 120/79 (02/04 1224) SpO2:  [100 %] 100 % (02/04 1224)  Physical Exam: Physical Exam Constitutional:  Appearance: He is well-developed.  HENT:     Head: Normocephalic and atraumatic.  Eyes:     Conjunctiva/sclera: Conjunctivae normal.  Cardiovascular:     Rate and Rhythm: Normal rate and regular rhythm.  Pulmonary:     Effort: Pulmonary effort is normal. No respiratory distress.     Breath sounds: No wheezing.  Abdominal:     General: There is no distension.  Musculoskeletal:        General: Normal range of motion.     Cervical back: Normal range of motion and neck supple.  Skin:    General: Skin is warm and dry.     Findings: No erythema or rash.  Neurological:     General: No focal deficit present.     Mental Status: He is alert and oriented to person, place, and time.  Psychiatric:        Mood and Affect: Mood normal.        Behavior: Behavior normal.        Thought Content: Thought content normal.        Judgment: Judgment normal.       CBC:    BMET Recent Labs    06/15/23 0422 06/16/23 0438  NA 134* 135  K 3.4* 3.3*  CL 104 102  CO2 23 23  GLUCOSE 178* 172*  BUN 12 11  CREATININE 0.90 0.58*  CALCIUM 8.6* 8.5*     Liver Panel  No results for input(s): PROT, ALBUMIN , AST, ALT, ALKPHOS, BILITOT, BILIDIR, IBILI in the last 72 hours.     Sedimentation Rate No results for input(s): ESRSEDRATE in the last 72 hours. C-Reactive Protein No results for input(s): CRP in the last 72 hours.  Micro Results: Recent Results (from the past 720 hours)  Resp panel by RT-PCR (RSV, Flu A&B, Covid) Anterior Nasal Swab     Status: None   Collection Time: 06/11/23  4:52 PM   Specimen: Anterior Nasal Swab  Result Value Ref Range Status   SARS Coronavirus 2 by RT PCR NEGATIVE NEGATIVE Final    Comment: (NOTE) SARS-CoV-2 target nucleic acids are NOT DETECTED.  The SARS-CoV-2 RNA is generally detectable in upper respiratory specimens during the acute phase of infection. The lowest concentration of SARS-CoV-2 viral copies this assay can detect is 138 copies/mL. A negative result does not preclude SARS-Cov-2 infection and should not be used as the sole basis for treatment or other patient management decisions. A negative result may occur with  improper specimen collection/handling, submission of specimen other than nasopharyngeal swab, presence of viral mutation(s) within the areas targeted by this assay, and inadequate number of viral copies(<138 copies/mL). A negative result must be combined with clinical observations, patient history, and epidemiological information. The expected result is Negative.  Fact Sheet for Patients:  bloggercourse.com  Fact Sheet for Healthcare Providers:  seriousbroker.it  This test is no t yet approved or cleared by the [place]  FDA and  has been authorized for detection and/or diagnosis of SARS-CoV-2 by FDA  under an Emergency Use Authorization (EUA). This EUA will remain  in effect (meaning this test can be used) for the duration of the COVID-19 declaration under Section 564(b)(1) of the Act, 21 U.S.C.section 360bbb-3(b)(1), unless the authorization is terminated  or revoked sooner.       Influenza A by PCR NEGATIVE NEGATIVE Final   Influenza B by PCR NEGATIVE NEGATIVE Final    Comment: (NOTE) The Xpert Xpress SARS-CoV-2/FLU/RSV plus assay is intended as an aid  in the diagnosis of influenza from Nasopharyngeal swab specimens and should not be used as a sole basis for treatment. Nasal washings and aspirates are unacceptable for Xpert Xpress SARS-CoV-2/FLU/RSV testing.  Fact Sheet for Patients: bloggercourse.com  Fact Sheet for Healthcare Providers: seriousbroker.it  This test is not yet approved or cleared by the [place]  FDA and has been authorized for detection and/or diagnosis of SARS-CoV-2 by FDA under an Emergency Use Authorization (EUA). This EUA will remain in effect (meaning this test can be used) for the duration of the COVID-19 declaration under Section 564(b)(1) of the Act, 21 U.S.C. section 360bbb-3(b)(1), unless the authorization is terminated or revoked.     Resp Syncytial Virus by PCR NEGATIVE NEGATIVE Final    Comment: (NOTE) Fact Sheet for Patients: bloggercourse.com  Fact Sheet for Healthcare Providers: seriousbroker.it  This test is not yet approved or cleared by the [place]  FDA and has been authorized for detection and/or diagnosis of SARS-CoV-2 by FDA under an Emergency Use Authorization (EUA). This EUA will remain in effect (meaning this test can be used) for the duration of the COVID-19 declaration under Section 564(b)(1) of the Act, 21 U.S.C. section 360bbb-3(b)(1), unless the authorization is terminated or revoked.  Performed at [facility], [address] [place], [place] [address]   Blood Culture (routine x 2)     Status: Abnormal   Collection Time: 06/11/23  5:08 PM   Specimen: BLOOD  Result Value Ref Range Status   Specimen Description   Final    BLOOD RIGHT ANTECUBITAL Performed at [facility], [address] [place], [place] [address]    Special Requests   Final    BOTTLES DRAWN AEROBIC AND ANAEROBIC Blood Culture adequate volume Performed at [facility], [address] [place], [place] [address]    Culture  Setup Time   Final    GRAM NEGATIVE RODS IN BOTH AEROBIC AND ANAEROBIC BOTTLES CRITICAL RESULT CALLED TO, READ BACK BY AND VERIFIED WITH: PHARMD A.ARLINGTON AT 9073 ON 06/12/2023 BY T.SAAD. Performed at [facility], [address] [place], [place] [address]    Culture KLEBSIELLA ORNITHINOLYTICA (A)  Final   Report Status 06/14/2023 FINAL  Final   Organism ID, Bacteria KLEBSIELLA ORNITHINOLYTICA  Final      Susceptibility   Klebsiella ornithinolytica - MIC*    AMPICILLIN  RESISTANT Resistant     CEFEPIME  <=0.12 SENSITIVE Sensitive     CEFTAZIDIME <=1 SENSITIVE Sensitive     CEFTRIAXONE  <=0.25 SENSITIVE Sensitive     CIPROFLOXACIN <=0.25 SENSITIVE Sensitive     GENTAMICIN <=1 SENSITIVE Sensitive     IMIPENEM <=0.25 SENSITIVE Sensitive     TRIMETH/SULFA <=20 SENSITIVE Sensitive     AMPICILLIN /SULBACTAM <=2 SENSITIVE Sensitive     PIP/TAZO <=4 SENSITIVE Sensitive ug/mL    * KLEBSIELLA ORNITHINOLYTICA  Blood Culture (routine x 2)     Status: Abnormal   Collection Time: 06/11/23  5:08 PM   Specimen: BLOOD  Result Value Ref Range Status   Specimen Description   Final    BLOOD LEFT ANTECUBITAL Performed at [facility], [address] [place], [place] [address]    Special Requests   Final    BOTTLES DRAWN AEROBIC AND ANAEROBIC Blood Culture adequate volume Performed at [facility], [address], [place], [place] [address]    Culture  Setup Time   Final    GRAM NEGATIVE RODS IN BOTH AEROBIC AND  ANAEROBIC BOTTLES CRITICAL VALUE NOTED.  VALUE IS CONSISTENT WITH PREVIOUSLY REPORTED AND CALLED VALUE. GRAM POSITIVE COCCI AEROBIC BOTTLE ONLY CRITICAL RESULT CALLED TO, READ BACK BY AND VERIFIED WITH: PHARMD A ALLINGTON [id number] AT 1125 BY CM    Culture (A)  Final    KLEBSIELLA ORNITHINOLYTICA SUSCEPTIBILITIES PERFORMED ON PREVIOUS CULTURE WITHIN THE LAST 5 DAYS. STAPHYLOCOCCUS HAEMOLYTICUS THE SIGNIFICANCE OF ISOLATING THIS ORGANISM FROM A SINGLE SET OF BLOOD CULTURES WHEN MULTIPLE SETS ARE DRAWN IS UNCERTAIN. PLEASE NOTIFY THE MICROBIOLOGY DEPARTMENT WITHIN ONE WEEK IF SPECIATION AND SENSITIVITIES ARE REQUIRED. Performed at [facility], [address] [place], [place] [address]    Report Status 06/15/2023 FINAL  Final  Blood Culture ID Panel (Reflexed)     Status: Abnormal   Collection Time: 06/11/23  5:08 PM  Result Value Ref Range Status   Enterococcus faecalis NOT DETECTED NOT DETECTED Final   Enterococcus Faecium NOT DETECTED NOT DETECTED Final   Listeria monocytogenes NOT DETECTED NOT DETECTED Final   Staphylococcus species NOT DETECTED NOT DETECTED Final   Staphylococcus aureus (BCID) NOT DETECTED NOT DETECTED Final   Staphylococcus epidermidis NOT DETECTED NOT DETECTED Final   Staphylococcus lugdunensis NOT DETECTED NOT DETECTED Final   Streptococcus species NOT DETECTED NOT DETECTED Final   Streptococcus agalactiae NOT DETECTED NOT DETECTED Final   Streptococcus pneumoniae NOT DETECTED NOT DETECTED Final   Streptococcus pyogenes NOT DETECTED NOT DETECTED Final   A.calcoaceticus-baumannii NOT DETECTED NOT DETECTED Final   Bacteroides fragilis NOT DETECTED NOT DETECTED Final   Enterobacterales DETECTED (A) NOT DETECTED Final    Comment: Enterobacterales represent a large order of gram negative bacteria, not a single organism. Refer to culture for further  identification. CRITICAL RESULT CALLED TO, READ BACK BY AND VERIFIED WITH: PHARMD A.ARLINGTON AT 9073 ON 06/12/2023 BY T.SAAD.    Enterobacter cloacae complex NOT DETECTED NOT DETECTED Final   Escherichia coli NOT DETECTED NOT DETECTED Final   Klebsiella aerogenes NOT DETECTED NOT DETECTED Final   Klebsiella oxytoca NOT DETECTED NOT DETECTED Final   Klebsiella pneumoniae NOT DETECTED NOT DETECTED Final   Proteus species NOT DETECTED NOT DETECTED Final   Salmonella species NOT DETECTED NOT DETECTED Final   Serratia marcescens NOT DETECTED NOT DETECTED Final   Haemophilus influenzae NOT DETECTED NOT DETECTED Final   Neisseria meningitidis NOT DETECTED NOT DETECTED Final   Pseudomonas aeruginosa NOT DETECTED NOT DETECTED Final   Stenotrophomonas maltophilia NOT DETECTED NOT DETECTED Final   Candida albicans NOT DETECTED NOT DETECTED Final   Candida auris NOT DETECTED NOT DETECTED Final   Candida glabrata NOT DETECTED NOT DETECTED Final   Candida krusei NOT DETECTED NOT DETECTED Final   Candida parapsilosis NOT DETECTED NOT DETECTED Final   Candida tropicalis NOT DETECTED NOT DETECTED Final   Cryptococcus neoformans/gattii NOT DETECTED NOT DETECTED Final   CTX-M ESBL NOT DETECTED NOT DETECTED Final   Carbapenem resistance IMP NOT DETECTED NOT DETECTED Final   Carbapenem resistance KPC NOT DETECTED NOT DETECTED Final   Carbapenem resistance NDM NOT DETECTED NOT DETECTED Final   Carbapenem resist OXA 48 LIKE NOT DETECTED NOT DETECTED Final   Carbapenem resistance VIM NOT DETECTED NOT DETECTED Final    Comment: Performed at [facility], [address], [place], [place] [address]  Blood Culture ID Panel (Reflexed)     Status: Abnormal   Collection Time: 06/11/23  5:08 PM  Result Value Ref Range Status   Enterococcus faecalis NOT DETECTED NOT DETECTED Final  Enterococcus Faecium NOT DETECTED NOT DETECTED Final   Listeria monocytogenes NOT DETECTED NOT DETECTED Final    Staphylococcus species DETECTED (A) NOT DETECTED Final    Comment: CRITICAL RESULT CALLED TO, READ BACK BY AND VERIFIED WITH: PHARMD A ALLINGTON [id number] AT 1125 BY CM    Staphylococcus aureus (BCID) NOT DETECTED NOT DETECTED Final   Staphylococcus epidermidis NOT DETECTED NOT DETECTED Final   Staphylococcus lugdunensis NOT DETECTED NOT DETECTED Final   Streptococcus species NOT DETECTED NOT DETECTED Final   Streptococcus agalactiae NOT DETECTED NOT DETECTED Final   Streptococcus pneumoniae NOT DETECTED NOT DETECTED Final   Streptococcus pyogenes NOT DETECTED NOT DETECTED Final   A.calcoaceticus-baumannii NOT DETECTED NOT DETECTED Final   Bacteroides fragilis NOT DETECTED NOT DETECTED Final   Enterobacterales DETECTED (A) NOT DETECTED Final    Comment: Enterobacterales represent a large order of gram negative bacteria, not a single organism. Refer to culture for further identification. CRITICAL RESULT CALLED TO, READ BACK BY AND VERIFIED WITH: PHARMD A ALLINGTON [id number] AT 1125 BY CM    Enterobacter cloacae complex NOT DETECTED NOT DETECTED Final   Escherichia coli NOT DETECTED NOT DETECTED Final   Klebsiella aerogenes NOT DETECTED NOT DETECTED Final   Klebsiella oxytoca NOT DETECTED NOT DETECTED Final   Klebsiella pneumoniae NOT DETECTED NOT DETECTED Final   Proteus species NOT DETECTED NOT DETECTED Final   Salmonella species NOT DETECTED NOT DETECTED Final   Serratia marcescens NOT DETECTED NOT DETECTED Final   Haemophilus influenzae NOT DETECTED NOT DETECTED Final   Neisseria meningitidis NOT DETECTED NOT DETECTED Final   Pseudomonas aeruginosa NOT DETECTED NOT DETECTED Final   Stenotrophomonas maltophilia NOT DETECTED NOT DETECTED Final   Candida albicans NOT DETECTED NOT DETECTED Final   Candida auris NOT DETECTED NOT DETECTED Final   Candida glabrata NOT DETECTED NOT DETECTED Final   Candida krusei NOT DETECTED NOT DETECTED Final   Candida parapsilosis NOT DETECTED NOT DETECTED  Final   Candida tropicalis NOT DETECTED NOT DETECTED Final   Cryptococcus neoformans/gattii NOT DETECTED NOT DETECTED Final   CTX-M ESBL NOT DETECTED NOT DETECTED Final   Carbapenem resistance IMP NOT DETECTED NOT DETECTED Final   Carbapenem resistance KPC NOT DETECTED NOT DETECTED Final   Carbapenem resistance NDM NOT DETECTED NOT DETECTED Final   Carbapenem resist OXA 48 LIKE NOT DETECTED NOT DETECTED Final   Carbapenem resistance VIM NOT DETECTED NOT DETECTED Final    Comment: Performed at [facility], [address] [place], [place] [address]  Blood culture (routine x 2)     Status: Abnormal (Preliminary result)   Collection Time: 06/11/23  8:45 PM   Specimen: BLOOD RIGHT HAND  Result Value Ref Range Status   Specimen Description   Final    BLOOD RIGHT HAND Performed at [facility], [address] [place], [place] [address]    Special Requests   Final    BOTTLES DRAWN AEROBIC AND ANAEROBIC Blood Culture results may not be optimal due to an inadequate volume of blood received in culture bottles Performed at [facility], [address], [place], [place] [address]    Culture  Setup Time   Final    GRAM NEGATIVE RODS ANAEROBIC BOTTLE ONLY CRITICAL VALUE NOTED.  VALUE IS CONSISTENT WITH PREVIOUSLY REPORTED AND CALLED VALUE.    Culture (A)  Final    KLEBSIELLA ORNITHINOLYTICA SUSCEPTIBILITIES PERFORMED ON PREVIOUS CULTURE WITHIN THE LAST 5 DAYS. STENOTROPHOMONAS MALTOPHILIA SUSCEPTIBILITIES TO FOLLOW CRITICAL RESULT CALLED  TO, READ BACK BY AND VERIFIED WITH: MAYA CHRISTELLA MILLET [id number] AT 920 BY CM Performed at [facility], [address] [place], [place] [address]    Report Status PENDING  Incomplete  Blood culture (routine x 2)     Status: Abnormal   Collection Time: 06/11/23  9:00 PM   Specimen: BLOOD  Result Value Ref Range Status   Specimen Description   Final    BLOOD LEFT ANTECUBITAL Performed at [facility],  [address] [place], [place] [address]    Special Requests   Final    BOTTLES DRAWN AEROBIC AND ANAEROBIC Blood Culture results may not be optimal due to an inadequate volume of blood received in culture bottles Performed at [facility], [address] [place], [place] [address]    Culture  Setup Time   Final    GRAM NEGATIVE RODS AEROBIC BOTTLE ONLY CRITICAL VALUE NOTED.  VALUE IS CONSISTENT WITH PREVIOUSLY REPORTED AND CALLED VALUE.    Culture (A)  Final    KLEBSIELLA ORNITHINOLYTICA SUSCEPTIBILITIES PERFORMED ON PREVIOUS CULTURE WITHIN THE LAST 5 DAYS. Performed at [facility], [address], [place], [place] [address]    Report Status 06/14/2023 FINAL  Final  Cath Tip Culture     Status: Abnormal   Collection Time: 06/12/23  5:14 PM   Specimen: Catheter Tip  Result Value Ref Range Status   Specimen Description   Final    CATH TIP Performed at [facility], [address] [place], [place] [address]    Special Requests   Final    NONE Performed at [facility], [address], [place], [place] [address]    Culture RAOULTELLA PLANTICOLA (A)  Final   Report Status 06/14/2023 FINAL  Final   Organism ID, Bacteria RAOULTELLA PLANTICOLA  Final      Susceptibility   Raoultella planticola - MIC*    AMPICILLIN  >=32 RESISTANT Resistant     CEFEPIME  <=0.12 SENSITIVE Sensitive     CEFTAZIDIME <=1 SENSITIVE Sensitive     CEFTRIAXONE  <=0.25 SENSITIVE Sensitive     CIPROFLOXACIN <=0.25 SENSITIVE Sensitive     GENTAMICIN <=1 SENSITIVE Sensitive     IMIPENEM 0.5 SENSITIVE Sensitive     TRIMETH/SULFA <=20 SENSITIVE Sensitive     AMPICILLIN /SULBACTAM 4 SENSITIVE Sensitive     PIP/TAZO 8 SENSITIVE Sensitive ug/mL    * RAOULTELLA PLANTICOLA  Culture, blood (Routine X 2) w Reflex to ID Panel     Status: None (Preliminary result)   Collection Time: 06/16/23  2:16 PM   Specimen: BLOOD RIGHT ARM  Result Value Ref Range  Status   Specimen Description   Final    BLOOD RIGHT ARM Performed at [facility], [address], [place], [place] [address]    Special Requests   Final    BOTTLES DRAWN AEROBIC AND ANAEROBIC Blood Culture results may not be optimal due to an inadequate volume of blood received in culture bottles Performed at [facility], [address] [place], [place] [address]    Culture PENDING  Incomplete   Report Status PENDING  Incomplete  Culture, blood (Routine X 2) w Reflex to ID Panel     Status: None (Preliminary result)   Collection Time: 06/16/23  2:21 PM   Specimen: BLOOD RIGHT ARM  Result Value Ref Range Status   Specimen Description   Final    BLOOD RIGHT ARM Performed at  [facility], [address] [place], [place] [address]    Special Requests   Final    BOTTLES DRAWN AEROBIC AND ANAEROBIC Blood Culture results may not be optimal due to an inadequate volume of blood received in culture bottles Performed at [facility], [address], [place], [place] [address]    Culture PENDING  Incomplete   Report Status PENDING  Incomplete    Studies/Results: No results found.    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 male with current Klebsiella  due to port that now has been removed   #1 Port infection due to Klebsiella  Our plan was to  DC him with augmentin  to complete 2 weeks of post port removal antibiotics   #2 S. Maltophila in 1/2 blood cultures. While not a common skin organism like the Coag neg staph it can be on the skin and is a fairly low virulence organism  I think there is a good chance of it also being a contaminant as is the case for Staph H  To try to shed clarity I ordered repeat blood cultures since if the S maltophila is truly a pathogen it should grow since we are NOT targetting it at present  Typically levaquin, bactrim, minocycline can be active and we could always add one of these bio-available drugs down the road if cultures pop positive in next 5 days  For now I would prefer to not target it and followup the repeat blood cultures  I have personally spent 54  minutes involved in face-to-face and non-face-to-face activities for this patient on the day of the visit. Professional time spent includes the following activities: Preparing to see the patient (review of tests), Obtaining and/or reviewing separately obtained history (admission/discharge record), Performing a medically appropriate examination and/or evaluation , Ordering medications/tests/procedures, referring and communicating with other health care professionals, Documenting clinical information in the EMR, Independently interpreting results (not separately reported), Communicating results to the patient/family/caregiver, Counseling and educating the patient/family/caregiver and Care coordination (not separately reported).   Evaluation of the patient requires complex antimicrobial therapy evaluation, counseling , isolation needs to reduce disease transmission and risk assessment and mitigation.     LOS: 5 days   Jomarie Fleeta Rothman 06/16/2023, 4:12 PM

## 2023-06-16 NOTE — Progress Notes (Signed)
 PHARMACY - PHYSICIAN COMMUNICATION CRITICAL VALUE ALERT - BLOOD CULTURE IDENTIFICATION (BCID)  Troy [name] is an 68 y.o. male who presented to [facility] on 06/11/2023 with a chief complaint of bacteremia related to an implanted port.   Assessment:  68 year old male with klebsiella ornithinolytica in 4/4 blood cultures. Now with update on blood cultures showing stenotrophomonas in 1 out of 4 sets. Discussed with Dr. Fleeta [name]- Significance of this is unclear - will assess on afternoon ID rounds.   Name of physician (or Provider) Contacted: Troy [name]   Current antibiotics: Unasyn    Changes to prescribed antibiotics recommended:  ID will assess this afternoon if antibiotic changes are needed  Could consider Bactrim + Minocycline to cover all organisms   Damien Quiet, PharmD, BCPS, BCIDP Infectious Diseases Clinical Pharmacist Phone: [phone] 06/16/2023  9:31 AM

## 2023-06-17 ENCOUNTER — Ambulatory Visit
Admit: 2023-06-17 | Discharge: 2023-06-17 | Disposition: A | Discharge status: Home / Self Care | Payer: No Typology Code available for payment source | Attending: Radiation Oncology | Admitting: Radiation Oncology

## 2023-06-17 ENCOUNTER — Institutional Professional Consult (permissible substitution): Payer: No Typology Code available for payment source | Admitting: Radiation Oncology

## 2023-06-17 ENCOUNTER — Other Ambulatory Visit (HOSPITAL_COMMUNITY): Payer: Non-veteran care

## 2023-06-17 DIAGNOSIS — Z8505 Personal history of malignant neoplasm of liver: Secondary | ICD-10-CM

## 2023-06-17 DIAGNOSIS — R509 Fever, unspecified: Secondary | ICD-10-CM

## 2023-06-17 DIAGNOSIS — R7881 Bacteremia: Secondary | ICD-10-CM | POA: Diagnosis not present

## 2023-06-17 DIAGNOSIS — B961 Klebsiella pneumoniae [K. pneumoniae] as the cause of diseases classified elsewhere: Secondary | ICD-10-CM | POA: Diagnosis not present

## 2023-06-17 DIAGNOSIS — C220 Liver cell carcinoma: Secondary | ICD-10-CM | POA: Diagnosis not present

## 2023-06-17 DIAGNOSIS — R6510 Systemic inflammatory response syndrome (SIRS) of non-infectious origin without acute organ dysfunction: Secondary | ICD-10-CM | POA: Diagnosis not present

## 2023-06-17 DIAGNOSIS — K31819 Angiodysplasia of stomach and duodenum without bleeding: Secondary | ICD-10-CM | POA: Diagnosis not present

## 2023-06-17 LAB — BASIC METABOLIC PANEL
Anion gap: 8 (ref 5–15)
BUN: 9 mg/dL (ref 8–23)
CO2: 23 mmol/L (ref 22–32)
Calcium: 8.2 mg/dL — ABNORMAL LOW (ref 8.9–10.3)
Chloride: 105 mmol/L (ref 98–111)
Creatinine, Ser: 0.72 mg/dL (ref 0.61–1.24)
GFR, Estimated: >60 mL/min (ref >60)
Glucose, Bld: 194 mg/dL — ABNORMAL HIGH (ref 70–99)
Potassium: 3.6 mmol/L (ref 3.5–5.1)
Sodium: 136 mmol/L (ref 135–145)

## 2023-06-17 LAB — CULTURE, BLOOD (ROUTINE X 2)

## 2023-06-17 LAB — CBC
HCT: 25.9 % — ABNORMAL LOW (ref 39.0–52.0)
Hemoglobin: 8.4 g/dL — ABNORMAL LOW (ref 13.0–17.0)
MCH: 30.2 pg (ref 26.0–34.0)
MCHC: 32.4 g/dL (ref 30.0–36.0)
MCV: 93.2 fL (ref 80.0–100.0)
Platelets: 107 10*3/uL — ABNORMAL LOW (ref 150–400)
RBC: 2.78 MIL/uL — ABNORMAL LOW (ref 4.22–5.81)
RDW: 14.7 % (ref 11.5–15.5)
WBC: 4.7 10*3/uL (ref 4.0–10.5)
nRBC: 0 % (ref 0.0–0.2)

## 2023-06-17 LAB — GLUCOSE, CAPILLARY
Glucose-Capillary: 181 mg/dL — ABNORMAL HIGH (ref 70–99)
Glucose-Capillary: 176 mg/dL — ABNORMAL HIGH (ref 70–99)
Glucose-Capillary: 144 mg/dL — ABNORMAL HIGH (ref 70–99)
Glucose-Capillary: 169 mg/dL — ABNORMAL HIGH (ref 70–99)

## 2023-06-17 MED ORDER — METHOCARBAMOL 500 MG PO TABS: 1000.0000 mg | ORAL_TABLET | Freq: Three times a day (TID) | ORAL | Status: DC

## 2023-06-17 MED ORDER — METHOCARBAMOL 500 MG PO TABS
1000.0000 mg | ORAL_TABLET | Freq: Three times a day (TID) | ORAL | Status: DC
  Administered 2023-06-17 – 2023-06-20 (×10): 1000 mg via ORAL
  Filled 2023-06-17 (×9): qty 2

## 2023-06-17 NOTE — Plan of Care (Signed)

## 2023-06-17 NOTE — Progress Notes (Signed)
 Troy [name]   DOB:1955/07/05   FM#:[id number]   RDW#:[id number]  Oncology follow up   Subjective: Patient is afebrile, complains of left-sided low back pain.  Wife at the bedside.  Objective:  Vitals:   06/16/23 1956 06/17/23 0704  BP: 134/73 (!) 143/90  Pulse: 77 92  Resp: 20 16  Temp: (!) 97.5 F (36.4 C) 99.6 F (37.6 C)  SpO2: 99% 98%    Body mass index is 20.09 kg/m.  Intake/Output Summary (Last 24 hours) at 06/17/2023 1202 Last data filed at 06/16/2023 1230 Gross per 24 hour  Intake 240 ml  Output --  Net 240 ml     Sclerae unicteric  Oropharynx clear  No peripheral adenopathy  Lungs clear -- no rales or rhonchi  Heart regular rate and rhythm  Abdomen soft, with mild tenderness in the upper abdomen  MSK no focal spinal tenderness, no peripheral edema  Neuro nonfocal    CBG (last 3)  Recent Labs    06/16/23 2101 06/17/23 0822 06/17/23 1145  GLUCAP 179* 181* 176*     Labs:  Lab Results  Component Value Date   WBC 4.7 06/17/2023   HGB 8.4 (L) 06/17/2023   HCT 25.9 (L) 06/17/2023   MCV 93.2 06/17/2023   PLT 107 (L) 06/17/2023   NEUTROABS 10.8 (H) 06/11/2023    Urine Studies No results for input(s): UHGB, CRYS in the last 72 hours.  Invalid input(s): UACOL, UAPR, USPG, UPH, UTP, UGL, UKET, UBIL, UNIT, UROB, [place], UEPI, UWBC, CORINN JERROL BURNS [place], [place]  Basic Metabolic Panel: Recent Labs  Lab 06/13/23 0454 06/14/23 0439 06/15/23 0422 06/16/23 0438 06/17/23 0437  NA 130* 131* 134* 135 136  K 4.0 3.6 3.4* 3.3* 3.6  CL 100 102 104 102 105  CO2 24 23 23 23 23   GLUCOSE 195* 169* 178* 172* 194*  BUN 20 14 12 11 9   CREATININE 1.01 0.76 0.90 0.58* 0.72  CALCIUM 8.4* 8.6* 8.6* 8.5* 8.2*   GFR Estimated Creatinine Clearance: 78.3 mL/min (by C-G formula based on SCr of 0.72 mg/dL). Liver Function Tests: Recent Labs  Lab 06/11/23 1617 06/12/23 0552  AST 109* 76*  ALT 43 31  ALKPHOS 257* 204*  BILITOT  2.2* 1.7*  PROT 7.8 6.6  ALBUMIN  2.3* 1.9*   No results for input(s): LIPASE, AMYLASE in the last 168 hours. No results for input(s): AMMONIA in the last 168 hours. Coagulation profile Recent Labs  Lab 06/11/23 1617  INR 1.3*    CBC: Recent Labs  Lab 06/11/23 1617 06/12/23 0552 06/13/23 0454 06/14/23 0439 06/15/23 0422 06/16/23 0438 06/17/23 0437  WBC 12.9*   < > 7.9 7.9 5.9 4.6 4.7  NEUTROABS 10.8*  --   --   --   --   --   --   HGB 10.2*   < > 8.4* 8.7* 8.6* 8.1* 8.4*  HCT 30.1*   < > 25.3* 26.2* 26.0* 24.9* 25.9*  MCV 91.5   < > 92.0 91.6 91.9 91.5 93.2  PLT 78*   < > 69* 92* 96* 98* 107*   < > = values in this interval not displayed.   Cardiac Enzymes: No results for input(s): CKTOTAL, CKMB, CKMBINDEX, TROPONINI in the last 168 hours. BNP: Invalid input(s): POCBNP CBG: Recent Labs  Lab 06/16/23 1225 06/16/23 1611 06/16/23 2101 06/17/23 0822 06/17/23 1145  GLUCAP 119* 113* 179* 181* 176*   D-Dimer No results for input(s): DDIMER in the last 72 hours. Hgb A1c No results for  input(s): HGBA1C in the last 72 hours. Lipid Profile No results for input(s): CHOL, HDL, LDLCALC, TRIG, CHOLHDL, LDLDIRECT in the last 72 hours. Thyroid  function studies No results for input(s): TSH, T4TOTAL, T3FREE, THYROIDAB in the last 72 hours.  Invalid input(s): FREET3  Anemia work up No results for input(s): VITAMINB12, FOLATE, FERRITIN, TIBC, IRON , RETICCTPCT in the last 72 hours. Microbiology Recent Results (from the past 240 hours)  Resp panel by RT-PCR (RSV, Flu A&B, Covid) Anterior Nasal Swab     Status: None   Collection Time: 06/11/23  4:52 PM   Specimen: Anterior Nasal Swab  Result Value Ref Range Status   SARS Coronavirus 2 by RT PCR NEGATIVE NEGATIVE Final    Comment: (NOTE) SARS-CoV-2 target nucleic acids are NOT DETECTED.  The SARS-CoV-2 RNA is generally detectable in upper respiratory specimens during the  acute phase of infection. The lowest concentration of SARS-CoV-2 viral copies this assay can detect is 138 copies/mL. A negative result does not preclude SARS-Cov-2 infection and should not be used as the sole basis for treatment or other patient management decisions. A negative result may occur with  improper specimen collection/handling, submission of specimen other than nasopharyngeal swab, presence of viral mutation(s) within the areas targeted by this assay, and inadequate number of viral copies(<138 copies/mL). A negative result must be combined with clinical observations, patient history, and epidemiological information. The expected result is Negative.  Fact Sheet for Patients:  bloggercourse.com  Fact Sheet for Healthcare Providers:  seriousbroker.it  This test is no t yet approved or cleared by the [place]  FDA and  has been authorized for detection and/or diagnosis of SARS-CoV-2 by FDA under an Emergency Use Authorization (EUA). This EUA will remain  in effect (meaning this test can be used) for the duration of the COVID-19 declaration under Section 564(b)(1) of the Act, 21 U.S.C.section 360bbb-3(b)(1), unless the authorization is terminated  or revoked sooner.       Influenza A by PCR NEGATIVE NEGATIVE Final   Influenza B by PCR NEGATIVE NEGATIVE Final    Comment: (NOTE) The Xpert Xpress SARS-CoV-2/FLU/RSV plus assay is intended as an aid in the diagnosis of influenza from Nasopharyngeal swab specimens and should not be used as a sole basis for treatment. Nasal washings and aspirates are unacceptable for Xpert Xpress SARS-CoV-2/FLU/RSV testing.  Fact Sheet for Patients: bloggercourse.com  Fact Sheet for Healthcare Providers: seriousbroker.it  This test is not yet approved or cleared by the [place]  FDA and has been authorized for detection and/or  diagnosis of SARS-CoV-2 by FDA under an Emergency Use Authorization (EUA). This EUA will remain in effect (meaning this test can be used) for the duration of the COVID-19 declaration under Section 564(b)(1) of the Act, 21 U.S.C. section 360bbb-3(b)(1), unless the authorization is terminated or revoked.     Resp Syncytial Virus by PCR NEGATIVE NEGATIVE Final    Comment: (NOTE) Fact Sheet for Patients: bloggercourse.com  Fact Sheet for Healthcare Providers: seriousbroker.it  This test is not yet approved or cleared by the [place]  FDA and has been authorized for detection and/or diagnosis of SARS-CoV-2 by FDA under an Emergency Use Authorization (EUA). This EUA will remain in effect (meaning this test can be used) for the duration of the COVID-19 declaration under Section 564(b)(1) of the Act, 21 U.S.C. section 360bbb-3(b)(1), unless the authorization is terminated or revoked.  Performed at [facility], [address], [place], [place] [address]   Blood Culture (routine x 2)  Status: Abnormal   Collection Time: 06/11/23  5:08 PM   Specimen: BLOOD  Result Value Ref Range Status   Specimen Description   Final    BLOOD RIGHT ANTECUBITAL Performed at [facility], [address], [place], [place] [address]    Special Requests   Final    BOTTLES DRAWN AEROBIC AND ANAEROBIC Blood Culture adequate volume Performed at [facility], [address] [place], [place] [address]    Culture  Setup Time   Final    GRAM NEGATIVE RODS IN BOTH AEROBIC AND ANAEROBIC BOTTLES CRITICAL RESULT CALLED TO, READ BACK BY AND VERIFIED WITH: PHARMD A.ARLINGTON AT 9073 ON 06/12/2023 BY T.SAAD. Performed at [facility], [address], [place], [place] [address]    Culture KLEBSIELLA ORNITHINOLYTICA (A)  Final   Report Status 06/14/2023 FINAL  Final   Organism ID, Bacteria  KLEBSIELLA ORNITHINOLYTICA  Final      Susceptibility   Klebsiella ornithinolytica - MIC*    AMPICILLIN  RESISTANT Resistant     CEFEPIME  <=0.12 SENSITIVE Sensitive     CEFTAZIDIME <=1 SENSITIVE Sensitive     CEFTRIAXONE  <=0.25 SENSITIVE Sensitive     CIPROFLOXACIN <=0.25 SENSITIVE Sensitive     GENTAMICIN <=1 SENSITIVE Sensitive     IMIPENEM <=0.25 SENSITIVE Sensitive     TRIMETH/SULFA <=20 SENSITIVE Sensitive     AMPICILLIN /SULBACTAM <=2 SENSITIVE Sensitive     PIP/TAZO <=4 SENSITIVE Sensitive ug/mL    * KLEBSIELLA ORNITHINOLYTICA  Blood Culture (routine x 2)     Status: Abnormal   Collection Time: 06/11/23  5:08 PM   Specimen: BLOOD  Result Value Ref Range Status   Specimen Description   Final    BLOOD LEFT ANTECUBITAL Performed at [facility], [address] [place], [place] [address]    Special Requests   Final    BOTTLES DRAWN AEROBIC AND ANAEROBIC Blood Culture adequate volume Performed at [facility], [address] [place], [place] [address]    Culture  Setup Time   Final    GRAM NEGATIVE RODS IN BOTH AEROBIC AND ANAEROBIC BOTTLES CRITICAL VALUE NOTED.  VALUE IS CONSISTENT WITH PREVIOUSLY REPORTED AND CALLED VALUE. GRAM POSITIVE COCCI AEROBIC BOTTLE ONLY CRITICAL RESULT CALLED TO, READ BACK BY AND VERIFIED WITH: PHARMD A ALLINGTON [id number] AT 1125 BY CM    Culture (A)  Final    KLEBSIELLA ORNITHINOLYTICA SUSCEPTIBILITIES PERFORMED ON PREVIOUS CULTURE WITHIN THE LAST 5 DAYS. STAPHYLOCOCCUS HAEMOLYTICUS THE SIGNIFICANCE OF ISOLATING THIS ORGANISM FROM A SINGLE SET OF BLOOD CULTURES WHEN MULTIPLE SETS ARE DRAWN IS UNCERTAIN. PLEASE NOTIFY THE MICROBIOLOGY DEPARTMENT WITHIN ONE WEEK IF SPECIATION AND SENSITIVITIES ARE REQUIRED. Performed at [facility], [address], [place], [place] [address]    Report Status 06/15/2023 FINAL  Final  Blood Culture ID Panel (Reflexed)     Status: Abnormal   Collection Time: 06/11/23  5:08  PM  Result Value Ref Range Status   Enterococcus faecalis NOT DETECTED NOT DETECTED Final   Enterococcus Faecium NOT DETECTED NOT DETECTED Final   Listeria monocytogenes NOT DETECTED NOT DETECTED Final   Staphylococcus species NOT DETECTED NOT DETECTED Final   Staphylococcus aureus (BCID) NOT DETECTED NOT DETECTED Final   Staphylococcus epidermidis NOT DETECTED NOT DETECTED Final   Staphylococcus lugdunensis NOT DETECTED NOT DETECTED Final   Streptococcus species NOT DETECTED NOT DETECTED Final   Streptococcus agalactiae NOT DETECTED NOT DETECTED Final   Streptococcus pneumoniae NOT DETECTED NOT  DETECTED Final   Streptococcus pyogenes NOT DETECTED NOT DETECTED Final   A.calcoaceticus-baumannii NOT DETECTED NOT DETECTED Final   Bacteroides fragilis NOT DETECTED NOT DETECTED Final   Enterobacterales DETECTED (A) NOT DETECTED Final    Comment: Enterobacterales represent a large order of gram negative bacteria, not a single organism. Refer to culture for further identification. CRITICAL RESULT CALLED TO, READ BACK BY AND VERIFIED WITH: PHARMD A.ARLINGTON AT 9073 ON 06/12/2023 BY T.SAAD.    Enterobacter cloacae complex NOT DETECTED NOT DETECTED Final   Escherichia coli NOT DETECTED NOT DETECTED Final   Klebsiella aerogenes NOT DETECTED NOT DETECTED Final   Klebsiella oxytoca NOT DETECTED NOT DETECTED Final   Klebsiella pneumoniae NOT DETECTED NOT DETECTED Final   Proteus species NOT DETECTED NOT DETECTED Final   Salmonella species NOT DETECTED NOT DETECTED Final   Serratia marcescens NOT DETECTED NOT DETECTED Final   Haemophilus influenzae NOT DETECTED NOT DETECTED Final   Neisseria meningitidis NOT DETECTED NOT DETECTED Final   Pseudomonas aeruginosa NOT DETECTED NOT DETECTED Final   Stenotrophomonas maltophilia NOT DETECTED NOT DETECTED Final   Candida albicans NOT DETECTED NOT DETECTED Final   Candida auris NOT DETECTED NOT DETECTED Final   Candida glabrata NOT DETECTED NOT DETECTED  Final   Candida krusei NOT DETECTED NOT DETECTED Final   Candida parapsilosis NOT DETECTED NOT DETECTED Final   Candida tropicalis NOT DETECTED NOT DETECTED Final   Cryptococcus neoformans/gattii NOT DETECTED NOT DETECTED Final   CTX-M ESBL NOT DETECTED NOT DETECTED Final   Carbapenem resistance IMP NOT DETECTED NOT DETECTED Final   Carbapenem resistance KPC NOT DETECTED NOT DETECTED Final   Carbapenem resistance NDM NOT DETECTED NOT DETECTED Final   Carbapenem resist OXA 48 LIKE NOT DETECTED NOT DETECTED Final   Carbapenem resistance VIM NOT DETECTED NOT DETECTED Final    Comment: Performed at [facility], [address] [place], [place] [address]  Blood Culture ID Panel (Reflexed)     Status: Abnormal   Collection Time: 06/11/23  5:08 PM  Result Value Ref Range Status   Enterococcus faecalis NOT DETECTED NOT DETECTED Final   Enterococcus Faecium NOT DETECTED NOT DETECTED Final   Listeria monocytogenes NOT DETECTED NOT DETECTED Final   Staphylococcus species DETECTED (A) NOT DETECTED Final    Comment: CRITICAL RESULT CALLED TO, READ BACK BY AND VERIFIED WITH: PHARMD A ALLINGTON [id number] AT 1125 BY CM    Staphylococcus aureus (BCID) NOT DETECTED NOT DETECTED Final   Staphylococcus epidermidis NOT DETECTED NOT DETECTED Final   Staphylococcus lugdunensis NOT DETECTED NOT DETECTED Final   Streptococcus species NOT DETECTED NOT DETECTED Final   Streptococcus agalactiae NOT DETECTED NOT DETECTED Final   Streptococcus pneumoniae NOT DETECTED NOT DETECTED Final   Streptococcus pyogenes NOT DETECTED NOT DETECTED Final   A.calcoaceticus-baumannii NOT DETECTED NOT DETECTED Final   Bacteroides fragilis NOT DETECTED NOT DETECTED Final   Enterobacterales DETECTED (A) NOT DETECTED Final    Comment: Enterobacterales represent a large order of gram negative bacteria, not a single organism. Refer to culture for further identification. CRITICAL RESULT CALLED TO, READ BACK BY AND VERIFIED  WITH: PHARMD A ALLINGTON [id number] AT 1125 BY CM    Enterobacter cloacae complex NOT DETECTED NOT DETECTED Final   Escherichia coli NOT DETECTED NOT DETECTED Final   Klebsiella aerogenes NOT DETECTED NOT DETECTED Final   Klebsiella oxytoca NOT DETECTED NOT DETECTED Final   Klebsiella pneumoniae NOT DETECTED NOT DETECTED Final   Proteus species NOT DETECTED NOT DETECTED  Final   Salmonella species NOT DETECTED NOT DETECTED Final   Serratia marcescens NOT DETECTED NOT DETECTED Final   Haemophilus influenzae NOT DETECTED NOT DETECTED Final   Neisseria meningitidis NOT DETECTED NOT DETECTED Final   Pseudomonas aeruginosa NOT DETECTED NOT DETECTED Final   Stenotrophomonas maltophilia NOT DETECTED NOT DETECTED Final   Candida albicans NOT DETECTED NOT DETECTED Final   Candida auris NOT DETECTED NOT DETECTED Final   Candida glabrata NOT DETECTED NOT DETECTED Final   Candida krusei NOT DETECTED NOT DETECTED Final   Candida parapsilosis NOT DETECTED NOT DETECTED Final   Candida tropicalis NOT DETECTED NOT DETECTED Final   Cryptococcus neoformans/gattii NOT DETECTED NOT DETECTED Final   CTX-M ESBL NOT DETECTED NOT DETECTED Final   Carbapenem resistance IMP NOT DETECTED NOT DETECTED Final   Carbapenem resistance KPC NOT DETECTED NOT DETECTED Final   Carbapenem resistance NDM NOT DETECTED NOT DETECTED Final   Carbapenem resist OXA 48 LIKE NOT DETECTED NOT DETECTED Final   Carbapenem resistance VIM NOT DETECTED NOT DETECTED Final    Comment: Performed at [facility], [address] [place], [place] [address]  Blood culture (routine x 2)     Status: Abnormal   Collection Time: 06/11/23  8:45 PM   Specimen: BLOOD RIGHT HAND  Result Value Ref Range Status   Specimen Description   Final    BLOOD RIGHT HAND Performed at [facility], [address] [place], [place] [address]    Special Requests   Final    BOTTLES DRAWN AEROBIC AND ANAEROBIC Blood Culture results may not be optimal due  to an inadequate volume of blood received in culture bottles Performed at [facility], [address], [place], [place] [address]    Culture  Setup Time   Final    GRAM NEGATIVE RODS ANAEROBIC BOTTLE ONLY CRITICAL VALUE NOTED.  VALUE IS CONSISTENT WITH PREVIOUSLY REPORTED AND CALLED VALUE.    Culture (A)  Final    KLEBSIELLA ORNITHINOLYTICA SUSCEPTIBILITIES PERFORMED ON PREVIOUS CULTURE WITHIN THE LAST 5 DAYS. STENOTROPHOMONAS MALTOPHILIA CRITICAL RESULT CALLED TO, READ BACK BY AND VERIFIED WITH: PHARMD CHRISTELLA MILLET [id number] AT 920 BY CM Performed at [facility], [address] [place], [place] [address]    Report Status 06/17/2023 FINAL  Final   Organism ID, Bacteria STENOTROPHOMONAS MALTOPHILIA  Final      Susceptibility   Stenotrophomonas maltophilia - MIC*    LEVOFLOXACIN 0.25 SENSITIVE Sensitive     TRIMETH/SULFA <=20 SENSITIVE Sensitive     * STENOTROPHOMONAS MALTOPHILIA  Blood culture (routine x 2)     Status: Abnormal   Collection Time: 06/11/23  9:00 PM   Specimen: BLOOD  Result Value Ref Range Status   Specimen Description   Final    BLOOD LEFT ANTECUBITAL Performed at [facility], [address] [place], [place] [address]    Special Requests   Final    BOTTLES DRAWN AEROBIC AND ANAEROBIC Blood Culture results may not be optimal due to an inadequate volume of blood received in culture bottles Performed at [facility], [address], [place], [place] [address]    Culture  Setup Time   Final    GRAM NEGATIVE RODS AEROBIC BOTTLE ONLY CRITICAL VALUE NOTED.  VALUE IS CONSISTENT WITH PREVIOUSLY REPORTED AND CALLED VALUE.    Culture (A)  Final    KLEBSIELLA ORNITHINOLYTICA SUSCEPTIBILITIES PERFORMED ON PREVIOUS CULTURE WITHIN THE LAST 5 DAYS. Performed at  [facility], [address], [place], [place] [address]    Report Status 06/14/2023 FINAL  Final  Cath Tip Culture     Status: Abnormal    Collection Time: 06/12/23  5:14 PM   Specimen: Catheter Tip  Result Value Ref Range Status   Specimen Description   Final    CATH TIP Performed at [facility], [address] [place], [place] [address]    Special Requests   Final    NONE Performed at [facility], [address], [place], [place] [address]    Culture RAOULTELLA PLANTICOLA (A)  Final   Report Status 06/14/2023 FINAL  Final   Organism ID, Bacteria RAOULTELLA PLANTICOLA  Final      Susceptibility   Raoultella planticola - MIC*    AMPICILLIN  >=32 RESISTANT Resistant     CEFEPIME  <=0.12 SENSITIVE Sensitive     CEFTAZIDIME <=1 SENSITIVE Sensitive     CEFTRIAXONE  <=0.25 SENSITIVE Sensitive     CIPROFLOXACIN <=0.25 SENSITIVE Sensitive     GENTAMICIN <=1 SENSITIVE Sensitive     IMIPENEM 0.5 SENSITIVE Sensitive     TRIMETH/SULFA <=20 SENSITIVE Sensitive     AMPICILLIN /SULBACTAM 4 SENSITIVE Sensitive     PIP/TAZO 8 SENSITIVE Sensitive ug/mL    * RAOULTELLA PLANTICOLA  Culture, blood (Routine X 2) w Reflex to ID Panel     Status: None (Preliminary result)   Collection Time: 06/16/23  2:16 PM   Specimen: BLOOD RIGHT ARM  Result Value Ref Range Status   Specimen Description   Final    BLOOD RIGHT ARM Performed at [facility], [address], [place], [place] [address]    Special Requests   Final    BOTTLES DRAWN AEROBIC AND ANAEROBIC Blood Culture results may not be optimal due to an inadequate volume of blood received in culture bottles Performed at [facility], [address], [place], [place] [address]    Culture   Final    NO GROWTH < 24 HOURS Performed at [facility], [address], [place], [place] [address]    Report Status PENDING  Incomplete  Culture, blood (Routine X 2) w Reflex to ID Panel     Status: None (Preliminary result)   Collection Time: 06/16/23  2:21 PM   Specimen: BLOOD RIGHT ARM  Result Value Ref Range Status   Specimen  Description   Final    BLOOD RIGHT ARM Performed at [facility], [address], [place], [place] [address]    Special Requests   Final    BOTTLES DRAWN AEROBIC AND ANAEROBIC Blood Culture results may not be optimal due to an inadequate volume of blood received in culture bottles Performed at [facility], [address] [place], [place] [address]    Culture   Final    NO GROWTH < 24 HOURS Performed at [facility], [address] [place], [place] [address]    Report Status PENDING  Incomplete      Studies:  No results found.   Assessment: 68 y.o. 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  I will refer him to radiation oncology for palliative radiation after discharge. -Unfortunately his prognosis is poor due to the bulky disease, and the limited treatment options.  I will see him after hospital discharge.   Onita Mattock, MD 06/17/2023  12:02 PM

## 2023-06-17 NOTE — Progress Notes (Signed)
 Subjective:  He is complaining of worsening pain on his left flank where he has a group of LN conglomerarting and pushing into the psoas muscle  Antibiotics:  Anti-infectives (From admission, onward)    Start     Dose/Rate Route Frequency Ordered Stop   06/15/23 1000  Ampicillin -Sulbactam (UNASYN ) 3 g in sodium chloride  0.9 % 100 mL IVPB        3 g 200 mL/hr over 30 Minutes Intravenous Every 6 hours 06/15/23 0851     06/14/23 2200  vancomycin  (VANCOREADY) IVPB 1500 mg/300 mL  Status:  Discontinued        1,500 mg 150 mL/hr over 120 Minutes Intravenous Every 24 hours 06/14/23 1405 06/15/23 0851   06/13/23 1700  ceFEPIme  (MAXIPIME ) 2 g in sodium chloride  0.9 % 100 mL IVPB  Status:  Discontinued        2 g 200 mL/hr over 30 Minutes Intravenous Every 8 hours 06/13/23 1425 06/15/23 0851   06/12/23 2200  vancomycin  (VANCOREADY) IVPB 1250 mg/250 mL  Status:  Discontinued        1,250 mg 166.7 mL/hr over 90 Minutes Intravenous Every 24 hours 06/12/23 1205 06/14/23 1405   06/12/23 1800  vancomycin  (VANCOCIN ) IVPB 1000 mg/200 mL premix  Status:  Discontinued        1,000 mg 200 mL/hr over 60 Minutes Intravenous Every 24 hours 06/11/23 2232 06/12/23 0946   06/12/23 0900  ceFEPIme  (MAXIPIME ) 2 g in sodium chloride  0.9 % 100 mL IVPB  Status:  Discontinued        2 g 200 mL/hr over 30 Minutes Intravenous Every 12 hours 06/11/23 2232 06/13/23 1425   06/11/23 2200  metroNIDAZOLE  (FLAGYL ) IVPB 500 mg  Status:  Discontinued        500 mg 100 mL/hr over 60 Minutes Intravenous Every 12 hours 06/11/23 2145 06/12/23 0946   06/11/23 2130  vancomycin  (VANCOREADY) IVPB 1250 mg/250 mL        1,250 mg 166.7 mL/hr over 90 Minutes Intravenous  Once 06/11/23 2046 06/11/23 2316   06/11/23 2100  ceFEPIme  (MAXIPIME ) 2 g in sodium chloride  0.9 % 100 mL IVPB        2 g 200 mL/hr over 30 Minutes Intravenous  Once 06/11/23 2046 06/11/23 2135       Medications: Scheduled Meds:  dicyclomine   10 mg  Oral TID AC & HS   insulin  aspart  0-5 Units Subcutaneous QHS   insulin  aspart  0-9 Units Subcutaneous TID WC   insulin  glargine-yfgn  15 Units Subcutaneous Daily   methocarbamol   1,000 mg Oral TID   pantoprazole   40 mg Oral BID AC   polyethylene glycol  17 g Oral Daily   senna-docusate  2 tablet Oral BID   Continuous Infusions:  ampicillin -sulbactam (UNASYN ) IV 3 g (06/17/23 1011)   PRN Meds:.acetaminophen  **OR** acetaminophen , feeding supplement, HYDROcodone -acetaminophen , ondansetron  **OR** ondansetron  (ZOFRAN ) IV, mouth rinse, polyvinyl alcohol     Objective: Weight change:  No intake or output data in the 24 hours ending 06/17/23 1408  Blood pressure 127/75, pulse 92, temperature (!) 97.4 F (36.3 C), temperature source Oral, resp. rate 16, height 5' 10 (1.778 m), weight 63.5 kg, SpO2 100%. Temp:  [97.4 F (36.3 C)-99.6 F (37.6 C)] 97.4 F (36.3 C) (02/05 1214) Pulse Rate:  [77-92] 92 (02/05 1214) Resp:  [16-20] 16 (02/05 1214) BP: (127-143)/(73-90) 127/75 (02/05 1214) SpO2:  [98 %-100 %] 100 % (02/05 1214)  Physical Exam: Physical  Exam Constitutional:      Appearance: He is well-developed.  HENT:     Head: Normocephalic and atraumatic.  Eyes:     Conjunctiva/sclera: Conjunctivae normal.  Cardiovascular:     Rate and Rhythm: Normal rate and regular rhythm.  Pulmonary:     Effort: Pulmonary effort is normal. No respiratory distress.     Breath sounds: No wheezing.  Abdominal:     General: There is no distension.     Palpations: Abdomen is soft.     Tenderness: There is left CVA tenderness.  Musculoskeletal:        General: Normal range of motion.     Cervical back: Normal range of motion and neck supple.  Skin:    General: Skin is warm and dry.     Findings: No erythema or rash.  Neurological:     General: No focal deficit present.     Mental Status: He is alert and oriented to person, place, and time.  Psychiatric:        Mood and Affect: Mood normal.         Behavior: Behavior normal.        Thought Content: Thought content normal.        Judgment: Judgment normal.      CBC:    BMET Recent Labs    06/16/23 0438 06/17/23 0437  NA 135 136  K 3.3* 3.6  CL 102 105  CO2 23 23  GLUCOSE 172* 194*  BUN 11 9  CREATININE 0.58* 0.72  CALCIUM 8.5* 8.2*     Liver Panel  No results for input(s): PROT, ALBUMIN , AST, ALT, ALKPHOS, BILITOT, BILIDIR, IBILI in the last 72 hours.     Sedimentation Rate No results for input(s): ESRSEDRATE in the last 72 hours. C-Reactive Protein No results for input(s): CRP in the last 72 hours.  Micro Results: Recent Results (from the past 720 hours)  Resp panel by RT-PCR (RSV, Flu A&B, Covid) Anterior Nasal Swab     Status: None   Collection Time: 06/11/23  4:52 PM   Specimen: Anterior Nasal Swab  Result Value Ref Range Status   SARS Coronavirus 2 by RT PCR NEGATIVE NEGATIVE Final    Comment: (NOTE) SARS-CoV-2 target nucleic acids are NOT DETECTED.  The SARS-CoV-2 RNA is generally detectable in upper respiratory specimens during the acute phase of infection. The lowest concentration of SARS-CoV-2 viral copies this assay can detect is 138 copies/mL. A negative result does not preclude SARS-Cov-2 infection and should not be used as the sole basis for treatment or other patient management decisions. A negative result may occur with  improper specimen collection/handling, submission of specimen other than nasopharyngeal swab, presence of viral mutation(s) within the areas targeted by this assay, and inadequate number of viral copies(<138 copies/mL). A negative result must be combined with clinical observations, patient history, and epidemiological information. The expected result is Negative.  Fact Sheet for Patients:  bloggercourse.com  Fact Sheet for Healthcare Providers:  seriousbroker.it  This test is no t yet  approved or cleared by the [place]  FDA and  has been authorized for detection and/or diagnosis of SARS-CoV-2 by FDA under an Emergency Use Authorization (EUA). This EUA will remain  in effect (meaning this test can be used) for the duration of the COVID-19 declaration under Section 564(b)(1) of the Act, 21 U.S.C.section 360bbb-3(b)(1), unless the authorization is terminated  or revoked sooner.       Influenza A by PCR NEGATIVE NEGATIVE Final  Influenza B by PCR NEGATIVE NEGATIVE Final    Comment: (NOTE) The Xpert Xpress SARS-CoV-2/FLU/RSV plus assay is intended as an aid in the diagnosis of influenza from Nasopharyngeal swab specimens and should not be used as a sole basis for treatment. Nasal washings and aspirates are unacceptable for Xpert Xpress SARS-CoV-2/FLU/RSV testing.  Fact Sheet for Patients: bloggercourse.com  Fact Sheet for Healthcare Providers: seriousbroker.it  This test is not yet approved or cleared by the [place]  FDA and has been authorized for detection and/or diagnosis of SARS-CoV-2 by FDA under an Emergency Use Authorization (EUA). This EUA will remain in effect (meaning this test can be used) for the duration of the COVID-19 declaration under Section 564(b)(1) of the Act, 21 U.S.C. section 360bbb-3(b)(1), unless the authorization is terminated or revoked.     Resp Syncytial Virus by PCR NEGATIVE NEGATIVE Final    Comment: (NOTE) Fact Sheet for Patients: bloggercourse.com  Fact Sheet for Healthcare Providers: seriousbroker.it  This test is not yet approved or cleared by the [place]  FDA and has been authorized for detection and/or diagnosis of SARS-CoV-2 by FDA under an Emergency Use Authorization (EUA). This EUA will remain in effect (meaning this test can be used) for the duration of the COVID-19 declaration under Section 564(b)(1) of  the Act, 21 U.S.C. section 360bbb-3(b)(1), unless the authorization is terminated or revoked.  Performed at [facility], [address] [place], [place] [address]   Blood Culture (routine x 2)     Status: Abnormal   Collection Time: 06/11/23  5:08 PM   Specimen: BLOOD  Result Value Ref Range Status   Specimen Description   Final    BLOOD RIGHT ANTECUBITAL Performed at [facility], [address], [place], [place] [address]    Special Requests   Final    BOTTLES DRAWN AEROBIC AND ANAEROBIC Blood Culture adequate volume Performed at [facility], [address] [place], [place] [address]    Culture  Setup Time   Final    GRAM NEGATIVE RODS IN BOTH AEROBIC AND ANAEROBIC BOTTLES CRITICAL RESULT CALLED TO, READ BACK BY AND VERIFIED WITH: PHARMD A.ARLINGTON AT 9073 ON 06/12/2023 BY T.SAAD. Performed at [facility], [address] [place], [place] [address]    Culture KLEBSIELLA ORNITHINOLYTICA (A)  Final   Report Status 06/14/2023 FINAL  Final   Organism ID, Bacteria KLEBSIELLA ORNITHINOLYTICA  Final      Susceptibility   Klebsiella ornithinolytica - MIC*    AMPICILLIN  RESISTANT Resistant     CEFEPIME  <=0.12 SENSITIVE Sensitive     CEFTAZIDIME <=1 SENSITIVE Sensitive     CEFTRIAXONE  <=0.25 SENSITIVE Sensitive     CIPROFLOXACIN <=0.25 SENSITIVE Sensitive     GENTAMICIN <=1 SENSITIVE Sensitive     IMIPENEM <=0.25 SENSITIVE Sensitive     TRIMETH/SULFA <=20 SENSITIVE Sensitive     AMPICILLIN /SULBACTAM <=2 SENSITIVE Sensitive     PIP/TAZO <=4 SENSITIVE Sensitive ug/mL    * KLEBSIELLA ORNITHINOLYTICA  Blood Culture (routine x 2)     Status: Abnormal   Collection Time: 06/11/23  5:08 PM   Specimen: BLOOD  Result Value Ref Range Status   Specimen Description   Final    BLOOD LEFT ANTECUBITAL Performed at [facility], [address], [place], [place] [address]    Special Requests   Final     BOTTLES DRAWN AEROBIC AND ANAEROBIC Blood Culture adequate volume Performed at [facility], [address]  [place], [place] [address]    Culture  Setup Time   Final    GRAM NEGATIVE RODS IN BOTH AEROBIC AND ANAEROBIC BOTTLES CRITICAL VALUE NOTED.  VALUE IS CONSISTENT WITH PREVIOUSLY REPORTED AND CALLED VALUE. GRAM POSITIVE COCCI AEROBIC BOTTLE ONLY CRITICAL RESULT CALLED TO, READ BACK BY AND VERIFIED WITH: PHARMD A ALLINGTON [id number] AT 1125 BY CM    Culture (A)  Final    KLEBSIELLA ORNITHINOLYTICA SUSCEPTIBILITIES PERFORMED ON PREVIOUS CULTURE WITHIN THE LAST 5 DAYS. STAPHYLOCOCCUS HAEMOLYTICUS THE SIGNIFICANCE OF ISOLATING THIS ORGANISM FROM A SINGLE SET OF BLOOD CULTURES WHEN MULTIPLE SETS ARE DRAWN IS UNCERTAIN. PLEASE NOTIFY THE MICROBIOLOGY DEPARTMENT WITHIN ONE WEEK IF SPECIATION AND SENSITIVITIES ARE REQUIRED. Performed at [facility], [address] [place], [place] [address]    Report Status 06/15/2023 FINAL  Final  Blood Culture ID Panel (Reflexed)     Status: Abnormal   Collection Time: 06/11/23  5:08 PM  Result Value Ref Range Status   Enterococcus faecalis NOT DETECTED NOT DETECTED Final   Enterococcus Faecium NOT DETECTED NOT DETECTED Final   Listeria monocytogenes NOT DETECTED NOT DETECTED Final   Staphylococcus species NOT DETECTED NOT DETECTED Final   Staphylococcus aureus (BCID) NOT DETECTED NOT DETECTED Final   Staphylococcus epidermidis NOT DETECTED NOT DETECTED Final   Staphylococcus lugdunensis NOT DETECTED NOT DETECTED Final   Streptococcus species NOT DETECTED NOT DETECTED Final   Streptococcus agalactiae NOT DETECTED NOT DETECTED Final   Streptococcus pneumoniae NOT DETECTED NOT DETECTED Final   Streptococcus pyogenes NOT DETECTED NOT DETECTED Final   A.calcoaceticus-baumannii NOT DETECTED NOT DETECTED Final   Bacteroides fragilis NOT DETECTED NOT DETECTED Final   Enterobacterales DETECTED (A) NOT DETECTED Final    Comment:  Enterobacterales represent a large order of gram negative bacteria, not a single organism. Refer to culture for further identification. CRITICAL RESULT CALLED TO, READ BACK BY AND VERIFIED WITH: PHARMD A.ARLINGTON AT 9073 ON 06/12/2023 BY T.SAAD.    Enterobacter cloacae complex NOT DETECTED NOT DETECTED Final   Escherichia coli NOT DETECTED NOT DETECTED Final   Klebsiella aerogenes NOT DETECTED NOT DETECTED Final   Klebsiella oxytoca NOT DETECTED NOT DETECTED Final   Klebsiella pneumoniae NOT DETECTED NOT DETECTED Final   Proteus species NOT DETECTED NOT DETECTED Final   Salmonella species NOT DETECTED NOT DETECTED Final   Serratia marcescens NOT DETECTED NOT DETECTED Final   Haemophilus influenzae NOT DETECTED NOT DETECTED Final   Neisseria meningitidis NOT DETECTED NOT DETECTED Final   Pseudomonas aeruginosa NOT DETECTED NOT DETECTED Final   Stenotrophomonas maltophilia NOT DETECTED NOT DETECTED Final   Candida albicans NOT DETECTED NOT DETECTED Final   Candida auris NOT DETECTED NOT DETECTED Final   Candida glabrata NOT DETECTED NOT DETECTED Final   Candida krusei NOT DETECTED NOT DETECTED Final   Candida parapsilosis NOT DETECTED NOT DETECTED Final   Candida tropicalis NOT DETECTED NOT DETECTED Final   Cryptococcus neoformans/gattii NOT DETECTED NOT DETECTED Final   CTX-M ESBL NOT DETECTED NOT DETECTED Final   Carbapenem resistance IMP NOT DETECTED NOT DETECTED Final   Carbapenem resistance KPC NOT DETECTED NOT DETECTED Final   Carbapenem resistance NDM NOT DETECTED NOT DETECTED Final   Carbapenem resist OXA 48 LIKE NOT DETECTED NOT DETECTED Final   Carbapenem resistance VIM NOT DETECTED NOT DETECTED Final    Comment: Performed at [facility], [address] [place], [place] [address]  Blood Culture ID Panel (Reflexed)     Status: Abnormal  Collection Time: 06/11/23  5:08 PM  Result Value Ref Range Status   Enterococcus faecalis NOT DETECTED NOT DETECTED Final    Enterococcus Faecium NOT DETECTED NOT DETECTED Final   Listeria monocytogenes NOT DETECTED NOT DETECTED Final   Staphylococcus species DETECTED (A) NOT DETECTED Final    Comment: CRITICAL RESULT CALLED TO, READ BACK BY AND VERIFIED WITH: PHARMD A ALLINGTON [id number] AT 1125 BY CM    Staphylococcus aureus (BCID) NOT DETECTED NOT DETECTED Final   Staphylococcus epidermidis NOT DETECTED NOT DETECTED Final   Staphylococcus lugdunensis NOT DETECTED NOT DETECTED Final   Streptococcus species NOT DETECTED NOT DETECTED Final   Streptococcus agalactiae NOT DETECTED NOT DETECTED Final   Streptococcus pneumoniae NOT DETECTED NOT DETECTED Final   Streptococcus pyogenes NOT DETECTED NOT DETECTED Final   A.calcoaceticus-baumannii NOT DETECTED NOT DETECTED Final   Bacteroides fragilis NOT DETECTED NOT DETECTED Final   Enterobacterales DETECTED (A) NOT DETECTED Final    Comment: Enterobacterales represent a large order of gram negative bacteria, not a single organism. Refer to culture for further identification. CRITICAL RESULT CALLED TO, READ BACK BY AND VERIFIED WITH: PHARMD A ALLINGTON [id number] AT 1125 BY CM    Enterobacter cloacae complex NOT DETECTED NOT DETECTED Final   Escherichia coli NOT DETECTED NOT DETECTED Final   Klebsiella aerogenes NOT DETECTED NOT DETECTED Final   Klebsiella oxytoca NOT DETECTED NOT DETECTED Final   Klebsiella pneumoniae NOT DETECTED NOT DETECTED Final   Proteus species NOT DETECTED NOT DETECTED Final   Salmonella species NOT DETECTED NOT DETECTED Final   Serratia marcescens NOT DETECTED NOT DETECTED Final   Haemophilus influenzae NOT DETECTED NOT DETECTED Final   Neisseria meningitidis NOT DETECTED NOT DETECTED Final   Pseudomonas aeruginosa NOT DETECTED NOT DETECTED Final   Stenotrophomonas maltophilia NOT DETECTED NOT DETECTED Final   Candida albicans NOT DETECTED NOT DETECTED Final   Candida auris NOT DETECTED NOT DETECTED Final   Candida glabrata NOT DETECTED NOT  DETECTED Final   Candida krusei NOT DETECTED NOT DETECTED Final   Candida parapsilosis NOT DETECTED NOT DETECTED Final   Candida tropicalis NOT DETECTED NOT DETECTED Final   Cryptococcus neoformans/gattii NOT DETECTED NOT DETECTED Final   CTX-M ESBL NOT DETECTED NOT DETECTED Final   Carbapenem resistance IMP NOT DETECTED NOT DETECTED Final   Carbapenem resistance KPC NOT DETECTED NOT DETECTED Final   Carbapenem resistance NDM NOT DETECTED NOT DETECTED Final   Carbapenem resist OXA 48 LIKE NOT DETECTED NOT DETECTED Final   Carbapenem resistance VIM NOT DETECTED NOT DETECTED Final    Comment: Performed at [facility], [address], [place], [place] [address]  Blood culture (routine x 2)     Status: Abnormal   Collection Time: 06/11/23  8:45 PM   Specimen: BLOOD RIGHT HAND  Result Value Ref Range Status   Specimen Description   Final    BLOOD RIGHT HAND Performed at [facility], [address] [place], [place] [address]    Special Requests   Final    BOTTLES DRAWN AEROBIC AND ANAEROBIC Blood Culture results may not be optimal due to an inadequate volume of blood received in culture bottles Performed at [facility], [address] [place], [place] [address]    Culture  Setup Time   Final    GRAM NEGATIVE RODS ANAEROBIC BOTTLE ONLY CRITICAL VALUE NOTED.  VALUE IS CONSISTENT WITH PREVIOUSLY REPORTED AND CALLED VALUE.    Culture (A)  Final  KLEBSIELLA ORNITHINOLYTICA SUSCEPTIBILITIES PERFORMED ON PREVIOUS CULTURE WITHIN THE LAST 5 DAYS. STENOTROPHOMONAS MALTOPHILIA CRITICAL RESULT CALLED TO, READ BACK BY AND VERIFIED WITH: PHARMD CHRISTELLA MILLET [id number] AT 920 BY CM Performed at [facility], [address] [place], [place] [address]    Report Status 06/17/2023 FINAL  Final   Organism ID, Bacteria STENOTROPHOMONAS MALTOPHILIA  Final      Susceptibility   Stenotrophomonas maltophilia - MIC*    LEVOFLOXACIN 0.25 SENSITIVE Sensitive      TRIMETH/SULFA <=20 SENSITIVE Sensitive     * STENOTROPHOMONAS MALTOPHILIA  Blood culture (routine x 2)     Status: Abnormal   Collection Time: 06/11/23  9:00 PM   Specimen: BLOOD  Result Value Ref Range Status   Specimen Description   Final    BLOOD LEFT ANTECUBITAL Performed at [facility], [address] [place], [place] [address]    Special Requests   Final    BOTTLES DRAWN AEROBIC AND ANAEROBIC Blood Culture results may not be optimal due to an inadequate volume of blood received in culture bottles Performed at [facility], [address], [place], [place] [address]    Culture  Setup Time   Final    GRAM NEGATIVE RODS AEROBIC BOTTLE ONLY CRITICAL VALUE NOTED.  VALUE IS CONSISTENT WITH PREVIOUSLY REPORTED AND CALLED VALUE.    Culture (A)  Final    KLEBSIELLA ORNITHINOLYTICA SUSCEPTIBILITIES PERFORMED ON PREVIOUS CULTURE WITHIN THE LAST 5 DAYS. Performed at [facility], [address] [place], [place] [address]    Report Status 06/14/2023 FINAL  Final  Cath Tip Culture     Status: Abnormal   Collection Time: 06/12/23  5:14 PM   Specimen: Catheter Tip  Result Value Ref Range Status   Specimen Description   Final    CATH TIP Performed at  [facility], [address] [place], [place] [address]    Special Requests   Final    NONE Performed at [facility], [address], [place], [place] [address]    Culture RAOULTELLA PLANTICOLA (A)  Final   Report Status 06/14/2023 FINAL  Final   Organism ID, Bacteria RAOULTELLA PLANTICOLA  Final      Susceptibility   Raoultella planticola - MIC*    AMPICILLIN  >=32 RESISTANT Resistant     CEFEPIME  <=0.12 SENSITIVE Sensitive     CEFTAZIDIME <=1 SENSITIVE Sensitive     CEFTRIAXONE  <=0.25 SENSITIVE Sensitive     CIPROFLOXACIN <=0.25 SENSITIVE Sensitive     GENTAMICIN <=1 SENSITIVE Sensitive     IMIPENEM 0.5 SENSITIVE Sensitive     TRIMETH/SULFA <=20  SENSITIVE Sensitive     AMPICILLIN /SULBACTAM 4 SENSITIVE Sensitive     PIP/TAZO 8 SENSITIVE Sensitive ug/mL    * RAOULTELLA PLANTICOLA  Culture, blood (Routine X 2) w Reflex to ID Panel     Status: None (Preliminary result)   Collection Time: 06/16/23  2:16 PM   Specimen: BLOOD RIGHT ARM  Result Value Ref Range Status   Specimen Description   Final    BLOOD RIGHT ARM Performed at [facility], [address] [place], [place] [address]    Special Requests   Final    BOTTLES DRAWN AEROBIC AND ANAEROBIC Blood Culture results may not be optimal due to an inadequate volume of blood received in culture bottles Performed at [facility], [address], [place], [place] [address]    Culture   Final  NO GROWTH < 24 HOURS Performed at [facility], [address], [place], [place] [address]    Report Status PENDING  Incomplete  Culture, blood (Routine X 2) w Reflex to ID Panel     Status: None (Preliminary result)   Collection Time: 06/16/23  2:21 PM   Specimen: BLOOD RIGHT ARM  Result Value Ref Range Status   Specimen Description   Final    BLOOD RIGHT ARM Performed at [facility], [address] [place], [place] [address]    Special Requests   Final    BOTTLES DRAWN AEROBIC AND ANAEROBIC Blood Culture results may not be optimal due to an inadequate volume of blood received in culture bottles Performed at [facility], [address], [place], [place] [address]    Culture   Final    NO GROWTH < 24 HOURS Performed at [facility], [address], [place], [place] [address]    Report Status PENDING  Incomplete    Studies/Results: No results found.    Assessment/Plan:  INTERVAL HISTORY: repeat blood cultures NG so far at <24 hours   Principal Problem:   Bacteremia due to Klebsiella pneumoniae Active Problems:   Hepatic cirrhosis due to chronic hepatitis C infection (HCC)   Hypertension associated with  diabetes (HCC)   Type 2 diabetes mellitus (HCC)   Secondary esophageal varices without bleeding (HCC)   Hepatocellular carcinoma (HCC)   Thrombocytopenia (HCC)   GAVE (gastric antral vascular ectasia)   SIRS (systemic inflammatory response syndrome) (HCC)   Cancer associated pain   Hyponatremia   Port or reservoir infection   History of hepatocellular carcinoma   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 male with current Klebsiella  due to port that now has been removed   #1 Port infection due to Klebsiella  Our plan was to DC him with augmentin  to complete 2 weeks of post port removal antibiotics However as mentioned below we now have seen S Maltophila on culture albeit like Staph Haemolyticus in only 1/2 cultures  #2 S. Maltophila in 1/2 blood cultures. While not a common skin organism like the Coag neg staph it can be on the skin and is a fairly low virulence organism  I have felt there is a good chance of it also being a contaminant as is the case for Staph H  To try to shed clarity I ordered repeat blood cultures since if the S maltophila is truly a pathogen it should grow since we are NOT targetting it at present  The organism on culture is S to both levaquin and bactrim. Typically with true blood sttream infections we have given 2 active agents  For now I would prefer to not target it and followup the repeat blood cultures  I have personally spent 50 minutes involved in face-to-face and non-face-to-face activities for this patient on the day of the visit. Professional time spent includes the following activities: Preparing to see the patient (review of tests), Obtaining and/or reviewing separately obtained history (admission/discharge record), Performing a medically appropriate examination and/or evaluation , Ordering medications/tests/procedures, referring and communicating with other health care professionals, Documenting clinical  information in the EMR, Independently interpreting results (not separately reported), Communicating results to the patient/family/caregiver, Counseling and educating the patient/family/caregiver and Care coordination (not separately reported).   Evaluation of the patient requires complex antimicrobial therapy evaluation, counseling , isolation needs to reduce disease transmission and  risk assessment and mitigation.     LOS: 6 days   Jomarie Fleeta Rothman 06/17/2023, 2:08 PM

## 2023-06-17 NOTE — Progress Notes (Signed)
 [facility] PROGRESS NOTE  Troy [name] (DOB: Jan 11, 1956) FMW:[id number] PCP: [name], MD  Brief Narrative: Troy [name] is a 68 y.o. 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 He had low grade fever, tachycardia, leukocytosis (WBC 12.9k), lactic acid elevation (2.8 x2). CT abd/pelvis showed progression of disease with increase in the size of liver mass, progression of nodal metastasis. Increase in the size of the metastatic implants in the inferior right rectus sheath and right pelvic sidewall. 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   He was admitted, started on IV antibiotics after blood cultures were drawn and ultimately found to have Klebsiella ornithinolytica bacteremia with identical resistance pattern to prior infection. Port was removed 1/31. ID consulted. Plan is to repeat blood cultures 2/4 and transition to oral antibiotics once these have resulted.   Subjective: Lower abdominal pain is not well controlled. He's tolerating robaxin . Spouse at bedside.  Objective: BP (!) 143/90 (BP Location: Right Arm)   Pulse 92   Temp 99.6 F (37.6 C) (Oral)   Resp 16   Ht 5' 10 (1.778 m)   Wt 63.5 kg   SpO2 98%   BMI 20.09 kg/m   Gen: Pleasant male in evident discomfort complaining of spasming/cramping lower abdominal pain. He's eating ok and having BMs.  Pulm: Nonlabored  CV: RRR, no edema GI: Soft, modestly tender but nondistended, no rebound or guarding Neuro: Alert and oriented. No new focal deficits. Ext: Warm, no deformities. Skin: No new rashes, lesions or ulcers on visualized skin   Assessment & Plan: Klebsiella  ornithinolytica bacteremia: Has history of Klebsiella ornithinolytica sepsis in Dec 2024 and current culture data, this is suspected to be recurrent/refractory infection due to port (4 out of 4 sets). Improved after cefepime  then augmentin  therapy previously but remained febrile despite taking this PTA. - GPC in 1 of 4 also noted, updated ID to stenotrophomonas. Catheter tip grew Raoultella planticola. Both are most likely contaminant/nonpathogenic in this case. To prove clearance, per ID recommendations, blood cultures repeated 06/16/2023, remains NGTD < 24hours. He has slight uptick in temperature today to 99.20F, though WBC stable. Will continue monitoring culture, d/w ID.   - We will continue unasyn  and follow rec's for po transition. - s/p port removal 1/31    Hyponatremia: Likely related to cirrhosis, possibly also SIADH, though this is improving and mild.  - Will aim to maximize po intake by not restricting diet at this time.   Stage IV hepatocellular carcinoma: Most recent CT shows progression of liver and metastatic adenopathy lesions.  - Dr. Lanny has evaluated, suggests follow up near conclusion of antibiotic regimen with oncology to discuss sorafinib. Currently has appt 2/24 - LLQ pain consistent with psoas invasion by adenopathy. Will treat with scheduled robaxin  and prn norco. Increase robaxin  today. Plan to continue management with outpatient palliative care.   Cirrhosis with esophageal varices:  - Would ideally be on nonselective beta blocker, though no longer taking coreg .     IDT2DM: Recent HbA1c 8.4%, uncontrolled hyperglycemia.  - Continue levemir (increase back to home dose 15u) and SSI - Hold metformin and jardiance  for now.      Iron  deficiency due to chronic blood loss anemia  due to GAVE: No gross bleeding currently.  - Holding anticoagulation due to severely elevated bleeding risk.  - Pt is on PPI - Monitor H/H intermittently. Stable thus far.  Chronic portal vein  thrombosis: CT showed occlusion of the main portal vein with cavernous transformation and upper abdominal varices.   - Eliquis  was stopped per GI recommendation due to high risk of recurrent GI bleeding.   Chronic thrombocytopenia: Slightly lower than previous baseline, due to sepsis. No bleeding.  - Continue monitoring.   Constipation: Worsened by opioids. Exam reassuring - Augment to senokot scheduled and add miralax  daily.   HTN: Normotensive off medications.   Hypokalemia:  - Supplement  Lactic acid elevation: Due to sepsis and reduced hepatic clearance.   Bernardino KATHEE Come, MD [facility] [email or web address] 06/17/2023, 9:05 AM

## 2023-06-18 ENCOUNTER — Other Ambulatory Visit: Payer: Self-pay

## 2023-06-18 DIAGNOSIS — R6510 Systemic inflammatory response syndrome (SIRS) of non-infectious origin without acute organ dysfunction: Secondary | ICD-10-CM | POA: Diagnosis not present

## 2023-06-18 DIAGNOSIS — B961 Klebsiella pneumoniae [K. pneumoniae] as the cause of diseases classified elsewhere: Secondary | ICD-10-CM | POA: Diagnosis not present

## 2023-06-18 DIAGNOSIS — R509 Fever, unspecified: Secondary | ICD-10-CM | POA: Diagnosis not present

## 2023-06-18 DIAGNOSIS — G893 Neoplasm related pain (acute) (chronic): Secondary | ICD-10-CM | POA: Diagnosis not present

## 2023-06-18 DIAGNOSIS — R7881 Bacteremia: Secondary | ICD-10-CM | POA: Diagnosis not present

## 2023-06-18 DIAGNOSIS — B377 Candidal sepsis: Secondary | ICD-10-CM

## 2023-06-18 LAB — BLOOD CULTURE ID PANEL (REFLEXED) - BCID2

## 2023-06-18 LAB — GLUCOSE, CAPILLARY
Glucose-Capillary: 207 mg/dL — ABNORMAL HIGH (ref 70–99)
Glucose-Capillary: 141 mg/dL — ABNORMAL HIGH (ref 70–99)
Glucose-Capillary: 145 mg/dL — ABNORMAL HIGH (ref 70–99)
Glucose-Capillary: 126 mg/dL — ABNORMAL HIGH (ref 70–99)
Glucose-Capillary: 108 mg/dL — ABNORMAL HIGH (ref 70–99)

## 2023-06-18 MED ORDER — MICAFUNGIN SODIUM 100 MG IV SOLR
100.0000 mg | INTRAVENOUS | Status: DC
  Filled 2023-06-18 (×2): qty 5

## 2023-06-18 MED ORDER — SODIUM CHLORIDE 0.9 % IV SOLN
150.0000 mg | INTRAVENOUS | Status: DC
  Administered 2023-06-18: 150 mg via INTRAVENOUS
  Filled 2023-06-18: qty 7.5

## 2023-06-18 NOTE — Progress Notes (Signed)
 PHARMACY - PHYSICIAN COMMUNICATION CRITICAL VALUE ALERT - BLOOD CULTURE IDENTIFICATION (BCID)  Troy [name] is an 68 y.o. male who presented to [facility] on 06/11/2023 with a chief complaint of fever, chills, poor oral intake   Assessment:  budding yeast, no ID 1/4  Current antibiotics: unasyn   Changes to prescribed antibiotics recommended:  Micafungin  150mg  IV q24h per Dr Fleeta Rothman  Results for orders placed or performed during the hospital encounter of 06/11/23  Blood Culture ID Panel (Reflexed) (Collected: 06/16/2023  2:16 PM)  Result Value Ref Range   Enterococcus faecalis NOT DETECTED NOT DETECTED   Enterococcus Faecium NOT DETECTED NOT DETECTED   Listeria monocytogenes NOT DETECTED NOT DETECTED   Staphylococcus species NOT DETECTED NOT DETECTED   Staphylococcus aureus (BCID) NOT DETECTED NOT DETECTED   Staphylococcus epidermidis NOT DETECTED NOT DETECTED   Staphylococcus lugdunensis NOT DETECTED NOT DETECTED   Streptococcus species NOT DETECTED NOT DETECTED   Streptococcus agalactiae NOT DETECTED NOT DETECTED   Streptococcus pneumoniae NOT DETECTED NOT DETECTED   Streptococcus pyogenes NOT DETECTED NOT DETECTED   A.calcoaceticus-baumannii NOT DETECTED NOT DETECTED   Bacteroides fragilis NOT DETECTED NOT DETECTED   Enterobacterales NOT DETECTED NOT DETECTED   Enterobacter cloacae complex NOT DETECTED NOT DETECTED   Escherichia coli NOT DETECTED NOT DETECTED   Klebsiella aerogenes NOT DETECTED NOT DETECTED   Klebsiella oxytoca NOT DETECTED NOT DETECTED   Klebsiella pneumoniae NOT DETECTED NOT DETECTED   Proteus species NOT DETECTED NOT DETECTED   Salmonella species NOT DETECTED NOT DETECTED   Serratia marcescens NOT DETECTED NOT DETECTED   Haemophilus influenzae NOT DETECTED NOT DETECTED   Neisseria meningitidis NOT DETECTED NOT DETECTED   Pseudomonas aeruginosa NOT DETECTED NOT DETECTED   Stenotrophomonas maltophilia NOT DETECTED NOT DETECTED   Candida albicans NOT  DETECTED NOT DETECTED   Candida auris NOT DETECTED NOT DETECTED   Candida glabrata NOT DETECTED NOT DETECTED   Candida krusei NOT DETECTED NOT DETECTED   Candida parapsilosis NOT DETECTED NOT DETECTED   Candida tropicalis NOT DETECTED NOT DETECTED   Cryptococcus neoformans/gattii NOT DETECTED NOT DETECTED    Leeroy Mace RPh 06/18/2023, 3:34 AM
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
## 2023-06-18 NOTE — Plan of Care (Addendum)
 Plan of care is reviewed. Pt has been progressing. He is alert and fully oriented x 4, able to ambulate independently in his room, stable hemodynamically, afebrile, normal respiratory effort, no acute distress overnight. Pain is well controlled. He is able to rest well tonight.   Problem: Coping: Goal: Ability to adjust to condition or change in health will improve Outcome: Progressing   Problem: Fluid Volume: Goal: Ability to maintain a balanced intake and output will improve Outcome: Progressing   Problem: Health Behavior/Discharge Planning: Goal: Ability to identify and utilize available resources and services will improve Outcome: Progressing Goal: Ability to manage health-related needs will improve Outcome: Progressing   Problem: Metabolic: Goal: Ability to maintain appropriate glucose levels will improve Outcome: Progressing   Problem: Nutritional: Goal: Maintenance of adequate nutrition will improve Outcome: Progressing Goal: Progress toward achieving an optimal weight will improve Outcome: Progressing   Problem: Clinical Measurements: Goal: Diagnostic test results will improve Outcome: Progressing Goal: Signs and symptoms of infection will decrease Outcome: Progressing   Problem: Respiratory: Goal: Ability to maintain adequate ventilation will improve Outcome: Progressing  Problem: Clinical Measurements: Goal: Ability to maintain clinical measurements within normal limits will improve Outcome: Progressing Goal: Will remain free from infection Outcome: Progressing Goal: Diagnostic test results will improve Outcome: Progressing Goal: Respiratory complications will improve Outcome: Progressing Goal: Cardiovascular complication will be avoided Outcome: Progressing  Problem: Activity: Goal: Risk for activity intolerance will decrease Outcome: Progressing  Problem: Pain Managment: Goal: General experience of comfort will improve and/or be controlled Outcome:  Progressing  Wendi Dash, RN

## 2023-06-18 NOTE — Progress Notes (Signed)
 Subjective:  No new complaints  Antibiotics:  Anti-infectives (From admission, onward)    Start     Dose/Rate Route Frequency Ordered Stop   06/19/23 0415  micafungin  (MYCAMINE ) 100 mg in sodium chloride  0.9 % 100 mL IVPB        100 mg 105 mL/hr over 1 Hours Intravenous Every 24 hours 06/18/23 0828     06/18/23 0415  micafungin  (MYCAMINE ) 150 mg in sodium chloride  0.9 % 100 mL IVPB  Status:  Discontinued        150 mg 107.5 mL/hr over 1 Hours Intravenous Every 24 hours 06/18/23 0328 06/18/23 0828   06/15/23 1000  Ampicillin -Sulbactam (UNASYN ) 3 g in sodium chloride  0.9 % 100 mL IVPB        3 g 200 mL/hr over 30 Minutes Intravenous Every 6 hours 06/15/23 0851     06/14/23 2200  vancomycin  (VANCOREADY) IVPB 1500 mg/300 mL  Status:  Discontinued        1,500 mg 150 mL/hr over 120 Minutes Intravenous Every 24 hours 06/14/23 1405 06/15/23 0851   06/13/23 1700  ceFEPIme  (MAXIPIME ) 2 g in sodium chloride  0.9 % 100 mL IVPB  Status:  Discontinued        2 g 200 mL/hr over 30 Minutes Intravenous Every 8 hours 06/13/23 1425 06/15/23 0851   06/12/23 2200  vancomycin  (VANCOREADY) IVPB 1250 mg/250 mL  Status:  Discontinued        1,250 mg 166.7 mL/hr over 90 Minutes Intravenous Every 24 hours 06/12/23 1205 06/14/23 1405   06/12/23 1800  vancomycin  (VANCOCIN ) IVPB 1000 mg/200 mL premix  Status:  Discontinued        1,000 mg 200 mL/hr over 60 Minutes Intravenous Every 24 hours 06/11/23 2232 06/12/23 0946   06/12/23 0900  ceFEPIme  (MAXIPIME ) 2 g in sodium chloride  0.9 % 100 mL IVPB  Status:  Discontinued        2 g 200 mL/hr over 30 Minutes Intravenous Every 12 hours 06/11/23 2232 06/13/23 1425   06/11/23 2200  metroNIDAZOLE  (FLAGYL ) IVPB 500 mg  Status:  Discontinued        500 mg 100 mL/hr over 60 Minutes Intravenous Every 12 hours 06/11/23 2145 06/12/23 0946   06/11/23 2130  vancomycin  (VANCOREADY) IVPB 1250 mg/250 mL        1,250 mg 166.7 mL/hr over 90 Minutes Intravenous  Once  06/11/23 2046 06/11/23 2316   06/11/23 2100  ceFEPIme  (MAXIPIME ) 2 g in sodium chloride  0.9 % 100 mL IVPB        2 g 200 mL/hr over 30 Minutes Intravenous  Once 06/11/23 2046 06/11/23 2135       Medications: Scheduled Meds:  dicyclomine   10 mg Oral TID AC & HS   insulin  aspart  0-5 Units Subcutaneous QHS   insulin  aspart  0-9 Units Subcutaneous TID WC   insulin  glargine-yfgn  15 Units Subcutaneous Daily   methocarbamol   1,000 mg Oral TID   pantoprazole   40 mg Oral BID AC   polyethylene glycol  17 g Oral Daily   senna-docusate  2 tablet Oral BID   Continuous Infusions:  ampicillin -sulbactam (UNASYN ) IV 3 g (06/18/23 1137)   [START ON 06/19/2023] micafungin  (MYCAMINE ) 100 mg in sodium chloride  0.9 % 100 mL IVPB     PRN Meds:.acetaminophen  **OR** acetaminophen , feeding supplement, HYDROcodone -acetaminophen , ondansetron  **OR** ondansetron  (ZOFRAN ) IV, mouth rinse, polyvinyl alcohol     Objective: Weight change:   Intake/Output Summary (Last 24 hours)  at 06/18/2023 1512 Last data filed at 06/18/2023 1230 Gross per 24 hour  Intake 1378 ml  Output --  Net 1378 ml    Blood pressure 134/73, pulse 92, temperature 99 F (37.2 C), resp. rate 16, height 5' 10 (1.778 m), weight 63.5 kg, SpO2 99%. Temp:  [97.7 F (36.5 C)-99 F (37.2 C)] 99 F (37.2 C) (02/06 1316) Pulse Rate:  [91-92] 92 (02/06 1316) Resp:  [16-18] 16 (02/06 1316) BP: (123-134)/(69-73) 134/73 (02/06 1316) SpO2:  [99 %-100 %] 99 % (02/06 1316)  Physical Exam: Physical Exam Constitutional:      Appearance: He is well-developed.  HENT:     Head: Normocephalic and atraumatic.  Eyes:     Conjunctiva/sclera: Conjunctivae normal.  Cardiovascular:     Rate and Rhythm: Normal rate and regular rhythm.  Pulmonary:     Effort: Pulmonary effort is normal. No respiratory distress.     Breath sounds: No wheezing.  Abdominal:     Palpations: Abdomen is soft.  Musculoskeletal:        General: Normal range of motion.      Cervical back: Normal range of motion and neck supple.  Skin:    General: Skin is warm and dry.     Findings: No erythema or rash.  Neurological:     General: No focal deficit present.     Mental Status: He is alert and oriented to person, place, and time.  Psychiatric:        Mood and Affect: Mood normal.        Behavior: Behavior normal.        Thought Content: Thought content normal.        Judgment: Judgment normal.      CBC:    BMET Recent Labs    06/16/23 0438 06/17/23 0437  NA 135 136  K 3.3* 3.6  CL 102 105  CO2 23 23  GLUCOSE 172* 194*  BUN 11 9  CREATININE 0.58* 0.72  CALCIUM 8.5* 8.2*     Liver Panel  No results for input(s): PROT, ALBUMIN , AST, ALT, ALKPHOS, BILITOT, BILIDIR, IBILI in the last 72 hours.     Sedimentation Rate No results for input(s): ESRSEDRATE in the last 72 hours. C-Reactive Protein No results for input(s): CRP in the last 72 hours.  Micro Results: Recent Results (from the past 720 hours)  Resp panel by RT-PCR (RSV, Flu A&B, Covid) Anterior Nasal Swab     Status: None   Collection Time: 06/11/23  4:52 PM   Specimen: Anterior Nasal Swab  Result Value Ref Range Status   SARS Coronavirus 2 by RT PCR NEGATIVE NEGATIVE Final    Comment: (NOTE) SARS-CoV-2 target nucleic acids are NOT DETECTED.  The SARS-CoV-2 RNA is generally detectable in upper respiratory specimens during the acute phase of infection. The lowest concentration of SARS-CoV-2 viral copies this assay can detect is 138 copies/mL. A negative result does not preclude SARS-Cov-2 infection and should not be used as the sole basis for treatment or other patient management decisions. A negative result may occur with  improper specimen collection/handling, submission of specimen other than nasopharyngeal swab, presence of viral mutation(s) within the areas targeted by this assay, and inadequate number of viral copies(<138 copies/mL). A negative  result must be combined with clinical observations, patient history, and epidemiological information. The expected result is Negative.  Fact Sheet for Patients:  bloggercourse.com  Fact Sheet for Healthcare Providers:  seriousbroker.it  This test is no t yet approved  or cleared by the [place]  FDA and  has been authorized for detection and/or diagnosis of SARS-CoV-2 by FDA under an Emergency Use Authorization (EUA). This EUA will remain  in effect (meaning this test can be used) for the duration of the COVID-19 declaration under Section 564(b)(1) of the Act, 21 U.S.C.section 360bbb-3(b)(1), unless the authorization is terminated  or revoked sooner.       Influenza A by PCR NEGATIVE NEGATIVE Final   Influenza B by PCR NEGATIVE NEGATIVE Final    Comment: (NOTE) The Xpert Xpress SARS-CoV-2/FLU/RSV plus assay is intended as an aid in the diagnosis of influenza from Nasopharyngeal swab specimens and should not be used as a sole basis for treatment. Nasal washings and aspirates are unacceptable for Xpert Xpress SARS-CoV-2/FLU/RSV testing.  Fact Sheet for Patients: bloggercourse.com  Fact Sheet for Healthcare Providers: seriousbroker.it  This test is not yet approved or cleared by the [place]  FDA and has been authorized for detection and/or diagnosis of SARS-CoV-2 by FDA under an Emergency Use Authorization (EUA). This EUA will remain in effect (meaning this test can be used) for the duration of the COVID-19 declaration under Section 564(b)(1) of the Act, 21 U.S.C. section 360bbb-3(b)(1), unless the authorization is terminated or revoked.     Resp Syncytial Virus by PCR NEGATIVE NEGATIVE Final    Comment: (NOTE) Fact Sheet for Patients: bloggercourse.com  Fact Sheet for Healthcare Providers: seriousbroker.it  This  test is not yet approved or cleared by the [place]  FDA and has been authorized for detection and/or diagnosis of SARS-CoV-2 by FDA under an Emergency Use Authorization (EUA). This EUA will remain in effect (meaning this test can be used) for the duration of the COVID-19 declaration under Section 564(b)(1) of the Act, 21 U.S.C. section 360bbb-3(b)(1), unless the authorization is terminated or revoked.  Performed at [facility], [address] [place], [place] [address]   Blood Culture (routine x 2)     Status: Abnormal   Collection Time: 06/11/23  5:08 PM   Specimen: BLOOD  Result Value Ref Range Status   Specimen Description   Final    BLOOD RIGHT ANTECUBITAL Performed at [facility], [address] [place], [place] [address]    Special Requests   Final    BOTTLES DRAWN AEROBIC AND ANAEROBIC Blood Culture adequate volume Performed at [facility], [address] [place], [place] [address]    Culture  Setup Time   Final    GRAM NEGATIVE RODS IN BOTH AEROBIC AND ANAEROBIC BOTTLES CRITICAL RESULT CALLED TO, READ BACK BY AND VERIFIED WITH: PHARMD A.ARLINGTON AT [id number] ON 06/12/2023 BY T.SAAD. Performed at [facility], [address], [place], [place] [address]    Culture KLEBSIELLA ORNITHINOLYTICA (A)  Final   Report Status 06/14/2023 FINAL  Final   Organism ID, Bacteria KLEBSIELLA ORNITHINOLYTICA  Final      Susceptibility   Klebsiella ornithinolytica - MIC*    AMPICILLIN  RESISTANT Resistant     CEFEPIME  <=0.12 SENSITIVE Sensitive     CEFTAZIDIME <=1 SENSITIVE Sensitive     CEFTRIAXONE  <=0.25 SENSITIVE Sensitive     CIPROFLOXACIN <=0.25 SENSITIVE Sensitive     GENTAMICIN <=1 SENSITIVE Sensitive     IMIPENEM <=0.25 SENSITIVE Sensitive     TRIMETH/SULFA <=20 SENSITIVE Sensitive     AMPICILLIN /SULBACTAM <=2 SENSITIVE Sensitive     PIP/TAZO <=4 SENSITIVE Sensitive ug/mL    * KLEBSIELLA ORNITHINOLYTICA   Blood Culture (routine x 2)  Status: Abnormal   Collection Time: 06/11/23  5:08 PM   Specimen: BLOOD  Result Value Ref Range Status   Specimen Description   Final    BLOOD LEFT ANTECUBITAL Performed at [facility], [address], [place], [place] [address]    Special Requests   Final    BOTTLES DRAWN AEROBIC AND ANAEROBIC Blood Culture adequate volume Performed at [facility], [address], [place], [place] [address]    Culture  Setup Time   Final    GRAM NEGATIVE RODS IN BOTH AEROBIC AND ANAEROBIC BOTTLES CRITICAL VALUE NOTED.  VALUE IS CONSISTENT WITH PREVIOUSLY REPORTED AND CALLED VALUE. GRAM POSITIVE COCCI AEROBIC BOTTLE ONLY CRITICAL RESULT CALLED TO, READ BACK BY AND VERIFIED WITH: PHARMD A ALLINGTON [id number] AT 1125 BY CM    Culture (A)  Final    KLEBSIELLA ORNITHINOLYTICA SUSCEPTIBILITIES PERFORMED ON PREVIOUS CULTURE WITHIN THE LAST 5 DAYS. STAPHYLOCOCCUS HAEMOLYTICUS THE SIGNIFICANCE OF ISOLATING THIS ORGANISM FROM A SINGLE SET OF BLOOD CULTURES WHEN MULTIPLE SETS ARE DRAWN IS UNCERTAIN. PLEASE NOTIFY THE MICROBIOLOGY DEPARTMENT WITHIN ONE WEEK IF SPECIATION AND SENSITIVITIES ARE REQUIRED. Performed at [facility], [address], [place], [place] [address]    Report Status 06/15/2023 FINAL  Final  Blood Culture ID Panel (Reflexed)     Status: Abnormal   Collection Time: 06/11/23  5:08 PM  Result Value Ref Range Status   Enterococcus faecalis NOT DETECTED NOT DETECTED Final   Enterococcus Faecium NOT DETECTED NOT DETECTED Final   Listeria monocytogenes NOT DETECTED NOT DETECTED Final   Staphylococcus species NOT DETECTED NOT DETECTED Final   Staphylococcus aureus (BCID) NOT DETECTED NOT DETECTED Final   Staphylococcus epidermidis NOT DETECTED NOT DETECTED Final   Staphylococcus lugdunensis NOT DETECTED NOT DETECTED Final   Streptococcus species NOT DETECTED NOT DETECTED Final   Streptococcus agalactiae NOT DETECTED  NOT DETECTED Final   Streptococcus pneumoniae NOT DETECTED NOT DETECTED Final   Streptococcus pyogenes NOT DETECTED NOT DETECTED Final   A.calcoaceticus-baumannii NOT DETECTED NOT DETECTED Final   Bacteroides fragilis NOT DETECTED NOT DETECTED Final   Enterobacterales DETECTED (A) NOT DETECTED Final    Comment: Enterobacterales represent a large order of gram negative bacteria, not a single organism. Refer to culture for further identification. CRITICAL RESULT CALLED TO, READ BACK BY AND VERIFIED WITH: PHARMD A.ARLINGTON AT 9073 ON 06/12/2023 BY T.SAAD.    Enterobacter cloacae complex NOT DETECTED NOT DETECTED Final   Escherichia coli NOT DETECTED NOT DETECTED Final   Klebsiella aerogenes NOT DETECTED NOT DETECTED Final   Klebsiella oxytoca NOT DETECTED NOT DETECTED Final   Klebsiella pneumoniae NOT DETECTED NOT DETECTED Final   Proteus species NOT DETECTED NOT DETECTED Final   Salmonella species NOT DETECTED NOT DETECTED Final   Serratia marcescens NOT DETECTED NOT DETECTED Final   Haemophilus influenzae NOT DETECTED NOT DETECTED Final   Neisseria meningitidis NOT DETECTED NOT DETECTED Final   Pseudomonas aeruginosa NOT DETECTED NOT DETECTED Final   Stenotrophomonas maltophilia NOT DETECTED NOT DETECTED Final   Candida albicans NOT DETECTED NOT DETECTED Final   Candida auris NOT DETECTED NOT DETECTED Final   Candida glabrata NOT DETECTED NOT DETECTED Final   Candida krusei NOT DETECTED NOT DETECTED Final   Candida parapsilosis NOT DETECTED NOT DETECTED Final   Candida tropicalis NOT DETECTED NOT DETECTED Final   Cryptococcus neoformans/gattii NOT DETECTED NOT DETECTED Final   CTX-M ESBL NOT DETECTED NOT DETECTED Final   Carbapenem resistance IMP NOT DETECTED  NOT DETECTED Final   Carbapenem resistance KPC NOT DETECTED NOT DETECTED Final   Carbapenem resistance NDM NOT DETECTED NOT DETECTED Final   Carbapenem resist OXA 48 LIKE NOT DETECTED NOT DETECTED Final   Carbapenem resistance  VIM NOT DETECTED NOT DETECTED Final    Comment: Performed at [facility], [address] [place], [place] [address]  Blood Culture ID Panel (Reflexed)     Status: Abnormal   Collection Time: 06/11/23  5:08 PM  Result Value Ref Range Status   Enterococcus faecalis NOT DETECTED NOT DETECTED Final   Enterococcus Faecium NOT DETECTED NOT DETECTED Final   Listeria monocytogenes NOT DETECTED NOT DETECTED Final   Staphylococcus species DETECTED (A) NOT DETECTED Final    Comment: CRITICAL RESULT CALLED TO, READ BACK BY AND VERIFIED WITH: PHARMD A ALLINGTON [id number] AT 1125 BY CM    Staphylococcus aureus (BCID) NOT DETECTED NOT DETECTED Final   Staphylococcus epidermidis NOT DETECTED NOT DETECTED Final   Staphylococcus lugdunensis NOT DETECTED NOT DETECTED Final   Streptococcus species NOT DETECTED NOT DETECTED Final   Streptococcus agalactiae NOT DETECTED NOT DETECTED Final   Streptococcus pneumoniae NOT DETECTED NOT DETECTED Final   Streptococcus pyogenes NOT DETECTED NOT DETECTED Final   A.calcoaceticus-baumannii NOT DETECTED NOT DETECTED Final   Bacteroides fragilis NOT DETECTED NOT DETECTED Final   Enterobacterales DETECTED (A) NOT DETECTED Final    Comment: Enterobacterales represent a large order of gram negative bacteria, not a single organism. Refer to culture for further identification. CRITICAL RESULT CALLED TO, READ BACK BY AND VERIFIED WITH: PHARMD A ALLINGTON [id number] AT 1125 BY CM    Enterobacter cloacae complex NOT DETECTED NOT DETECTED Final   Escherichia coli NOT DETECTED NOT DETECTED Final   Klebsiella aerogenes NOT DETECTED NOT DETECTED Final   Klebsiella oxytoca NOT DETECTED NOT DETECTED Final   Klebsiella pneumoniae NOT DETECTED NOT DETECTED Final   Proteus species NOT DETECTED NOT DETECTED Final   Salmonella species NOT DETECTED NOT DETECTED Final   Serratia marcescens NOT DETECTED NOT DETECTED Final   Haemophilus influenzae NOT DETECTED NOT DETECTED Final    Neisseria meningitidis NOT DETECTED NOT DETECTED Final   Pseudomonas aeruginosa NOT DETECTED NOT DETECTED Final   Stenotrophomonas maltophilia NOT DETECTED NOT DETECTED Final   Candida albicans NOT DETECTED NOT DETECTED Final   Candida auris NOT DETECTED NOT DETECTED Final   Candida glabrata NOT DETECTED NOT DETECTED Final   Candida krusei NOT DETECTED NOT DETECTED Final   Candida parapsilosis NOT DETECTED NOT DETECTED Final   Candida tropicalis NOT DETECTED NOT DETECTED Final   Cryptococcus neoformans/gattii NOT DETECTED NOT DETECTED Final   CTX-M ESBL NOT DETECTED NOT DETECTED Final   Carbapenem resistance IMP NOT DETECTED NOT DETECTED Final   Carbapenem resistance KPC NOT DETECTED NOT DETECTED Final   Carbapenem resistance NDM NOT DETECTED NOT DETECTED Final   Carbapenem resist OXA 48 LIKE NOT DETECTED NOT DETECTED Final   Carbapenem resistance VIM NOT DETECTED NOT DETECTED Final    Comment: Performed at [facility], [address], [place], [place] [address]  Blood culture (routine x 2)     Status: Abnormal   Collection Time: 06/11/23  8:45 PM   Specimen: BLOOD RIGHT HAND  Result Value Ref Range Status   Specimen Description   Final    BLOOD RIGHT HAND Performed at [facility], [address], [place], [place] [address]    Special Requests   Final    BOTTLES  DRAWN AEROBIC AND ANAEROBIC Blood Culture results may not be optimal due to an inadequate volume of blood received in culture bottles Performed at [facility], [address], [place], [place] [address]    Culture  Setup Time   Final    GRAM NEGATIVE RODS ANAEROBIC BOTTLE ONLY CRITICAL VALUE NOTED.  VALUE IS CONSISTENT WITH PREVIOUSLY REPORTED AND CALLED VALUE.    Culture (A)  Final    KLEBSIELLA ORNITHINOLYTICA SUSCEPTIBILITIES PERFORMED ON PREVIOUS CULTURE WITHIN THE LAST 5 DAYS. STENOTROPHOMONAS MALTOPHILIA CRITICAL RESULT CALLED TO, READ BACK BY AND VERIFIED WITH: PHARMD CHRISTELLA MILLET  [id number] AT 920 BY CM Performed at [facility], [address] [place], [place] [address]    Report Status 06/17/2023 FINAL  Final   Organism ID, Bacteria STENOTROPHOMONAS MALTOPHILIA  Final      Susceptibility   Stenotrophomonas maltophilia - MIC*    LEVOFLOXACIN 0.25 SENSITIVE Sensitive     TRIMETH/SULFA <=20 SENSITIVE Sensitive     * STENOTROPHOMONAS MALTOPHILIA  Blood culture (routine x 2)     Status: Abnormal   Collection Time: 06/11/23  9:00 PM   Specimen: BLOOD  Result Value Ref Range Status   Specimen Description   Final    BLOOD LEFT ANTECUBITAL Performed at [facility], [address] [place], [place] [address]    Special Requests   Final    BOTTLES DRAWN AEROBIC AND ANAEROBIC Blood Culture results may not be optimal due to an inadequate volume of blood received in culture bottles Performed at [facility], [address] [place], [place] [address]    Culture  Setup Time   Final    GRAM NEGATIVE RODS AEROBIC BOTTLE ONLY CRITICAL VALUE NOTED.  VALUE IS CONSISTENT WITH PREVIOUSLY REPORTED AND CALLED VALUE.    Culture (A)  Final    KLEBSIELLA ORNITHINOLYTICA SUSCEPTIBILITIES PERFORMED ON PREVIOUS CULTURE WITHIN THE LAST 5 DAYS. Performed at [facility], [address] [place], [place] [address]    Report Status 06/14/2023 FINAL  Final  Cath Tip Culture     Status: Abnormal   Collection Time: 06/12/23  5:14 PM   Specimen: Catheter Tip  Result Value Ref Range Status   Specimen Description   Final    CATH TIP Performed at [facility], [address] [place], [place] [address]    Special Requests   Final    NONE Performed at [facility], [address], [place], [place] [address]    Culture RAOULTELLA PLANTICOLA (A)  Final   Report Status 06/14/2023 FINAL  Final   Organism ID, Bacteria RAOULTELLA PLANTICOLA  Final      Susceptibility   Raoultella planticola - MIC*     AMPICILLIN  >=32 RESISTANT Resistant     CEFEPIME  <=0.12 SENSITIVE Sensitive     CEFTAZIDIME <=1 SENSITIVE Sensitive     CEFTRIAXONE  <=0.25 SENSITIVE Sensitive     CIPROFLOXACIN <=0.25 SENSITIVE Sensitive     GENTAMICIN <=1 SENSITIVE Sensitive     IMIPENEM 0.5 SENSITIVE Sensitive     TRIMETH/SULFA <=20 SENSITIVE Sensitive     AMPICILLIN /SULBACTAM 4 SENSITIVE Sensitive     PIP/TAZO 8 SENSITIVE Sensitive ug/mL    * RAOULTELLA PLANTICOLA  Culture, blood (Routine X 2) w Reflex to ID Panel     Status: None (Preliminary result)   Collection Time: 06/16/23  2:16 PM   Specimen: BLOOD RIGHT ARM  Result Value Ref Range Status   Specimen  Description   Final    BLOOD RIGHT ARM Performed at [facility], [address], [place], [place] [address]    Special Requests   Final    BOTTLES DRAWN AEROBIC AND ANAEROBIC Blood Culture results may not be optimal due to an inadequate volume of blood received in culture bottles Performed at [facility], [address], [place], [place] [address]    Culture  Setup Time   Final    BUDDING YEAST SEEN AEROBIC BOTTLE ONLY CRITICAL RESULT CALLED TO, READ BACK BY AND VERIFIED WITH: PHARMD E JACKSON 06/18/2023 @ 0255 BY AB Performed at [facility], [address] [place], [place] [address]    Culture YEAST  Final   Report Status PENDING  Incomplete  Blood Culture ID Panel (Reflexed)     Status: None   Collection Time: 06/16/23  2:16 PM  Result Value Ref Range Status   Enterococcus faecalis NOT DETECTED NOT DETECTED Final   Enterococcus Faecium NOT DETECTED NOT DETECTED Final   Listeria monocytogenes NOT DETECTED NOT DETECTED Final   Staphylococcus species NOT DETECTED NOT DETECTED Final   Staphylococcus aureus (BCID) NOT DETECTED NOT DETECTED Final   Staphylococcus epidermidis NOT DETECTED NOT DETECTED Final   Staphylococcus lugdunensis NOT DETECTED NOT DETECTED Final   Streptococcus species NOT DETECTED NOT DETECTED Final    Streptococcus agalactiae NOT DETECTED NOT DETECTED Final   Streptococcus pneumoniae NOT DETECTED NOT DETECTED Final   Streptococcus pyogenes NOT DETECTED NOT DETECTED Final   A.calcoaceticus-baumannii NOT DETECTED NOT DETECTED Final   Bacteroides fragilis NOT DETECTED NOT DETECTED Final   Enterobacterales NOT DETECTED NOT DETECTED Final   Enterobacter cloacae complex NOT DETECTED NOT DETECTED Final   Escherichia coli NOT DETECTED NOT DETECTED Final   Klebsiella aerogenes NOT DETECTED NOT DETECTED Final   Klebsiella oxytoca NOT DETECTED NOT DETECTED Final   Klebsiella pneumoniae NOT DETECTED NOT DETECTED Final   Proteus species NOT DETECTED NOT DETECTED Final   Salmonella species NOT DETECTED NOT DETECTED Final   Serratia marcescens NOT DETECTED NOT DETECTED Final   Haemophilus influenzae NOT DETECTED NOT DETECTED Final   Neisseria meningitidis NOT DETECTED NOT DETECTED Final   Pseudomonas aeruginosa NOT DETECTED NOT DETECTED Final   Stenotrophomonas maltophilia NOT DETECTED NOT DETECTED Final   Candida albicans NOT DETECTED NOT DETECTED Final   Candida auris NOT DETECTED NOT DETECTED Final   Candida glabrata NOT DETECTED NOT DETECTED Final   Candida krusei NOT DETECTED NOT DETECTED Final   Candida parapsilosis NOT DETECTED NOT DETECTED Final   Candida tropicalis NOT DETECTED NOT DETECTED Final   Cryptococcus neoformans/gattii NOT DETECTED NOT DETECTED Final    Comment: Performed at [facility], [address], [place], [place] [address]  Culture, blood (Routine X 2) w Reflex to ID Panel     Status: None (Preliminary result)   Collection Time: 06/16/23  2:21 PM   Specimen: BLOOD RIGHT ARM  Result Value Ref Range Status   Specimen Description   Final    BLOOD RIGHT ARM Performed at [facility], [address] [place], [place] [address]    Special Requests   Final    BOTTLES DRAWN AEROBIC AND ANAEROBIC Blood Culture results may not be optimal due to an inadequate volume  of blood received in culture bottles Performed at [facility], [address], [place], [place] [address]    Culture   Final    NO GROWTH  2 DAYS Performed at [facility], [address] [place], [place] [address]    Report Status PENDING  Incomplete    Studies/Results: No results found.    Assessment/Plan:  INTERVAL HISTORY: yeast seen on GS from repeat blood cultures and micafungin  started  Principal Problem:   Bacteremia due to Klebsiella pneumoniae Active Problems:   Hepatic cirrhosis due to chronic hepatitis C infection (HCC)   Hypertension associated with diabetes (HCC)   Type 2 diabetes mellitus (HCC)   Secondary esophageal varices without bleeding (HCC)   Hepatocellular carcinoma (HCC)   Thrombocytopenia (HCC)   GAVE (gastric antral vascular ectasia)   SIRS (systemic inflammatory response syndrome) (HCC)   Cancer associated pain   Hyponatremia   Port or reservoir infection   History of hepatocellular carcinoma   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 male with current Klebsiella  due to port that now has been removed   #1 Port infection due to Klebsiella  Our plan was to DC him with augmentin  to complete 2 weeks of post port removal antibiotics However as mentioned below we now have seen S Maltophila on culture albeit like Staph Haemolyticus in only 1/2 cultures and NOW when cultures repeated again has a yeast in 1/2 cultures  #2 S. Maltophila in 1/2 blood cultures. While not a common skin organism like the Coag neg staph it can be on the skin and is a fairly low virulence organism  I have felt there is a good chance of it also being a contaminant as is the case for Staph H  To try to shed clarity I ordered repeat blood cultures since if the S maltophila is truly a pathogen it should grow since we were NOT targetting it at present  While we now are not seeing S maltophila the patient  has an apparent candida in 1/2 blood cultures  #3 Candidemia: Unlike for S maltophilia we always treat even 1 culture that is positive for Candida as being consistent with true candidemia.  Therefore we started micafungin .  Hopefully this is a species that is typically sensitive to azoles.  He should also have a dedicated funduscopic exam to exclude fungal endophthalmitis  I will treat his blood cultures tomorrow  I have personally spent 54 minutes involved in face-to-face and non-face-to-face activities for this patient on the day of the visit. Professional time spent includes the following activities: Preparing to see the patient (review of tests), Obtaining and/or reviewing separately obtained history (admission/discharge record), Performing a medically appropriate examination and/or evaluation , Ordering medications/tests/procedures, referring and communicating with other health care professionals, Documenting clinical information in the EMR, Independently interpreting results (not separately reported), Communicating results to the patient/family/caregiver, Counseling and educating the patient/family/caregiver and Care coordination (not separately reported).   Evaluation of the patient requires complex antimicrobial therapy evaluation, counseling , isolation needs to reduce disease transmission and risk assessment and mitigation.     LOS: 7 days   Jomarie Fleeta Rothman 06/18/2023, 3:12 PM

## 2023-06-18 NOTE — Progress Notes (Signed)
 Sent [facility] request for services form to [facility] and to email: [email or web address] for authorization for Palliative Radiation for pain management.  Awaiting approval from the [place].
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
## 2023-06-18 NOTE — Progress Notes (Signed)
 PROGRESS NOTE    Troy [name]  FMW:[id number] DOB: 1955-09-12 DOA: 06/11/2023 PCP: [name], MD    Brief Narrative:   68 y.o. 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 He had low grade fever, tachycardia, leukocytosis (WBC 12.9k), lactic acid elevation (2.8 x2). CT abd/pelvis showed progression of disease with increase in the size of liver mass, progression of nodal metastasis. Increase in the size of the metastatic implants in the inferior right rectus sheath and right pelvic sidewall. 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    He was admitted, started on IV antibiotics after blood cultures were drawn and ultimately found to have Klebsiella ornithinolytica bacteremia with identical resistance pattern to prior infection. Port was removed 1/31. ID consulted. Plan is to repeat blood cultures 2/4 and transition to oral antibiotics once these have resulted.    Assessment & Plan:  Principal Problem:   Bacteremia due to Klebsiella pneumoniae Active Problems:   SIRS (systemic inflammatory response syndrome) (HCC)   Secondary esophageal varices without bleeding (HCC)   Hepatic cirrhosis due to chronic hepatitis C infection (HCC)   Type 2 diabetes mellitus (HCC)   Hypertension associated with diabetes (HCC)   Hepatocellular carcinoma (HCC)   Thrombocytopenia (HCC)   GAVE (gastric antral vascular ectasia)   Cancer associated pain   Hyponatremia   Port or reservoir infection   History of hepatocellular carcinoma   Infection due to Stenotrophomonas maltophilia   Fever    Klebsiella ornithinolytica bacteremia:  -Similar history of sepsis in  December 2024.  Refractory infection in 4 out of 4 side.  Catheter tip eventually grew Raoultella planticola.  Port was removed on 1/31.  To prove clearance another blood cultures on 2/flora which remains negative. -ID is recommending total 2 weeks of Augmentin  post port removal.  Currently on Unasyn   Budding yeast/fungemia - Seen on the cultures on 2/4, updated this morning.  Management per ID.  I will also consulted Dr. Maree from ophthalmology per ID request will see the patient later today for Opto exam    Hyponatremia:  Likely related to cirrhosis, possibly also SIADH, though this is improving and mild.  - Will aim to maximize po intake by not restricting diet at this time.    Stage IV hepatocellular carcinoma -Unfortunately patient is showing metastatic progression of his malignancy.  Seen by oncology team.  Planning on outpatient referral to radiation oncology.  Limited options at this time. -Pain control.   Cirrhosis with esophageal varices:  - Would ideally be on nonselective beta blocker, though no longer taking coreg .     IDT2DM:  Recent HbA1c 8.4%, uncontrolled hyperglycemia.  -Currently home p.o. medications on hold.  Long-acting insulin  with sliding scale.  Adjust as necessary.    Iron  deficiency due to chronic blood loss anemia due to GAVE Severely high risk for bleeding therefore no longer anticoagulation.  Continue PPI twice daily.   Chronic portal vein thrombosis No longer anticoagulation as patient is very high risk of bleeding.   Chronic thrombocytopenia -Continue to monitor   Constipation:  -Bowel regimen    HTN : Normotensive off medications.    Hypokalemia:  - Supplement   Lactic acid elevation:  Due to  sepsis and reduced hepatic clearance.   DVT prophylaxis: SCDs Start: 06/11/23 2144    Code Status: Full Code Family Communication:   Status is: Inpatient Remains inpatient appropriate because: Ongoing pain control and management of  sepsis/bacteremia    Subjective: Patient seen and examined at bedside.  Tells me he sees some floaters but this has been chronic for him.  Tells me cataract runs in his family therefore that is what he thinks it is from.  No other complaints   Examination:  General exam: Appears calm and comfortable  Respiratory system: Clear to auscultation. Respiratory effort normal. Cardiovascular system: S1 & S2 heard, RRR. No JVD, murmurs, rubs, gallops or clicks. No pedal edema. Gastrointestinal system: Abdomen is nondistended, soft and nontender. No organomegaly or masses felt. Normal bowel sounds heard. Central nervous system: Alert and oriented. No focal neurological deficits. Extremities: Symmetric 5 x 5 power. Skin: No rashes, lesions or ulcers Psychiatry: Judgement and insight appear normal. Mood & affect appropriate.                Diet Orders (From admission, onward)     Start     Ordered   06/12/23 1737  Diet regular Room service appropriate? Yes; Fluid consistency: Thin  Diet effective now       Comments: Advance as tolerated to pre procedural diet.  Question Answer Comment  Room service appropriate? Yes   Fluid consistency: Thin      06/12/23 1736            Objective: Vitals:   06/17/23 0704 06/17/23 1214 06/17/23 2016 06/18/23 0447  BP: (!) 143/90 127/75 127/69 123/72  Pulse: 92 92 91 92  Resp: 16 16 18 17   Temp: 99.6 F (37.6 C) (!) 97.4 F (36.3 C) 97.7 F (36.5 C) 98.1 F (36.7 C)  TempSrc: Oral Oral Oral Oral  SpO2: 98% 100% 100% 100%  Weight:      Height:        Intake/Output Summary (Last 24 hours) at 06/18/2023 1235 Last data filed at 06/18/2023 0800 Gross per 24 hour  Intake 898 ml  Output --  Net 898 ml   Filed Weights   06/12/23 0739  Weight: 63.5 kg    Scheduled Meds:  dicyclomine   10 mg Oral TID AC & HS   insulin  aspart  0-5 Units Subcutaneous QHS   insulin  aspart  0-9 Units Subcutaneous TID WC   insulin  glargine-yfgn  15  Units Subcutaneous Daily   methocarbamol   1,000 mg Oral TID   pantoprazole   40 mg Oral BID AC   polyethylene glycol  17 g Oral Daily   senna-docusate  2 tablet Oral BID   Continuous Infusions:  ampicillin -sulbactam (UNASYN ) IV 3 g (06/18/23 1137)   [START ON 06/19/2023] micafungin  (MYCAMINE ) 100 mg in sodium chloride  0.9 % 100 mL IVPB      Nutritional status     Body mass index is 20.09 kg/m.  Data Reviewed:   CBC: Recent Labs  Lab 06/11/23 1617 06/12/23 0552 06/13/23 0454 06/14/23 0439 06/15/23 0422 06/16/23 0438 06/17/23 0437  WBC 12.9*   < > 7.9 7.9 5.9 4.6 4.7  NEUTROABS 10.8*  --   --   --   --   --   --   HGB 10.2*   < > 8.4* 8.7* 8.6* 8.1* 8.4*  HCT 30.1*   < > 25.3* 26.2* 26.0* 24.9* 25.9*  MCV 91.5   < > 92.0 91.6 91.9 91.5 93.2  PLT  78*   < > 69* 92* 96* 98* 107*   < > = values in this interval not displayed.   Basic Metabolic Panel: Recent Labs  Lab 06/13/23 0454 06/14/23 0439 06/15/23 0422 06/16/23 0438 06/17/23 0437  NA 130* 131* 134* 135 136  K 4.0 3.6 3.4* 3.3* 3.6  CL 100 102 104 102 105  CO2 24 23 23 23 23   GLUCOSE 195* 169* 178* 172* 194*  BUN 20 14 12 11 9   CREATININE 1.01 0.76 0.90 0.58* 0.72  CALCIUM 8.4* 8.6* 8.6* 8.5* 8.2*   GFR: Estimated Creatinine Clearance: 78.3 mL/min (by C-G formula based on SCr of 0.72 mg/dL). Liver Function Tests: Recent Labs  Lab 06/11/23 1617 06/12/23 0552  AST 109* 76*  ALT 43 31  ALKPHOS 257* 204*  BILITOT 2.2* 1.7*  PROT 7.8 6.6  ALBUMIN  2.3* 1.9*   No results for input(s): LIPASE, AMYLASE in the last 168 hours. No results for input(s): AMMONIA in the last 168 hours. Coagulation Profile: Recent Labs  Lab 06/11/23 1617  INR 1.3*   Cardiac Enzymes: No results for input(s): CKTOTAL, CKMB, CKMBINDEX, TROPONINI in the last 168 hours. BNP (last 3 results) No results for input(s): PROBNP in the last 8760 hours. HbA1C: No results for input(s): HGBA1C in the last 72  hours. CBG: Recent Labs  Lab 06/17/23 0822 06/17/23 1145 06/17/23 1703 06/17/23 2132 06/18/23 0758  GLUCAP 181* 176* 144* 169* 207*   Lipid Profile: No results for input(s): CHOL, HDL, LDLCALC, TRIG, CHOLHDL, LDLDIRECT in the last 72 hours. Thyroid  Function Tests: No results for input(s): TSH, T4TOTAL, FREET4, T3FREE, THYROIDAB in the last 72 hours. Anemia Panel: No results for input(s): VITAMINB12, FOLATE, FERRITIN, TIBC, IRON , RETICCTPCT in the last 72 hours. Sepsis Labs: Recent Labs  Lab 06/11/23 1718 06/11/23 1931 06/12/23 0552  PROCALCITON  --   --  6.32  LATICACIDVEN 2.8* 2.8*  --     Recent Results (from the past 240 hours)  Resp panel by RT-PCR (RSV, Flu A&B, Covid) Anterior Nasal Swab     Status: None   Collection Time: 06/11/23  4:52 PM   Specimen: Anterior Nasal Swab  Result Value Ref Range Status   SARS Coronavirus 2 by RT PCR NEGATIVE NEGATIVE Final    Comment: (NOTE) SARS-CoV-2 target nucleic acids are NOT DETECTED.  The SARS-CoV-2 RNA is generally detectable in upper respiratory specimens during the acute phase of infection. The lowest concentration of SARS-CoV-2 viral copies this assay can detect is 138 copies/mL. A negative result does not preclude SARS-Cov-2 infection and should not be used as the sole basis for treatment or other patient management decisions. A negative result may occur with  improper specimen collection/handling, submission of specimen other than nasopharyngeal swab, presence of viral mutation(s) within the areas targeted by this assay, and inadequate number of viral copies(<138 copies/mL). A negative result must be combined with clinical observations, patient history, and epidemiological information. The expected result is Negative.  Fact Sheet for Patients:  bloggercourse.com  Fact Sheet for Healthcare Providers:  seriousbroker.it  This test  is no t yet approved or cleared by the [place]  FDA and  has been authorized for detection and/or diagnosis of SARS-CoV-2 by FDA under an Emergency Use Authorization (EUA). This EUA will remain  in effect (meaning this test can be used) for the duration of the COVID-19 declaration under Section 564(b)(1) of the Act, 21 U.S.C.section 360bbb-3(b)(1), unless the authorization is terminated  or revoked sooner.  Influenza A by PCR NEGATIVE NEGATIVE Final   Influenza B by PCR NEGATIVE NEGATIVE Final    Comment: (NOTE) The Xpert Xpress SARS-CoV-2/FLU/RSV plus assay is intended as an aid in the diagnosis of influenza from Nasopharyngeal swab specimens and should not be used as a sole basis for treatment. Nasal washings and aspirates are unacceptable for Xpert Xpress SARS-CoV-2/FLU/RSV testing.  Fact Sheet for Patients: bloggercourse.com  Fact Sheet for Healthcare Providers: seriousbroker.it  This test is not yet approved or cleared by the [place]  FDA and has been authorized for detection and/or diagnosis of SARS-CoV-2 by FDA under an Emergency Use Authorization (EUA). This EUA will remain in effect (meaning this test can be used) for the duration of the COVID-19 declaration under Section 564(b)(1) of the Act, 21 U.S.C. section 360bbb-3(b)(1), unless the authorization is terminated or revoked.     Resp Syncytial Virus by PCR NEGATIVE NEGATIVE Final    Comment: (NOTE) Fact Sheet for Patients: bloggercourse.com  Fact Sheet for Healthcare Providers: seriousbroker.it  This test is not yet approved or cleared by the [place]  FDA and has been authorized for detection and/or diagnosis of SARS-CoV-2 by FDA under an Emergency Use Authorization (EUA). This EUA will remain in effect (meaning this test can be used) for the duration of the COVID-19 declaration under Section  564(b)(1) of the Act, 21 U.S.C. section 360bbb-3(b)(1), unless the authorization is terminated or revoked.  Performed at [facility], [address] [place], [place] [address]   Blood Culture (routine x 2)     Status: Abnormal   Collection Time: 06/11/23  5:08 PM   Specimen: BLOOD  Result Value Ref Range Status   Specimen Description   Final    BLOOD RIGHT ANTECUBITAL Performed at [facility], [address] [place], [place] [address]    Special Requests   Final    BOTTLES DRAWN AEROBIC AND ANAEROBIC Blood Culture adequate volume Performed at [facility], [address] [place], [place] [address]    Culture  Setup Time   Final    GRAM NEGATIVE RODS IN BOTH AEROBIC AND ANAEROBIC BOTTLES CRITICAL RESULT CALLED TO, READ BACK BY AND VERIFIED WITH: PHARMD A.ARLINGTON AT 9073 ON 06/12/2023 BY T.SAAD. Performed at [facility], [address] [place], [place] [address]    Culture KLEBSIELLA ORNITHINOLYTICA (A)  Final   Report Status 06/14/2023 FINAL  Final   Organism ID, Bacteria KLEBSIELLA ORNITHINOLYTICA  Final      Susceptibility   Klebsiella ornithinolytica - MIC*    AMPICILLIN  RESISTANT Resistant     CEFEPIME  <=0.12 SENSITIVE Sensitive     CEFTAZIDIME <=1 SENSITIVE Sensitive     CEFTRIAXONE  <=0.25 SENSITIVE Sensitive     CIPROFLOXACIN <=0.25 SENSITIVE Sensitive     GENTAMICIN <=1 SENSITIVE Sensitive     IMIPENEM <=0.25 SENSITIVE Sensitive     TRIMETH/SULFA <=20 SENSITIVE Sensitive     AMPICILLIN /SULBACTAM <=2 SENSITIVE Sensitive     PIP/TAZO <=4 SENSITIVE Sensitive ug/mL    * KLEBSIELLA ORNITHINOLYTICA  Blood Culture (routine x 2)     Status: Abnormal   Collection Time: 06/11/23  5:08 PM   Specimen: BLOOD  Result Value Ref Range Status   Specimen Description   Final    BLOOD LEFT ANTECUBITAL Performed at [facility] [address] [place], [place] [address]    Special Requests    Final    BOTTLES DRAWN AEROBIC AND ANAEROBIC Blood Culture adequate volume Performed  at [facility], [address] [place], [place] [address]    Culture  Setup Time   Final    GRAM NEGATIVE RODS IN BOTH AEROBIC AND ANAEROBIC BOTTLES CRITICAL VALUE NOTED.  VALUE IS CONSISTENT WITH PREVIOUSLY REPORTED AND CALLED VALUE. GRAM POSITIVE COCCI AEROBIC BOTTLE ONLY CRITICAL RESULT CALLED TO, READ BACK BY AND VERIFIED WITH: PHARMD A ALLINGTON [id number] AT 1125 BY CM    Culture (A)  Final    KLEBSIELLA ORNITHINOLYTICA SUSCEPTIBILITIES PERFORMED ON PREVIOUS CULTURE WITHIN THE LAST 5 DAYS. STAPHYLOCOCCUS HAEMOLYTICUS THE SIGNIFICANCE OF ISOLATING THIS ORGANISM FROM A SINGLE SET OF BLOOD CULTURES WHEN MULTIPLE SETS ARE DRAWN IS UNCERTAIN. PLEASE NOTIFY THE MICROBIOLOGY DEPARTMENT WITHIN ONE WEEK IF SPECIATION AND SENSITIVITIES ARE REQUIRED. Performed at [facility], [address] [place], [place] [address]    Report Status 06/15/2023 FINAL  Final  Blood Culture ID Panel (Reflexed)     Status: Abnormal   Collection Time: 06/11/23  5:08 PM  Result Value Ref Range Status   Enterococcus faecalis NOT DETECTED NOT DETECTED Final   Enterococcus Faecium NOT DETECTED NOT DETECTED Final   Listeria monocytogenes NOT DETECTED NOT DETECTED Final   Staphylococcus species NOT DETECTED NOT DETECTED Final   Staphylococcus aureus (BCID) NOT DETECTED NOT DETECTED Final   Staphylococcus epidermidis NOT DETECTED NOT DETECTED Final   Staphylococcus lugdunensis NOT DETECTED NOT DETECTED Final   Streptococcus species NOT DETECTED NOT DETECTED Final   Streptococcus agalactiae NOT DETECTED NOT DETECTED Final   Streptococcus pneumoniae NOT DETECTED NOT DETECTED Final   Streptococcus pyogenes NOT DETECTED NOT DETECTED Final   A.calcoaceticus-baumannii NOT DETECTED NOT DETECTED Final   Bacteroides fragilis NOT DETECTED NOT DETECTED Final   Enterobacterales DETECTED (A) NOT DETECTED Final     Comment: Enterobacterales represent a large order of gram negative bacteria, not a single organism. Refer to culture for further identification. CRITICAL RESULT CALLED TO, READ BACK BY AND VERIFIED WITH: PHARMD A.ARLINGTON AT 9073 ON 06/12/2023 BY T.SAAD.    Enterobacter cloacae complex NOT DETECTED NOT DETECTED Final   Escherichia coli NOT DETECTED NOT DETECTED Final   Klebsiella aerogenes NOT DETECTED NOT DETECTED Final   Klebsiella oxytoca NOT DETECTED NOT DETECTED Final   Klebsiella pneumoniae NOT DETECTED NOT DETECTED Final   Proteus species NOT DETECTED NOT DETECTED Final   Salmonella species NOT DETECTED NOT DETECTED Final   Serratia marcescens NOT DETECTED NOT DETECTED Final   Haemophilus influenzae NOT DETECTED NOT DETECTED Final   Neisseria meningitidis NOT DETECTED NOT DETECTED Final   Pseudomonas aeruginosa NOT DETECTED NOT DETECTED Final   Stenotrophomonas maltophilia NOT DETECTED NOT DETECTED Final   Candida albicans NOT DETECTED NOT DETECTED Final   Candida auris NOT DETECTED NOT DETECTED Final   Candida glabrata NOT DETECTED NOT DETECTED Final   Candida krusei NOT DETECTED NOT DETECTED Final   Candida parapsilosis NOT DETECTED NOT DETECTED Final   Candida tropicalis NOT DETECTED NOT DETECTED Final   Cryptococcus neoformans/gattii NOT DETECTED NOT DETECTED Final   CTX-M ESBL NOT DETECTED NOT DETECTED Final   Carbapenem resistance IMP NOT DETECTED NOT DETECTED Final   Carbapenem resistance KPC NOT DETECTED NOT DETECTED Final   Carbapenem resistance NDM NOT DETECTED NOT DETECTED Final   Carbapenem resist OXA 48 LIKE NOT DETECTED NOT DETECTED Final   Carbapenem resistance VIM NOT DETECTED NOT DETECTED Final    Comment: Performed at [facility], [address], [place], [place] [address]  Blood Culture ID Panel (  Reflexed)     Status: Abnormal   Collection Time: 06/11/23  5:08 PM  Result Value Ref Range Status   Enterococcus faecalis NOT DETECTED NOT DETECTED Final    Enterococcus Faecium NOT DETECTED NOT DETECTED Final   Listeria monocytogenes NOT DETECTED NOT DETECTED Final   Staphylococcus species DETECTED (A) NOT DETECTED Final    Comment: CRITICAL RESULT CALLED TO, READ BACK BY AND VERIFIED WITH: PHARMD A ALLINGTON [id number] AT 1125 BY CM    Staphylococcus aureus (BCID) NOT DETECTED NOT DETECTED Final   Staphylococcus epidermidis NOT DETECTED NOT DETECTED Final   Staphylococcus lugdunensis NOT DETECTED NOT DETECTED Final   Streptococcus species NOT DETECTED NOT DETECTED Final   Streptococcus agalactiae NOT DETECTED NOT DETECTED Final   Streptococcus pneumoniae NOT DETECTED NOT DETECTED Final   Streptococcus pyogenes NOT DETECTED NOT DETECTED Final   A.calcoaceticus-baumannii NOT DETECTED NOT DETECTED Final   Bacteroides fragilis NOT DETECTED NOT DETECTED Final   Enterobacterales DETECTED (A) NOT DETECTED Final    Comment: Enterobacterales represent a large order of gram negative bacteria, not a single organism. Refer to culture for further identification. CRITICAL RESULT CALLED TO, READ BACK BY AND VERIFIED WITH: PHARMD A ALLINGTON [id number] AT 1125 BY CM    Enterobacter cloacae complex NOT DETECTED NOT DETECTED Final   Escherichia coli NOT DETECTED NOT DETECTED Final   Klebsiella aerogenes NOT DETECTED NOT DETECTED Final   Klebsiella oxytoca NOT DETECTED NOT DETECTED Final   Klebsiella pneumoniae NOT DETECTED NOT DETECTED Final   Proteus species NOT DETECTED NOT DETECTED Final   Salmonella species NOT DETECTED NOT DETECTED Final   Serratia marcescens NOT DETECTED NOT DETECTED Final   Haemophilus influenzae NOT DETECTED NOT DETECTED Final   Neisseria meningitidis NOT DETECTED NOT DETECTED Final   Pseudomonas aeruginosa NOT DETECTED NOT DETECTED Final   Stenotrophomonas maltophilia NOT DETECTED NOT DETECTED Final   Candida albicans NOT DETECTED NOT DETECTED Final   Candida auris NOT DETECTED NOT DETECTED Final   Candida glabrata NOT DETECTED NOT  DETECTED Final   Candida krusei NOT DETECTED NOT DETECTED Final   Candida parapsilosis NOT DETECTED NOT DETECTED Final   Candida tropicalis NOT DETECTED NOT DETECTED Final   Cryptococcus neoformans/gattii NOT DETECTED NOT DETECTED Final   CTX-M ESBL NOT DETECTED NOT DETECTED Final   Carbapenem resistance IMP NOT DETECTED NOT DETECTED Final   Carbapenem resistance KPC NOT DETECTED NOT DETECTED Final   Carbapenem resistance NDM NOT DETECTED NOT DETECTED Final   Carbapenem resist OXA 48 LIKE NOT DETECTED NOT DETECTED Final   Carbapenem resistance VIM NOT DETECTED NOT DETECTED Final    Comment: Performed at [facility], [address] [place], [place] [address]  Blood culture (routine x 2)     Status: Abnormal   Collection Time: 06/11/23  8:45 PM   Specimen: BLOOD RIGHT HAND  Result Value Ref Range Status   Specimen Description   Final    BLOOD RIGHT HAND Performed at [facility], [address] [place], [place] [address]    Special Requests   Final    BOTTLES DRAWN AEROBIC AND ANAEROBIC Blood Culture results may not be optimal due to an inadequate volume of blood received in culture bottles Performed at [facility], [address], [place], [place] [address]    Culture  Setup Time   Final    GRAM NEGATIVE RODS ANAEROBIC BOTTLE ONLY CRITICAL VALUE NOTED.  VALUE IS CONSISTENT WITH PREVIOUSLY REPORTED AND CALLED VALUE.  Culture (A)  Final    KLEBSIELLA ORNITHINOLYTICA SUSCEPTIBILITIES PERFORMED ON PREVIOUS CULTURE WITHIN THE LAST 5 DAYS. STENOTROPHOMONAS MALTOPHILIA CRITICAL RESULT CALLED TO, READ BACK BY AND VERIFIED WITH: PHARMD CHRISTELLA MILLET [id number] AT 920 BY CM Performed at [facility], [address] [place], [place] [address]    Report Status 06/17/2023 FINAL  Final   Organism ID, Bacteria STENOTROPHOMONAS MALTOPHILIA  Final      Susceptibility   Stenotrophomonas maltophilia - MIC*    LEVOFLOXACIN 0.25 SENSITIVE Sensitive      TRIMETH/SULFA <=20 SENSITIVE Sensitive     * STENOTROPHOMONAS MALTOPHILIA  Blood culture (routine x 2)     Status: Abnormal   Collection Time: 06/11/23  9:00 PM   Specimen: BLOOD  Result Value Ref Range Status   Specimen Description   Final    BLOOD LEFT ANTECUBITAL Performed at [facility], [address] [place] [address], [place], [place] [address]    Special Requests   Final    BOTTLES DRAWN AEROBIC AND ANAEROBIC Blood Culture results may not be optimal due to an inadequate volume of blood received in culture bottles Performed at [facility], [address] [place], [place] [address]    Culture  Setup Time   Final    GRAM NEGATIVE RODS AEROBIC BOTTLE ONLY CRITICAL VALUE NOTED.  VALUE IS CONSISTENT WITH PREVIOUSLY REPORTED AND CALLED VALUE.    Culture (A)  Final    KLEBSIELLA ORNITHINOLYTICA SUSCEPTIBILITIES PERFORMED ON PREVIOUS CULTURE WITHIN THE LAST 5 DAYS. Performed at [facility], [address], [place], [place] [address]    Report Status 06/14/2023 FINAL  Final  Cath Tip Culture     Status: Abnormal   Collection Time: 06/12/23  5:14 PM   Specimen: Catheter Tip  Result Value Ref Range Status   Specimen Description   Final    CATH TIP Performed at [facility], [address] [place], [place] [address]    Special Requests   Final    NONE Performed at [facility], [address], [place], [place] [address]    Culture RAOULTELLA PLANTICOLA (A)  Final   Report Status 06/14/2023 FINAL  Final   Organism ID, Bacteria RAOULTELLA PLANTICOLA  Final      Susceptibility   Raoultella planticola - MIC*    AMPICILLIN  >=32 RESISTANT Resistant     CEFEPIME  <=0.12 SENSITIVE Sensitive     CEFTAZIDIME <=1 SENSITIVE Sensitive     CEFTRIAXONE  <=0.25 SENSITIVE Sensitive     CIPROFLOXACIN <=0.25 SENSITIVE Sensitive     GENTAMICIN <=1 SENSITIVE Sensitive     IMIPENEM 0.5 SENSITIVE Sensitive     TRIMETH/SULFA <=20  SENSITIVE Sensitive     AMPICILLIN /SULBACTAM 4 SENSITIVE Sensitive     PIP/TAZO 8 SENSITIVE Sensitive ug/mL    * RAOULTELLA PLANTICOLA  Culture, blood (Routine X 2) w Reflex to ID Panel     Status: None (Preliminary result)   Collection Time: 06/16/23  2:16 PM   Specimen: BLOOD RIGHT ARM  Result Value Ref Range Status   Specimen Description   Final    BLOOD RIGHT ARM Performed at [facility], [address] [place], [place] [address]    Special Requests   Final    BOTTLES DRAWN AEROBIC AND ANAEROBIC Blood Culture results may not be optimal due to an inadequate volume of blood received in culture bottles Performed at [facility], [address], [place], [place] [address]  Culture  Setup Time   Final    BUDDING YEAST SEEN AEROBIC BOTTLE ONLY CRITICAL RESULT CALLED TO, READ BACK BY AND VERIFIED WITH: PHARMD E JACKSON 06/18/2023 @ 0255 BY AB Performed at [facility], [address] [address], [place], [place] [address]    Culture YEAST  Final   Report Status PENDING  Incomplete  Blood Culture ID Panel (Reflexed)     Status: None   Collection Time: 06/16/23  2:16 PM  Result Value Ref Range Status   Enterococcus faecalis NOT DETECTED NOT DETECTED Final   Enterococcus Faecium NOT DETECTED NOT DETECTED Final   Listeria monocytogenes NOT DETECTED NOT DETECTED Final   Staphylococcus species NOT DETECTED NOT DETECTED Final   Staphylococcus aureus (BCID) NOT DETECTED NOT DETECTED Final   Staphylococcus epidermidis NOT DETECTED NOT DETECTED Final   Staphylococcus lugdunensis NOT DETECTED NOT DETECTED Final   Streptococcus species NOT DETECTED NOT DETECTED Final   Streptococcus agalactiae NOT DETECTED NOT DETECTED Final   Streptococcus pneumoniae NOT DETECTED NOT DETECTED Final   Streptococcus pyogenes NOT DETECTED NOT DETECTED Final   A.calcoaceticus-baumannii NOT DETECTED NOT DETECTED Final   Bacteroides fragilis NOT DETECTED NOT DETECTED Final    Enterobacterales NOT DETECTED NOT DETECTED Final   Enterobacter cloacae complex NOT DETECTED NOT DETECTED Final   Escherichia coli NOT DETECTED NOT DETECTED Final   Klebsiella aerogenes NOT DETECTED NOT DETECTED Final   Klebsiella oxytoca NOT DETECTED NOT DETECTED Final   Klebsiella pneumoniae NOT DETECTED NOT DETECTED Final   Proteus species NOT DETECTED NOT DETECTED Final   Salmonella species NOT DETECTED NOT DETECTED Final   Serratia marcescens NOT DETECTED NOT DETECTED Final   Haemophilus influenzae NOT DETECTED NOT DETECTED Final   Neisseria meningitidis NOT DETECTED NOT DETECTED Final   Pseudomonas aeruginosa NOT DETECTED NOT DETECTED Final   Stenotrophomonas maltophilia NOT DETECTED NOT DETECTED Final   Candida albicans NOT DETECTED NOT DETECTED Final   Candida auris NOT DETECTED NOT DETECTED Final   Candida glabrata NOT DETECTED NOT DETECTED Final   Candida krusei NOT DETECTED NOT DETECTED Final   Candida parapsilosis NOT DETECTED NOT DETECTED Final   Candida tropicalis NOT DETECTED NOT DETECTED Final   Cryptococcus neoformans/gattii NOT DETECTED NOT DETECTED Final    Comment: Performed at [facility], [address] [place], [place] [address]  Culture, blood (Routine X 2) w Reflex to ID Panel     Status: None (Preliminary result)   Collection Time: 06/16/23  2:21 PM   Specimen: BLOOD RIGHT ARM  Result Value Ref Range Status   Specimen Description   Final    BLOOD RIGHT ARM Performed at [facility], [address], [place], [place] [address]    Special Requests   Final    BOTTLES DRAWN AEROBIC AND ANAEROBIC Blood Culture results may not be optimal due to an inadequate volume of blood received in culture bottles Performed at [facility], [address] [place], [place] [address]    Culture   Final    NO GROWTH 2 DAYS Performed at [facility], [address], [place], [place] [address]    Report Status PENDING  Incomplete          Radiology Studies: No results found.         LOS: 7 days   Time spent= 35 mins    Burgess JAYSON Dare, MD [facility]  If 7PM-7AM, please contact night-coverage  06/18/2023, 12:35 PM

## 2023-06-19 ENCOUNTER — Other Ambulatory Visit (HOSPITAL_COMMUNITY): Payer: Self-pay

## 2023-06-19 ENCOUNTER — Encounter: Payer: Self-pay | Admitting: Hematology

## 2023-06-19 ENCOUNTER — Other Ambulatory Visit: Payer: Self-pay

## 2023-06-19 DIAGNOSIS — K7460 Unspecified cirrhosis of liver: Secondary | ICD-10-CM

## 2023-06-19 DIAGNOSIS — B377 Candidal sepsis: Secondary | ICD-10-CM | POA: Diagnosis not present

## 2023-06-19 DIAGNOSIS — B961 Klebsiella pneumoniae [K. pneumoniae] as the cause of diseases classified elsewhere: Secondary | ICD-10-CM

## 2023-06-19 DIAGNOSIS — T80212D Local infection due to central venous catheter, subsequent encounter: Secondary | ICD-10-CM | POA: Diagnosis not present

## 2023-06-19 DIAGNOSIS — B182 Chronic viral hepatitis C: Secondary | ICD-10-CM | POA: Diagnosis not present

## 2023-06-19 DIAGNOSIS — R7881 Bacteremia: Secondary | ICD-10-CM | POA: Diagnosis not present

## 2023-06-19 LAB — GLUCOSE, CAPILLARY
Glucose-Capillary: 104 mg/dL — ABNORMAL HIGH (ref 70–99)
Glucose-Capillary: 147 mg/dL — ABNORMAL HIGH (ref 70–99)
Glucose-Capillary: 150 mg/dL — ABNORMAL HIGH (ref 70–99)
Glucose-Capillary: 85 mg/dL (ref 70–99)

## 2023-06-19 MED ORDER — AMOXICILLIN-POT CLAVULANATE 875-125 MG PO TABS
1.0000 | ORAL_TABLET | Freq: Two times a day (BID) | ORAL | 0 refills | Status: AC
  Filled 2023-06-19: qty 14, 7d supply, fill #0

## 2023-06-19 MED ORDER — FLUCONAZOLE 200 MG PO TABS
400.0000 mg | ORAL_TABLET | Freq: Every day | ORAL | 0 refills | Status: AC
  Filled 2023-06-19: qty 24, 12d supply, fill #0

## 2023-06-19 MED ORDER — FLUCONAZOLE 200 MG PO TABS
800.0000 mg | ORAL_TABLET | Freq: Once | ORAL | Status: AC
Start: 2023-06-19 — End: 2023-06-19
  Administered 2023-06-19: 800 mg via ORAL
  Filled 2023-06-19: qty 4

## 2023-06-19 MED ORDER — FLUCONAZOLE 200 MG PO TABS
400.0000 mg | ORAL_TABLET | Freq: Every day | ORAL | Status: DC
  Administered 2023-06-20: 400 mg via ORAL
  Filled 2023-06-19: qty 2

## 2023-06-19 MED ORDER — AMOXICILLIN-POT CLAVULANATE 875-125 MG PO TABS
1.0000 | ORAL_TABLET | Freq: Two times a day (BID) | ORAL | Status: DC
  Administered 2023-06-19 – 2023-06-20 (×3): 1 via ORAL
  Filled 2023-06-19 (×3): qty 1

## 2023-06-19 NOTE — Progress Notes (Signed)
 Subjective:  No new complaints  Antibiotics:  Anti-infectives (From admission, onward)    Start     Dose/Rate Route Frequency Ordered Stop   06/20/23 1000  fluconazole  (DIFLUCAN ) tablet 400 mg        400 mg Oral Daily 06/19/23 0946 07/02/23 0959   06/20/23 0000  fluconazole  (DIFLUCAN ) 200 MG tablet        400 mg Oral Daily 06/19/23 1029 07/02/23 2359   06/19/23 1045  fluconazole  (DIFLUCAN ) tablet 800 mg        800 mg Oral  Once 06/19/23 0946     06/19/23 1045  amoxicillin -clavulanate (AUGMENTIN ) 875-125 MG per tablet 1 tablet        1 tablet Oral Every 12 hours 06/19/23 0946 06/27/23 0959   06/19/23 0800  micafungin  (MYCAMINE ) 100 mg in sodium chloride  0.9 % 100 mL IVPB  Status:  Discontinued        100 mg 105 mL/hr over 1 Hours Intravenous Every 24 hours 06/18/23 0828 06/19/23 0946   06/19/23 0000  amoxicillin -clavulanate (AUGMENTIN ) 875-125 MG tablet        1 tablet Oral Every 12 hours 06/19/23 1029 06/26/23 2359   06/18/23 0415  micafungin  (MYCAMINE ) 150 mg in sodium chloride  0.9 % 100 mL IVPB  Status:  Discontinued        150 mg 107.5 mL/hr over 1 Hours Intravenous Every 24 hours 06/18/23 0328 06/18/23 0828   06/15/23 1000  Ampicillin -Sulbactam (UNASYN ) 3 g in sodium chloride  0.9 % 100 mL IVPB  Status:  Discontinued        3 g 200 mL/hr over 30 Minutes Intravenous Every 6 hours 06/15/23 0851 06/19/23 0946   06/14/23 2200  vancomycin  (VANCOREADY) IVPB 1500 mg/300 mL  Status:  Discontinued        1,500 mg 150 mL/hr over 120 Minutes Intravenous Every 24 hours 06/14/23 1405 06/15/23 0851   06/13/23 1700  ceFEPIme  (MAXIPIME ) 2 g in sodium chloride  0.9 % 100 mL IVPB  Status:  Discontinued        2 g 200 mL/hr over 30 Minutes Intravenous Every 8 hours 06/13/23 1425 06/15/23 0851   06/12/23 2200  vancomycin  (VANCOREADY) IVPB 1250 mg/250 mL  Status:  Discontinued        1,250 mg 166.7 mL/hr over 90 Minutes Intravenous Every 24 hours 06/12/23 1205 06/14/23 1405   06/12/23  1800  vancomycin  (VANCOCIN ) IVPB 1000 mg/200 mL premix  Status:  Discontinued        1,000 mg 200 mL/hr over 60 Minutes Intravenous Every 24 hours 06/11/23 2232 06/12/23 0946   06/12/23 0900  ceFEPIme  (MAXIPIME ) 2 g in sodium chloride  0.9 % 100 mL IVPB  Status:  Discontinued        2 g 200 mL/hr over 30 Minutes Intravenous Every 12 hours 06/11/23 2232 06/13/23 1425   06/11/23 2200  metroNIDAZOLE  (FLAGYL ) IVPB 500 mg  Status:  Discontinued        500 mg 100 mL/hr over 60 Minutes Intravenous Every 12 hours 06/11/23 2145 06/12/23 0946   06/11/23 2130  vancomycin  (VANCOREADY) IVPB 1250 mg/250 mL        1,250 mg 166.7 mL/hr over 90 Minutes Intravenous  Once 06/11/23 2046 06/11/23 2316   06/11/23 2100  ceFEPIme  (MAXIPIME ) 2 g in sodium chloride  0.9 % 100 mL IVPB        2 g 200 mL/hr over 30 Minutes Intravenous  Once 06/11/23 2046 06/11/23 2135  Medications: Scheduled Meds:  amoxicillin -clavulanate  1 tablet Oral Q12H   dicyclomine   10 mg Oral TID AC & HS   [START ON 06/20/2023] fluconazole   400 mg Oral Daily   fluconazole   800 mg Oral Once   insulin  aspart  0-5 Units Subcutaneous QHS   insulin  aspart  0-9 Units Subcutaneous TID WC   insulin  glargine-yfgn  15 Units Subcutaneous Daily   methocarbamol   1,000 mg Oral TID   pantoprazole   40 mg Oral BID AC   polyethylene glycol  17 g Oral Daily   senna-docusate  2 tablet Oral BID   Continuous Infusions:   PRN Meds:.acetaminophen  **OR** acetaminophen , feeding supplement, HYDROcodone -acetaminophen , ondansetron  **OR** ondansetron  (ZOFRAN ) IV, mouth rinse, polyvinyl alcohol     Objective: Weight change:   Intake/Output Summary (Last 24 hours) at 06/19/2023 1035 Last data filed at 06/18/2023 1230 Gross per 24 hour  Intake 480 ml  Output --  Net 480 ml    Blood pressure (!) 141/83, pulse 84, temperature 98.8 F (37.1 C), resp. rate 19, height 5' 10 (1.778 m), weight 63.5 kg, SpO2 100%. Temp:  [98.8 F (37.1 C)-99 F (37.2 C)] 98.8  F (37.1 C) (02/07 0649) Pulse Rate:  [84-92] 84 (02/07 0649) Resp:  [16-19] 19 (02/07 0649) BP: (134-141)/(73-83) 141/83 (02/07 0649) SpO2:  [98 %-100 %] 100 % (02/07 0649)  Physical Exam: Physical Exam Constitutional:      Appearance: He is well-developed.  HENT:     Head: Normocephalic and atraumatic.  Eyes:     Conjunctiva/sclera: Conjunctivae normal.  Cardiovascular:     Rate and Rhythm: Normal rate and regular rhythm.  Pulmonary:     Effort: Pulmonary effort is normal. No respiratory distress.     Breath sounds: No wheezing.  Abdominal:     General: There is no distension.     Palpations: Abdomen is soft.  Musculoskeletal:        General: Normal range of motion.     Cervical back: Normal range of motion and neck supple.  Skin:    General: Skin is warm and dry.     Findings: No erythema or rash.  Neurological:     General: No focal deficit present.     Mental Status: He is alert and oriented to person, place, and time.  Psychiatric:        Mood and Affect: Mood normal.        Behavior: Behavior normal.        Thought Content: Thought content normal.        Judgment: Judgment normal.      CBC:    BMET Recent Labs    06/17/23 0437  NA 136  K 3.6  CL 105  CO2 23  GLUCOSE 194*  BUN 9  CREATININE 0.72  CALCIUM 8.2*     Liver Panel  No results for input(s): PROT, ALBUMIN , AST, ALT, ALKPHOS, BILITOT, BILIDIR, IBILI in the last 72 hours.     Sedimentation Rate No results for input(s): ESRSEDRATE in the last 72 hours. C-Reactive Protein No results for input(s): CRP in the last 72 hours.  Micro Results: Recent Results (from the past 720 hours)  Resp panel by RT-PCR (RSV, Flu A&B, Covid) Anterior Nasal Swab     Status: None   Collection Time: 06/11/23  4:52 PM   Specimen: Anterior Nasal Swab  Result Value Ref Range Status   SARS Coronavirus 2 by RT PCR NEGATIVE NEGATIVE Final    Comment: (NOTE) SARS-CoV-2 target nucleic  acids are NOT DETECTED.  The SARS-CoV-2 RNA is generally detectable in upper respiratory specimens during the acute phase of infection. The lowest concentration of SARS-CoV-2 viral copies this assay can detect is 138 copies/mL. A negative result does not preclude SARS-Cov-2 infection and should not be used as the sole basis for treatment or other patient management decisions. A negative result may occur with  improper specimen collection/handling, submission of specimen other than nasopharyngeal swab, presence of viral mutation(s) within the areas targeted by this assay, and inadequate number of viral copies(<138 copies/mL). A negative result must be combined with clinical observations, patient history, and epidemiological information. The expected result is Negative.  Fact Sheet for Patients:  bloggercourse.com  Fact Sheet for Healthcare Providers:  seriousbroker.it  This test is no t yet approved or cleared by the [place]  FDA and  has been authorized for detection and/or diagnosis of SARS-CoV-2 by FDA under an Emergency Use Authorization (EUA). This EUA will remain  in effect (meaning this test can be used) for the duration of the COVID-19 declaration under Section 564(b)(1) of the Act, 21 U.S.C.section 360bbb-3(b)(1), unless the authorization is terminated  or revoked sooner.       Influenza A by PCR NEGATIVE NEGATIVE Final   Influenza B by PCR NEGATIVE NEGATIVE Final    Comment: (NOTE) The Xpert Xpress SARS-CoV-2/FLU/RSV plus assay is intended as an aid in the diagnosis of influenza from Nasopharyngeal swab specimens and should not be used as a sole basis for treatment. Nasal washings and aspirates are unacceptable for Xpert Xpress SARS-CoV-2/FLU/RSV testing.  Fact Sheet for Patients: bloggercourse.com  Fact Sheet for Healthcare Providers: seriousbroker.it  This  test is not yet approved or cleared by the [place]  FDA and has been authorized for detection and/or diagnosis of SARS-CoV-2 by FDA under an Emergency Use Authorization (EUA). This EUA will remain in effect (meaning this test can be used) for the duration of the COVID-19 declaration under Section 564(b)(1) of the Act, 21 U.S.C. section 360bbb-3(b)(1), unless the authorization is terminated or revoked.     Resp Syncytial Virus by PCR NEGATIVE NEGATIVE Final    Comment: (NOTE) Fact Sheet for Patients: bloggercourse.com  Fact Sheet for Healthcare Providers: seriousbroker.it  This test is not yet approved or cleared by the [place]  FDA and has been authorized for detection and/or diagnosis of SARS-CoV-2 by FDA under an Emergency Use Authorization (EUA). This EUA will remain in effect (meaning this test can be used) for the duration of the COVID-19 declaration under Section 564(b)(1) of the Act, 21 U.S.C. section 360bbb-3(b)(1), unless the authorization is terminated or revoked.  Performed at [facility], [address] [place], [place] [address]   Blood Culture (routine x 2)     Status: Abnormal   Collection Time: 06/11/23  5:08 PM   Specimen: BLOOD  Result Value Ref Range Status   Specimen Description   Final    BLOOD RIGHT ANTECUBITAL Performed at [facility], [address], [place], [place] [address]    Special Requests   Final    BOTTLES DRAWN AEROBIC AND ANAEROBIC Blood Culture adequate volume Performed at [facility], [address], [place], [place] [address]    Culture  Setup Time   Final    GRAM NEGATIVE RODS IN BOTH AEROBIC AND ANAEROBIC BOTTLES CRITICAL RESULT CALLED TO, READ BACK BY AND VERIFIED WITH: PHARMD A.ARLINGTON AT 9073 ON 06/12/2023 BY T.SAAD. Performed at [facility], [address] [place], [place] [address]    Culture  KLEBSIELLA ORNITHINOLYTICA (A)  Final   Report Status 06/14/2023 FINAL  Final   Organism ID, Bacteria KLEBSIELLA ORNITHINOLYTICA  Final      Susceptibility   Klebsiella ornithinolytica - MIC*    AMPICILLIN  RESISTANT Resistant     CEFEPIME  <=0.12 SENSITIVE Sensitive     CEFTAZIDIME <=1 SENSITIVE Sensitive     CEFTRIAXONE  <=0.25 SENSITIVE Sensitive     CIPROFLOXACIN <=0.25 SENSITIVE Sensitive     GENTAMICIN <=1 SENSITIVE Sensitive     IMIPENEM <=0.25 SENSITIVE Sensitive     TRIMETH/SULFA <=20 SENSITIVE Sensitive     AMPICILLIN /SULBACTAM <=2 SENSITIVE Sensitive     PIP/TAZO <=4 SENSITIVE Sensitive ug/mL    * KLEBSIELLA ORNITHINOLYTICA  Blood Culture (routine x 2)     Status: Abnormal   Collection Time: 06/11/23  5:08 PM   Specimen: BLOOD  Result Value Ref Range Status   Specimen Description   Final    BLOOD LEFT ANTECUBITAL Performed at [facility], [address], [place], [place] [address]    Special Requests   Final    BOTTLES DRAWN AEROBIC AND ANAEROBIC Blood Culture adequate volume Performed at [facility], [address] [place], [place] [address]    Culture  Setup Time   Final    GRAM NEGATIVE RODS IN BOTH AEROBIC AND ANAEROBIC BOTTLES CRITICAL VALUE NOTED.  VALUE IS CONSISTENT WITH PREVIOUSLY REPORTED AND CALLED VALUE. GRAM POSITIVE COCCI AEROBIC BOTTLE ONLY CRITICAL RESULT CALLED TO, READ BACK BY AND VERIFIED WITH: PHARMD A ALLINGTON [id number] AT 1125 BY CM    Culture (A)  Final    KLEBSIELLA ORNITHINOLYTICA SUSCEPTIBILITIES PERFORMED ON PREVIOUS CULTURE WITHIN THE LAST 5 DAYS. STAPHYLOCOCCUS HAEMOLYTICUS THE SIGNIFICANCE OF ISOLATING THIS ORGANISM FROM A SINGLE SET OF BLOOD CULTURES WHEN MULTIPLE SETS ARE DRAWN IS UNCERTAIN. PLEASE NOTIFY THE MICROBIOLOGY DEPARTMENT WITHIN ONE WEEK IF SPECIATION AND SENSITIVITIES ARE REQUIRED. Performed at [facility], [address], [place], [place] [address]    Report Status 06/15/2023  FINAL  Final  Blood Culture ID Panel (Reflexed)     Status: Abnormal   Collection Time: 06/11/23  5:08 PM  Result Value Ref Range Status   Enterococcus faecalis NOT DETECTED NOT DETECTED Final   Enterococcus Faecium NOT DETECTED NOT DETECTED Final   Listeria monocytogenes NOT DETECTED NOT DETECTED Final   Staphylococcus species NOT DETECTED NOT DETECTED Final   Staphylococcus aureus (BCID) NOT DETECTED NOT DETECTED Final   Staphylococcus epidermidis NOT DETECTED NOT DETECTED Final   Staphylococcus lugdunensis NOT DETECTED NOT DETECTED Final   Streptococcus species NOT DETECTED NOT DETECTED Final   Streptococcus agalactiae NOT DETECTED NOT DETECTED Final   Streptococcus pneumoniae NOT DETECTED NOT DETECTED Final   Streptococcus pyogenes NOT DETECTED NOT DETECTED Final   A.calcoaceticus-baumannii NOT DETECTED NOT DETECTED Final   Bacteroides fragilis NOT DETECTED NOT DETECTED Final   Enterobacterales DETECTED (A) NOT DETECTED Final    Comment: Enterobacterales represent a large order of gram negative bacteria, not a single organism. Refer to culture for further identification. CRITICAL RESULT CALLED TO, READ BACK BY AND VERIFIED WITH: PHARMD A.ARLINGTON AT 9073 ON 06/12/2023 BY T.SAAD.    Enterobacter cloacae complex NOT DETECTED NOT DETECTED Final   Escherichia coli NOT DETECTED NOT DETECTED Final   Klebsiella aerogenes NOT DETECTED NOT DETECTED Final   Klebsiella oxytoca NOT DETECTED NOT DETECTED Final   Klebsiella pneumoniae NOT DETECTED NOT DETECTED Final   Proteus  species NOT DETECTED NOT DETECTED Final   Salmonella species NOT DETECTED NOT DETECTED Final   Serratia marcescens NOT DETECTED NOT DETECTED Final   Haemophilus influenzae NOT DETECTED NOT DETECTED Final   Neisseria meningitidis NOT DETECTED NOT DETECTED Final   Pseudomonas aeruginosa NOT DETECTED NOT DETECTED Final   Stenotrophomonas maltophilia NOT DETECTED NOT DETECTED Final   Candida albicans NOT DETECTED NOT  DETECTED Final   Candida auris NOT DETECTED NOT DETECTED Final   Candida glabrata NOT DETECTED NOT DETECTED Final   Candida krusei NOT DETECTED NOT DETECTED Final   Candida parapsilosis NOT DETECTED NOT DETECTED Final   Candida tropicalis NOT DETECTED NOT DETECTED Final   Cryptococcus neoformans/gattii NOT DETECTED NOT DETECTED Final   CTX-M ESBL NOT DETECTED NOT DETECTED Final   Carbapenem resistance IMP NOT DETECTED NOT DETECTED Final   Carbapenem resistance KPC NOT DETECTED NOT DETECTED Final   Carbapenem resistance NDM NOT DETECTED NOT DETECTED Final   Carbapenem resist OXA 48 LIKE NOT DETECTED NOT DETECTED Final   Carbapenem resistance VIM NOT DETECTED NOT DETECTED Final    Comment: Performed at [facility], [address], [place], [place] [address]  Blood Culture ID Panel (Reflexed)     Status: Abnormal   Collection Time: 06/11/23  5:08 PM  Result Value Ref Range Status   Enterococcus faecalis NOT DETECTED NOT DETECTED Final   Enterococcus Faecium NOT DETECTED NOT DETECTED Final   Listeria monocytogenes NOT DETECTED NOT DETECTED Final   Staphylococcus species DETECTED (A) NOT DETECTED Final    Comment: CRITICAL RESULT CALLED TO, READ BACK BY AND VERIFIED WITH: PHARMD A ALLINGTON [id number] AT 1125 BY CM    Staphylococcus aureus (BCID) NOT DETECTED NOT DETECTED Final   Staphylococcus epidermidis NOT DETECTED NOT DETECTED Final   Staphylococcus lugdunensis NOT DETECTED NOT DETECTED Final   Streptococcus species NOT DETECTED NOT DETECTED Final   Streptococcus agalactiae NOT DETECTED NOT DETECTED Final   Streptococcus pneumoniae NOT DETECTED NOT DETECTED Final   Streptococcus pyogenes NOT DETECTED NOT DETECTED Final   A.calcoaceticus-baumannii NOT DETECTED NOT DETECTED Final   Bacteroides fragilis NOT DETECTED NOT DETECTED Final   Enterobacterales DETECTED (A) NOT DETECTED Final    Comment: Enterobacterales represent a large order of gram negative bacteria, not a single  organism. Refer to culture for further identification. CRITICAL RESULT CALLED TO, READ BACK BY AND VERIFIED WITH: PHARMD A ALLINGTON [id number] AT 1125 BY CM    Enterobacter cloacae complex NOT DETECTED NOT DETECTED Final   Escherichia coli NOT DETECTED NOT DETECTED Final   Klebsiella aerogenes NOT DETECTED NOT DETECTED Final   Klebsiella oxytoca NOT DETECTED NOT DETECTED Final   Klebsiella pneumoniae NOT DETECTED NOT DETECTED Final   Proteus species NOT DETECTED NOT DETECTED Final   Salmonella species NOT DETECTED NOT DETECTED Final   Serratia marcescens NOT DETECTED NOT DETECTED Final   Haemophilus influenzae NOT DETECTED NOT DETECTED Final   Neisseria meningitidis NOT DETECTED NOT DETECTED Final   Pseudomonas aeruginosa NOT DETECTED NOT DETECTED Final   Stenotrophomonas maltophilia NOT DETECTED NOT DETECTED Final   Candida albicans NOT DETECTED NOT DETECTED Final   Candida auris NOT DETECTED NOT DETECTED Final   Candida glabrata NOT DETECTED NOT DETECTED Final   Candida krusei NOT DETECTED NOT DETECTED Final   Candida parapsilosis NOT DETECTED NOT DETECTED Final   Candida tropicalis NOT DETECTED NOT DETECTED Final   Cryptococcus neoformans/gattii NOT DETECTED NOT DETECTED Final   CTX-M ESBL NOT DETECTED NOT DETECTED Final  Carbapenem resistance IMP NOT DETECTED NOT DETECTED Final   Carbapenem resistance KPC NOT DETECTED NOT DETECTED Final   Carbapenem resistance NDM NOT DETECTED NOT DETECTED Final   Carbapenem resist OXA 48 LIKE NOT DETECTED NOT DETECTED Final   Carbapenem resistance VIM NOT DETECTED NOT DETECTED Final    Comment: Performed at [facility], [address] [place], [place] [address]  Blood culture (routine x 2)     Status: Abnormal   Collection Time: 06/11/23  8:45 PM   Specimen: BLOOD RIGHT HAND  Result Value Ref Range Status   Specimen Description   Final    BLOOD RIGHT HAND Performed at [facility], [address], [place], [place] [address]     Special Requests   Final    BOTTLES DRAWN AEROBIC AND ANAEROBIC Blood Culture results may not be optimal due to an inadequate volume of blood received in culture bottles Performed at [facility], [address], [place], [place] [address]    Culture  Setup Time   Final    GRAM NEGATIVE RODS ANAEROBIC BOTTLE ONLY CRITICAL VALUE NOTED.  VALUE IS CONSISTENT WITH PREVIOUSLY REPORTED AND CALLED VALUE.    Culture (A)  Final    KLEBSIELLA ORNITHINOLYTICA SUSCEPTIBILITIES PERFORMED ON PREVIOUS CULTURE WITHIN THE LAST 5 DAYS. STENOTROPHOMONAS MALTOPHILIA CRITICAL RESULT CALLED TO, READ BACK BY AND VERIFIED WITH: PHARMD CHRISTELLA MILLET [id number] AT 920 BY CM Performed at [facility], [address] [place], [place] [address]    Report Status 06/17/2023 FINAL  Final   Organism ID, Bacteria STENOTROPHOMONAS MALTOPHILIA  Final      Susceptibility   Stenotrophomonas maltophilia - MIC*    LEVOFLOXACIN 0.25 SENSITIVE Sensitive     TRIMETH/SULFA <=20 SENSITIVE Sensitive     * STENOTROPHOMONAS MALTOPHILIA  Blood culture (routine x 2)     Status: Abnormal   Collection Time: 06/11/23  9:00 PM   Specimen: BLOOD  Result Value Ref Range Status   Specimen Description   Final    BLOOD LEFT ANTECUBITAL Performed at [facility], [address] [place], [place] [address]    Special Requests   Final    BOTTLES DRAWN AEROBIC AND ANAEROBIC Blood Culture results may not be optimal due to an inadequate volume of blood received in culture bottles Performed at [facility], [address], [place], [place] [address]    Culture  Setup Time   Final    GRAM NEGATIVE RODS AEROBIC BOTTLE ONLY CRITICAL VALUE NOTED.  VALUE IS CONSISTENT WITH PREVIOUSLY REPORTED AND CALLED VALUE.    Culture (A)  Final    KLEBSIELLA ORNITHINOLYTICA SUSCEPTIBILITIES PERFORMED ON PREVIOUS CULTURE WITHIN THE LAST 5 DAYS. Performed at [facility], [address]  [place], [place] [address]    Report Status 06/14/2023 FINAL  Final  Cath Tip Culture     Status: Abnormal   Collection Time: 06/12/23  5:14 PM   Specimen: Catheter Tip  Result Value Ref Range Status   Specimen Description   Final    CATH TIP Performed at [facility], [address] [place], [place] [address]    Special Requests   Final    NONE Performed at [facility], [address], [place], [place] [address]    Culture RAOULTELLA PLANTICOLA (A)  Final   Report Status 06/14/2023 FINAL  Final   Organism ID, Bacteria RAOULTELLA PLANTICOLA  Final  Susceptibility   Raoultella planticola - MIC*    AMPICILLIN  >=32 RESISTANT Resistant     CEFEPIME  <=0.12 SENSITIVE Sensitive     CEFTAZIDIME <=1 SENSITIVE Sensitive     CEFTRIAXONE  <=0.25 SENSITIVE Sensitive     CIPROFLOXACIN <=0.25 SENSITIVE Sensitive     GENTAMICIN <=1 SENSITIVE Sensitive     IMIPENEM 0.5 SENSITIVE Sensitive     TRIMETH/SULFA <=20 SENSITIVE Sensitive     AMPICILLIN /SULBACTAM 4 SENSITIVE Sensitive     PIP/TAZO 8 SENSITIVE Sensitive ug/mL    * RAOULTELLA PLANTICOLA  Culture, blood (Routine X 2) w Reflex to ID Panel     Status: Abnormal (Preliminary result)   Collection Time: 06/16/23  2:16 PM   Specimen: BLOOD RIGHT ARM  Result Value Ref Range Status   Specimen Description   Final    BLOOD RIGHT ARM Performed at [facility], [address], [place], [place] [address]    Special Requests   Final    BOTTLES DRAWN AEROBIC AND ANAEROBIC Blood Culture results may not be optimal due to an inadequate volume of blood received in culture bottles Performed at [facility], [address], [place], [place] [address]    Culture  Setup Time   Final    BUDDING YEAST SEEN AEROBIC BOTTLE ONLY CRITICAL RESULT CALLED TO, READ BACK BY AND VERIFIED WITH: PHARMD E JACKSON 06/18/2023 @ 0255 BY AB Performed at [facility], [address], [place], [place]  [address]    Culture CANDIDA LUSITANIAE (A)  Final   Report Status PENDING  Incomplete  Blood Culture ID Panel (Reflexed)     Status: None   Collection Time: 06/16/23  2:16 PM  Result Value Ref Range Status   Enterococcus faecalis NOT DETECTED NOT DETECTED Final   Enterococcus Faecium NOT DETECTED NOT DETECTED Final   Listeria monocytogenes NOT DETECTED NOT DETECTED Final   Staphylococcus species NOT DETECTED NOT DETECTED Final   Staphylococcus aureus (BCID) NOT DETECTED NOT DETECTED Final   Staphylococcus epidermidis NOT DETECTED NOT DETECTED Final   Staphylococcus lugdunensis NOT DETECTED NOT DETECTED Final   Streptococcus species NOT DETECTED NOT DETECTED Final   Streptococcus agalactiae NOT DETECTED NOT DETECTED Final   Streptococcus pneumoniae NOT DETECTED NOT DETECTED Final   Streptococcus pyogenes NOT DETECTED NOT DETECTED Final   A.calcoaceticus-baumannii NOT DETECTED NOT DETECTED Final   Bacteroides fragilis NOT DETECTED NOT DETECTED Final   Enterobacterales NOT DETECTED NOT DETECTED Final   Enterobacter cloacae complex NOT DETECTED NOT DETECTED Final   Escherichia coli NOT DETECTED NOT DETECTED Final   Klebsiella aerogenes NOT DETECTED NOT DETECTED Final   Klebsiella oxytoca NOT DETECTED NOT DETECTED Final   Klebsiella pneumoniae NOT DETECTED NOT DETECTED Final   Proteus species NOT DETECTED NOT DETECTED Final   Salmonella species NOT DETECTED NOT DETECTED Final   Serratia marcescens NOT DETECTED NOT DETECTED Final   Haemophilus influenzae NOT DETECTED NOT DETECTED Final   Neisseria meningitidis NOT DETECTED NOT DETECTED Final   Pseudomonas aeruginosa NOT DETECTED NOT DETECTED Final   Stenotrophomonas maltophilia NOT DETECTED NOT DETECTED Final   Candida albicans NOT DETECTED NOT DETECTED Final   Candida auris NOT DETECTED NOT DETECTED Final   Candida glabrata NOT DETECTED NOT DETECTED Final   Candida krusei NOT DETECTED NOT DETECTED Final   Candida parapsilosis NOT  DETECTED NOT DETECTED Final   Candida tropicalis NOT DETECTED NOT DETECTED Final   Cryptococcus neoformans/gattii NOT DETECTED NOT DETECTED Final    Comment:  Performed at [facility], [address], [place], [place] [address]  Culture, blood (Routine X 2) w Reflex to ID Panel     Status: None (Preliminary result)   Collection Time: 06/16/23  2:21 PM   Specimen: BLOOD RIGHT ARM  Result Value Ref Range Status   Specimen Description   Final    BLOOD RIGHT ARM Performed at [facility], [address], [place], [place] [address]    Special Requests   Final    BOTTLES DRAWN AEROBIC AND ANAEROBIC Blood Culture results may not be optimal due to an inadequate volume of blood received in culture bottles Performed at [facility], [address] [place], [place] [address]    Culture   Final    NO GROWTH 3 DAYS Performed at [facility], [address] [place], [place] [address]    Report Status PENDING  Incomplete  Culture, blood (Routine X 2) w Reflex to ID Panel     Status: None (Preliminary result)   Collection Time: 06/18/23  5:03 AM   Specimen: BLOOD  Result Value Ref Range Status   Specimen Description   Final    BLOOD BLOOD LEFT ARM Performed at [facility], [address] [place], [place] [address]    Special Requests   Final    BOTTLES DRAWN AEROBIC AND ANAEROBIC Blood Culture results may not be optimal due to an inadequate volume of blood received in culture bottles Performed at [facility], [address], [place], [place] [address]    Culture   Final    NO GROWTH 1 DAY Performed at [facility], [address], [place], [place] [address]    Report Status PENDING  Incomplete  Culture, blood (Routine X 2) w Reflex to ID Panel     Status: None (Preliminary result)   Collection Time: 06/18/23  5:03 AM   Specimen: BLOOD  Result Value Ref Range Status   Specimen Description   Final    BLOOD  BLOOD LEFT HAND Performed at [facility], [address], [place], [place] [address]    Special Requests   Final    BOTTLES DRAWN AEROBIC AND ANAEROBIC Blood Culture results may not be optimal due to an inadequate volume of blood received in culture bottles Performed at [facility], [address], [place], [place] [address]    Culture   Final    NO GROWTH 1 DAY Performed at [facility], [address], [place], [place] [address]    Report Status PENDING  Incomplete    Studies/Results: No results found.    Assessment/Plan:  INTERVAL HISTORY: Patient seen by ophthalmology who excluded fungal endophthalmitis.  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 male with current Klebsiella  due to port that now has been removed   #1 Port infection due to Klebsiella  Our plan was to DC him with augmentin  to complete 2 weeks of post port removal antibiotics However as mentioned below we now have seen S Maltophila on culture albeit like Staph Haemolyticus in only 1/2 cultures and NOW when cultures repeated again has a yeast in 1/2 cultures  Now that we have clarity on his clinical condition we can go ahead and switch him over to Augmentin  and he can complete an additional 7 days of antibiotics to give  him 2 weeks of post port removal active antibiotics against the Klebsiella  #2 S. Maltophila in 1/2 blood cultures. While not a common skin organism like the Coag neg staph it can be on the skin and is a fairly low virulence organism  I have felt there is a good chance of it also being a contaminant as is the case for Staph H  To try to shed clarity I ordered repeat blood cultures since if the S maltophila is truly a pathogen it should grow since we were NOT targetting it at present  While we now are not seeing S maltophila the patient has an apparent candida in 1/2 blood cultures  #3 Candidemia: Unlike for S maltophilia we always treat even 1 culture that is positive for Candida as being consistent with true candidemia.  Therefore we started micafungin . Greatly appreciate ophthalmology having seen the patient and rule out fungal endophthalmitis.  Because his Candida species is reliably sensitive to fluconazole  we will switch him over to fluconazole  with a loading dose today and then he can go out on 400 mg tomorrow to complete a total of 2 weeks of antifungal therapy  I have personally spent 50 minutes involved in face-to-face and non-face-to-face activities for this patient on the day of the visit. Professional time spent includes the following activities: Preparing to see the patient (review of tests), Obtaining and/or reviewing separately obtained history (admission/discharge record), Performing a medically appropriate examination and/or evaluation , Ordering medications/tests/procedures, referring and communicating with other health care professionals, Documenting clinical information in the EMR, Independently interpreting results (not separately reported), Communicating results to the patient/family/caregiver, Counseling and educating the patient/family/caregiver and Care coordination (not separately reported).   Evaluation of the patient requires complex antimicrobial therapy evaluation,  counseling , isolation needs to reduce disease transmission and risk assessment and mitigation.      LOS: 8 days   Jomarie Fleeta Rothman 06/19/2023, 10:35 AM

## 2023-06-19 NOTE — Plan of Care (Signed)
  Problem: Metabolic: Goal: Ability to maintain appropriate glucose levels will improve Outcome: Progressing   Problem: Clinical Measurements: Goal: Signs and symptoms of infection will decrease Outcome: Progressing   Problem: Activity: Goal: Risk for activity intolerance will decrease Outcome: Progressing

## 2023-06-19 NOTE — Progress Notes (Signed)
   06/19/23 1006  TOC Brief Assessment  Insurance and Status Reviewed  Patient has primary care physician Yes  Home environment has been reviewed Home with spouse  Prior level of function: Independent/modified independent  Prior/Current Home Services Current home services (Has home aides through [place] benefits)  Social Drivers of Health Review SDOH reviewed no interventions necessary  Readmission risk has been reviewed Yes  Transition of care needs no transition of care needs at this time

## 2023-06-19 NOTE — Progress Notes (Signed)
 PROGRESS NOTE    Jovan Colligan  FMW:[id number] DOB: 1955/06/14 DOA: 06/11/2023 PCP: [name], MD    Brief Narrative:   68 y.o. 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 He had low grade fever, tachycardia, leukocytosis (WBC 12.9k), lactic acid elevation (2.8 x2). CT abd/pelvis showed progression of disease with increase in the size of liver mass, progression of nodal metastasis. Increase in the size of the metastatic implants in the inferior right rectus sheath and right pelvic sidewall. 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    He was admitted, started on IV antibiotics after blood cultures were drawn and ultimately found to have Klebsiella ornithinolytica bacteremia with identical resistance pattern to prior infection. Port was removed 1/31. ID consulted. Plan is to repeat blood cultures 2/4 and transition to oral antibiotics once these have resulted. Repeat blood cultures grew yeast.       Assessment & Plan:  Principal Problem:   Bacteremia due to Klebsiella pneumoniae Active Problems:   SIRS (systemic inflammatory response syndrome) (HCC)   Secondary esophageal varices without bleeding (HCC)   Hepatic cirrhosis due to chronic hepatitis C infection (HCC)   Type 2 diabetes mellitus (HCC)   Hypertension associated with diabetes (HCC)   Hepatocellular carcinoma (HCC)   Thrombocytopenia (HCC)   GAVE (gastric antral vascular ectasia)   Cancer associated pain   Hyponatremia   Port or reservoir infection   History of hepatocellular carcinoma   Infection due to Stenotrophomonas maltophilia   Fever     Klebsiella ornithinolytica bacteremia:   -Similar history of sepsis in December 2024.  Refractory infection in 4 out of 4 side.  Catheter tip eventually grew Raoultella planticola.  Port was removed on 1/31.  To prove clearance another blood cultures on 2/flora which remains negative. -ID is recommending total 2 weeks of Augmentin  post port removal.  Currently on Unasyn .  Switching po augmentin  today.    Budding yeast/fungemia - Seen on the cultures on 2/4, updated this morning.  Management per ID.  I will also consulted Dr. Maree from ophthalmology per ID request will see the patient later today for Opto exam - negative exam per Optho, appreciate input - Candida species sensitive to diflucan .  Loading dose today.  Able to DC with 2 weeks antifungal therapy.      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  - Will aim to maximize po intake by not restricting diet at this time.    Stage IV hepatocellular carcinoma -Unfortunately patient is showing metastatic progression of his malignancy.  Seen by oncology team.  Planning on outpatient referral to radiation oncology.  Limited options at this time. -Pain control.   Cirrhosis with esophageal varices:  - Would ideally be on nonselective beta blocker, though no longer taking coreg .     IDT2DM:  Recent HbA1c 8.4%, uncontrolled hyperglycemia.  -Currently home p.o. medications on hold.  Long-acting insulin  with sliding scale.  Adjust as necessary.    Iron  deficiency due to chronic blood loss anemia due to GAVE Severely high risk for bleeding therefore no longer anticoagulation.  Continue PPI twice daily.   Chronic portal vein thrombosis No longer anticoagulation  as patient is very high risk of bleeding.   Chronic thrombocytopenia -Continue to monitor   Constipation:  -Bowel regimen    HTN : Normotensive off medications.    Hypokalemia:  - Supplement   Lactic acid elevation:  Due to sepsis and reduced hepatic  clearance.    DVT prophylaxis: SCDs Start: 06/11/23 2144    Code Status: Full Code Family Communication:   Status is: Inpatient Remains inpatient appropriate because: Ongoing pain control and management of sepsis/bacteremia Dispo:  Able to DC home tomorrow if patient remains well overnight.         Subjective: Patient seen and examined at bedside.  No complaints.  Eating and drinking well.  No fevers/chills.       Examination:   General exam: Appears calm and comfortable  Respiratory system: Clear to auscultation. Respiratory effort normal. Cardiovascular system: S1 & S2 heard, RRR. No JVD, murmurs, rubs, gallops or clicks. No pedal edema. Gastrointestinal system: Abdomen is nondistended, soft and nontender. No organomegaly or masses felt. Normal bowel sounds heard. Central nervous system: Alert and oriented. No focal neurological deficits. Extremities: Symmetric 5 x 5 power. Skin: No rashes, lesions or ulcers Psychiatry: Judgement and insight appear normal. Mood & affect appropriate.     Diet Orders (From admission, onward)     Start     Ordered   06/12/23 1737  Diet regular Room service appropriate? Yes; Fluid consistency: Thin  Diet effective now       Comments: Advance as tolerated to pre procedural diet.  Question Answer Comment  Room service appropriate? Yes   Fluid consistency: Thin      06/12/23 1736            Objective: Vitals:   06/18/23 2047 06/19/23 0434 06/19/23 0649 06/19/23 1243  BP: (!) 141/76 136/83 (!) 141/83 (!) 143/85  Pulse: 91 86 84 84  Resp: 18 18 19 17   Temp: 99 F (37.2 C) 98.8 F (37.1 C) 98.8 F (37.1 C) 98.7 F (37.1 C)  TempSrc:    Oral  SpO2: 100% 98% 100% 99%  Weight:      Height:       No intake or output data in the 24 hours ending 06/19/23 1728  Filed Weights   06/12/23 0739  Weight: 63.5 kg    Scheduled Meds:  amoxicillin -clavulanate  1 tablet Oral Q12H   dicyclomine   10 mg Oral TID AC & HS   [START ON  06/20/2023] fluconazole   400 mg Oral Daily   insulin  aspart  0-5 Units Subcutaneous QHS   insulin  aspart  0-9 Units Subcutaneous TID WC   insulin  glargine-yfgn  15 Units Subcutaneous Daily   methocarbamol   1,000 mg Oral TID   pantoprazole   40 mg Oral BID AC   polyethylene glycol  17 g Oral Daily   senna-docusate  2 tablet Oral BID   Continuous Infusions:    Nutritional status     Body mass index is 20.09 kg/m.  Data Reviewed:   CBC: Recent Labs  Lab 06/13/23 0454 06/14/23 0439 06/15/23 0422 06/16/23 0438 06/17/23 0437  WBC 7.9 7.9 5.9 4.6 4.7  HGB 8.4* 8.7* 8.6* 8.1* 8.4*  HCT 25.3* 26.2* 26.0* 24.9* 25.9*  MCV 92.0 91.6 91.9 91.5 93.2  PLT 69* 92* 96* 98* 107*   Basic Metabolic Panel: Recent Labs  Lab 06/13/23 0454 06/14/23 0439 06/15/23 0422 06/16/23 0438 06/17/23 0437  NA 130* 131* 134* 135 136  K 4.0 3.6 3.4* 3.3* 3.6  CL 100 102 104 102 105  CO2 24 23 23 23 23   GLUCOSE 195* 169* 178* 172* 194*  BUN 20 14 12 11 9   CREATININE 1.01 0.76 0.90 0.58* 0.72  CALCIUM 8.4* 8.6* 8.6* 8.5* 8.2*   GFR: Estimated Creatinine Clearance: 78.3 mL/min (by C-G formula based on SCr of 0.72 mg/dL). Liver Function Tests: No results for input(s): AST, ALT, ALKPHOS, BILITOT, PROT, ALBUMIN  in the last 168 hours.  No results for input(s): LIPASE, AMYLASE in the last 168 hours. No results for input(s): AMMONIA in the last 168 hours. Coagulation Profile: No results for input(s): INR, PROTIME in the last 168 hours.  Cardiac Enzymes: No results for input(s): CKTOTAL, CKMB, CKMBINDEX, TROPONINI in the last 168 hours. BNP (last 3 results) No results for input(s): PROBNP in the last 8760 hours. HbA1C: No results for input(s): HGBA1C in the last 72 hours. CBG: Recent Labs  Lab 06/18/23 1628 06/18/23 2048 06/19/23 0732 06/19/23 1143 06/19/23 1646  GLUCAP 126* 108* 104* 147* 150*   Lipid Profile: No results for input(s): CHOL, HDL,  LDLCALC, TRIG, CHOLHDL, LDLDIRECT in the last 72 hours. Thyroid  Function Tests: No results for input(s): TSH, T4TOTAL, FREET4, T3FREE, THYROIDAB in the last 72 hours. Anemia Panel: No results for input(s): VITAMINB12, FOLATE, FERRITIN, TIBC, IRON , RETICCTPCT in the last 72 hours. Sepsis Labs: No results for input(s): PROCALCITON, LATICACIDVEN in the last 168 hours.   Recent Results (from the past 240 hours)  Resp panel by RT-PCR (RSV, Flu A&B, Covid) Anterior Nasal Swab     Status: None   Collection Time: 06/11/23  4:52 PM   Specimen: Anterior Nasal Swab  Result Value Ref Range Status   SARS Coronavirus 2 by RT PCR NEGATIVE NEGATIVE Final    Comment: (NOTE) SARS-CoV-2 target nucleic acids are NOT DETECTED.  The SARS-CoV-2 RNA is generally detectable in upper respiratory specimens during the acute phase of infection. The lowest concentration of SARS-CoV-2 viral copies this assay can detect is 138 copies/mL. A negative result does not preclude SARS-Cov-2 infection and should not be used as the sole basis for treatment or other patient management decisions. A negative result may occur with  improper specimen collection/handling, submission of specimen other than nasopharyngeal swab, presence of viral mutation(s) within the areas targeted by this assay, and inadequate number of viral copies(<138 copies/mL). A negative result must be combined with clinical observations, patient history, and epidemiological information. The expected result is Negative.  Fact Sheet for Patients:  bloggercourse.com  Fact Sheet for Healthcare Providers:  seriousbroker.it  This test is no t yet approved or cleared by the [place]  FDA and  has been authorized for detection and/or diagnosis of SARS-CoV-2 by FDA under an Emergency Use Authorization (EUA). This EUA will remain  in effect (meaning this test can be used)  for the duration of the COVID-19 declaration under Section 564(b)(1) of the Act, 21 U.S.C.section 360bbb-3(b)(1), unless the authorization is terminated  or revoked sooner.       Influenza A by PCR NEGATIVE NEGATIVE Final   Influenza B by PCR NEGATIVE NEGATIVE Final    Comment: (NOTE) The Xpert Xpress SARS-CoV-2/FLU/RSV plus assay is intended as an aid in the diagnosis of influenza from Nasopharyngeal swab specimens and should not be used as a sole basis for treatment. Nasal washings and aspirates are unacceptable for Xpert Xpress SARS-CoV-2/FLU/RSV testing.  Fact Sheet for Patients: bloggercourse.com  Fact Sheet for Healthcare Providers: seriousbroker.it  This test is not yet approved or cleared by  the [place]  FDA and has been authorized for detection and/or diagnosis of SARS-CoV-2 by FDA under an Emergency Use Authorization (EUA). This EUA will remain in effect (meaning this test can be used) for the duration of the COVID-19 declaration under Section 564(b)(1) of the Act, 21 U.S.C. section 360bbb-3(b)(1), unless the authorization is terminated or revoked.     Resp Syncytial Virus by PCR NEGATIVE NEGATIVE Final    Comment: (NOTE) Fact Sheet for Patients: bloggercourse.com  Fact Sheet for Healthcare Providers: seriousbroker.it  This test is not yet approved or cleared by the [place]  FDA and has been authorized for detection and/or diagnosis of SARS-CoV-2 by FDA under an Emergency Use Authorization (EUA). This EUA will remain in effect (meaning this test can be used) for the duration of the COVID-19 declaration under Section 564(b)(1) of the Act, 21 U.S.C. section 360bbb-3(b)(1), unless the authorization is terminated or revoked.  Performed at [facility], [address] [place], [place] [address]   Blood Culture (routine x 2)     Status:  Abnormal   Collection Time: 06/11/23  5:08 PM   Specimen: BLOOD  Result Value Ref Range Status   Specimen Description   Final    BLOOD RIGHT ANTECUBITAL Performed at [facility], [address] [place], [place] [address]    Special Requests   Final    BOTTLES DRAWN AEROBIC AND ANAEROBIC Blood Culture adequate volume Performed at [facility], [address] [place], [place] [address]    Culture  Setup Time   Final    GRAM NEGATIVE RODS IN BOTH AEROBIC AND ANAEROBIC BOTTLES CRITICAL RESULT CALLED TO, READ BACK BY AND VERIFIED WITH: PHARMD A.ARLINGTON AT 9073 ON 06/12/2023 BY T.SAAD. Performed at [facility], [address] [place], [place] [address]    Culture KLEBSIELLA ORNITHINOLYTICA (A)  Final   Report Status 06/14/2023 FINAL  Final   Organism ID, Bacteria KLEBSIELLA ORNITHINOLYTICA  Final      Susceptibility   Klebsiella ornithinolytica - MIC*    AMPICILLIN  RESISTANT Resistant     CEFEPIME  <=0.12 SENSITIVE Sensitive     CEFTAZIDIME <=1 SENSITIVE Sensitive     CEFTRIAXONE  <=0.25 SENSITIVE Sensitive     CIPROFLOXACIN <=0.25 SENSITIVE Sensitive     GENTAMICIN <=1 SENSITIVE Sensitive     IMIPENEM <=0.25 SENSITIVE Sensitive     TRIMETH/SULFA <=20 SENSITIVE Sensitive     AMPICILLIN /SULBACTAM <=2 SENSITIVE Sensitive     PIP/TAZO <=4 SENSITIVE Sensitive ug/mL    * KLEBSIELLA ORNITHINOLYTICA  Blood Culture (routine x 2)     Status: Abnormal   Collection Time: 06/11/23  5:08 PM   Specimen: BLOOD  Result Value Ref Range Status   Specimen Description   Final    BLOOD LEFT ANTECUBITAL Performed at [facility], [address] [place], [place] [address]    Special Requests   Final    BOTTLES DRAWN AEROBIC AND ANAEROBIC Blood Culture adequate volume Performed at [facility], [address], [place], [place] [address]    Culture  Setup Time   Final    GRAM NEGATIVE RODS IN BOTH AEROBIC AND  ANAEROBIC BOTTLES CRITICAL VALUE NOTED.  VALUE IS CONSISTENT WITH PREVIOUSLY REPORTED AND CALLED VALUE. GRAM POSITIVE COCCI AEROBIC BOTTLE ONLY CRITICAL RESULT CALLED TO, READ BACK BY AND VERIFIED WITH: PHARMD A ALLINGTON [id number] AT 1125 BY CM    Culture (A)  Final    KLEBSIELLA ORNITHINOLYTICA SUSCEPTIBILITIES PERFORMED ON  PREVIOUS CULTURE WITHIN THE LAST 5 DAYS. STAPHYLOCOCCUS HAEMOLYTICUS THE SIGNIFICANCE OF ISOLATING THIS ORGANISM FROM A SINGLE SET OF BLOOD CULTURES WHEN MULTIPLE SETS ARE DRAWN IS UNCERTAIN. PLEASE NOTIFY THE MICROBIOLOGY DEPARTMENT WITHIN ONE WEEK IF SPECIATION AND SENSITIVITIES ARE REQUIRED. Performed at [facility], [address], [place], [place] [address]    Report Status 06/15/2023 FINAL  Final  Blood Culture ID Panel (Reflexed)     Status: Abnormal   Collection Time: 06/11/23  5:08 PM  Result Value Ref Range Status   Enterococcus faecalis NOT DETECTED NOT DETECTED Final   Enterococcus Faecium NOT DETECTED NOT DETECTED Final   Listeria monocytogenes NOT DETECTED NOT DETECTED Final   Staphylococcus species NOT DETECTED NOT DETECTED Final   Staphylococcus aureus (BCID) NOT DETECTED NOT DETECTED Final   Staphylococcus epidermidis NOT DETECTED NOT DETECTED Final   Staphylococcus lugdunensis NOT DETECTED NOT DETECTED Final   Streptococcus species NOT DETECTED NOT DETECTED Final   Streptococcus agalactiae NOT DETECTED NOT DETECTED Final   Streptococcus pneumoniae NOT DETECTED NOT DETECTED Final   Streptococcus pyogenes NOT DETECTED NOT DETECTED Final   A.calcoaceticus-baumannii NOT DETECTED NOT DETECTED Final   Bacteroides fragilis NOT DETECTED NOT DETECTED Final   Enterobacterales DETECTED (A) NOT DETECTED Final    Comment: Enterobacterales represent a large order of gram negative bacteria, not a single organism. Refer to culture for further identification. CRITICAL RESULT CALLED TO, READ BACK BY AND VERIFIED WITH: PHARMD A.ARLINGTON AT 9073 ON 06/12/2023  BY T.SAAD.    Enterobacter cloacae complex NOT DETECTED NOT DETECTED Final   Escherichia coli NOT DETECTED NOT DETECTED Final   Klebsiella aerogenes NOT DETECTED NOT DETECTED Final   Klebsiella oxytoca NOT DETECTED NOT DETECTED Final   Klebsiella pneumoniae NOT DETECTED NOT DETECTED Final   Proteus species NOT DETECTED NOT DETECTED Final   Salmonella species NOT DETECTED NOT DETECTED Final   Serratia marcescens NOT DETECTED NOT DETECTED Final   Haemophilus influenzae NOT DETECTED NOT DETECTED Final   Neisseria meningitidis NOT DETECTED NOT DETECTED Final   Pseudomonas aeruginosa NOT DETECTED NOT DETECTED Final   Stenotrophomonas maltophilia NOT DETECTED NOT DETECTED Final   Candida albicans NOT DETECTED NOT DETECTED Final   Candida auris NOT DETECTED NOT DETECTED Final   Candida glabrata NOT DETECTED NOT DETECTED Final   Candida krusei NOT DETECTED NOT DETECTED Final   Candida parapsilosis NOT DETECTED NOT DETECTED Final   Candida tropicalis NOT DETECTED NOT DETECTED Final   Cryptococcus neoformans/gattii NOT DETECTED NOT DETECTED Final   CTX-M ESBL NOT DETECTED NOT DETECTED Final   Carbapenem resistance IMP NOT DETECTED NOT DETECTED Final   Carbapenem resistance KPC NOT DETECTED NOT DETECTED Final   Carbapenem resistance NDM NOT DETECTED NOT DETECTED Final   Carbapenem resist OXA 48 LIKE NOT DETECTED NOT DETECTED Final   Carbapenem resistance VIM NOT DETECTED NOT DETECTED Final    Comment: Performed at [facility], [address], [place], [place] [address]  Blood Culture ID Panel (Reflexed)     Status: Abnormal   Collection Time: 06/11/23  5:08 PM  Result Value Ref Range Status   Enterococcus faecalis NOT DETECTED NOT DETECTED Final   Enterococcus Faecium NOT DETECTED NOT DETECTED Final   Listeria monocytogenes NOT DETECTED NOT DETECTED Final   Staphylococcus species DETECTED (A) NOT DETECTED Final    Comment: CRITICAL RESULT CALLED TO, READ BACK BY AND VERIFIED  WITH: PHARMD A ALLINGTON [id number] AT 1125 BY CM    Staphylococcus aureus (BCID)  NOT DETECTED NOT DETECTED Final   Staphylococcus epidermidis NOT DETECTED NOT DETECTED Final   Staphylococcus lugdunensis NOT DETECTED NOT DETECTED Final   Streptococcus species NOT DETECTED NOT DETECTED Final   Streptococcus agalactiae NOT DETECTED NOT DETECTED Final   Streptococcus pneumoniae NOT DETECTED NOT DETECTED Final   Streptococcus pyogenes NOT DETECTED NOT DETECTED Final   A.calcoaceticus-baumannii NOT DETECTED NOT DETECTED Final   Bacteroides fragilis NOT DETECTED NOT DETECTED Final   Enterobacterales DETECTED (A) NOT DETECTED Final    Comment: Enterobacterales represent a large order of gram negative bacteria, not a single organism. Refer to culture for further identification. CRITICAL RESULT CALLED TO, READ BACK BY AND VERIFIED WITH: PHARMD A ALLINGTON [id number] AT 1125 BY CM    Enterobacter cloacae complex NOT DETECTED NOT DETECTED Final   Escherichia coli NOT DETECTED NOT DETECTED Final   Klebsiella aerogenes NOT DETECTED NOT DETECTED Final   Klebsiella oxytoca NOT DETECTED NOT DETECTED Final   Klebsiella pneumoniae NOT DETECTED NOT DETECTED Final   Proteus species NOT DETECTED NOT DETECTED Final   Salmonella species NOT DETECTED NOT DETECTED Final   Serratia marcescens NOT DETECTED NOT DETECTED Final   Haemophilus influenzae NOT DETECTED NOT DETECTED Final   Neisseria meningitidis NOT DETECTED NOT DETECTED Final   Pseudomonas aeruginosa NOT DETECTED NOT DETECTED Final   Stenotrophomonas maltophilia NOT DETECTED NOT DETECTED Final   Candida albicans NOT DETECTED NOT DETECTED Final   Candida auris NOT DETECTED NOT DETECTED Final   Candida glabrata NOT DETECTED NOT DETECTED Final   Candida krusei NOT DETECTED NOT DETECTED Final   Candida parapsilosis NOT DETECTED NOT DETECTED Final   Candida tropicalis NOT DETECTED NOT DETECTED Final   Cryptococcus neoformans/gattii NOT DETECTED NOT DETECTED  Final   CTX-M ESBL NOT DETECTED NOT DETECTED Final   Carbapenem resistance IMP NOT DETECTED NOT DETECTED Final   Carbapenem resistance KPC NOT DETECTED NOT DETECTED Final   Carbapenem resistance NDM NOT DETECTED NOT DETECTED Final   Carbapenem resist OXA 48 LIKE NOT DETECTED NOT DETECTED Final   Carbapenem resistance VIM NOT DETECTED NOT DETECTED Final    Comment: Performed at [facility], [address], [place], [place] [address]  Blood culture (routine x 2)     Status: Abnormal   Collection Time: 06/11/23  8:45 PM   Specimen: BLOOD RIGHT HAND  Result Value Ref Range Status   Specimen Description   Final    BLOOD RIGHT HAND Performed at [facility], [address] [place], [place] [address]    Special Requests   Final    BOTTLES DRAWN AEROBIC AND ANAEROBIC Blood Culture results may not be optimal due to an inadequate volume of blood received in culture bottles Performed at [facility], [address] [place], [place] [address]    Culture  Setup Time   Final    GRAM NEGATIVE RODS ANAEROBIC BOTTLE ONLY CRITICAL VALUE NOTED.  VALUE IS CONSISTENT WITH PREVIOUSLY REPORTED AND CALLED VALUE.    Culture (A)  Final    KLEBSIELLA ORNITHINOLYTICA SUSCEPTIBILITIES PERFORMED ON PREVIOUS CULTURE WITHIN THE LAST 5 DAYS. STENOTROPHOMONAS MALTOPHILIA CRITICAL RESULT CALLED TO, READ BACK BY AND VERIFIED WITH: PHARMD CHRISTELLA MILLET [id number] AT 920 BY CM Performed at [facility], [address] [place], [place] [address]    Report Status 06/17/2023 FINAL  Final   Organism ID, Bacteria STENOTROPHOMONAS MALTOPHILIA  Final      Susceptibility   Stenotrophomonas maltophilia - MIC*  LEVOFLOXACIN 0.25 SENSITIVE Sensitive     TRIMETH/SULFA <=20 SENSITIVE Sensitive     * STENOTROPHOMONAS MALTOPHILIA  Blood culture (routine x 2)     Status: Abnormal   Collection Time: 06/11/23  9:00 PM   Specimen: BLOOD  Result Value Ref Range Status   Specimen Description    Final    BLOOD LEFT ANTECUBITAL Performed at [facility], [address], [place], [place] [address]    Special Requests   Final    BOTTLES DRAWN AEROBIC AND ANAEROBIC Blood Culture results may not be optimal due to an inadequate volume of blood received in culture bottles Performed at [facility], [address] [place], [place] [address]    Culture  Setup Time   Final    GRAM NEGATIVE RODS AEROBIC BOTTLE ONLY CRITICAL VALUE NOTED.  VALUE IS CONSISTENT WITH PREVIOUSLY REPORTED AND CALLED VALUE.    Culture (A)  Final    KLEBSIELLA ORNITHINOLYTICA SUSCEPTIBILITIES PERFORMED ON PREVIOUS CULTURE WITHIN THE LAST 5 DAYS. Performed at [facility], [address], [place], [place] [address]    Report Status 06/14/2023 FINAL  Final  Cath Tip Culture     Status: Abnormal   Collection Time: 06/12/23  5:14 PM   Specimen: Catheter Tip  Result Value Ref Range Status   Specimen Description   Final    CATH TIP Performed at [facility], [address] [place], [place] [address]    Special Requests   Final    NONE Performed at [facility], [address], [place], [place] [address]    Culture RAOULTELLA PLANTICOLA (A)  Final   Report Status 06/14/2023 FINAL  Final   Organism ID, Bacteria RAOULTELLA PLANTICOLA  Final      Susceptibility   Raoultella planticola - MIC*    AMPICILLIN  >=32 RESISTANT Resistant     CEFEPIME  <=0.12 SENSITIVE Sensitive     CEFTAZIDIME <=1 SENSITIVE Sensitive     CEFTRIAXONE  <=0.25 SENSITIVE Sensitive     CIPROFLOXACIN <=0.25 SENSITIVE Sensitive     GENTAMICIN <=1 SENSITIVE Sensitive     IMIPENEM 0.5 SENSITIVE Sensitive     TRIMETH/SULFA <=20 SENSITIVE Sensitive     AMPICILLIN /SULBACTAM 4 SENSITIVE Sensitive     PIP/TAZO 8 SENSITIVE Sensitive ug/mL    * RAOULTELLA PLANTICOLA  Culture, blood (Routine X 2) w Reflex to ID Panel     Status: Abnormal (Preliminary result)    Collection Time: 06/16/23  2:16 PM   Specimen: BLOOD RIGHT ARM  Result Value Ref Range Status   Specimen Description   Final    BLOOD RIGHT ARM Performed at [facility], [address], [place], [place] [address]    Special Requests   Final    BOTTLES DRAWN AEROBIC AND ANAEROBIC Blood Culture results may not be optimal due to an inadequate volume of blood received in culture bottles Performed at [facility], [address], [place], [place] [address]    Culture  Setup Time   Final    BUDDING YEAST SEEN AEROBIC BOTTLE ONLY CRITICAL RESULT CALLED TO, READ BACK BY AND VERIFIED WITH: PHARMD E JACKSON 06/18/2023 @ 0255 BY AB Performed at [facility], [address] [place], [place] [address]    Culture CANDIDA LUSITANIAE (A)  Final   Report Status PENDING  Incomplete  Blood Culture ID Panel (Reflexed)     Status: None   Collection Time: 06/16/23  2:16  PM  Result Value Ref Range Status   Enterococcus faecalis NOT DETECTED NOT DETECTED Final   Enterococcus Faecium NOT DETECTED NOT DETECTED Final   Listeria monocytogenes NOT DETECTED NOT DETECTED Final   Staphylococcus species NOT DETECTED NOT DETECTED Final   Staphylococcus aureus (BCID) NOT DETECTED NOT DETECTED Final   Staphylococcus epidermidis NOT DETECTED NOT DETECTED Final   Staphylococcus lugdunensis NOT DETECTED NOT DETECTED Final   Streptococcus species NOT DETECTED NOT DETECTED Final   Streptococcus agalactiae NOT DETECTED NOT DETECTED Final   Streptococcus pneumoniae NOT DETECTED NOT DETECTED Final   Streptococcus pyogenes NOT DETECTED NOT DETECTED Final   A.calcoaceticus-baumannii NOT DETECTED NOT DETECTED Final   Bacteroides fragilis NOT DETECTED NOT DETECTED Final   Enterobacterales NOT DETECTED NOT DETECTED Final   Enterobacter cloacae complex NOT DETECTED NOT DETECTED Final   Escherichia coli NOT DETECTED NOT DETECTED Final   Klebsiella aerogenes NOT DETECTED NOT DETECTED Final    Klebsiella oxytoca NOT DETECTED NOT DETECTED Final   Klebsiella pneumoniae NOT DETECTED NOT DETECTED Final   Proteus species NOT DETECTED NOT DETECTED Final   Salmonella species NOT DETECTED NOT DETECTED Final   Serratia marcescens NOT DETECTED NOT DETECTED Final   Haemophilus influenzae NOT DETECTED NOT DETECTED Final   Neisseria meningitidis NOT DETECTED NOT DETECTED Final   Pseudomonas aeruginosa NOT DETECTED NOT DETECTED Final   Stenotrophomonas maltophilia NOT DETECTED NOT DETECTED Final   Candida albicans NOT DETECTED NOT DETECTED Final   Candida auris NOT DETECTED NOT DETECTED Final   Candida glabrata NOT DETECTED NOT DETECTED Final   Candida krusei NOT DETECTED NOT DETECTED Final   Candida parapsilosis NOT DETECTED NOT DETECTED Final   Candida tropicalis NOT DETECTED NOT DETECTED Final   Cryptococcus neoformans/gattii NOT DETECTED NOT DETECTED Final    Comment: Performed at [facility], [address] [place], [place] [address]  Culture, blood (Routine X 2) w Reflex to ID Panel     Status: None (Preliminary result)   Collection Time: 06/16/23  2:21 PM   Specimen: BLOOD RIGHT ARM  Result Value Ref Range Status   Specimen Description   Final    BLOOD RIGHT ARM Performed at [facility], [address] [place], [place] [address]    Special Requests   Final    BOTTLES DRAWN AEROBIC AND ANAEROBIC Blood Culture results may not be optimal due to an inadequate volume of blood received in culture bottles Performed at [facility], [address], [place], [place] [address]    Culture   Final    NO GROWTH 3 DAYS Performed at [facility], [address] [place], [place] [address]    Report Status PENDING  Incomplete  Culture, blood (Routine X 2) w Reflex to ID Panel     Status: None (Preliminary result)   Collection Time: 06/18/23  5:03 AM   Specimen: BLOOD  Result Value Ref Range Status   Specimen Description   Final    BLOOD BLOOD LEFT  ARM Performed at [facility], [address] [place], [place] [address]    Special Requests   Final    BOTTLES DRAWN AEROBIC AND ANAEROBIC Blood Culture results may not be optimal due to an inadequate volume of blood received in culture bottles Performed at [facility], [address], [place], [place] [address]    Culture   Final    NO GROWTH 1 DAY Performed at [facility], [address], [place], [place] [address]    Report Status PENDING  Incomplete  Culture, blood (Routine X 2) w Reflex to ID Panel     Status: None (Preliminary result)   Collection Time: 06/18/23  5:03 AM   Specimen: BLOOD  Result Value Ref Range Status   Specimen Description   Final    BLOOD BLOOD LEFT HAND Performed at [facility], [address], [place], [place] [address]    Special Requests   Final    BOTTLES DRAWN AEROBIC AND ANAEROBIC Blood Culture results may not be optimal due to an inadequate volume of blood received in culture bottles Performed at [facility], [address], [place], [place] [address]    Culture   Final    NO GROWTH 1 DAY Performed at [facility], [address] [place], [place] [address]    Report Status PENDING  Incomplete         Radiology Studies: No results found.         LOS: 8 days   Time spent= 35 mins    [name], MD [facility]  If 7PM-7AM, please contact night-coverage  06/19/2023, 5:28 PM

## 2023-06-20 ENCOUNTER — Other Ambulatory Visit (HOSPITAL_COMMUNITY): Payer: Self-pay

## 2023-06-20 DIAGNOSIS — B961 Klebsiella pneumoniae [K. pneumoniae] as the cause of diseases classified elsewhere: Secondary | ICD-10-CM | POA: Diagnosis not present

## 2023-06-20 DIAGNOSIS — R7881 Bacteremia: Secondary | ICD-10-CM | POA: Diagnosis not present

## 2023-06-20 LAB — GLUCOSE, CAPILLARY
Glucose-Capillary: 132 mg/dL — ABNORMAL HIGH (ref 70–99)
Glucose-Capillary: 126 mg/dL — ABNORMAL HIGH (ref 70–99)

## 2023-06-20 NOTE — Plan of Care (Signed)
 Patient stable for discharge as per MD order. Patient and spouse verbalized understanding of discharge instructions.  Problem: Education: Goal: Ability to describe self-care measures that may prevent or decrease complications (Diabetes Survival Skills Education) will improve Outcome: Adequate for Discharge Goal: Individualized Educational Video(s) Outcome: Adequate for Discharge   Problem: Coping: Goal: Ability to adjust to condition or change in health will improve Outcome: Adequate for Discharge   Problem: Fluid Volume: Goal: Ability to maintain a balanced intake and output will improve Outcome: Adequate for Discharge   Problem: Health Behavior/Discharge Planning: Goal: Ability to identify and utilize available resources and services will improve Outcome: Adequate for Discharge Goal: Ability to manage health-related needs will improve Outcome: Adequate for Discharge   Problem: Metabolic: Goal: Ability to maintain appropriate glucose levels will improve Outcome: Adequate for Discharge   Problem: Nutritional: Goal: Maintenance of adequate nutrition will improve Outcome: Adequate for Discharge Goal: Progress toward achieving an optimal weight will improve Outcome: Adequate for Discharge   Problem: Skin Integrity: Goal: Risk for impaired skin integrity will decrease Outcome: Adequate for Discharge   Problem: Tissue Perfusion: Goal: Adequacy of tissue perfusion will improve Outcome: Adequate for Discharge   Problem: Fluid Volume: Goal: Hemodynamic stability will improve Outcome: Adequate for Discharge   Problem: Clinical Measurements: Goal: Diagnostic test results will improve Outcome: Adequate for Discharge Goal: Signs and symptoms of infection will decrease Outcome: Adequate for Discharge   Problem: Respiratory: Goal: Ability to maintain adequate ventilation will improve Outcome: Adequate for Discharge   Problem: Education: Goal: Knowledge of General Education  information will improve Description: Including pain rating scale, medication(s)/side effects and non-pharmacologic comfort measures Outcome: Adequate for Discharge   Problem: Health Behavior/Discharge Planning: Goal: Ability to manage health-related needs will improve Outcome: Adequate for Discharge   Problem: Clinical Measurements: Goal: Ability to maintain clinical measurements within normal limits will improve Outcome: Adequate for Discharge Goal: Will remain free from infection Outcome: Adequate for Discharge Goal: Diagnostic test results will improve Outcome: Adequate for Discharge Goal: Respiratory complications will improve Outcome: Adequate for Discharge Goal: Cardiovascular complication will be avoided Outcome: Adequate for Discharge   Problem: Activity: Goal: Risk for activity intolerance will decrease Outcome: Adequate for Discharge   Problem: Nutrition: Goal: Adequate nutrition will be maintained Outcome: Adequate for Discharge   Problem: Coping: Goal: Level of anxiety will decrease Outcome: Adequate for Discharge   Problem: Elimination: Goal: Will not experience complications related to bowel motility Outcome: Adequate for Discharge Goal: Will not experience complications related to urinary retention Outcome: Adequate for Discharge   Problem: Pain Managment: Goal: General experience of comfort will improve and/or be controlled Outcome: Adequate for Discharge   Problem: Safety: Goal: Ability to remain free from injury will improve Outcome: Adequate for Discharge   Problem: Skin Integrity: Goal: Risk for impaired skin integrity will decrease Outcome: Adequate for Discharge

## 2023-06-20 NOTE — Plan of Care (Signed)

## 2023-06-20 NOTE — Discharge Summary (Signed)
 Physician Discharge Summary   Patient: Troy [name] MRN: [id number] DOB: 1955/06/16  Admit date:     06/11/2023  Discharge date: 06/20/23  Discharge Physician: [name]   PCP: [name], MD   Recommendations at discharge:   Ensure follow-up with Dr. [name] oncologist Ensure follow-up with infectious disease in next 1 to 2 weeks. Discharged home on 2 weeks of Augmentin  plus Diflucan .  Brief Narrative:   68 y.o. 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 He had low grade fever, tachycardia, leukocytosis (WBC 12.9k), lactic acid elevation (2.8 x2). CT abd/pelvis showed progression of disease with increase in the size of liver mass, progression of nodal metastasis. Increase in the size of the metastatic implants in the inferior right rectus sheath and right pelvic sidewall. 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    He was admitted, started on IV antibiotics after blood cultures were drawn and ultimately found to have Klebsiella ornithinolytica bacteremia with identical resistance pattern to prior infection. Port was removed 1/31. ID consulted.  Blood cultures repeated 2/4 and group Candida yeast.  ID recommended discharge on Augmentin  plus Diflucan .     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  Refractory infection in 4 out of 4 side.  Catheter tip eventually grew Raoultella planticola.  Port was removed on 1/31.  To prove clearance another blood cultures on 2/flora which remains negative. -ID is recommending total 2 weeks of Augmentin  post port removal.  Discharged home on the same.   Budding yeast/fungemia - Seen on the cultures on 2/4 which had been repeated, updated this morning.  Management per ID.   - Dr. Maree from ophthalmology per ID request evaluated patient and negative for any intraocular fungal growth. - Candida species sensitive to diflucan .  Loading dose administered and then discharged home with 2 weeks antifungal therapy.      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  - Will aim to maximize po intake by not restricting diet at this time.    Stage IV hepatocellular carcinoma -Unfortunately patient is showing metastatic progression of his malignancy.  Seen by oncology team.  Planning on outpatient referral to radiation oncology.  Limited options at this time. -Pain control. -Already has radiation oncology follow-up scheduled on Monday.   Cirrhosis with esophageal varices:  - Would ideally be on nonselective beta blocker, though no longer taking coreg .     IDT2DM:  Recent HbA1c 8.4%, uncontrolled hyperglycemia.  -Currently home p.o. medications on hold.  Long-acting insulin  with  sliding scale.  Adjust as necessary.    Iron  deficiency due to chronic blood loss anemia due to GAVE Severely high risk for bleeding therefore no longer anticoagulation.  Continue PPI twice daily.   Chronic  portal vein thrombosis No longer anticoagulation as patient is very high risk of bleeding.   Chronic thrombocytopenia -Continue to monitor   Constipation:  -Bowel regimen    HTN : Normotensive off medications.    Hypokalemia:  - Supplement         Pain control - [place]  Controlled Substance Reporting System database was reviewed. and patient was instructed, not to drive, operate heavy machinery, perform activities at heights, swimming or participation in water activities or provide baby-sitting services while on Pain, Sleep and Anxiety Medications; until their outpatient Physician has advised to do so again. Also recommended to not to take more than prescribed Pain, Sleep and Anxiety Medications.  Consultants: Infectious disease Disposition: Home Diet recommendation:  Discharge Diet Orders (From admission, onward)     Start     Ordered   06/20/23 0000  Diet - low sodium heart healthy        06/20/23 1012           Regular diet DISCHARGE MEDICATION: Allergies as of 06/20/2023   No Known Allergies      Medication List     STOP taking these medications    doxycycline 100 MG tablet Commonly known as: VIBRA-TABS   ferrous gluconate  324 MG tablet Commonly known as: FERGON       TAKE these medications    amLODipine  10 MG tablet Commonly known as: NORVASC  Take 10 mg by mouth daily as needed (for an elevated B/P).   amoxicillin -clavulanate 875-125 MG tablet Commonly known as: AUGMENTIN  Take 1 tablet by mouth every 12 (twelve) hours for 7 days. What changed: when to take this   carboxymethylcellulose 0.5 % Soln Commonly known as: REFRESH PLUS Place 1 drop into both eyes 4 (four) times daily as needed (dry eyes).   dicyclomine  10 MG capsule Commonly known as: BENTYL  Take 1 capsule (10 mg total) by mouth 4 (four) times daily -  before meals and at bedtime.   empagliflozin  25 MG Tabs tablet Commonly known as: JARDIANCE  Take 25 mg by mouth daily.    feeding supplement Liqd Take 237 mLs by mouth 2 (two) times daily between meals. What changed:  when to take this reasons to take this   fluconazole  200 MG tablet Commonly known as: DIFLUCAN  Take 2 tablets (400 mg total) by mouth daily for 12 days.   HYDROcodone -acetaminophen  5-325 MG tablet Commonly known as: NORCO/VICODIN Take 1 tablet by mouth every 6 (six) hours as needed for moderate pain (pain score 4-6).   lisinopril  20 MG tablet Commonly known as: ZESTRIL  Take 20 mg by mouth daily.   metFORMIN 850 MG tablet Commonly known as: GLUCOPHAGE Take 850 mg by mouth 2 (two) times daily.   morphine  15 MG 12 hr tablet Commonly known as: MS CONTIN  Take 15 mg by mouth every 12 (twelve) hours as needed for pain.   ondansetron  8 MG tablet Commonly known as: ZOFRAN  Take 1 tablet (8 mg total) by mouth every 8 (eight) hours as needed for nausea or vomiting.   pantoprazole  40 MG tablet Commonly known as: PROTONIX  Take 1 tablet (40 mg total) by mouth 2 (two) times daily before a meal.   promethazine  12.5 MG tablet Commonly known as: PHENERGAN  Take 2 tablets (25 mg total) by mouth  every 6 (six) hours as needed for refractory nausea / vomiting.   Semglee  (yfgn) 100 UNIT/ML Pen Generic drug: insulin  glargine-yfgn Inject 15 Units into the skin daily.   sildenafil 100 MG tablet Commonly known as: VIAGRA Take 100 mg by mouth daily as needed for erectile dysfunction.   SORAfenib  200 MG tablet Commonly known as: NEXAVAR  Take 1 tablet (200 mg total) by mouth 2 (two) times daily. Give on an empty stomach 1 hour before or 2 hours after meals.   sucralfate  1 g tablet Commonly known as: CARAFATE  Take 1 g by mouth 2 (two) times daily.        Discharge Exam: Filed Weights   06/12/23 0739  Weight: 63.5 kg   General exam: Appears calm and comfortable  Respiratory system: Clear to auscultation. Respiratory effort normal. Cardiovascular system: S1 & S2 heard, RRR. No JVD, murmurs,  rubs, gallops or clicks. No pedal edema. Gastrointestinal system: Abdomen nondistended.  With palpable small masses abdominal wall.  These are tender but rest of abdomen is nontender.  Good bowel sounds throughout. Central nervous system: Alert and oriented. No focal neurological deficits. Extremities: Symmetric 5 x 5 power. Skin: No rashes, lesions or ulcers Psychiatry: Judgement and insight appear normal. Mood & affect appropriate.  Condition at discharge: good  The results of significant diagnostics from this hospitalization (including imaging, microbiology, ancillary and laboratory) are listed below for reference.   Imaging Studies: IR REMOVAL TUN ACCESS W/ PORT W/O FL MOD SED Result Date: 06/12/2023 CLINICAL DATA:  Completed chemotherapy. No longer in need of Port a Catheter. Patient now presenting with bacteremia and request made for urgent Port a catheter removal. Port a catheter was placed on 03/20/2022 by Dr. Hughes and has functioned well throughout duration of usage. EXAM: REMOVAL OF IMPLANTED TUNNELED PORT-A-CATH MEDICATIONS: None ANESTHESIA/SEDATION: None FLUOROSCOPY: None PROCEDURE: Informed written consent was obtained from the patient after a discussion of the risk, benefits and alternatives to the procedure. The patient was positioned supine on the fluoroscopy table and the right chest Port-A-Cath site was prepped with chlorhexidine . A sterile gown and gloves were worn during the procedure. Local anesthesia was provided with 1% lidocaine  with epinephrine . A timeout was performed prior to the initiation of the procedure. An incision was made overlying the Port-A-Cath with a #15 scalpel. Utilizing sharp and blunt dissection, the Port-A-Cath was removed completely. Port a catheter tip was submitted for culture analysis. The port pocket was visually inspected and negative for obvious signs of infection. As such, the decision was made to close the port incision by primary intention. The  pocked was irrigated with sterile saline. Wound closure was performed with interrupted subcutaneous 2-0 Vicryl sutures, a running subcuticular 4-0 Vicryl, Dermabond and Steri-Strips. Dressings were applied. The patient tolerated the procedure well without immediate post procedural complication. FINDINGS: Successful removal of implant Port-A-Cath without immediate post procedural complication. IMPRESSION: Successful removal of implanted Port-A-Cath. Port a Catheter tip submitted for culture analysis. Electronically Signed   By: Norleen Roulette M.D.   On: 06/12/2023 16:53   CT ABDOMEN PELVIS W CONTRAST Result Date: 06/11/2023 CLINICAL DATA:  Abdominal pain and swelling. History of metastatic hepatocellular carcinoma. EXAM: CT ABDOMEN AND PELVIS WITH CONTRAST TECHNIQUE: Multidetector CT imaging of the abdomen and pelvis was performed using the standard protocol following bolus administration of intravenous contrast. RADIATION DOSE REDUCTION: This exam was performed according to the departmental dose-optimization program which includes automated exposure control, adjustment of the mA and/or kV according to patient size and/or  use of iterative reconstruction technique. CONTRAST:  OMNIPAQUE  IOHEXOL  300 MG/ML  SOLN COMPARISON:  CT of the abdomen pelvis dated 04/21/2023. FINDINGS: Lower chest: Bibasilar linear atelectasis/scarring. Retained metallic pellets noted in the left lung base. Partially visualized central venous line with tip in the right atrium. Calcified hilar and mediastinal granuloma. No intra-abdominal free air.  Trace perihepatic free fluid. Hepatobiliary: Cirrhosis. Interval increase in the size of the mass centered in the left lobe of the liver measuring approximately 6 x 9 cm (previously 5 x 9 cm). There is dilatation of the common bile duct and central biliary tree. No calcified gallstone. Pancreas: No active inflammatory changes. No dilatation of the main pancreatic duct. Spleen: Normal in size  without focal abnormality. Adrenals/Urinary Tract: The adrenal glands are poorly visualized. Mild fullness of the left renal collecting system likely due to compression of the left ureter by the retroperitoneal mass. There is symmetric enhancement and excretion of contrast by both kidneys. The urinary bladder is grossly unremarkable. Stomach/Bowel: There is similar appearance of thickening of the wall of the distal stomach. There is no bowel obstruction. The appendix is normal. Vascular/Lymphatic: Moderate aortoiliac atherosclerotic disease. The origins of the celiac axis, SMA, and IMA appear patent. The renal arteries are patent. The IVC is unremarkable. There is high-grade narrowing with near complete occlusion of the main portal vein due to compression by surrounding mass/adenopathy. There is cavernous transformation and upper abdominal varices. Interval increase in the retroperitoneal adenopathy since the prior CT. A conglomerate of retroperitoneal lymph node to the left of the aorta measures 7.8 x 6.1 cm in greatest axial dimensions (previously approximately 7 x 5 cm). There is invasion into the left psoas muscle. A retrocaval adenopathy measures approximately 2.5 x 3.0 cm (previously 2.1 x 2.1 cm). There is associated mass effect and compression of the IVC. Interval progression of upper abdominal and gastrohepatic adenopathy since the prior CT. A representative mass/adenopathy measures approximately 5 x 4 cm (previously 3 x 4 cm). Reproductive: The prostate and seminal vesicles are grossly unremarkable. Other: Interval increase in the size of metastatic implants in the inferior right rectus sheath and along the right pelvic sidewall since the prior CT. Musculoskeletal: Osteopenia.  No acute osseous pathology. IMPRESSION: 1. Progression of disease with increase in the size of liver mass, and progression of nodal metastasis. Increase in the size of the metastatic implants in the inferior right rectus sheath and  right pelvic sidewall. 2. Cirrhosis. 3. 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 4. No bowel obstruction.  Normal appendix. 5.  Aortic Atherosclerosis (ICD10-I70.0). Electronically Signed   By: Vanetta Chou M.D.   On: 06/11/2023 18:35   DG Chest Port 1 View Result Date: 06/11/2023 CLINICAL DATA:  Questionable sepsis. EXAM: PORTABLE CHEST 1 VIEW COMPARISON:  04/21/2023 FINDINGS: Right Port-A-Cath tip at low SVC. Midline trachea. Normal heart size. Numerous leads and wires project over the chest. No pleural effusion or pneumothorax. Radiopaque foreign objects project over the inferior left hemithorax. Calcified granuloma in the right lung base. Mild right hemidiaphragm elevation. Left base scarring. Left apical scarring. IMPRESSION: No evidence of pneumonia or explanation for sepsis. Electronically Signed   By: Rockey Kilts M.D.   On: 06/11/2023 17:32    Microbiology: Results for orders placed or performed during the hospital encounter of 06/11/23  Resp panel by RT-PCR (RSV, Flu A&B, Covid) Anterior Nasal Swab     Status: None   Collection Time: 06/11/23  4:52  PM   Specimen: Anterior Nasal Swab  Result Value Ref Range Status   SARS Coronavirus 2 by RT PCR NEGATIVE NEGATIVE Final    Comment: (NOTE) SARS-CoV-2 target nucleic acids are NOT DETECTED.  The SARS-CoV-2 RNA is generally detectable in upper respiratory specimens during the acute phase of infection. The lowest concentration of SARS-CoV-2 viral copies this assay can detect is 138 copies/mL. A negative result does not preclude SARS-Cov-2 infection and should not be used as the sole basis for treatment or other patient management decisions. A negative result may occur with  improper specimen collection/handling, submission of specimen other than nasopharyngeal swab, presence of viral mutation(s) within the areas targeted by this assay, and inadequate number of viral copies(<138  copies/mL). A negative result must be combined with clinical observations, patient history, and epidemiological information. The expected result is Negative.  Fact Sheet for Patients:  bloggercourse.com  Fact Sheet for Healthcare Providers:  seriousbroker.it  This test is no t yet approved or cleared by the [place]  FDA and  has been authorized for detection and/or diagnosis of SARS-CoV-2 by FDA under an Emergency Use Authorization (EUA). This EUA will remain  in effect (meaning this test can be used) for the duration of the COVID-19 declaration under Section 564(b)(1) of the Act, 21 U.S.C.section 360bbb-3(b)(1), unless the authorization is terminated  or revoked sooner.       Influenza A by PCR NEGATIVE NEGATIVE Final   Influenza B by PCR NEGATIVE NEGATIVE Final    Comment: (NOTE) The Xpert Xpress SARS-CoV-2/FLU/RSV plus assay is intended as an aid in the diagnosis of influenza from Nasopharyngeal swab specimens and should not be used as a sole basis for treatment. Nasal washings and aspirates are unacceptable for Xpert Xpress SARS-CoV-2/FLU/RSV testing.  Fact Sheet for Patients: bloggercourse.com  Fact Sheet for Healthcare Providers: seriousbroker.it  This test is not yet approved or cleared by the [place]  FDA and has been authorized for detection and/or diagnosis of SARS-CoV-2 by FDA under an Emergency Use Authorization (EUA). This EUA will remain in effect (meaning this test can be used) for the duration of the COVID-19 declaration under Section 564(b)(1) of the Act, 21 U.S.C. section 360bbb-3(b)(1), unless the authorization is terminated or revoked.     Resp Syncytial Virus by PCR NEGATIVE NEGATIVE Final    Comment: (NOTE) Fact Sheet for Patients: bloggercourse.com  Fact Sheet for Healthcare  Providers: seriousbroker.it  This test is not yet approved or cleared by the [place]  FDA and has been authorized for detection and/or diagnosis of SARS-CoV-2 by FDA under an Emergency Use Authorization (EUA). This EUA will remain in effect (meaning this test can be used) for the duration of the COVID-19 declaration under Section 564(b)(1) of the Act, 21 U.S.C. section 360bbb-3(b)(1), unless the authorization is terminated or revoked.  Performed at [facility], [address] [place], [place] [address]   Blood Culture (routine x 2)     Status: Abnormal   Collection Time: 06/11/23  5:08 PM   Specimen: BLOOD  Result Value Ref Range Status   Specimen Description   Final    BLOOD RIGHT ANTECUBITAL Performed at [facility], [address] [place], [place] [address]    Special Requests   Final    BOTTLES DRAWN AEROBIC AND ANAEROBIC Blood Culture adequate volume Performed at [facility], [address], [place], [place] [address]    Culture  Setup Time   Final    GRAM NEGATIVE  RODS IN BOTH AEROBIC AND ANAEROBIC BOTTLES CRITICAL RESULT CALLED TO, READ BACK BY AND VERIFIED WITH: PHARMD A.ARLINGTON AT 9073 ON 06/12/2023 BY T.SAAD. Performed at [facility], [address], [place], [place] [address]    Culture KLEBSIELLA ORNITHINOLYTICA (A)  Final   Report Status 06/14/2023 FINAL  Final   Organism ID, Bacteria KLEBSIELLA ORNITHINOLYTICA  Final      Susceptibility   Klebsiella ornithinolytica - MIC*    AMPICILLIN  RESISTANT Resistant     CEFEPIME  <=0.12 SENSITIVE Sensitive     CEFTAZIDIME <=1 SENSITIVE Sensitive     CEFTRIAXONE  <=0.25 SENSITIVE Sensitive     CIPROFLOXACIN <=0.25 SENSITIVE Sensitive     GENTAMICIN <=1 SENSITIVE Sensitive     IMIPENEM <=0.25 SENSITIVE Sensitive     TRIMETH/SULFA <=20 SENSITIVE Sensitive     AMPICILLIN /SULBACTAM <=2 SENSITIVE Sensitive     PIP/TAZO <=4  SENSITIVE Sensitive ug/mL    * KLEBSIELLA ORNITHINOLYTICA  Blood Culture (routine x 2)     Status: Abnormal   Collection Time: 06/11/23  5:08 PM   Specimen: BLOOD  Result Value Ref Range Status   Specimen Description   Final    BLOOD LEFT ANTECUBITAL Performed at [facility], [address], [place], [place] [address]    Special Requests   Final    BOTTLES DRAWN AEROBIC AND ANAEROBIC Blood Culture adequate volume Performed at [facility], [address], [place], [place] [address]    Culture  Setup Time   Final    GRAM NEGATIVE RODS IN BOTH AEROBIC AND ANAEROBIC BOTTLES CRITICAL VALUE NOTED.  VALUE IS CONSISTENT WITH PREVIOUSLY REPORTED AND CALLED VALUE. GRAM POSITIVE COCCI AEROBIC BOTTLE ONLY CRITICAL RESULT CALLED TO, READ BACK BY AND VERIFIED WITH: PHARMD A ALLINGTON [id number] AT 1125 BY CM    Culture (A)  Final    KLEBSIELLA ORNITHINOLYTICA SUSCEPTIBILITIES PERFORMED ON PREVIOUS CULTURE WITHIN THE LAST 5 DAYS. STAPHYLOCOCCUS HAEMOLYTICUS THE SIGNIFICANCE OF ISOLATING THIS ORGANISM FROM A SINGLE SET OF BLOOD CULTURES WHEN MULTIPLE SETS ARE DRAWN IS UNCERTAIN. PLEASE NOTIFY THE MICROBIOLOGY DEPARTMENT WITHIN ONE WEEK IF SPECIATION AND SENSITIVITIES ARE REQUIRED. Performed at [facility], [address] [place], [place] [address]    Report Status 06/15/2023 FINAL  Final  Blood Culture ID Panel (Reflexed)     Status: Abnormal   Collection Time: 06/11/23  5:08 PM  Result Value Ref Range Status   Enterococcus faecalis NOT DETECTED NOT DETECTED Final   Enterococcus Faecium NOT DETECTED NOT DETECTED Final   Listeria monocytogenes NOT DETECTED NOT DETECTED Final   Staphylococcus species NOT DETECTED NOT DETECTED Final   Staphylococcus aureus (BCID) NOT DETECTED NOT DETECTED Final   Staphylococcus epidermidis NOT DETECTED NOT DETECTED Final   Staphylococcus lugdunensis NOT DETECTED NOT DETECTED Final   Streptococcus species NOT DETECTED  NOT DETECTED Final   Streptococcus agalactiae NOT DETECTED NOT DETECTED Final   Streptococcus pneumoniae NOT DETECTED NOT DETECTED Final   Streptococcus pyogenes NOT DETECTED NOT DETECTED Final   A.calcoaceticus-baumannii NOT DETECTED NOT DETECTED Final   Bacteroides fragilis NOT DETECTED NOT DETECTED Final   Enterobacterales DETECTED (A) NOT DETECTED Final    Comment: Enterobacterales represent a large order of gram negative bacteria, not a single organism. Refer to culture for further identification. CRITICAL RESULT CALLED TO, READ BACK BY AND VERIFIED WITH: PHARMD A.ARLINGTON AT 9073 ON 06/12/2023 BY T.SAAD.    Enterobacter cloacae complex NOT DETECTED NOT DETECTED Final   Escherichia coli NOT DETECTED  NOT DETECTED Final   Klebsiella aerogenes NOT DETECTED NOT DETECTED Final   Klebsiella oxytoca NOT DETECTED NOT DETECTED Final   Klebsiella pneumoniae NOT DETECTED NOT DETECTED Final   Proteus species NOT DETECTED NOT DETECTED Final   Salmonella species NOT DETECTED NOT DETECTED Final   Serratia marcescens NOT DETECTED NOT DETECTED Final   Haemophilus influenzae NOT DETECTED NOT DETECTED Final   Neisseria meningitidis NOT DETECTED NOT DETECTED Final   Pseudomonas aeruginosa NOT DETECTED NOT DETECTED Final   Stenotrophomonas maltophilia NOT DETECTED NOT DETECTED Final   Candida albicans NOT DETECTED NOT DETECTED Final   Candida auris NOT DETECTED NOT DETECTED Final   Candida glabrata NOT DETECTED NOT DETECTED Final   Candida krusei NOT DETECTED NOT DETECTED Final   Candida parapsilosis NOT DETECTED NOT DETECTED Final   Candida tropicalis NOT DETECTED NOT DETECTED Final   Cryptococcus neoformans/gattii NOT DETECTED NOT DETECTED Final   CTX-M ESBL NOT DETECTED NOT DETECTED Final   Carbapenem resistance IMP NOT DETECTED NOT DETECTED Final   Carbapenem resistance KPC NOT DETECTED NOT DETECTED Final   Carbapenem resistance NDM NOT DETECTED NOT DETECTED Final   Carbapenem resist OXA 48  LIKE NOT DETECTED NOT DETECTED Final   Carbapenem resistance VIM NOT DETECTED NOT DETECTED Final    Comment: Performed at [facility], [address] [place], [place] [address]  Blood Culture ID Panel (Reflexed)     Status: Abnormal   Collection Time: 06/11/23  5:08 PM  Result Value Ref Range Status   Enterococcus faecalis NOT DETECTED NOT DETECTED Final   Enterococcus Faecium NOT DETECTED NOT DETECTED Final   Listeria monocytogenes NOT DETECTED NOT DETECTED Final   Staphylococcus species DETECTED (A) NOT DETECTED Final    Comment: CRITICAL RESULT CALLED TO, READ BACK BY AND VERIFIED WITH: PHARMD A ALLINGTON [id number] AT 1125 BY CM    Staphylococcus aureus (BCID) NOT DETECTED NOT DETECTED Final   Staphylococcus epidermidis NOT DETECTED NOT DETECTED Final   Staphylococcus lugdunensis NOT DETECTED NOT DETECTED Final   Streptococcus species NOT DETECTED NOT DETECTED Final   Streptococcus agalactiae NOT DETECTED NOT DETECTED Final   Streptococcus pneumoniae NOT DETECTED NOT DETECTED Final   Streptococcus pyogenes NOT DETECTED NOT DETECTED Final   A.calcoaceticus-baumannii NOT DETECTED NOT DETECTED Final   Bacteroides fragilis NOT DETECTED NOT DETECTED Final   Enterobacterales DETECTED (A) NOT DETECTED Final    Comment: Enterobacterales represent a large order of gram negative bacteria, not a single organism. Refer to culture for further identification. CRITICAL RESULT CALLED TO, READ BACK BY AND VERIFIED WITH: PHARMD A ALLINGTON [id number] AT 1125 BY CM    Enterobacter cloacae complex NOT DETECTED NOT DETECTED Final   Escherichia coli NOT DETECTED NOT DETECTED Final   Klebsiella aerogenes NOT DETECTED NOT DETECTED Final   Klebsiella oxytoca NOT DETECTED NOT DETECTED Final   Klebsiella pneumoniae NOT DETECTED NOT DETECTED Final   Proteus species NOT DETECTED NOT DETECTED Final   Salmonella species NOT DETECTED NOT DETECTED Final   Serratia marcescens NOT DETECTED NOT DETECTED Final    Haemophilus influenzae NOT DETECTED NOT DETECTED Final   Neisseria meningitidis NOT DETECTED NOT DETECTED Final   Pseudomonas aeruginosa NOT DETECTED NOT DETECTED Final   Stenotrophomonas maltophilia NOT DETECTED NOT DETECTED Final   Candida albicans NOT DETECTED NOT DETECTED Final   Candida auris NOT DETECTED NOT DETECTED Final   Candida glabrata NOT DETECTED NOT DETECTED Final   Candida krusei NOT DETECTED NOT DETECTED Final   Candida  parapsilosis NOT DETECTED NOT DETECTED Final   Candida tropicalis NOT DETECTED NOT DETECTED Final   Cryptococcus neoformans/gattii NOT DETECTED NOT DETECTED Final   CTX-M ESBL NOT DETECTED NOT DETECTED Final   Carbapenem resistance IMP NOT DETECTED NOT DETECTED Final   Carbapenem resistance KPC NOT DETECTED NOT DETECTED Final   Carbapenem resistance NDM NOT DETECTED NOT DETECTED Final   Carbapenem resist OXA 48 LIKE NOT DETECTED NOT DETECTED Final   Carbapenem resistance VIM NOT DETECTED NOT DETECTED Final    Comment: Performed at [facility], [address] [place], [place] [address]  Blood culture (routine x 2)     Status: Abnormal   Collection Time: 06/11/23  8:45 PM   Specimen: BLOOD RIGHT HAND  Result Value Ref Range Status   Specimen Description   Final    BLOOD RIGHT HAND Performed at [facility], [address], [place], [place] [address]    Special Requests   Final    BOTTLES DRAWN AEROBIC AND ANAEROBIC Blood Culture results may not be optimal due to an inadequate volume of blood received in culture bottles Performed at [facility], [address], [place], [place] [address]    Culture  Setup Time   Final    GRAM NEGATIVE RODS ANAEROBIC BOTTLE ONLY CRITICAL VALUE NOTED.  VALUE IS CONSISTENT WITH PREVIOUSLY REPORTED AND CALLED VALUE.    Culture (A)  Final    KLEBSIELLA ORNITHINOLYTICA SUSCEPTIBILITIES PERFORMED ON PREVIOUS CULTURE WITHIN THE LAST 5 DAYS. STENOTROPHOMONAS MALTOPHILIA CRITICAL RESULT  CALLED TO, READ BACK BY AND VERIFIED WITH: PHARMD CHRISTELLA MILLET [id number] AT 920 BY CM Performed at [facility], [address], [place], [place] [address]    Report Status 06/17/2023 FINAL  Final   Organism ID, Bacteria STENOTROPHOMONAS MALTOPHILIA  Final      Susceptibility   Stenotrophomonas maltophilia - MIC*    LEVOFLOXACIN 0.25 SENSITIVE Sensitive     TRIMETH/SULFA <=20 SENSITIVE Sensitive     * STENOTROPHOMONAS MALTOPHILIA  Blood culture (routine x 2)     Status: Abnormal   Collection Time: 06/11/23  9:00 PM   Specimen: BLOOD  Result Value Ref Range Status   Specimen Description   Final    BLOOD LEFT ANTECUBITAL Performed at [facility], [address] [place], [place] [address]    Special Requests   Final    BOTTLES DRAWN AEROBIC AND ANAEROBIC Blood Culture results may not be optimal due to an inadequate volume of blood received in culture bottles Performed at [facility], [address] [place], [place] [address]    Culture  Setup Time   Final    GRAM NEGATIVE RODS AEROBIC BOTTLE ONLY CRITICAL VALUE NOTED.  VALUE IS CONSISTENT WITH PREVIOUSLY REPORTED AND CALLED VALUE.    Culture (A)  Final    KLEBSIELLA ORNITHINOLYTICA SUSCEPTIBILITIES PERFORMED ON PREVIOUS CULTURE WITHIN THE LAST 5 DAYS. Performed at [facility], [address] [place], [place] [address]    Report Status 06/14/2023 FINAL  Final  Cath Tip Culture     Status: Abnormal   Collection Time: 06/12/23  5:14 PM   Specimen: Catheter Tip  Result Value Ref Range Status   Specimen Description   Final    CATH TIP Performed at [facility], [address], [place], [place] [address]    Special Requests   Final    NONE Performed at [facility], [address] [place], [place] [address]    Culture RAOULTELLA PLANTICOLA (A)  Final   Report Status 06/14/2023 FINAL  Final   Organism ID, Bacteria RAOULTELLA PLANTICOLA   Final      Susceptibility   Raoultella planticola - MIC*    AMPICILLIN  >=32 RESISTANT Resistant     CEFEPIME  <=0.12 SENSITIVE Sensitive     CEFTAZIDIME <=1 SENSITIVE Sensitive     CEFTRIAXONE  <=0.25 SENSITIVE Sensitive     CIPROFLOXACIN <=0.25 SENSITIVE Sensitive     GENTAMICIN <=1 SENSITIVE Sensitive     IMIPENEM 0.5 SENSITIVE Sensitive     TRIMETH/SULFA <=20 SENSITIVE Sensitive     AMPICILLIN /SULBACTAM 4 SENSITIVE Sensitive     PIP/TAZO 8 SENSITIVE Sensitive ug/mL    * RAOULTELLA PLANTICOLA  Culture, blood (Routine X 2) w Reflex to ID Panel     Status: Abnormal (Preliminary result)   Collection Time: 06/16/23  2:16 PM   Specimen: BLOOD RIGHT ARM  Result Value Ref Range Status   Specimen Description   Final    BLOOD RIGHT ARM Performed at [facility], [address], [place], [place] [address]    Special Requests   Final    BOTTLES DRAWN AEROBIC AND ANAEROBIC Blood Culture results may not be optimal due to an inadequate volume of blood received in culture bottles Performed at [facility], [address] [place], [place] [address]    Culture  Setup Time   Final    BUDDING YEAST SEEN AEROBIC BOTTLE ONLY CRITICAL RESULT CALLED TO, READ BACK BY AND VERIFIED WITH: PHARMD E JACKSON 06/18/2023 @ 0255 BY AB    Culture (A)  Final    CANDIDA LUSITANIAE SENT TO [facility] FOR SUSCEPTIBILITY TESTING Performed at [facility], [address], [place], [place] [address]    Report Status PENDING  Incomplete  Blood Culture ID Panel (Reflexed)     Status: None   Collection Time: 06/16/23  2:16 PM  Result Value Ref Range Status   Enterococcus faecalis NOT DETECTED NOT DETECTED Final   Enterococcus Faecium NOT DETECTED NOT DETECTED Final   Listeria monocytogenes NOT DETECTED NOT DETECTED Final   Staphylococcus species NOT DETECTED NOT DETECTED Final   Staphylococcus aureus (BCID) NOT DETECTED NOT DETECTED Final   Staphylococcus epidermidis NOT  DETECTED NOT DETECTED Final   Staphylococcus lugdunensis NOT DETECTED NOT DETECTED Final   Streptococcus species NOT DETECTED NOT DETECTED Final   Streptococcus agalactiae NOT DETECTED NOT DETECTED Final   Streptococcus pneumoniae NOT DETECTED NOT DETECTED Final   Streptococcus pyogenes NOT DETECTED NOT DETECTED Final   A.calcoaceticus-baumannii NOT DETECTED NOT DETECTED Final   Bacteroides fragilis NOT DETECTED NOT DETECTED Final   Enterobacterales NOT DETECTED NOT DETECTED Final   Enterobacter cloacae complex NOT DETECTED NOT DETECTED Final   Escherichia coli NOT DETECTED NOT DETECTED Final   Klebsiella aerogenes NOT DETECTED NOT DETECTED Final   Klebsiella oxytoca NOT DETECTED NOT DETECTED Final   Klebsiella pneumoniae NOT DETECTED NOT DETECTED Final   Proteus species NOT DETECTED NOT DETECTED Final   Salmonella species NOT DETECTED NOT DETECTED Final   Serratia marcescens NOT DETECTED NOT DETECTED Final   Haemophilus influenzae NOT DETECTED NOT DETECTED Final   Neisseria meningitidis NOT DETECTED NOT DETECTED Final   Pseudomonas aeruginosa NOT DETECTED NOT DETECTED Final   Stenotrophomonas maltophilia NOT DETECTED NOT DETECTED Final   Candida albicans NOT DETECTED NOT DETECTED Final   Candida auris NOT DETECTED NOT DETECTED Final  Candida glabrata NOT DETECTED NOT DETECTED Final   Candida krusei NOT DETECTED NOT DETECTED Final   Candida parapsilosis NOT DETECTED NOT DETECTED Final   Candida tropicalis NOT DETECTED NOT DETECTED Final   Cryptococcus neoformans/gattii NOT DETECTED NOT DETECTED Final    Comment: Performed at [facility], [address], [place], [place] [address]  Culture, blood (Routine X 2) w Reflex to ID Panel     Status: None (Preliminary result)   Collection Time: 06/16/23  2:21 PM   Specimen: BLOOD RIGHT ARM  Result Value Ref Range Status   Specimen Description   Final    BLOOD RIGHT ARM Performed at [facility], [address],  [place], [place] [address]    Special Requests   Final    BOTTLES DRAWN AEROBIC AND ANAEROBIC Blood Culture results may not be optimal due to an inadequate volume of blood received in culture bottles Performed at [facility], [address] [place], [place] [address]    Culture   Final    NO GROWTH 4 DAYS Performed at [facility], [address] [place], [place] [address]    Report Status PENDING  Incomplete  Culture, blood (Routine X 2) w Reflex to ID Panel     Status: None (Preliminary result)   Collection Time: 06/18/23  5:03 AM   Specimen: BLOOD  Result Value Ref Range Status   Specimen Description   Final    BLOOD BLOOD LEFT ARM Performed at [facility], [address], [place], [place] [address]    Special Requests   Final    BOTTLES DRAWN AEROBIC AND ANAEROBIC Blood Culture results may not be optimal due to an inadequate volume of blood received in culture bottles Performed at [facility], [address] [place], [place] [address]    Culture   Final    NO GROWTH 2 DAYS Performed at [facility], [address], [place], [place] [address]    Report Status PENDING  Incomplete  Culture, blood (Routine X 2) w Reflex to ID Panel     Status: None (Preliminary result)   Collection Time: 06/18/23  5:03 AM   Specimen: BLOOD  Result Value Ref Range Status   Specimen Description   Final    BLOOD BLOOD LEFT HAND Performed at [facility], [address] [place], [place] [address]    Special Requests   Final    BOTTLES DRAWN AEROBIC AND ANAEROBIC Blood Culture results may not be optimal due to an inadequate volume of blood received in culture bottles Performed at [facility], [address] [place], [place] [address]    Culture   Final    NO GROWTH 2 DAYS Performed at [facility], [address], [place], [place] [address]    Report Status PENDING  Incomplete     Labs: CBC: Recent Labs  Lab 06/14/23 0439 06/15/23 0422 06/16/23 0438 06/17/23 0437  WBC 7.9 5.9 4.6 4.7  HGB 8.7* 8.6* 8.1* 8.4*  HCT 26.2* 26.0* 24.9* 25.9*  MCV 91.6 91.9 91.5 93.2  PLT 92* 96* 98* 107*   Basic Metabolic Panel: Recent Labs  Lab 06/14/23 0439 06/15/23 0422 06/16/23 0438 06/17/23 0437  NA 131* 134* 135 136  K 3.6 3.4* 3.3* 3.6  CL 102 104 102 105  CO2 23 23 23 23   GLUCOSE 169* 178* 172* 194*  BUN 14 12 11  9  CREATININE 0.76 0.90 0.58* 0.72  CALCIUM 8.6* 8.6* 8.5* 8.2*   Liver Function Tests: No results for input(s): AST, ALT, ALKPHOS, BILITOT, PROT, ALBUMIN  in the last 168 hours. CBG: Recent Labs  Lab 06/19/23 0732 06/19/23 1143 06/19/23 1646 06/19/23 2036 06/20/23 0723  GLUCAP 104* 147* 150* 85 132*    Discharge time spent: less than 30 minutes.  Signed: [name], MD [facility] 06/20/2023

## 2023-06-21 LAB — CULTURE, BLOOD (ROUTINE X 2): Culture: NO GROWTH

## 2023-06-22 ENCOUNTER — Other Ambulatory Visit (HOSPITAL_COMMUNITY): Payer: Self-pay

## 2023-06-22 ENCOUNTER — Telehealth: Payer: Self-pay

## 2023-06-22 ENCOUNTER — Other Ambulatory Visit: Payer: Self-pay

## 2023-06-22 ENCOUNTER — Other Ambulatory Visit: Payer: Self-pay | Admitting: Nurse Practitioner

## 2023-06-22 ENCOUNTER — Encounter: Payer: Self-pay | Admitting: Hematology

## 2023-06-22 ENCOUNTER — Ambulatory Visit: Payer: No Typology Code available for payment source | Admitting: Radiation Oncology

## 2023-06-22 DIAGNOSIS — G893 Neoplasm related pain (acute) (chronic): Secondary | ICD-10-CM

## 2023-06-22 DIAGNOSIS — Z515 Encounter for palliative care: Secondary | ICD-10-CM

## 2023-06-22 DIAGNOSIS — C220 Liver cell carcinoma: Secondary | ICD-10-CM

## 2023-06-22 LAB — GLUCOSE, CAPILLARY: Glucose-Capillary: 128 mg/dL — ABNORMAL HIGH (ref 70–99)

## 2023-06-22 MED ORDER — HYDROCODONE-ACETAMINOPHEN 5-325 MG PO TABS
1.0000 | ORAL_TABLET | Freq: Four times a day (QID) | ORAL | 0 refills | Status: DC | PRN
  Filled 2023-06-22: qty 60, 15d supply, fill #0

## 2023-06-22 MED ORDER — NYSTATIN 100000 UNIT/ML MT SUSP
5.0000 mL | Freq: Three times a day (TID) | OROMUCOSAL | 1 refills | Status: DC | PRN
  Filled 2023-06-22: qty 140, 10d supply, fill #0

## 2023-06-22 NOTE — Telephone Encounter (Signed)
 Pt's wife "Troy [name]" called stating that pt was d/c from hospital on Saturday.  Pt was prescribed Hydrocodone  per conversation with Dr. Betsey Brow the attending at time of d/c from hospital.  [name] stated that Dr. Betsey Brow was supposed to send a prescription for Hydrocodone -Acetaminophen  to [facility] but prescription was never sent.  Pt stated she's tried contacting him several times and the nurse stated he would send in the prescription but the prescription has not been sent.  [name] is requesting if Dr. Maryalice Smaller or Bertis Brochure, NP "Landa Pine" would please send in a prescription for Hydrocodone -Acetaminophen  for this pt since the Morphine  does not work as well as the Hydrocodone  for the pt's pain.  [name] stated the pt's radiation appt was cancelled today d[facility]t [facility] at [facility].  Stated the request was sent to [facility] last week for approval and awaiting the [facility]'s response to the request.  [name] stated the [facility] is slow about responding.  [name] also stated the pt has white patches throughout his mouth which is making it difficult for the pt to drink or eat.  [name] is requesting if something could be given for thrush.  Notified Dr. Maryalice Smaller and Landa Pine of the pt's wife's request.

## 2023-06-23 ENCOUNTER — Encounter: Payer: Self-pay | Admitting: Hematology

## 2023-06-23 ENCOUNTER — Other Ambulatory Visit: Payer: Self-pay

## 2023-06-23 ENCOUNTER — Other Ambulatory Visit (HOSPITAL_COMMUNITY): Payer: Self-pay

## 2023-06-23 LAB — YEAST SUSCEPTIBILITIES
Amphotericin B MIC: 0.5
Anidulafungin MIC: 0.12
Caspofungin MIC: 0.5
Fluconazole Islt MIC: 0.5
ISAVUCONAZOLE MIC: 0.03
Itraconazole MIC: 0.12
Micafungin MIC: 0.06
Posaconazole MIC: 0.03
REZAFUNGIN MIC: 0.12
Voriconazole MIC: 0.015

## 2023-06-23 LAB — CULTURE, BLOOD (ROUTINE X 2)
Culture: NO GROWTH
Culture: NO GROWTH

## 2023-06-24 ENCOUNTER — Ambulatory Visit: Payer: No Typology Code available for payment source | Admitting: Radiation Oncology

## 2023-06-25 ENCOUNTER — Ambulatory Visit: Payer: No Typology Code available for payment source | Admitting: Radiation Oncology

## 2023-06-29 ENCOUNTER — Ambulatory Visit: Payer: No Typology Code available for payment source | Admitting: Radiation Oncology

## 2023-06-29 ENCOUNTER — Other Ambulatory Visit (HOSPITAL_COMMUNITY): Payer: Self-pay

## 2023-06-30 ENCOUNTER — Ambulatory Visit: Payer: No Typology Code available for payment source

## 2023-06-30 LAB — CULTURE, BLOOD (ROUTINE X 2)

## 2023-07-01 ENCOUNTER — Ambulatory Visit: Payer: No Typology Code available for payment source

## 2023-07-01 ENCOUNTER — Ambulatory Visit
Admission: RE | Admit: 2023-07-01 | Discharge: 2023-07-01 | Disposition: A | Discharge status: Home / Self Care | Payer: No Typology Code available for payment source | Source: Ambulatory Visit | Attending: Radiation Oncology | Admitting: Radiation Oncology

## 2023-07-01 DIAGNOSIS — Z510 Encounter for antineoplastic radiation therapy: Secondary | ICD-10-CM | POA: Diagnosis present

## 2023-07-01 DIAGNOSIS — C220 Liver cell carcinoma: Secondary | ICD-10-CM | POA: Insufficient documentation

## 2023-07-02 ENCOUNTER — Ambulatory Visit: Payer: No Typology Code available for payment source

## 2023-07-02 NOTE — Assessment & Plan Note (Signed)
 cT3N1M0, stage IVA, G3 -history of cirrhosis since ~2011 and hepatitis C diagnosed and treated in 2018 -diagnosed by liver biopsy on 11/04/21, baseline AFP normal  -s/p palliative radiation under Dr. Mitzi Hansen, 7/20-12/17/21 -he began Tecentriq and bevacizumab on 12/06/21. Tolerating well overall. 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 Milford Cage. -Restaging CT scan from April 21, 2022 showed excellent partial response to treatment, reviewed with patient and his wife today. -treatment held in Dec 2023 due to hospital admission for N/V and poor oral intake. Restarted on 05/14/2022 -Restaging CT abdomen pelvis from August 25, 2022 showed mixed response to treatment. Restaging CT on 11/13/2021 showed further disease progression -I changed his treatment to [facility], unfortunately he tolerated poorly and ended up in the hospital with hepatic encephalopathy after first week of Lenvima, his liver function also got worse.  He did recover well after he came off Lenvima -He is on surveillance now. -restaging CT scan from January 27, 2023, which showed stable liver lesions but progression of abdominal lymphadenopathy  -admitted to hospital for bacteriemia on 04/21/2023, CT scan showed metastatic cancer progression

## 2023-07-03 ENCOUNTER — Ambulatory Visit: Payer: No Typology Code available for payment source

## 2023-07-06 ENCOUNTER — Encounter: Payer: Self-pay | Admitting: Nurse Practitioner

## 2023-07-06 ENCOUNTER — Inpatient Hospital Stay: Payer: No Typology Code available for payment source

## 2023-07-06 ENCOUNTER — Inpatient Hospital Stay: Payer: No Typology Code available for payment source | Attending: Hematology | Admitting: Hematology

## 2023-07-06 ENCOUNTER — Ambulatory Visit: Payer: No Typology Code available for payment source

## 2023-07-06 ENCOUNTER — Inpatient Hospital Stay (HOSPITAL_BASED_OUTPATIENT_CLINIC_OR_DEPARTMENT_OTHER): Payer: No Typology Code available for payment source | Admitting: Nurse Practitioner

## 2023-07-06 VITALS — BP 138/78 | HR 117 | Temp 98.7°F | Resp 17 | Wt 127.1 lb

## 2023-07-06 DIAGNOSIS — R634 Abnormal weight loss: Secondary | ICD-10-CM | POA: Insufficient documentation

## 2023-07-06 DIAGNOSIS — C220 Liver cell carcinoma: Secondary | ICD-10-CM | POA: Insufficient documentation

## 2023-07-06 DIAGNOSIS — R630 Anorexia: Secondary | ICD-10-CM

## 2023-07-06 DIAGNOSIS — Z515 Encounter for palliative care: Secondary | ICD-10-CM | POA: Diagnosis not present

## 2023-07-06 DIAGNOSIS — D649 Anemia, unspecified: Secondary | ICD-10-CM

## 2023-07-06 DIAGNOSIS — R530 Neoplastic (malignant) related fatigue: Secondary | ICD-10-CM

## 2023-07-06 DIAGNOSIS — B1920 Unspecified viral hepatitis C without hepatic coma: Secondary | ICD-10-CM | POA: Diagnosis not present

## 2023-07-06 DIAGNOSIS — Z79899 Other long term (current) drug therapy: Secondary | ICD-10-CM | POA: Diagnosis not present

## 2023-07-06 DIAGNOSIS — I10 Essential (primary) hypertension: Secondary | ICD-10-CM | POA: Insufficient documentation

## 2023-07-06 DIAGNOSIS — E119 Type 2 diabetes mellitus without complications: Secondary | ICD-10-CM | POA: Insufficient documentation

## 2023-07-06 DIAGNOSIS — G893 Neoplasm related pain (acute) (chronic): Secondary | ICD-10-CM

## 2023-07-06 DIAGNOSIS — R531 Weakness: Secondary | ICD-10-CM | POA: Insufficient documentation

## 2023-07-06 DIAGNOSIS — K7469 Other cirrhosis of liver: Secondary | ICD-10-CM | POA: Insufficient documentation

## 2023-07-06 DIAGNOSIS — Z87891 Personal history of nicotine dependence: Secondary | ICD-10-CM | POA: Insufficient documentation

## 2023-07-06 LAB — COMPREHENSIVE METABOLIC PANEL
ALT: 24 U/L (ref 0–44)
AST: 84 U/L — ABNORMAL HIGH (ref 15–41)
Albumin: 3 g/dL — ABNORMAL LOW (ref 3.5–5.0)
Alkaline Phosphatase: 372 U/L — ABNORMAL HIGH (ref 38–126)
Anion gap: 9 (ref 5–15)
BUN: 14 mg/dL (ref 8–23)
CO2: 27 mmol/L (ref 22–32)
Calcium: 10.4 mg/dL — ABNORMAL HIGH (ref 8.9–10.3)
Chloride: 99 mmol/L (ref 98–111)
Creatinine, Ser: 1.12 mg/dL (ref 0.61–1.24)
GFR, Estimated: >60 mL/min (ref >60)
Glucose, Bld: 182 mg/dL — ABNORMAL HIGH (ref 70–99)
Potassium: 3.9 mmol/L (ref 3.5–5.1)
Sodium: 135 mmol/L (ref 135–145)
Total Bilirubin: 1.1 mg/dL (ref 0.0–1.2)
Total Protein: 9.4 g/dL — ABNORMAL HIGH (ref 6.5–8.1)

## 2023-07-06 LAB — CBC WITH DIFFERENTIAL/PLATELET
Abs Immature Granulocytes: 0.05 10*3/uL (ref 0.00–0.07)
Basophils Absolute: 0 10*3/uL (ref 0.0–0.1)
Basophils Relative: 0 %
Eosinophils Absolute: 0 10*3/uL (ref 0.0–0.5)
Eosinophils Relative: 1 %
HCT: 33.2 % — ABNORMAL LOW (ref 39.0–52.0)
Hemoglobin: 10.8 g/dL — ABNORMAL LOW (ref 13.0–17.0)
Immature Granulocytes: 1 %
Lymphocytes Relative: 16 %
Lymphs Abs: 1.1 10*3/uL (ref 0.7–4.0)
MCH: 30 pg (ref 26.0–34.0)
MCHC: 32.5 g/dL (ref 30.0–36.0)
MCV: 92.2 fL (ref 80.0–100.0)
Monocytes Absolute: 0.7 10*3/uL (ref 0.1–1.0)
Monocytes Relative: 9 %
Neutro Abs: 5.2 10*3/uL (ref 1.7–7.7)
Neutrophils Relative %: 73 %
Platelets: 151 10*3/uL (ref 150–400)
RBC: 3.6 MIL/uL — ABNORMAL LOW (ref 4.22–5.81)
RDW: 15 % (ref 11.5–15.5)
WBC: 7.1 10*3/uL (ref 4.0–10.5)
nRBC: 0 % (ref 0.0–0.2)

## 2023-07-06 LAB — FERRITIN: Ferritin: 239 ng/mL (ref 24–336)
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
## 2023-07-07 ENCOUNTER — Ambulatory Visit: Payer: No Typology Code available for payment source

## 2023-07-07 DIAGNOSIS — Z510 Encounter for antineoplastic radiation therapy: Secondary | ICD-10-CM | POA: Diagnosis not present

## 2023-07-07 LAB — AFP TUMOR MARKER: AFP, Serum, Tumor Marker: 530 ng/mL — ABNORMAL HIGH (ref 0.0–8.4)

## 2023-07-07 NOTE — Telephone Encounter (Signed)
 Oral Chemotherapy Pharmacist Encounter   Patient will not be starting Nexavar (sorafenib) per MD office visit note from 07/06/23. Oral chemotherapy clinic will sign off at this time.   Sherry Ruffing, PharmD, BCPS, BCOP Hematology/Oncology Clinical Pharmacist [facility] Oral Chemotherapy Navigation Clinics [phone] 07/07/2023 8:20 AM

## 2023-07-08 ENCOUNTER — Ambulatory Visit
Admission: RE | Admit: 2023-07-08 | Discharge: 2023-07-08 | Disposition: A | Discharge status: Home / Self Care | Payer: No Typology Code available for payment source | Source: Ambulatory Visit | Attending: Radiation Oncology | Admitting: Radiation Oncology

## 2023-07-08 ENCOUNTER — Ambulatory Visit: Payer: No Typology Code available for payment source

## 2023-07-08 ENCOUNTER — Other Ambulatory Visit: Payer: Self-pay

## 2023-07-08 DIAGNOSIS — Z510 Encounter for antineoplastic radiation therapy: Secondary | ICD-10-CM | POA: Diagnosis not present

## 2023-07-08 LAB — RAD ONC ARIA SESSION SUMMARY
Course Elapsed Days: 0
Plan Fractions Treated to Date: 1
Plan Fractions Treated to Date: 1
Plan Prescribed Dose Per Fraction: 3 Gy
Plan Prescribed Dose Per Fraction: 3 Gy
Plan Total Fractions Prescribed: 10
Plan Total Fractions Prescribed: 7
Plan Total Prescribed Dose: 21 Gy
Plan Total Prescribed Dose: 30 Gy
Reference Point Dosage Given to Date: 3 Gy
Reference Point Dosage Given to Date: 3 Gy
Reference Point Session Dosage Given: 3 Gy
Reference Point Session Dosage Given: 3 Gy
Session Number: 1

## 2023-07-09 ENCOUNTER — Ambulatory Visit
Admission: RE | Admit: 2023-07-09 | Discharge: 2023-07-09 | Disposition: A | Discharge status: Home / Self Care | Payer: No Typology Code available for payment source | Source: Ambulatory Visit | Attending: Radiation Oncology | Admitting: Radiation Oncology

## 2023-07-09 ENCOUNTER — Telehealth: Payer: Self-pay

## 2023-07-09 ENCOUNTER — Other Ambulatory Visit: Payer: Self-pay

## 2023-07-09 ENCOUNTER — Ambulatory Visit: Payer: No Typology Code available for payment source

## 2023-07-09 DIAGNOSIS — R634 Abnormal weight loss: Secondary | ICD-10-CM

## 2023-07-09 DIAGNOSIS — G893 Neoplasm related pain (acute) (chronic): Secondary | ICD-10-CM

## 2023-07-09 DIAGNOSIS — R630 Anorexia: Secondary | ICD-10-CM

## 2023-07-09 DIAGNOSIS — R530 Neoplastic (malignant) related fatigue: Secondary | ICD-10-CM

## 2023-07-09 DIAGNOSIS — Z510 Encounter for antineoplastic radiation therapy: Secondary | ICD-10-CM | POA: Diagnosis not present

## 2023-07-09 DIAGNOSIS — C220 Liver cell carcinoma: Secondary | ICD-10-CM

## 2023-07-09 DIAGNOSIS — Z515 Encounter for palliative care: Secondary | ICD-10-CM

## 2023-07-09 LAB — RAD ONC ARIA SESSION SUMMARY
Course Elapsed Days: 1
Plan Fractions Treated to Date: 2
Plan Fractions Treated to Date: 2
Plan Prescribed Dose Per Fraction: 3 Gy
Plan Prescribed Dose Per Fraction: 3 Gy
Plan Total Fractions Prescribed: 10
Plan Total Fractions Prescribed: 7
Plan Total Prescribed Dose: 21 Gy
Plan Total Prescribed Dose: 30 Gy
Reference Point Dosage Given to Date: 6 Gy
Reference Point Dosage Given to Date: 6 Gy
Reference Point Session Dosage Given: 3 Gy
Reference Point Session Dosage Given: 3 Gy
Session Number: 2

## 2023-07-09 NOTE — Telephone Encounter (Signed)
 error

## 2023-07-09 NOTE — Telephone Encounter (Signed)
 Pt wife called reporting that pt is feeling fatigued, is not eating or drinking much, and is c/o N/V and constipation since undergoing radiation. RN discussed utilization of zofran and mirilax for nausea and constipation respectively and the relationship between the two, as well as increased risk of nausea when taking medication on an empty stomach. Pt wife asking for IVF, no space in infusion room today or Friday, appt made for Monday per pt and wife request. Strict ED precautions also discussed. Verbalized understanding of all education, no further needs at this time. IVF orders placed per Lowella Bandy, NP.

## 2023-07-10 ENCOUNTER — Ambulatory Visit
Admission: RE | Admit: 2023-07-10 | Discharge: 2023-07-10 | Disposition: A | Discharge status: Home / Self Care | Payer: No Typology Code available for payment source | Source: Ambulatory Visit | Attending: Radiation Oncology | Admitting: Radiation Oncology

## 2023-07-10 ENCOUNTER — Ambulatory Visit: Payer: No Typology Code available for payment source

## 2023-07-10 ENCOUNTER — Other Ambulatory Visit: Payer: Self-pay

## 2023-07-10 DIAGNOSIS — Z510 Encounter for antineoplastic radiation therapy: Secondary | ICD-10-CM | POA: Diagnosis not present

## 2023-07-10 LAB — RAD ONC ARIA SESSION SUMMARY
Course Elapsed Days: 2
Plan Fractions Treated to Date: 3
Plan Fractions Treated to Date: 3
Plan Prescribed Dose Per Fraction: 3 Gy
Plan Prescribed Dose Per Fraction: 3 Gy
Plan Total Fractions Prescribed: 10
Plan Total Fractions Prescribed: 7
Plan Total Prescribed Dose: 21 Gy
Plan Total Prescribed Dose: 30 Gy
Reference Point Dosage Given to Date: 9 Gy
Reference Point Dosage Given to Date: 9 Gy
Reference Point Session Dosage Given: 3 Gy
Reference Point Session Dosage Given: 3 Gy
Session Number: 3

## 2023-07-13 ENCOUNTER — Ambulatory Visit
Admission: RE | Admit: 2023-07-13 | Discharge: 2023-07-13 | Disposition: A | Discharge status: Home / Self Care | Payer: No Typology Code available for payment source | Source: Ambulatory Visit | Attending: Radiation Oncology | Admitting: Radiation Oncology

## 2023-07-13 ENCOUNTER — Other Ambulatory Visit: Payer: Self-pay

## 2023-07-13 ENCOUNTER — Inpatient Hospital Stay: Payer: No Typology Code available for payment source | Attending: Hematology

## 2023-07-13 DIAGNOSIS — N400 Enlarged prostate without lower urinary tract symptoms: Secondary | ICD-10-CM | POA: Diagnosis not present

## 2023-07-13 DIAGNOSIS — R630 Anorexia: Secondary | ICD-10-CM

## 2023-07-13 DIAGNOSIS — Z79899 Other long term (current) drug therapy: Secondary | ICD-10-CM | POA: Insufficient documentation

## 2023-07-13 DIAGNOSIS — K449 Diaphragmatic hernia without obstruction or gangrene: Secondary | ICD-10-CM | POA: Diagnosis not present

## 2023-07-13 DIAGNOSIS — Z515 Encounter for palliative care: Secondary | ICD-10-CM | POA: Diagnosis not present

## 2023-07-13 DIAGNOSIS — I864 Gastric varices: Secondary | ICD-10-CM | POA: Diagnosis not present

## 2023-07-13 DIAGNOSIS — K766 Portal hypertension: Secondary | ICD-10-CM | POA: Insufficient documentation

## 2023-07-13 DIAGNOSIS — R530 Neoplastic (malignant) related fatigue: Secondary | ICD-10-CM

## 2023-07-13 DIAGNOSIS — I868 Varicose veins of other specified sites: Secondary | ICD-10-CM | POA: Diagnosis not present

## 2023-07-13 DIAGNOSIS — R590 Localized enlarged lymph nodes: Secondary | ICD-10-CM | POA: Insufficient documentation

## 2023-07-13 DIAGNOSIS — K828 Other specified diseases of gallbladder: Secondary | ICD-10-CM | POA: Diagnosis not present

## 2023-07-13 DIAGNOSIS — I10 Essential (primary) hypertension: Secondary | ICD-10-CM | POA: Insufficient documentation

## 2023-07-13 DIAGNOSIS — C220 Liver cell carcinoma: Secondary | ICD-10-CM | POA: Insufficient documentation

## 2023-07-13 DIAGNOSIS — I81 Portal vein thrombosis: Secondary | ICD-10-CM | POA: Diagnosis not present

## 2023-07-13 DIAGNOSIS — Z8619 Personal history of other infectious and parasitic diseases: Secondary | ICD-10-CM | POA: Diagnosis not present

## 2023-07-13 DIAGNOSIS — E119 Type 2 diabetes mellitus without complications: Secondary | ICD-10-CM | POA: Insufficient documentation

## 2023-07-13 DIAGNOSIS — Z923 Personal history of irradiation: Secondary | ICD-10-CM | POA: Insufficient documentation

## 2023-07-13 DIAGNOSIS — K7682 Hepatic encephalopathy: Secondary | ICD-10-CM | POA: Diagnosis not present

## 2023-07-13 DIAGNOSIS — Z66 Do not resuscitate: Secondary | ICD-10-CM | POA: Diagnosis not present

## 2023-07-13 DIAGNOSIS — G893 Neoplasm related pain (acute) (chronic): Secondary | ICD-10-CM

## 2023-07-13 DIAGNOSIS — R609 Edema, unspecified: Secondary | ICD-10-CM | POA: Insufficient documentation

## 2023-07-13 DIAGNOSIS — R634 Abnormal weight loss: Secondary | ICD-10-CM

## 2023-07-13 DIAGNOSIS — Z510 Encounter for antineoplastic radiation therapy: Secondary | ICD-10-CM | POA: Diagnosis present

## 2023-07-13 DIAGNOSIS — I700 Atherosclerosis of aorta: Secondary | ICD-10-CM | POA: Diagnosis not present

## 2023-07-13 LAB — RAD ONC ARIA SESSION SUMMARY
Course Elapsed Days: 5
Plan Fractions Treated to Date: 4
Plan Fractions Treated to Date: 4
Plan Prescribed Dose Per Fraction: 3 Gy
Plan Prescribed Dose Per Fraction: 3 Gy
Plan Total Fractions Prescribed: 10
Plan Total Fractions Prescribed: 7
Plan Total Prescribed Dose: 21 Gy
Plan Total Prescribed Dose: 30 Gy
Reference Point Dosage Given to Date: 12 Gy
Reference Point Dosage Given to Date: 12 Gy
Reference Point Session Dosage Given: 3 Gy
Reference Point Session Dosage Given: 3 Gy
Session Number: 4

## 2023-07-13 MED ORDER — SODIUM CHLORIDE 0.9 % IV SOLN
INTRAVENOUS | Status: AC
Start: 2023-07-13 — End: 2023-07-13

## 2023-07-13 NOTE — Patient Instructions (Signed)

## 2023-07-14 ENCOUNTER — Ambulatory Visit
Admission: RE | Admit: 2023-07-14 | Discharge: 2023-07-14 | Disposition: A | Discharge status: Home / Self Care | Payer: No Typology Code available for payment source | Source: Ambulatory Visit | Attending: Radiation Oncology

## 2023-07-14 ENCOUNTER — Other Ambulatory Visit: Payer: Self-pay

## 2023-07-14 DIAGNOSIS — Z510 Encounter for antineoplastic radiation therapy: Secondary | ICD-10-CM | POA: Diagnosis not present

## 2023-07-14 LAB — RAD ONC ARIA SESSION SUMMARY
Course Elapsed Days: 6
Plan Fractions Treated to Date: 5
Plan Fractions Treated to Date: 5
Plan Prescribed Dose Per Fraction: 3 Gy
Plan Prescribed Dose Per Fraction: 3 Gy
Plan Total Fractions Prescribed: 10
Plan Total Fractions Prescribed: 7
Plan Total Prescribed Dose: 21 Gy
Plan Total Prescribed Dose: 30 Gy
Reference Point Dosage Given to Date: 15 Gy
Reference Point Dosage Given to Date: 15 Gy
Reference Point Session Dosage Given: 3 Gy
Reference Point Session Dosage Given: 3 Gy
Session Number: 5

## 2023-07-15 ENCOUNTER — Ambulatory Visit
Admission: RE | Admit: 2023-07-15 | Discharge: 2023-07-15 | Disposition: A | Discharge status: Home / Self Care | Payer: No Typology Code available for payment source | Source: Ambulatory Visit | Attending: Radiation Oncology

## 2023-07-15 ENCOUNTER — Other Ambulatory Visit: Payer: Self-pay

## 2023-07-15 DIAGNOSIS — Z510 Encounter for antineoplastic radiation therapy: Secondary | ICD-10-CM | POA: Diagnosis not present

## 2023-07-15 LAB — RAD ONC ARIA SESSION SUMMARY
Course Elapsed Days: 7
Plan Fractions Treated to Date: 6
Plan Fractions Treated to Date: 6
Plan Prescribed Dose Per Fraction: 3 Gy
Plan Prescribed Dose Per Fraction: 3 Gy
Plan Total Fractions Prescribed: 10
Plan Total Fractions Prescribed: 7
Plan Total Prescribed Dose: 21 Gy
Plan Total Prescribed Dose: 30 Gy
Reference Point Dosage Given to Date: 18 Gy
Reference Point Dosage Given to Date: 18 Gy
Reference Point Session Dosage Given: 3 Gy
Reference Point Session Dosage Given: 3 Gy
Session Number: 6

## 2023-07-16 ENCOUNTER — Ambulatory Visit: Payer: No Typology Code available for payment source

## 2023-07-16 DIAGNOSIS — Z510 Encounter for antineoplastic radiation therapy: Secondary | ICD-10-CM | POA: Diagnosis not present

## 2023-07-17 ENCOUNTER — Other Ambulatory Visit: Payer: Self-pay

## 2023-07-17 ENCOUNTER — Ambulatory Visit
Admission: RE | Admit: 2023-07-17 | Discharge: 2023-07-17 | Disposition: A | Discharge status: Home / Self Care | Payer: No Typology Code available for payment source | Source: Ambulatory Visit | Attending: Radiation Oncology | Admitting: Radiation Oncology

## 2023-07-17 ENCOUNTER — Ambulatory Visit
Admission: RE | Admit: 2023-07-17 | Discharge: 2023-07-17 | Disposition: A | Discharge status: Home / Self Care | Source: Ambulatory Visit | Attending: Radiation Oncology | Admitting: Radiation Oncology

## 2023-07-17 DIAGNOSIS — Z510 Encounter for antineoplastic radiation therapy: Secondary | ICD-10-CM | POA: Diagnosis not present

## 2023-07-17 LAB — RAD ONC ARIA SESSION SUMMARY
Course Elapsed Days: 9
Plan Fractions Treated to Date: 7
Plan Fractions Treated to Date: 7
Plan Prescribed Dose Per Fraction: 3 Gy
Plan Prescribed Dose Per Fraction: 3 Gy
Plan Total Fractions Prescribed: 10
Plan Total Fractions Prescribed: 7
Plan Total Prescribed Dose: 21 Gy
Plan Total Prescribed Dose: 30 Gy
Reference Point Dosage Given to Date: 21 Gy
Reference Point Dosage Given to Date: 21 Gy
Reference Point Session Dosage Given: 3 Gy
Reference Point Session Dosage Given: 3 Gy
Session Number: 7

## 2023-07-20 ENCOUNTER — Other Ambulatory Visit: Payer: Self-pay

## 2023-07-20 ENCOUNTER — Ambulatory Visit
Admission: RE | Admit: 2023-07-20 | Discharge: 2023-07-20 | Disposition: A | Discharge status: Home / Self Care | Payer: No Typology Code available for payment source | Source: Ambulatory Visit | Attending: Radiation Oncology | Admitting: Radiation Oncology

## 2023-07-20 DIAGNOSIS — Z510 Encounter for antineoplastic radiation therapy: Secondary | ICD-10-CM | POA: Diagnosis not present

## 2023-07-20 LAB — RAD ONC ARIA SESSION SUMMARY
Course Elapsed Days: 12
Plan Fractions Treated to Date: 1
Plan Fractions Treated to Date: 8
Plan Prescribed Dose Per Fraction: 3 Gy
Plan Prescribed Dose Per Fraction: 3 Gy
Plan Total Fractions Prescribed: 10
Plan Total Fractions Prescribed: 3
Plan Total Prescribed Dose: 30 Gy
Plan Total Prescribed Dose: 9 Gy
Reference Point Dosage Given to Date: 24 Gy
Reference Point Dosage Given to Date: 3 Gy
Reference Point Session Dosage Given: 3 Gy
Reference Point Session Dosage Given: 3 Gy
Session Number: 8

## 2023-07-21 ENCOUNTER — Encounter: Payer: Self-pay | Admitting: Hematology

## 2023-07-21 ENCOUNTER — Inpatient Hospital Stay: Payer: No Typology Code available for payment source | Admitting: Hematology

## 2023-07-21 ENCOUNTER — Ambulatory Visit: Payer: No Typology Code available for payment source

## 2023-07-21 ENCOUNTER — Ambulatory Visit
Admission: RE | Admit: 2023-07-21 | Discharge: 2023-07-21 | Disposition: A | Discharge status: Home / Self Care | Source: Ambulatory Visit | Attending: Radiation Oncology | Admitting: Radiation Oncology

## 2023-07-21 ENCOUNTER — Other Ambulatory Visit: Payer: Self-pay

## 2023-07-21 VITALS — BP 130/64 | HR 112 | Temp 97.9°F | Resp 19 | Ht 70.0 in | Wt 129.2 lb

## 2023-07-21 DIAGNOSIS — Z515 Encounter for palliative care: Secondary | ICD-10-CM

## 2023-07-21 DIAGNOSIS — Z510 Encounter for antineoplastic radiation therapy: Secondary | ICD-10-CM | POA: Diagnosis not present

## 2023-07-21 DIAGNOSIS — G893 Neoplasm related pain (acute) (chronic): Secondary | ICD-10-CM | POA: Diagnosis not present

## 2023-07-21 DIAGNOSIS — C220 Liver cell carcinoma: Secondary | ICD-10-CM | POA: Diagnosis not present

## 2023-07-21 LAB — RAD ONC ARIA SESSION SUMMARY
Course Elapsed Days: 13
Plan Fractions Treated to Date: 2
Plan Fractions Treated to Date: 9
Plan Prescribed Dose Per Fraction: 3 Gy
Plan Prescribed Dose Per Fraction: 3 Gy
Plan Total Fractions Prescribed: 10
Plan Total Fractions Prescribed: 3
Plan Total Prescribed Dose: 30 Gy
Plan Total Prescribed Dose: 9 Gy
Reference Point Dosage Given to Date: 27 Gy
Reference Point Dosage Given to Date: 6 Gy
Reference Point Session Dosage Given: 3 Gy
Reference Point Session Dosage Given: 3 Gy
Session Number: 9

## 2023-07-21 MED ORDER — HYDROCODONE-ACETAMINOPHEN 5-325 MG PO TABS: 1.0000 | ORAL_TABLET | Freq: Four times a day (QID) | ORAL | 0 refills | Status: DC | PRN

## 2023-07-21 NOTE — Assessment & Plan Note (Signed)
 cT3N1M0, stage IVA, G3 -history of cirrhosis since ~2011 and hepatitis C diagnosed and treated in 2018 -diagnosed by liver biopsy on 11/04/21, baseline AFP normal  -s/p palliative radiation under Dr. Mitzi Hansen, 7/20-12/17/21 -he began Tecentriq and bevacizumab on 12/06/21. Tolerating well overall. 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 Milford Cage. -Restaging CT scan from April 21, 2022 showed excellent partial response to treatment, reviewed with patient and his wife today. -treatment held in Dec 2023 due to hospital admission for N/V and poor oral intake. Restarted on 05/14/2022 -Restaging CT abdomen pelvis from August 25, 2022 showed mixed response to treatment. Restaging CT on 11/13/2021 showed further disease progression -I changed his treatment to [facility], unfortunately he tolerated poorly and ended up in the hospital with hepatic encephalopathy after first week of Lenvima, his liver function also got worse.  He did recover well after he came off Lenvima -He is on surveillance now. -restaging CT scan from January 27, 2023, which showed stable liver lesions but progression of abdominal lymphadenopathy  -admitted to hospital for bacteriemia on 04/21/2023, CT scan showed metastatic cancer progression  -Due to his worsening performance status and cancer related symptoms, he agreed to DNR and will continue palliative care alone.
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
## 2023-07-22 ENCOUNTER — Other Ambulatory Visit: Payer: Self-pay

## 2023-07-22 ENCOUNTER — Ambulatory Visit
Admission: RE | Admit: 2023-07-22 | Discharge: 2023-07-22 | Disposition: A | Discharge status: Home / Self Care | Source: Ambulatory Visit | Attending: Radiation Oncology | Admitting: Radiation Oncology

## 2023-07-22 DIAGNOSIS — Z510 Encounter for antineoplastic radiation therapy: Secondary | ICD-10-CM | POA: Diagnosis not present

## 2023-07-22 LAB — RAD ONC ARIA SESSION SUMMARY
Course Elapsed Days: 14
Plan Fractions Treated to Date: 10
Plan Fractions Treated to Date: 3
Plan Prescribed Dose Per Fraction: 3 Gy
Plan Prescribed Dose Per Fraction: 3 Gy
Plan Total Fractions Prescribed: 10
Plan Total Fractions Prescribed: 3
Plan Total Prescribed Dose: 30 Gy
Plan Total Prescribed Dose: 9 Gy
Reference Point Dosage Given to Date: 30 Gy
Reference Point Dosage Given to Date: 9 Gy
Reference Point Session Dosage Given: 3 Gy
Reference Point Session Dosage Given: 3 Gy
Session Number: 10

## 2023-07-23 NOTE — Radiation Completion Notes (Addendum)
  Radiation Oncology         [phone] ________________________________  Name: Troy [name] MRN: [id number]  Date of Service: 07/22/2023  DOB: 1955/11/12  End of Treatment Note     Diagnosis:   Recurrent metastatic hepatocellular carcinoma involving the retroperitoneal nodes   Intent: Palliative     ==========DELIVERED PLANS==========  First Treatment Date: 2023-07-08 Last Treatment Date: 2023-07-22   Plan Name: Abd Site: Abdomen Technique: 3D Mode: Photon Dose Per Fraction: 3 Gy Prescribed Dose (Delivered / Prescribed): 21 Gy / 21 Gy Prescribed Fxs (Delivered / Prescribed): 7 / 7   Plan Name: Pelvis_R Site: Pelvis Technique: Isodose Plan Mode: Photon Dose Per Fraction: 3 Gy Prescribed Dose (Delivered / Prescribed): 30 Gy / 30 Gy Prescribed Fxs (Delivered / Prescribed): 10 / 10   Plan Name: Abd_Bst Site: Abdomen Technique: 3D Mode: Photon Dose Per Fraction: 3 Gy Prescribed Dose (Delivered / Prescribed): 9 Gy / 9 Gy Prescribed Fxs (Delivered / Prescribed): 3 / 3     ==========ON TREATMENT VISIT DATES========== 2023-07-10, 2023-07-17   See weekly On Treatment Notes in Epic for details in the Media tab (listed as Progress notes on the On Treatment Visit Dates listed above). The patient tolerated radiation. He developed fatigue and developed nausea during therapy. He continued to navigate pain throughout his treatment.  The patient will receive a call in about one month from the radiation oncology department. He will continue follow up with Dr. [name] as well.      Osker Mason, PAC

## 2023-07-28 ENCOUNTER — Emergency Department (HOSPITAL_COMMUNITY)

## 2023-07-28 ENCOUNTER — Inpatient Hospital Stay (HOSPITAL_COMMUNITY)
Admission: EM | Admit: 2023-07-28 | Discharge: 2023-08-05 | DRG: 435 | Disposition: A | Discharge status: Hospice | Attending: Internal Medicine | Admitting: Internal Medicine

## 2023-07-28 ENCOUNTER — Telehealth: Payer: Self-pay

## 2023-07-28 ENCOUNTER — Other Ambulatory Visit: Payer: Self-pay

## 2023-07-28 ENCOUNTER — Encounter (HOSPITAL_COMMUNITY): Payer: Self-pay | Admitting: Emergency Medicine

## 2023-07-28 DIAGNOSIS — Z515 Encounter for palliative care: Secondary | ICD-10-CM | POA: Diagnosis not present

## 2023-07-28 DIAGNOSIS — R627 Adult failure to thrive: Secondary | ICD-10-CM | POA: Diagnosis present

## 2023-07-28 DIAGNOSIS — K7460 Unspecified cirrhosis of liver: Secondary | ICD-10-CM | POA: Diagnosis present

## 2023-07-28 DIAGNOSIS — E860 Dehydration: Secondary | ICD-10-CM | POA: Diagnosis present

## 2023-07-28 DIAGNOSIS — C786 Secondary malignant neoplasm of retroperitoneum and peritoneum: Secondary | ICD-10-CM | POA: Diagnosis present

## 2023-07-28 DIAGNOSIS — D693 Immune thrombocytopenic purpura: Secondary | ICD-10-CM | POA: Insufficient documentation

## 2023-07-28 DIAGNOSIS — G894 Chronic pain syndrome: Secondary | ICD-10-CM | POA: Diagnosis present

## 2023-07-28 DIAGNOSIS — R180 Malignant ascites: Secondary | ICD-10-CM | POA: Diagnosis present

## 2023-07-28 DIAGNOSIS — D539 Nutritional anemia, unspecified: Secondary | ICD-10-CM | POA: Diagnosis present

## 2023-07-28 DIAGNOSIS — R066 Hiccough: Secondary | ICD-10-CM | POA: Diagnosis not present

## 2023-07-28 DIAGNOSIS — I8510 Secondary esophageal varices without bleeding: Secondary | ICD-10-CM | POA: Diagnosis present

## 2023-07-28 DIAGNOSIS — Z794 Long term (current) use of insulin: Secondary | ICD-10-CM

## 2023-07-28 DIAGNOSIS — D649 Anemia, unspecified: Secondary | ICD-10-CM | POA: Insufficient documentation

## 2023-07-28 DIAGNOSIS — K767 Hepatorenal syndrome: Secondary | ICD-10-CM | POA: Diagnosis present

## 2023-07-28 DIAGNOSIS — I10 Essential (primary) hypertension: Secondary | ICD-10-CM | POA: Diagnosis present

## 2023-07-28 DIAGNOSIS — R54 Age-related physical debility: Secondary | ICD-10-CM | POA: Diagnosis present

## 2023-07-28 DIAGNOSIS — R109 Unspecified abdominal pain: Secondary | ICD-10-CM

## 2023-07-28 DIAGNOSIS — G893 Neoplasm related pain (acute) (chronic): Secondary | ICD-10-CM | POA: Diagnosis present

## 2023-07-28 DIAGNOSIS — I868 Varicose veins of other specified sites: Secondary | ICD-10-CM | POA: Diagnosis present

## 2023-07-28 DIAGNOSIS — R140 Abdominal distension (gaseous): Secondary | ICD-10-CM | POA: Diagnosis present

## 2023-07-28 DIAGNOSIS — R1084 Generalized abdominal pain: Secondary | ICD-10-CM

## 2023-07-28 DIAGNOSIS — R64 Cachexia: Secondary | ICD-10-CM | POA: Diagnosis present

## 2023-07-28 DIAGNOSIS — E7800 Pure hypercholesterolemia, unspecified: Secondary | ICD-10-CM | POA: Diagnosis present

## 2023-07-28 DIAGNOSIS — Z87891 Personal history of nicotine dependence: Secondary | ICD-10-CM

## 2023-07-28 DIAGNOSIS — K219 Gastro-esophageal reflux disease without esophagitis: Secondary | ICD-10-CM | POA: Diagnosis present

## 2023-07-28 DIAGNOSIS — I81 Portal vein thrombosis: Secondary | ICD-10-CM | POA: Diagnosis present

## 2023-07-28 DIAGNOSIS — Z8249 Family history of ischemic heart disease and other diseases of the circulatory system: Secondary | ICD-10-CM

## 2023-07-28 DIAGNOSIS — E876 Hypokalemia: Secondary | ICD-10-CM | POA: Diagnosis present

## 2023-07-28 DIAGNOSIS — R1010 Upper abdominal pain, unspecified: Secondary | ICD-10-CM | POA: Diagnosis not present

## 2023-07-28 DIAGNOSIS — Z7189 Other specified counseling: Secondary | ICD-10-CM | POA: Diagnosis not present

## 2023-07-28 DIAGNOSIS — Z9221 Personal history of antineoplastic chemotherapy: Secondary | ICD-10-CM

## 2023-07-28 DIAGNOSIS — K766 Portal hypertension: Secondary | ICD-10-CM | POA: Diagnosis present

## 2023-07-28 DIAGNOSIS — Z7984 Long term (current) use of oral hypoglycemic drugs: Secondary | ICD-10-CM

## 2023-07-28 DIAGNOSIS — B182 Chronic viral hepatitis C: Secondary | ICD-10-CM | POA: Diagnosis present

## 2023-07-28 DIAGNOSIS — I959 Hypotension, unspecified: Secondary | ICD-10-CM | POA: Diagnosis not present

## 2023-07-28 DIAGNOSIS — N179 Acute kidney failure, unspecified: Secondary | ICD-10-CM | POA: Diagnosis present

## 2023-07-28 DIAGNOSIS — Z66 Do not resuscitate: Secondary | ICD-10-CM | POA: Diagnosis present

## 2023-07-28 DIAGNOSIS — D630 Anemia in neoplastic disease: Secondary | ICD-10-CM | POA: Diagnosis present

## 2023-07-28 DIAGNOSIS — Z681 Body mass index (BMI) 19 or less, adult: Secondary | ICD-10-CM

## 2023-07-28 DIAGNOSIS — K319 Disease of stomach and duodenum, unspecified: Secondary | ICD-10-CM | POA: Diagnosis present

## 2023-07-28 DIAGNOSIS — Z79899 Other long term (current) drug therapy: Secondary | ICD-10-CM

## 2023-07-28 DIAGNOSIS — E119 Type 2 diabetes mellitus without complications: Secondary | ICD-10-CM | POA: Diagnosis present

## 2023-07-28 DIAGNOSIS — C220 Liver cell carcinoma: Secondary | ICD-10-CM | POA: Diagnosis present

## 2023-07-28 DIAGNOSIS — Z1152 Encounter for screening for COVID-19: Secondary | ICD-10-CM

## 2023-07-28 DIAGNOSIS — D500 Iron deficiency anemia secondary to blood loss (chronic): Secondary | ICD-10-CM | POA: Diagnosis present

## 2023-07-28 DIAGNOSIS — Z8505 Personal history of malignant neoplasm of liver: Secondary | ICD-10-CM

## 2023-07-28 DIAGNOSIS — K5909 Other constipation: Secondary | ICD-10-CM | POA: Diagnosis present

## 2023-07-28 DIAGNOSIS — Z800 Family history of malignant neoplasm of digestive organs: Secondary | ICD-10-CM

## 2023-07-28 DIAGNOSIS — Z86718 Personal history of other venous thrombosis and embolism: Secondary | ICD-10-CM

## 2023-07-28 DIAGNOSIS — K3189 Other diseases of stomach and duodenum: Secondary | ICD-10-CM | POA: Diagnosis present

## 2023-07-28 DIAGNOSIS — Z923 Personal history of irradiation: Secondary | ICD-10-CM

## 2023-07-28 LAB — COMPREHENSIVE METABOLIC PANEL
ALT: 18 U/L (ref 0–44)
AST: 53 U/L — ABNORMAL HIGH (ref 15–41)
Albumin: 2.1 g/dL — ABNORMAL LOW (ref 3.5–5.0)
Alkaline Phosphatase: 193 U/L — ABNORMAL HIGH (ref 38–126)
Anion gap: 10 (ref 5–15)
BUN: 22 mg/dL (ref 8–23)
CO2: 20 mmol/L — ABNORMAL LOW (ref 22–32)
Calcium: 8.8 mg/dL — ABNORMAL LOW (ref 8.9–10.3)
Chloride: 101 mmol/L (ref 98–111)
Creatinine, Ser: 0.96 mg/dL (ref 0.61–1.24)
GFR, Estimated: >60 mL/min (ref >60)
Glucose, Bld: 319 mg/dL — ABNORMAL HIGH (ref 70–99)
Potassium: 4.3 mmol/L (ref 3.5–5.1)
Sodium: 131 mmol/L — ABNORMAL LOW (ref 135–145)
Total Bilirubin: 1.1 mg/dL (ref 0.0–1.2)
Total Protein: 7.5 g/dL (ref 6.5–8.1)

## 2023-07-28 LAB — CBC WITH DIFFERENTIAL/PLATELET
Abs Immature Granulocytes: 0.05 10*3/uL (ref 0.00–0.07)
Basophils Absolute: 0 10*3/uL (ref 0.0–0.1)
Basophils Relative: 1 %
Eosinophils Absolute: 0.1 10*3/uL (ref 0.0–0.5)
Eosinophils Relative: 2 %
HCT: 27.5 % — ABNORMAL LOW (ref 39.0–52.0)
Hemoglobin: 8.8 g/dL — ABNORMAL LOW (ref 13.0–17.0)
Immature Granulocytes: 1 %
Lymphocytes Relative: 5 %
Lymphs Abs: 0.3 10*3/uL — ABNORMAL LOW (ref 0.7–4.0)
MCH: 30.8 pg (ref 26.0–34.0)
MCHC: 32 g/dL (ref 30.0–36.0)
MCV: 96.2 fL (ref 80.0–100.0)
Monocytes Absolute: 0.7 10*3/uL (ref 0.1–1.0)
Monocytes Relative: 12 %
Neutro Abs: 4.6 10*3/uL (ref 1.7–7.7)
Neutrophils Relative %: 79 %
Platelets: 172 10*3/uL (ref 150–400)
RBC: 2.86 MIL/uL — ABNORMAL LOW (ref 4.22–5.81)
RDW: 16.3 % — ABNORMAL HIGH (ref 11.5–15.5)
WBC: 5.7 10*3/uL (ref 4.0–10.5)
nRBC: 0 % (ref 0.0–0.2)

## 2023-07-28 LAB — RESP PANEL BY RT-PCR (RSV, FLU A&B, COVID)  RVPGX2
Influenza A by PCR: NEGATIVE
Influenza B by PCR: NEGATIVE
Resp Syncytial Virus by PCR: NEGATIVE
SARS Coronavirus 2 by RT PCR: NEGATIVE

## 2023-07-28 LAB — CBG MONITORING, ED: Glucose-Capillary: 286 mg/dL — ABNORMAL HIGH (ref 70–99)

## 2023-07-28 MED ORDER — SODIUM CHLORIDE 0.9% FLUSH: 3.0000 mL | INTRAVENOUS | Status: DC | PRN

## 2023-07-28 MED ORDER — ONDANSETRON HCL 4 MG PO TABS: 4.0000 mg | ORAL_TABLET | Freq: Four times a day (QID) | ORAL | Status: DC | PRN

## 2023-07-28 MED ORDER — ACETAMINOPHEN 325 MG PO TABS
650.0000 mg | ORAL_TABLET | Freq: Four times a day (QID) | ORAL | Status: DC | PRN
  Administered 2023-08-04: 650 mg via ORAL
  Filled 2023-07-28: qty 2

## 2023-07-28 MED ORDER — HYDROCODONE-ACETAMINOPHEN 5-325 MG PO TABS
1.0000 | ORAL_TABLET | Freq: Four times a day (QID) | ORAL | Status: DC | PRN
  Administered 2023-07-29 – 2023-08-02 (×3): 1 via ORAL
  Filled 2023-07-28 (×4): qty 1

## 2023-07-28 MED ORDER — ACETAMINOPHEN 650 MG RE SUPP: 650.0000 mg | Freq: Four times a day (QID) | RECTAL | Status: DC | PRN

## 2023-07-28 MED ORDER — SODIUM CHLORIDE 0.9% FLUSH
3.0000 mL | Freq: Two times a day (BID) | INTRAVENOUS | Status: DC
  Administered 2023-07-29 – 2023-08-04 (×11): 3 mL via INTRAVENOUS

## 2023-07-28 MED ORDER — POLYETHYLENE GLYCOL 3350 17 G PO PACK
17.0000 g | PACK | Freq: Every day | ORAL | Status: DC
  Administered 2023-07-29 – 2023-08-05 (×7): 17 g via ORAL
  Filled 2023-07-28 (×7): qty 1

## 2023-07-28 MED ORDER — MORPHINE SULFATE (PF) 4 MG/ML IV SOLN
4.0000 mg | Freq: Once | INTRAVENOUS | Status: AC
  Administered 2023-07-28: 4 mg via INTRAVENOUS
  Filled 2023-07-28: qty 1

## 2023-07-28 MED ORDER — HYDROCODONE-ACETAMINOPHEN 5-325 MG PO TABS: 1.0000 | ORAL_TABLET | Freq: Four times a day (QID) | ORAL | Status: DC | PRN

## 2023-07-28 MED ORDER — ONDANSETRON HCL 4 MG/2ML IJ SOLN
4.0000 mg | Freq: Four times a day (QID) | INTRAMUSCULAR | Status: DC | PRN
  Administered 2023-07-29 – 2023-08-04 (×10): 4 mg via INTRAVENOUS
  Filled 2023-07-28 (×11): qty 2

## 2023-07-28 MED ORDER — SUCRALFATE 1 G PO TABS
1.0000 g | ORAL_TABLET | Freq: Two times a day (BID) | ORAL | Status: DC
  Administered 2023-07-29 – 2023-08-05 (×16): 1 g via ORAL
  Filled 2023-07-28 (×16): qty 1

## 2023-07-28 MED ORDER — INSULIN GLARGINE-YFGN 100 UNIT/ML ~~LOC~~ SOPN: 15.0000 [IU] | PEN_INJECTOR | Freq: Every day | SUBCUTANEOUS | Status: DC

## 2023-07-28 MED ORDER — SODIUM CHLORIDE 0.9 % IV BOLUS
500.0000 mL | Freq: Once | INTRAVENOUS | Status: AC
  Administered 2023-07-28: 500 mL via INTRAVENOUS

## 2023-07-28 MED ORDER — SODIUM CHLORIDE 0.9% FLUSH
3.0000 mL | Freq: Two times a day (BID) | INTRAVENOUS | Status: DC
  Administered 2023-07-29 – 2023-08-04 (×7): 3 mL via INTRAVENOUS

## 2023-07-28 MED ORDER — INSULIN ASPART 100 UNIT/ML IJ SOLN
0.0000 [IU] | Freq: Three times a day (TID) | INTRAMUSCULAR | Status: DC
  Administered 2023-07-29: 5 [IU] via SUBCUTANEOUS
  Administered 2023-07-29 – 2023-07-30 (×2): 3 [IU] via SUBCUTANEOUS
  Filled 2023-07-28: qty 0.15

## 2023-07-28 MED ORDER — IOHEXOL 300 MG/ML  SOLN
100.0000 mL | Freq: Once | INTRAMUSCULAR | Status: AC | PRN
  Administered 2023-07-28: 100 mL via INTRAVENOUS

## 2023-07-28 MED ORDER — FERROUS SULFATE 325 (65 FE) MG PO TABS
325.0000 mg | ORAL_TABLET | Freq: Every day | ORAL | Status: DC
  Administered 2023-07-29 – 2023-08-05 (×8): 325 mg via ORAL
  Filled 2023-07-28 (×8): qty 1

## 2023-07-28 MED ORDER — INSULIN GLARGINE 100 UNIT/ML ~~LOC~~ SOLN
15.0000 [IU] | Freq: Every day | SUBCUTANEOUS | Status: DC
  Administered 2023-07-29: 15 [IU] via SUBCUTANEOUS
  Filled 2023-07-28 (×3): qty 0.15

## 2023-07-28 MED ORDER — MORPHINE SULFATE ER 15 MG PO TBCR: 15.0000 mg | EXTENDED_RELEASE_TABLET | Freq: Two times a day (BID) | ORAL | Status: DC | PRN

## 2023-07-28 MED ORDER — PANTOPRAZOLE SODIUM 40 MG PO TBEC
40.0000 mg | DELAYED_RELEASE_TABLET | Freq: Two times a day (BID) | ORAL | Status: DC
  Administered 2023-07-29 – 2023-07-31 (×5): 40 mg via ORAL
  Filled 2023-07-28 (×5): qty 1

## 2023-07-28 MED ORDER — SODIUM CHLORIDE 0.9 % IV SOLN: 250.0000 mL | INTRAVENOUS | Status: AC | PRN

## 2023-07-28 MED ORDER — DICYCLOMINE HCL 10 MG PO CAPS
10.0000 mg | ORAL_CAPSULE | Freq: Three times a day (TID) | ORAL | Status: DC
  Administered 2023-07-29 – 2023-07-30 (×5): 10 mg via ORAL
  Filled 2023-07-28 (×6): qty 1

## 2023-07-28 MED ORDER — SENNOSIDES-DOCUSATE SODIUM 8.6-50 MG PO TABS
1.0000 | ORAL_TABLET | Freq: Two times a day (BID) | ORAL | Status: DC
  Administered 2023-07-29 – 2023-08-05 (×15): 1 via ORAL
  Filled 2023-07-28 (×15): qty 1

## 2023-07-28 MED ORDER — AMLODIPINE BESYLATE 10 MG PO TABS: 10.0000 mg | ORAL_TABLET | Freq: Every day | ORAL | Status: DC | PRN

## 2023-07-28 MED ORDER — MORPHINE SULFATE ER 15 MG PO TBCR
15.0000 mg | EXTENDED_RELEASE_TABLET | Freq: Two times a day (BID) | ORAL | Status: DC
  Administered 2023-07-29 – 2023-08-01 (×8): 15 mg via ORAL
  Filled 2023-07-28 (×8): qty 1

## 2023-07-28 MED ORDER — PROPRANOLOL HCL 20 MG PO TABS
20.0000 mg | ORAL_TABLET | Freq: Two times a day (BID) | ORAL | Status: DC
  Administered 2023-07-29 (×3): 20 mg via ORAL
  Filled 2023-07-28: qty 1
  Filled 2023-07-28: qty 2
  Filled 2023-07-28 (×2): qty 1

## 2023-07-28 MED ORDER — LISINOPRIL 20 MG PO TABS
20.0000 mg | ORAL_TABLET | Freq: Every day | ORAL | Status: DC
  Administered 2023-07-29: 20 mg via ORAL
  Filled 2023-07-28: qty 1

## 2023-07-28 NOTE — ED Provider Notes (Signed)
 [place] [facility] AT [facility] Provider Note   CSN: [id number] Arrival date & time: 07/28/23  1736     History  Chief Complaint  Patient presents with   Failure To Thrive    Troy [name] is a 68 y.o. male.  With a history of recurrent metastatic hepatocellular carcinoma who presents to the ED for abdominal pain.  Patient underwent radiation therapy on March 12 for treatment of metastatic hepatocellular carcinoma involving retroperitoneal nodes.  Over the last several days he has experienced abdominal pain and swelling and feels as though his abdomen is much firmer than usual.  He also reports decreased p.o. intake secondary to decreased appetite and generalized weakness during this time.  No fevers chills chest pain or shortness of breath.  HPI     Home Medications Prior to Admission medications   Medication Sig Start Date End Date Taking? Authorizing Provider  amLODipine (NORVASC) 10 MG tablet Take 10 mg by mouth daily as needed (for an elevated B/P).    [provider]  carboxymethylcellulose (REFRESH PLUS) 0.5 % SOLN Place 1 drop into both eyes 4 (four) times daily as needed (dry eyes).    [provider]  dicyclomine (BENTYL) 10 MG capsule Take 1 capsule (10 mg total) by mouth 4 (four) times daily -  before meals and at bedtime. 06/02/23   Pickenpack-Cousar, Arty Baumgartner, NP  empagliflozin (JARDIANCE) 25 MG TABS tablet Take 25 mg by mouth daily. 11/04/19   [provider]  feeding supplement (ENSURE ENLIVE / ENSURE PLUS) LIQD Take 237 mLs by mouth 2 (two) times daily between meals. Patient taking differently: Take 237 mLs by mouth 2 (two) times daily as needed (Supplement). 11/25/22   Lorin Glass, MD  HYDROcodone-acetaminophen (NORCO/VICODIN) 5-325 MG tablet Take 1 tablet by mouth every 6 (six) hours as needed for moderate pain (pain score 4-6). 07/21/23   [name], MD  insulin glargine-yfgn (SEMGLEE, YFGN,) 100 UNIT/ML Pen Inject  15 Units into the skin daily.    [provider]  lisinopril (ZESTRIL) 20 MG tablet Take 20 mg by mouth daily. 05/28/23   [provider]  magic mouthwash (nystatin, lidocaine, diphenhydrAMINE, alum & mag hydroxide) suspension Swish and swallow 5 mLs by mouth 3 (three) times daily as needed for mouth pain. 06/22/23   [name], MD  metFORMIN (GLUCOPHAGE) 850 MG tablet Take 850 mg by mouth 2 (two) times daily.    [provider]  morphine (MS CONTIN) 15 MG 12 hr tablet Take 15 mg by mouth every 12 (twelve) hours as needed for pain.    [provider]  ondansetron (ZOFRAN) 8 MG tablet Take 1 tablet (8 mg total) by mouth every 8 (eight) hours as needed for nausea or vomiting. 09/22/22   Pollyann Samples, NP  pantoprazole (PROTONIX) 40 MG tablet Take 1 tablet (40 mg total) by mouth 2 (two) times daily before a meal. 10/19/22   [name], MD  promethazine (PHENERGAN) 12.5 MG tablet Take 2 tablets (25 mg total) by mouth every 6 (six) hours as needed for refractory nausea / vomiting. 09/22/22   Pollyann Samples, NP  sildenafil (VIAGRA) 100 MG tablet Take 100 mg by mouth daily as needed for erectile dysfunction. 05/28/23   [provider]  SORAfenib (NEXAVAR) 200 MG tablet Take 1 tablet (200 mg total) by mouth 2 (two) times daily. Give on an empty stomach 1 hour before or 2 hours after meals. 06/02/23   [name],  MD  sucralfate (CARAFATE) 1 g tablet Take 1 g by mouth 2 (two) times daily.    [provider]  prochlorperazine (COMPAZINE) 10 MG tablet Take 1 tablet (10 mg total) by mouth every 6 (six) hours as needed (Nausea or vomiting). 12/03/21 01/16/22  [name], MD      Allergies    Patient has no known allergies.    Review of Systems   Review of Systems  Physical Exam Updated Vital Signs BP 132/77   Pulse (!) 115   Temp 98.1 F (36.7 C) (Oral)   Resp 17   Ht 5\' 10"  (1.778 m)   Wt 58.6 kg   SpO2 100%   BMI 18.54 kg/m  Physical Exam Vitals and  nursing note reviewed.  HENT:     Head: Normocephalic and atraumatic.  Eyes:     Pupils: Pupils are equal, round, and reactive to light.  Cardiovascular:     Rate and Rhythm: Normal rate and regular rhythm.  Pulmonary:     Effort: Pulmonary effort is normal.     Breath sounds: Normal breath sounds.  Abdominal:     General: There is distension.     Tenderness: There is no abdominal tenderness. There is no guarding or rebound.     Comments: Firm nontender abdomen  Skin:    General: Skin is warm and dry.  Neurological:     Mental Status: He is alert.  Psychiatric:        Mood and Affect: Mood normal.     ED Results / Procedures / Treatments   Labs (all labs ordered are listed, but only abnormal results are displayed) Labs Reviewed  CBC WITH DIFFERENTIAL/PLATELET - Abnormal; Notable for the following components:      Result Value   RBC 2.86 (*)    Hemoglobin 8.8 (*)    HCT 27.5 (*)    RDW 16.3 (*)    Lymphs Abs 0.3 (*)    All other components within normal limits  COMPREHENSIVE METABOLIC PANEL - Abnormal; Notable for the following components:   Sodium 131 (*)    CO2 20 (*)    Glucose, Bld 319 (*)    Calcium 8.8 (*)    Albumin 2.1 (*)    AST 53 (*)    Alkaline Phosphatase 193 (*)    All other components within normal limits  CBG MONITORING, ED - Abnormal; Notable for the following components:   Glucose-Capillary 286 (*)    All other components within normal limits  RESP PANEL BY RT-PCR (RSV, FLU A&B, COVID)  RVPGX2  URINALYSIS, ROUTINE W REFLEX MICROSCOPIC    EKG EKG Interpretation Date/Time:  Tuesday July 28 2023 19:15:30 EDT Ventricular Rate:  110 PR Interval:  111 QRS Duration:  79 QT Interval:  310 QTC Calculation: 420 R Axis:   -21  Text Interpretation: Sinus tachycardia Borderline left axis deviation Probable anterior infarct, age indeterminate Confirmed by Estelle June [id number]) on 07/28/2023 11:33:57 PM  Radiology CT ABDOMEN PELVIS W CONTRAST Result  Date: 07/28/2023 CLINICAL DATA:  Lower abdominal pain and swelling status post radiation. Generalized weakness and decreased appetite. History of liver cancer, last treatment on Wednesday. EXAM: CT ABDOMEN AND PELVIS WITH CONTRAST TECHNIQUE: Multidetector CT imaging of the abdomen and pelvis was performed using the standard protocol following bolus administration of intravenous contrast. RADIATION DOSE REDUCTION: This exam was performed according to the departmental dose-optimization program which includes automated exposure control, adjustment of the mA and/or kV according to patient size  and/or use of iterative reconstruction technique. CONTRAST:  OMNIPAQUE IOHEXOL 300 MG/ML  SOLN COMPARISON:  06/11/2023 FINDINGS: Lower chest: Bibasilar atelectasis/scarring. Retained metallic pellets in the lung bases. Hepatobiliary: Cirrhosis. Similar size of the mass centered in the left lobe of the liver measuring approximately 6 x 9 cm. Decompressed gallbladder. No biliary dilation. Pancreas: No ductal dilation or inflammatory changes. Spleen: Unremarkable. Adrenals/Urinary Tract: Unremarkable adrenal glands and kidneys. Unremarkable bladder. Stomach/Bowel: No bowel obstruction. Wall thickening about the small bowel and colon favored due to portal congestive colopathy/enteropathy. Normal appendix. Stomach is within normal limits. Vascular/Lymphatic: Extensive mesenteric and retroperitoneal adenopathy. Index lymph nodes as follows: Left periaortic node (series 2/image 42) measures 7.2 x 5.6 cm, previously 7.8 x 6.1 cm; Left periaortic node (series 2/image 35) measures 5.4 x 4.3 cm, previously 5.2 x 4.3 cm; Porta hepatis node (series 2/image 34) measures 5.4 x 4.9 cm, previously 5.0 x 4.1 cm. 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 For example 1.3 cm nodule anterior to the gastric body (series 2/image 28). Unchanged size of the right rectus sheath metastasis measuring 2.8 x 3.3 cm. 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 Multiple varices contain nonocclusive thrombus. For example in a perigastric varix (series 7/image 60, a splenic varix (7/71-77), and the left renal vein (7/65). Reproductive: No acute abnormality. Other: Large volume low-density abdominopelvic ascites. No free intraperitoneal air. No abscess. Musculoskeletal: No acute fracture or destructive osseous lesion. IMPRESSION: 1. Cirrhosis with similar size of the mass centered in the left lobe of the liver. 2. Extensive mesenteric and retroperitoneal adenopathy, some of which are slightly decreased in size while others are slightly increased in size. 3. 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 4. 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 New thrombus in the perigastric and perisplenic varices. 5. Wall thickening about the small bowel and colon favored due to portal congestive colopathy/enteropathy. 6. Large volume low-density abdominopelvic ascites. These results were called by telephone at the time of interpretation on 07/28/2023 at 10:28 pm to provider [facility] , who verbally acknowledged these results. Electronically Signed   By: Minerva Fester M.D.   On: 07/28/2023 22:32   DG Chest Portable 1 View Result Date: 07/28/2023 CLINICAL DATA:  History of diabetes and liver cancer, presenting with generalized weakness. EXAM: PORTABLE CHEST 1 VIEW COMPARISON:  June 11, 2023 FINDINGS: The right-sided venous Port-A-Cath seen on the prior study has been removed. The heart size and mediastinal contours are within normal limits. Low lung volumes are noted. A stable subcentimeter calcified lung nodule is seen within the periphery of the right lung base. There is no evidence of acute infiltrate, pleural effusion or pneumothorax. Numerous tiny radiopaque foreign bodies are again seen  overlying the left upper quadrant. The visualized skeletal structures are unremarkable. IMPRESSION: Low lung volumes without evidence of acute or active cardiopulmonary disease. Electronically Signed   By: Aram Candela M.D.   On: 07/28/2023 19:51    Procedures Procedures    Medications Ordered in ED Medications  morphine (PF) 4 MG/ML injection 4 mg (has no administration in time range)  iohexol (OMNIPAQUE) 300 MG/ML solution 100 mL (100 mLs Intravenous Contrast Given 07/28/23 2057)  sodium chloride 0.9 % bolus 500 mL (500 mLs Intravenous New Bag/Given 07/28/23 2144)    ED Course/ Medical Decision Making/ A&P Clinical Course as of 07/28/23 2335  Tue Jul 28, 2023  2229 Call taken from radiology.  CT abdomen pelvis shows multiple thrombi in variceal veins among other chronic findings as described in the report [MP]  2333 Reviewed CT findings worrisome for progressive metastatic disease with patient and his wife.  He is reporting abdominal pain now.  Will give a dose of morphine.  Discussed with admitting hospitalist who accepts patient for admission [MP]    Clinical Course User Index [MP] Royanne Foots, DO                                 Medical Decision Making 68 year old male with history as above presenting for abdominal pain decreased p.o. intake following radiation therapy to retroperitoneal lymph nodes for metastatic hepatocellular carcinoma on March 12.  Afebrile.  Tachycardic.  Some abdominal distention and firmness on my exam without significant tenderness.    Differential diagnosis includes: Adverse effect of radiation therapy Acute intra-abdominal infectious/inflammatory process such as appendicitis, diverticulitis, pancreatitis and cholecystitis Urinary tract infection Atypical presentation for pneumonia Viral gastroenteritis  Will obtain laboratory workup including CBC with differential, metabolic panel, lipase and urinalysis along with CT abdomen pelvis  Amount  and/or Complexity of Data Reviewed Labs: ordered. Radiology: ordered.  Risk Prescription drug management. Decision regarding hospitalization.           Final Clinical Impression(s) / ED Diagnoses Final diagnoses:  Dehydration  Hepatocellular carcinoma (HCC)  Failure to thrive in adult    Rx / DC Orders ED Discharge Orders     None         Royanne Foots, DO 07/28/23 2336

## 2023-07-28 NOTE — ED Triage Notes (Signed)
 Pt. BIB [facility] from home w/ c/o generalized weakness, decreased appetite. HX of diabetes, liver cancer w/ last txt on Wednesday. Per EMS pt. BG 360 on scene. Pt. Hadn't taken insulin since Sunday. Per EMS pt. BG 320 after 500 fluid bolus given.  Per EMS: BP:140/66 P:110 SPO2: 94% RA

## 2023-07-28 NOTE — Telephone Encounter (Signed)
 Pt wife called reporting pain, increased HR, fatigue, halitosis, and a hard distended abdomen for the patient for the last 1-2 days. Reported that it is not improving and the pt is feeling worse. Pt and wife were instructed to go to the ED for further work up per [place], NP, pt wife agreeable to plan. No further needs at this time.
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
## 2023-07-29 ENCOUNTER — Encounter (HOSPITAL_COMMUNITY): Payer: Self-pay | Admitting: Internal Medicine

## 2023-07-29 DIAGNOSIS — C220 Liver cell carcinoma: Secondary | ICD-10-CM

## 2023-07-29 DIAGNOSIS — R627 Adult failure to thrive: Secondary | ICD-10-CM

## 2023-07-29 LAB — COMPREHENSIVE METABOLIC PANEL
ALT: 17 U/L (ref 0–44)
AST: 50 U/L — ABNORMAL HIGH (ref 15–41)
Albumin: 2 g/dL — ABNORMAL LOW (ref 3.5–5.0)
Alkaline Phosphatase: 170 U/L — ABNORMAL HIGH (ref 38–126)
Anion gap: 9 (ref 5–15)
BUN: 20 mg/dL (ref 8–23)
CO2: 22 mmol/L (ref 22–32)
Calcium: 8.7 mg/dL — ABNORMAL LOW (ref 8.9–10.3)
Chloride: 103 mmol/L (ref 98–111)
Creatinine, Ser: 0.95 mg/dL (ref 0.61–1.24)
GFR, Estimated: >60 mL/min (ref >60)
Glucose, Bld: 162 mg/dL — ABNORMAL HIGH (ref 70–99)
Potassium: 3.8 mmol/L (ref 3.5–5.1)
Sodium: 134 mmol/L — ABNORMAL LOW (ref 135–145)
Total Bilirubin: 0.8 mg/dL (ref 0.0–1.2)
Total Protein: 6.7 g/dL (ref 6.5–8.1)

## 2023-07-29 LAB — CBC
HCT: 24.2 % — ABNORMAL LOW (ref 39.0–52.0)
Hemoglobin: 7.6 g/dL — ABNORMAL LOW (ref 13.0–17.0)
MCH: 30.3 pg (ref 26.0–34.0)
MCHC: 31.4 g/dL (ref 30.0–36.0)
MCV: 96.4 fL (ref 80.0–100.0)
Platelets: 149 10*3/uL — ABNORMAL LOW (ref 150–400)
RBC: 2.51 MIL/uL — ABNORMAL LOW (ref 4.22–5.81)
RDW: 15.7 % — ABNORMAL HIGH (ref 11.5–15.5)
WBC: 6.9 10*3/uL (ref 4.0–10.5)
nRBC: 0 % (ref 0.0–0.2)

## 2023-07-29 LAB — GLUCOSE, CAPILLARY
Glucose-Capillary: 292 mg/dL — ABNORMAL HIGH (ref 70–99)
Glucose-Capillary: 146 mg/dL — ABNORMAL HIGH (ref 70–99)
Glucose-Capillary: 162 mg/dL — ABNORMAL HIGH (ref 70–99)
Glucose-Capillary: 87 mg/dL (ref 70–99)
Glucose-Capillary: 76 mg/dL (ref 70–99)
Glucose-Capillary: 115 mg/dL — ABNORMAL HIGH (ref 70–99)

## 2023-07-29 LAB — URINALYSIS, ROUTINE W REFLEX MICROSCOPIC
Bilirubin Urine: NEGATIVE
Glucose, UA: 50 mg/dL — AB
Hgb urine dipstick: NEGATIVE
Ketones, ur: 5 mg/dL — AB
Leukocytes,Ua: NEGATIVE
Nitrite: NEGATIVE
Protein, ur: NEGATIVE mg/dL
Specific Gravity, Urine: >1.046 — ABNORMAL HIGH (ref 1.005–1.030)
pH: 5 (ref 5.0–8.0)

## 2023-07-29 MED ORDER — LACTATED RINGERS IV SOLN: INTRAVENOUS | Status: DC

## 2023-07-29 MED ORDER — HYDROMORPHONE HCL 1 MG/ML IJ SOLN
1.0000 mg | Freq: Once | INTRAMUSCULAR | Status: AC
  Administered 2023-07-29: 1 mg via INTRAVENOUS
  Filled 2023-07-29: qty 1

## 2023-07-29 MED ORDER — HYDROMORPHONE HCL 1 MG/ML IJ SOLN
0.5000 mg | INTRAMUSCULAR | Status: DC | PRN
  Administered 2023-07-29 – 2023-08-02 (×6): 0.5 mg via INTRAVENOUS
  Filled 2023-07-29 (×7): qty 0.5

## 2023-07-29 MED ORDER — KCL IN DEXTROSE-NACL 10-5-0.45 MEQ/L-%-% IV SOLN
INTRAVENOUS | Status: AC
  Filled 2023-07-29 (×2): qty 1000

## 2023-07-29 NOTE — Plan of Care (Signed)

## 2023-07-29 NOTE — Progress Notes (Signed)
 PROGRESS NOTE    Troy [name]  FAO:[id number] DOB: [birth year] DOA: 07/28/2023 PCP: [name], MD    Brief Narrative:  Troy [name] is a 68 y.o. male with medical history significant of stage IV hepatocellular carcinoma status post radiation and TREC 2023 currently on chemotherapy, hepatic cirrhosis with esophageal varices, chronic thrombocytopenia, chronic portal vein thrombosis, history of recurrent GI bleed due to hepatic gastropathy, chronic blood loss anemia, insulin-dependent DM type II, essential hypertension, iron deficiency anemia, chronic constipation and chronic hypokalemia presented emergency department complaining of abdominal pain.     Assessment and Plan: Abdominal pain Chronic pain syndrome-cancer-related pain Failure to thrive in due to advanced stage IV hepatocellular carcinoma Stage IV hepatocellular cancer Hepatic cirrhosis with esophageal varices Portal hypertensive gastropathy - Patient presenting with acute on chronic abdominal pain without any nausea and vomiting.  Denies any fever, chill constipation or diarrhea.  Patient has a stage IV liver cell carcinoma currently ongoing radiation therapy.  Last radiation treatment 07/22/2023.  Reported since the treatment patient has significantly worsening abdominal pain. -CT abdomen pelvis showing extensive metastatic lesion and stage IV hepatocellular carcinoma.  New thrombosis of the perigastric and perisplenic varices.  Chronic portal vein thrombosis. -Abdominal pain secondary to cancer related pain. - Continue scheduled MS Contin 50 mg twice daily and continue Norco as needed for breakthrough pain, PRN IV meds -Failure to thrive in adult in setting of advanced cancer.  Consulting palliative team to discuss further goal of care with patient. -  Dr. Parke Poisson to see the patient      Chronic portal vein thrombosis New perigastric and and perisplenic varices -CT abdomen pelvis showing chronic portal vein thrombosis.  New  perigastric and perisplenic varices thrombosis as well. -Given patient has history of chronic blood loss anemia in the context of protal hypertensive gastropathy and and esophageal varices which is limiting the use of anticoagulation in the past.  Discussed case with oncologist Dr. Cherly Hensen, per discussion holding any IV anticoagulation as of now as high risk of bleeding. -Starting low-dose propranolol 20 mg twice daily.  Based on patient heart rate tolerance adjust the dose up to 60 mg twice daily.   Anemia of chronic disease - Hemoglobin is around 10.8 to 8 weeks ago.  Baseline hemoglobin between 8-10.  Continue oral iron supplements. -Continue to monitor H&H in the setting of esophageal and splenic varices.  -CBC in AM  Decompensated hepatic cirrhosis Chronic thrombocytopenia -Same showing chronically elevated AST/ALT.  -trend   Insulin-dependent DM type II -Patient has poor oral intake of food due to abdominal pain.  Holding metformin and Jardiance to prevent hypoglycemia.  Continue long and short acting insulin regimen.   GERD - Continue Protonix and sucralfate.   Chronic constipation -Continue MiraLAX and Senokot.   DVT prophylaxis: SCDs Start: 07/28/23 2347 Place TED hose Start: 07/28/23 2347    Code Status: Full Code Family Communication: at bedside  Disposition Plan:  Level of care: Telemetry Status is: Inpatient     Consultants:     Subjective: Still with abdominal pain  Objective: Vitals:   07/29/23 0041 07/29/23 0110 07/29/23 0444 07/29/23 0822  BP: 130/81 130/81 119/72 127/75  Pulse: (!) 110 (!) 110 86 84  Resp: 18  18 20   Temp: 98.1 F (36.7 C)  98.2 F (36.8 C) 97.8 F (36.6 C)  TempSrc: Oral  Oral Oral  SpO2: 100%  100% 98%  Weight: 58.6 kg     Height: 5\' 10"  (1.778 m)  Intake/Output Summary (Last 24 hours) at 07/29/2023 1228 Last data filed at 07/29/2023 1030 Gross per 24 hour  Intake 500 ml  Output 200 ml  Net 300 ml   Filed Weights    07/28/23 1802 07/29/23 0041  Weight: 58.6 kg 58.6 kg    Examination:   General: Appearance:    Thin male who appears uncomfortable     Lungs:     respirations unlabored  Heart:    Normal heart rate.    MS:   All extremities are intact.    Neurologic:   Awake, alert       Data Reviewed: I have personally reviewed following labs and imaging studies  CBC: Recent Labs  Lab 07/28/23 1925 07/29/23 0626  WBC 5.7 6.9  NEUTROABS 4.6  --   HGB 8.8* 7.6*  HCT 27.5* 24.2*  MCV 96.2 96.4  PLT 172 149*   Basic Metabolic Panel: Recent Labs  Lab 07/28/23 2015 07/29/23 0626  NA 131* 134*  K 4.3 3.8  CL 101 103  CO2 20* 22  GLUCOSE 319* 162*  BUN 22 20  CREATININE 0.96 0.95  CALCIUM 8.8* 8.7*   GFR: Estimated Creatinine Clearance: 60.8 mL/min (by C-G formula based on SCr of 0.95 mg/dL). Liver Function Tests: Recent Labs  Lab 07/28/23 2015 07/29/23 0626  AST 53* 50*  ALT 18 17  ALKPHOS 193* 170*  BILITOT 1.1 0.8  PROT 7.5 6.7  ALBUMIN 2.1* 2.0*   No results for input(s): "LIPASE", "AMYLASE" in the last 168 hours. No results for input(s): "AMMONIA" in the last 168 hours. Coagulation Profile: No results for input(s): "INR", "PROTIME" in the last 168 hours. Cardiac Enzymes: No results for input(s): "CKTOTAL", "CKMB", "CKMBINDEX", "TROPONINI" in the last 168 hours. BNP (last 3 results) No results for input(s): "PROBNP" in the last 8760 hours. HbA1C: No results for input(s): "HGBA1C" in the last 72 hours. CBG: Recent Labs  Lab 07/28/23 1827 07/29/23 0058 07/29/23 0740 07/29/23 0947  GLUCAP 286* 292* 146* 162*   Lipid Profile: No results for input(s): "CHOL", "HDL", "LDLCALC", "TRIG", "CHOLHDL", "LDLDIRECT" in the last 72 hours. Thyroid Function Tests: No results for input(s): "TSH", "T4TOTAL", "FREET4", "T3FREE", "THYROIDAB" in the last 72 hours. Anemia Panel: No results for input(s): "VITAMINB12", "FOLATE", "FERRITIN", "TIBC", "IRON", "RETICCTPCT" in  the last 72 hours. Sepsis Labs: No results for input(s): "PROCALCITON", "LATICACIDVEN" in the last 168 hours.  Recent Results (from the past 240 hours)  Resp panel by RT-PCR (RSV, Flu A&B, Covid) Anterior Nasal Swab     Status: None   Collection Time: 07/28/23  6:41 PM   Specimen: Anterior Nasal Swab  Result Value Ref Range Status   SARS Coronavirus 2 by RT PCR NEGATIVE NEGATIVE Final    Comment: (NOTE) SARS-CoV-2 target nucleic acids are NOT DETECTED.  The SARS-CoV-2 RNA is generally detectable in upper respiratory specimens during the acute phase of infection. The lowest concentration of SARS-CoV-2 viral copies this assay can detect is 138 copies/mL. A negative result does not preclude SARS-Cov-2 infection and should not be used as the sole basis for treatment or other patient management decisions. A negative result may occur with  improper specimen collection/handling, submission of specimen other than nasopharyngeal swab, presence of viral mutation(s) within the areas targeted by this assay, and inadequate number of viral copies(<138 copies/mL). A negative result must be combined with clinical observations, patient history, and epidemiological information. The expected result is Negative.  Fact Sheet for Patients:  BloggerCourse.com  Fact Sheet  for Healthcare Providers:  SeriousBroker.it  This test is no t yet approved or cleared by the [place] and  has been authorized for detection and/or diagnosis of SARS-CoV-2 by FDA under an Emergency Use Authorization (EUA). This EUA will remain  in effect (meaning this test can be used) for the duration of the COVID-19 declaration under Section 564(b)(1) of the Act, 21 U.S.C.section 360bbb-3(b)(1), unless the authorization is terminated  or revoked sooner.       Influenza A by PCR NEGATIVE NEGATIVE Final   Influenza B by PCR NEGATIVE NEGATIVE Final    Comment: (NOTE) The  Xpert Xpress SARS-CoV-2/FLU/RSV plus assay is intended as an aid in the diagnosis of influenza from Nasopharyngeal swab specimens and should not be used as a sole basis for treatment. Nasal washings and aspirates are unacceptable for Xpert Xpress SARS-CoV-2/FLU/RSV testing.  Fact Sheet for Patients: BloggerCourse.com  Fact Sheet for Healthcare Providers: SeriousBroker.it  This test is not yet approved or cleared by the [place] FDA and has been authorized for detection and/or diagnosis of SARS-CoV-2 by FDA under an Emergency Use Authorization (EUA). This EUA will remain in effect (meaning this test can be used) for the duration of the COVID-19 declaration under Section 564(b)(1) of the Act, 21 U.S.C. section 360bbb-3(b)(1), unless the authorization is terminated or revoked.     Resp Syncytial Virus by PCR NEGATIVE NEGATIVE Final    Comment: (NOTE) Fact Sheet for Patients: BloggerCourse.com  Fact Sheet for Healthcare Providers: SeriousBroker.it  This test is not yet approved or cleared by the [place] FDA and has been authorized for detection and/or diagnosis of SARS-CoV-2 by FDA under an Emergency Use Authorization (EUA). This EUA will remain in effect (meaning this test can be used) for the duration of the COVID-19 declaration under Section 564(b)(1) of the Act, 21 U.S.C. section 360bbb-3(b)(1), unless the authorization is terminated or revoked.  Performed at [facility], [address] [place], [place] [address]          Radiology Studies: CT ABDOMEN PELVIS W CONTRAST Result Date: 07/28/2023 CLINICAL DATA:  Lower abdominal pain and swelling status post radiation. Generalized weakness and decreased appetite. History of liver cancer, last treatment on Wednesday. EXAM: CT ABDOMEN AND PELVIS WITH CONTRAST TECHNIQUE: Multidetector CT imaging of  the abdomen and pelvis was performed using the standard protocol following bolus administration of intravenous contrast. RADIATION DOSE REDUCTION: This exam was performed according to the departmental dose-optimization program which includes automated exposure control, adjustment of the mA and/or kV according to patient size and/or use of iterative reconstruction technique. CONTRAST:  OMNIPAQUE IOHEXOL 300 MG/ML  SOLN COMPARISON:  06/11/2023 FINDINGS: Lower chest: Bibasilar atelectasis/scarring. Retained metallic pellets in the lung bases. Hepatobiliary: Cirrhosis. Similar size of the mass centered in the left lobe of the liver measuring approximately 6 x 9 cm. Decompressed gallbladder. No biliary dilation. Pancreas: No ductal dilation or inflammatory changes. Spleen: Unremarkable. Adrenals/Urinary Tract: Unremarkable adrenal glands and kidneys. Unremarkable bladder. Stomach/Bowel: No bowel obstruction. Wall thickening about the small bowel and colon favored due to portal congestive colopathy/enteropathy. Normal appendix. Stomach is within normal limits. Vascular/Lymphatic: Extensive mesenteric and retroperitoneal adenopathy. Index lymph nodes as follows: Left periaortic node (series 2/image 42) measures 7.2 x 5.6 cm, previously 7.8 x 6.1 cm; Left periaortic node (series 2/image 35) measures 5.4 x 4.3 cm, previously 5.2 x 4.3 cm; Porta hepatis node (series 2/image 34) measures 5.4 x 4.9 cm, previously 5.0 x 4.1 cm. Multiple peritoneal metastases  are new since 06/11/2023. For example 1.3 cm nodule anterior to the gastric body (series 2/image 28). Unchanged size of the right rectus sheath metastasis measuring 2.8 x 3.3 cm. 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 Multiple varices contain nonocclusive thrombus. For example in a perigastric varix (series 7/image 60, a splenic varix (7/71-77), and the left  renal vein (7/65). Reproductive: No acute abnormality. Other: Large volume low-density abdominopelvic ascites. No free intraperitoneal air. No abscess. Musculoskeletal: No acute fracture or destructive osseous lesion. IMPRESSION: 1. Cirrhosis with similar size of the mass centered in the left lobe of the liver. 2. Extensive mesenteric and retroperitoneal adenopathy, some of which are slightly decreased in size while others are slightly increased in size. 3. 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 4. 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 New thrombus in the perigastric and perisplenic varices. 5. Wall thickening about the small bowel and colon favored due to portal congestive colopathy/enteropathy. 6. Large volume low-density abdominopelvic ascites. These results were called by telephone at the time of interpretation on 07/28/2023 at 10:28 pm to provider [facility] , who verbally acknowledged these results. Electronically Signed   By: Minerva Fester M.D.   On: 07/28/2023 22:32   DG Chest Portable 1 View Result Date: 07/28/2023 CLINICAL DATA:  History of diabetes and liver cancer, presenting with generalized weakness. EXAM: PORTABLE CHEST 1 VIEW COMPARISON:  June 11, 2023 FINDINGS: The right-sided venous Port-A-Cath seen on the prior study has been removed. The heart size and mediastinal contours are within normal limits. Low lung volumes are noted. A stable subcentimeter calcified lung nodule is seen within the periphery of the right lung base. There is no evidence of acute infiltrate, pleural effusion or pneumothorax. Numerous tiny radiopaque foreign bodies are again seen overlying the left upper quadrant. The visualized skeletal structures are unremarkable. IMPRESSION: Low lung volumes without evidence of acute or active cardiopulmonary disease. Electronically Signed   By: Aram Candela M.D.   On: 07/28/2023 19:51        Scheduled  Meds:  dicyclomine  10 mg Oral TID AC & HS   ferrous sulfate  325 mg Oral Q breakfast   insulin aspart  0-15 Units Subcutaneous TID PC,HS,0200   insulin glargine  15 Units Subcutaneous Daily   lisinopril  20 mg Oral Daily   morphine  15 mg Oral Q12H   pantoprazole  40 mg Oral BID AC   polyethylene glycol  17 g Oral Daily   propranolol  20 mg Oral BID   senna-docusate  1 tablet Oral BID   sodium chloride flush  3 mL Intravenous Q12H   sodium chloride flush  3 mL Intravenous Q12H   sucralfate  1 g Oral BID   Continuous Infusions:  sodium chloride     lactated ringers 75 mL/hr at 07/29/23 0109     LOS: 1 day    Time spent: 45 minutes spent on chart review, discussion with nursing staff, consultants, updating family and interview/physical exam; more than 50% of that time was spent in counseling and/or coordination of care.    Joseph Art, DO [facility] Available via Epic secure chat 7am-7pm After these hours, please refer to coverage provider listed on [email or web address] 07/29/2023, 12:28 PM

## 2023-07-29 NOTE — Progress Notes (Signed)
 Oncology note   I was informed about patient's ED visit last night, but did not know he was admitted to [facility], until his floor nurse messaged me around 7pm today.  I called patient's wife [name] and spoke with her on the phone.  I reviewed his CT scan from yesterday, which showed large amount of ascites.  Patient complains of abdominal pain and distention, I will order paracentesis for him tomorrow. [name] is somewhat lost and not sure what's next step will be.  I briefly discussed hospice care upon his discharge, and plan to reviewed again with patient and his wife in person tomorrow.  I will also inform palliative care team to follow-up with him in the hospital.  All questions were answered.  [name] MD 07/29/2023

## 2023-07-30 ENCOUNTER — Inpatient Hospital Stay (HOSPITAL_COMMUNITY)

## 2023-07-30 DIAGNOSIS — Z7189 Other specified counseling: Secondary | ICD-10-CM | POA: Diagnosis not present

## 2023-07-30 DIAGNOSIS — E860 Dehydration: Secondary | ICD-10-CM

## 2023-07-30 DIAGNOSIS — C220 Liver cell carcinoma: Secondary | ICD-10-CM | POA: Diagnosis not present

## 2023-07-30 DIAGNOSIS — Z515 Encounter for palliative care: Secondary | ICD-10-CM | POA: Diagnosis not present

## 2023-07-30 DIAGNOSIS — R627 Adult failure to thrive: Secondary | ICD-10-CM

## 2023-07-30 LAB — BODY FLUID CELL COUNT WITH DIFFERENTIAL
Eos, Fluid: 0 %
Lymphs, Fluid: 2 %
Monocyte-Macrophage-Serous Fluid: 53 % (ref 50–90)
Neutrophil Count, Fluid: 45 % — ABNORMAL HIGH (ref 0–25)
Total Nucleated Cell Count, Fluid: 919 uL (ref 0–1000)

## 2023-07-30 LAB — BASIC METABOLIC PANEL
Anion gap: 7 (ref 5–15)
BUN: 26 mg/dL — ABNORMAL HIGH (ref 8–23)
CO2: 23 mmol/L (ref 22–32)
Calcium: 9 mg/dL (ref 8.9–10.3)
Chloride: 102 mmol/L (ref 98–111)
Creatinine, Ser: 1.41 mg/dL — ABNORMAL HIGH (ref 0.61–1.24)
GFR, Estimated: 54 mL/min — ABNORMAL LOW (ref >60)
Glucose, Bld: 182 mg/dL — ABNORMAL HIGH (ref 70–99)
Potassium: 4.2 mmol/L (ref 3.5–5.1)
Sodium: 132 mmol/L — ABNORMAL LOW (ref 135–145)

## 2023-07-30 LAB — CBC
HCT: 26.4 % — ABNORMAL LOW (ref 39.0–52.0)
Hemoglobin: 8.3 g/dL — ABNORMAL LOW (ref 13.0–17.0)
MCH: 30.4 pg (ref 26.0–34.0)
MCHC: 31.4 g/dL (ref 30.0–36.0)
MCV: 96.7 fL (ref 80.0–100.0)
Platelets: 160 10*3/uL (ref 150–400)
RBC: 2.73 MIL/uL — ABNORMAL LOW (ref 4.22–5.81)
RDW: 15.9 % — ABNORMAL HIGH (ref 11.5–15.5)
WBC: 7.6 10*3/uL (ref 4.0–10.5)
nRBC: 0 % (ref 0.0–0.2)

## 2023-07-30 LAB — GLUCOSE, CAPILLARY
Glucose-Capillary: 145 mg/dL — ABNORMAL HIGH (ref 70–99)
Glucose-Capillary: 173 mg/dL — ABNORMAL HIGH (ref 70–99)
Glucose-Capillary: 146 mg/dL — ABNORMAL HIGH (ref 70–99)
Glucose-Capillary: 149 mg/dL — ABNORMAL HIGH (ref 70–99)
Glucose-Capillary: 155 mg/dL — ABNORMAL HIGH (ref 70–99)

## 2023-07-30 MED ORDER — ALBUMIN HUMAN 25 % IV SOLN
25.0000 g | Freq: Four times a day (QID) | INTRAVENOUS | Status: AC
  Administered 2023-07-30 – 2023-07-31 (×4): 25 g via INTRAVENOUS
  Filled 2023-07-30 (×5): qty 100

## 2023-07-30 MED ORDER — INSULIN ASPART 100 UNIT/ML IJ SOLN
0.0000 [IU] | Freq: Every day | INTRAMUSCULAR | Status: DC
  Administered 2023-08-04: 2 [IU] via SUBCUTANEOUS

## 2023-07-30 MED ORDER — INSULIN ASPART 100 UNIT/ML IJ SOLN
0.0000 [IU] | Freq: Three times a day (TID) | INTRAMUSCULAR | Status: DC
Start: 2023-07-30 — End: 2023-08-05
  Administered 2023-07-31 – 2023-08-03 (×9): 1 [IU] via SUBCUTANEOUS
  Administered 2023-08-04 – 2023-08-05 (×4): 2 [IU] via SUBCUTANEOUS

## 2023-07-30 MED ORDER — LIDOCAINE HCL 1 % IJ SOLN
INTRAMUSCULAR | Status: AC
  Filled 2023-07-30: qty 20

## 2023-07-30 MED ORDER — CARMEX CLASSIC LIP BALM EX OINT
TOPICAL_OINTMENT | CUTANEOUS | Status: DC | PRN
  Filled 2023-07-30: qty 10

## 2023-07-30 MED ORDER — TAMSULOSIN HCL 0.4 MG PO CAPS
0.4000 mg | ORAL_CAPSULE | Freq: Every day | ORAL | Status: DC
  Administered 2023-07-30 – 2023-08-04 (×6): 0.4 mg via ORAL
  Filled 2023-07-30 (×6): qty 1
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
## 2023-07-30 NOTE — Progress Notes (Addendum)
 Troy [name]   DOB:1957-09-70   YQ#:[id number]   XBM#:[id number]  Medical oncology follow-up note  Subjective: Patient is well-known to me, under my care for his metastatic liver cancer.  He is on supportive care alone.  He presented to hospital 2 days ago with worsening abdominal distention and pain.  He was found to have large volume ascites, which is new.  I ordered a paracentesis, and he had a 2 and half liter bloody fluid removed today.  He feels better after procedure.  Paracentesis was stopped due to hypotension.  Wife at bedside   Objective:  Vitals:   07/30/23 1503 07/30/23 1531  BP: 94/61 (!) 94/57  Pulse: 86 84  Resp: 18 17  Temp: 98 F (36.7 C) 98.1 F (36.7 C)  SpO2: 97% 97%    Body mass index is 18.54 kg/m.  Intake/Output Summary (Last 24 hours) at 07/30/2023 1604 Last data filed at 07/30/2023 1540 Gross per 24 hour  Intake 1712.72 ml  Output 200 ml  Net 1512.72 ml     Sclerae unicteric  Oropharynx clear  No peripheral adenopathy  Lungs clear -- no rales or rhonchi  Heart regular rate and rhythm  Abdomen soft, not distended, mild diffuse tenderness.  MSK no focal spinal tenderness, no peripheral edema  Neuro nonfocal    CBG (last 3)  Recent Labs    07/30/23 0154 07/30/23 0746 07/30/23 1103  GLUCAP 145* 173* 146*     Labs:  Lab Results  Component Value Date   WBC 7.6 07/30/2023   HGB 8.3 (L) 07/30/2023   HCT 26.4 (L) 07/30/2023   MCV 96.7 07/30/2023   PLT 160 07/30/2023   NEUTROABS 4.6 07/28/2023    Urine Studies No results for input(s): "UHGB", "CRYS" in the last 72 hours.  Invalid input(s): "UACOL", "UAPR", "USPG", "UPH", "UTP", "UGL", "UKET", "UBIL", "UNIT", "UROB", "ULEU", "UEPI", "UWBC", "URBC", "UBAC", "CAST", "UCOM", "BILUA"  Basic Metabolic Panel: Recent Labs  Lab 07/28/23 2015 07/29/23 0626 07/30/23 0607  NA 131* 134* 132*  K 4.3 3.8 4.2  CL 101 103 102  CO2 20* 22 23  GLUCOSE 319* 162* 182*  BUN 22 20 26*  CREATININE 0.96  0.95 1.41*  CALCIUM 8.8* 8.7* 9.0   GFR Estimated Creatinine Clearance: 41 mL/min (A) (by C-G formula based on SCr of 1.41 mg/dL (H)). Liver Function Tests: Recent Labs  Lab 07/28/23 2015 07/29/23 0626  AST 53* 50*  ALT 18 17  ALKPHOS 193* 170*  BILITOT 1.1 0.8  PROT 7.5 6.7  ALBUMIN 2.1* 2.0*   No results for input(s): "LIPASE", "AMYLASE" in the last 168 hours. No results for input(s): "AMMONIA" in the last 168 hours. Coagulation profile No results for input(s): "INR", "PROTIME" in the last 168 hours.  CBC: Recent Labs  Lab 07/28/23 1925 07/29/23 0626 07/30/23 0607  WBC 5.7 6.9 7.6  NEUTROABS 4.6  --   --   HGB 8.8* 7.6* 8.3*  HCT 27.5* 24.2* 26.4*  MCV 96.2 96.4 96.7  PLT 172 149* 160   Cardiac Enzymes: No results for input(s): "CKTOTAL", "CKMB", "CKMBINDEX", "TROPONINI" in the last 168 hours. BNP: Invalid input(s): "POCBNP" CBG: Recent Labs  Lab 07/29/23 1651 07/29/23 2114 07/30/23 0154 07/30/23 0746 07/30/23 1103  GLUCAP 76 115* 145* 173* 146*   D-Dimer No results for input(s): "DDIMER" in the last 72 hours. Hgb A1c No results for input(s): "HGBA1C" in the last 72 hours. Lipid Profile No results for input(s): "CHOL", "HDL", "LDLCALC", "TRIG", "CHOLHDL", "LDLDIRECT" in the  last 72 hours. Thyroid function studies No results for input(s): "TSH", "T4TOTAL", "T3FREE", "THYROIDAB" in the last 72 hours.  Invalid input(s): "FREET3" Anemia work up No results for input(s): "VITAMINB12", "FOLATE", "FERRITIN", "TIBC", "IRON", "RETICCTPCT" in the last 72 hours. Microbiology Recent Results (from the past 240 hours)  Resp panel by RT-PCR (RSV, Flu A&B, Covid) Anterior Nasal Swab     Status: None   Collection Time: 07/28/23  6:41 PM   Specimen: Anterior Nasal Swab  Result Value Ref Range Status   SARS Coronavirus 2 by RT PCR NEGATIVE NEGATIVE Final    Comment: (NOTE) SARS-CoV-2 target nucleic acids are NOT DETECTED.  The SARS-CoV-2 RNA is generally  detectable in upper respiratory specimens during the acute phase of infection. The lowest concentration of SARS-CoV-2 viral copies this assay can detect is 138 copies/mL. A negative result does not preclude SARS-Cov-2 infection and should not be used as the sole basis for treatment or other patient management decisions. A negative result may occur with  improper specimen collection/handling, submission of specimen other than nasopharyngeal swab, presence of viral mutation(s) within the areas targeted by this assay, and inadequate number of viral copies(<138 copies/mL). A negative result must be combined with clinical observations, patient history, and epidemiological information. The expected result is Negative.  Fact Sheet for Patients:  BloggerCourse.com  Fact Sheet for Healthcare Providers:  SeriousBroker.it  This test is no t yet approved or cleared by the [place] FDA and  has been authorized for detection and/or diagnosis of SARS-CoV-2 by FDA under an Emergency Use Authorization (EUA). This EUA will remain  in effect (meaning this test can be used) for the duration of the COVID-19 declaration under Section 564(b)(1) of the Act, 21 U.S.C.section 360bbb-3(b)(1), unless the authorization is terminated  or revoked sooner.       Influenza A by PCR NEGATIVE NEGATIVE Final   Influenza B by PCR NEGATIVE NEGATIVE Final    Comment: (NOTE) The Xpert Xpress SARS-CoV-2/FLU/RSV plus assay is intended as an aid in the diagnosis of influenza from Nasopharyngeal swab specimens and should not be used as a sole basis for treatment. Nasal washings and aspirates are unacceptable for Xpert Xpress SARS-CoV-2/FLU/RSV testing.  Fact Sheet for Patients: BloggerCourse.com  Fact Sheet for Healthcare Providers: SeriousBroker.it  This test is not yet approved or cleared by the [place] FDA  and has been authorized for detection and/or diagnosis of SARS-CoV-2 by FDA under an Emergency Use Authorization (EUA). This EUA will remain in effect (meaning this test can be used) for the duration of the COVID-19 declaration under Section 564(b)(1) of the Act, 21 U.S.C. section 360bbb-3(b)(1), unless the authorization is terminated or revoked.     Resp Syncytial Virus by PCR NEGATIVE NEGATIVE Final    Comment: (NOTE) Fact Sheet for Patients: BloggerCourse.com  Fact Sheet for Healthcare Providers: SeriousBroker.it  This test is not yet approved or cleared by the [place] FDA and has been authorized for detection and/or diagnosis of SARS-CoV-2 by FDA under an Emergency Use Authorization (EUA). This EUA will remain in effect (meaning this test can be used) for the duration of the COVID-19 declaration under Section 564(b)(1) of the Act, 21 U.S.C. section 360bbb-3(b)(1), unless the authorization is terminated or revoked.  Performed at [facility], [address], [place], [place] [address]   Body fluid culture w Gram Stain     Status: None (Preliminary result)   Collection Time: 07/30/23 12:24 PM   Specimen: PATH Cytology Peritoneal fluid  Result Value Ref Range Status   Specimen Description   Final    PERITONEAL Performed at [facility], [address] [place], [place] [address]    Special Requests   Final    NONE Performed at [facility], [address] [place], [place] [address]    Gram Stain   Final    FEW WBC PRESENT, PREDOMINANTLY PMN NO ORGANISMS SEEN Performed at [facility], [address], [place], [place] [address]    Culture PENDING  Incomplete   Report Status PENDING  Incomplete      Studies:  US Paracentesis Result Date: 07/30/2023 INDICATION: Patient with history of stage IV hepatocellular carcinoma, cirrhosis, chronic portal vein  thrombosis, anemia, acute kidney injury, ascites. Request received for diagnostic and therapeutic paracentesis. EXAM: ULTRASOUND GUIDED DIAGNOSTIC AND THERAPEUTIC PARACENTESIS MEDICATIONS: 6 mL 1% lidocaine COMPLICATIONS: None immediate. PROCEDURE: Informed written consent was obtained from the patient after a discussion of the risks, benefits and alternatives to treatment. A timeout was performed prior to the initiation of the procedure. Initial ultrasound scanning demonstrates a moderate amount of ascites within the right mid to lower abdominal quadrant. The right mid to lower abdomen was prepped and draped in the usual sterile fashion. 1% lidocaine was used for local anesthesia. Following this, a 19 gauge, 7-cm, Yueh catheter was introduced. An ultrasound image was saved for documentation purposes. The paracentesis was performed. The catheter was removed and a dressing was applied. The patient tolerated the procedure well without immediate post procedural complication. FINDINGS: A total of approximately 2.4 liters of bloody fluid was removed. Samples were sent to the laboratory as requested by the clinical team. IMPRESSION: Successful ultrasound-guided diagnostic and therapeutic paracentesis yielding 2.4 liters of peritoneal fluid. Performed by: Jeananne Rama, PA-C Electronically Signed   By: Judie Petit.  Shick M.D.   On: 07/30/2023 14:28   CT ABDOMEN PELVIS W CONTRAST Result Date: 07/28/2023 CLINICAL DATA:  Lower abdominal pain and swelling status post radiation. Generalized weakness and decreased appetite. History of liver cancer, last treatment on Wednesday. EXAM: CT ABDOMEN AND PELVIS WITH CONTRAST TECHNIQUE: Multidetector CT imaging of the abdomen and pelvis was performed using the standard protocol following bolus administration of intravenous contrast. RADIATION DOSE REDUCTION: This exam was performed according to the departmental dose-optimization program which includes automated exposure control, adjustment of  the mA and/or kV according to patient size and/or use of iterative reconstruction technique. CONTRAST:  OMNIPAQUE IOHEXOL 300 MG/ML  SOLN COMPARISON:  06/11/2023 FINDINGS: Lower chest: Bibasilar atelectasis/scarring. Retained metallic pellets in the lung bases. Hepatobiliary: Cirrhosis. Similar size of the mass centered in the left lobe of the liver measuring approximately 6 x 9 cm. Decompressed gallbladder. No biliary dilation. Pancreas: No ductal dilation or inflammatory changes. Spleen: Unremarkable. Adrenals/Urinary Tract: Unremarkable adrenal glands and kidneys. Unremarkable bladder. Stomach/Bowel: No bowel obstruction. Wall thickening about the small bowel and colon favored due to portal congestive colopathy/enteropathy. Normal appendix. Stomach is within normal limits. Vascular/Lymphatic: Extensive mesenteric and retroperitoneal adenopathy. Index lymph nodes as follows: Left periaortic node (series 2/image 42) measures 7.2 x 5.6 cm, previously 7.8 x 6.1 cm; Left periaortic node (series 2/image 35) measures 5.4 x 4.3 cm, previously 5.2 x 4.3 cm; Porta hepatis node (series 2/image 34) measures 5.4 x 4.9 cm, previously 5.0 x 4.1 cm. 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 For example 1.3 cm nodule anterior to the gastric body (series 2/image 28). Unchanged size of the right rectus sheath metastasis measuring  2.8 x 3.3 cm. 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 Multiple varices contain nonocclusive thrombus. For example in a perigastric varix (series 7/image 60, a splenic varix (7/71-77), and the left renal vein (7/65). Reproductive: No acute abnormality. Other: Large volume low-density abdominopelvic ascites. No free intraperitoneal air. No abscess. Musculoskeletal: No acute fracture or destructive osseous lesion. IMPRESSION: 1. Cirrhosis with similar size of the mass centered  in the left lobe of the liver. 2. Extensive mesenteric and retroperitoneal adenopathy, some of which are slightly decreased in size while others are slightly increased in size. 3. 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 4. 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 New thrombus in the perigastric and perisplenic varices. 5. Wall thickening about the small bowel and colon favored due to portal congestive colopathy/enteropathy. 6. Large volume low-density abdominopelvic ascites. These results were called by telephone at the time of interpretation on 07/28/2023 at 10:28 pm to provider [facility] , who verbally acknowledged these results. Electronically Signed   By: Minerva Fester M.D.   On: 07/28/2023 22:32   DG Chest Portable 1 View Result Date: 07/28/2023 CLINICAL DATA:  History of diabetes and liver cancer, presenting with generalized weakness. EXAM: PORTABLE CHEST 1 VIEW COMPARISON:  June 11, 2023 FINDINGS: The right-sided venous Port-A-Cath seen on the prior study has been removed. The heart size and mediastinal contours are within normal limits. Low lung volumes are noted. A stable subcentimeter calcified lung nodule is seen within the periphery of the right lung base. There is no evidence of acute infiltrate, pleural effusion or pneumothorax. Numerous tiny radiopaque foreign bodies are again seen overlying the left upper quadrant. The visualized skeletal structures are unremarkable. IMPRESSION: Low lung volumes without evidence of acute or active cardiopulmonary disease. Electronically Signed   By: Aram Candela M.D.   On: 07/28/2023 19:51    Assessment: 68 y.o. 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  He had a 2.5 L fluid removed, stopped due to hypotension. -Due to his rapid cancer progression, and significantly decreased performance status, and multiple cancer related symptoms, I again recommend hospice home care.  I discussed the logistics, benefit and limitation of hospice care.  Patient and his wife are still reluctant, but will think about it. -Palliative care medicine Dr. Linna Darner will see him today. -I will follow-up on Monday if he is doing hospital. I spent a total of 50 minutes for his visit today, more than 50% time on face-to-face counseling.  [name], MD 07/30/2023

## 2023-07-30 NOTE — Progress Notes (Signed)
 Patient bladder scanned and it revealed 434 ccs. MD notified and placed order for in and out catheter. This RN attempted x2 and another urology RN attempted x2 and could not get catheter to pass. MD notified and placed orders for Flomax to be scheduled since patient did urinate on his own earlier today. Patient made aware and verbalized understanding. Will pass along to night shift RN to monitor and continue to bladder scan q 8hrs per orders.

## 2023-07-30 NOTE — Progress Notes (Signed)
 PROGRESS NOTE    Troy [name]  ION:[id number] DOB: 07-28-1955 DOA: 07/28/2023 PCP: [name], MD    Brief Narrative:  Troy [name] is a 68 y.o. male with medical history significant of stage IV hepatocellular carcinoma status post radiation and TREC 2023 currently on chemotherapy, hepatic cirrhosis with esophageal varices, chronic thrombocytopenia, chronic portal vein thrombosis, history of recurrent GI bleed due to hepatic gastropathy, chronic blood loss anemia, insulin-dependent DM type II, essential hypertension, iron deficiency anemia, chronic constipation and chronic hypokalemia presented emergency department complaining of abdominal pain.     Assessment and Plan: Abdominal pain Chronic pain syndrome-cancer-related pain Failure to thrive in due to advanced stage IV hepatocellular carcinoma Stage IV hepatocellular cancer Hepatic cirrhosis with esophageal varices Portal hypertensive gastropathy - Patient presenting with acute on chronic abdominal pain without any nausea and vomiting.  Denies any fever, chill constipation or diarrhea.  Patient has a stage IV liver cell carcinoma currently ongoing radiation therapy.  Last radiation treatment 07/22/2023.  Reported since the treatment patient has significantly worsening abdominal pain. -CT abdomen pelvis showing extensive metastatic lesion and stage IV hepatocellular carcinoma.  New thrombosis of the perigastric and perisplenic varices.  Chronic portal vein thrombosis. -? Need for thoracentesis-- start IV albumin for 24 hours -transfer to progressive care for low BP - Continue scheduled MS Contin 50 mg twice daily and continue Norco as needed for breakthrough pain, PRN IV meds -Failure to thrive in adult in setting of advanced cancer.  Consulting palliative team to discuss further goal of care with patient. -  Dr. Parke Poisson to see the patient      Chronic portal vein thrombosis New perigastric and and perisplenic varices -CT abdomen pelvis  showing chronic portal vein thrombosis.  New perigastric and perisplenic varices thrombosis as well. -Given patient has history of chronic blood loss anemia in the context of protal hypertensive gastropathy and and esophageal varices which is limiting the use of anticoagulation in the past.  Discussed case with oncologist Dr. Cherly Hensen, per discussion holding any IV anticoagulation as of now as high risk of bleeding. -unable to tolerate the  low-dose propranolol 20 mg twice daily due to hypotension   Anemia of chronic disease - Hemoglobin is around 10.8 to 8 weeks ago.  Baseline hemoglobin between 8-10.  Continue oral iron supplements. -Continue to monitor H&H in the setting of esophageal and splenic varices. -trend  AKI -IVF -recheck labs in the AM -bladder scan to r/o obstruction Strict I/Os  Decompensated hepatic cirrhosis Chronic thrombocytopenia -Same showing chronically elevated AST/ALT.  -trend   Insulin-dependent DM type II -Patient has poor oral intake of food due to abdominal pain.  Holding metformin and Jardiance to prevent hypoglycemia.' -use very sensitive SSI   GERD - Continue Protonix and sucralfate.   Chronic constipation -Continue MiraLAX and Senokot.   DVT prophylaxis: SCDs Start: 07/28/23 2347 Place TED hose Start: 07/28/23 2347    Code Status: Full Code Family Communication: at bedside  Disposition Plan:  Level of care: Progressive Status is: Inpatient     Consultants:  Oncology IR- paracentesis  Subjective: C/o pain in lower abdomen   Objective: Vitals:   07/29/23 2112 07/30/23 0456 07/30/23 1029 07/30/23 1231  BP: 114/77 98/65 97/67  (!) 85/57  Pulse: 88 86    Resp: 14 14 20    Temp: 98.4 F (36.9 C) 97.9 F (36.6 C)    TempSrc: Oral Oral    SpO2: 98% 98%    Weight:      Height:  Intake/Output Summary (Last 24 hours) at 07/30/2023 1248 Last data filed at 07/30/2023 0636 Gross per 24 hour  Intake 936.25 ml  Output 200 ml  Net  736.25 ml   Filed Weights   07/28/23 1802 07/29/23 0041  Weight: 58.6 kg 58.6 kg    Examination:   General: Appearance:    Thin male who appears uncomfortable     Lungs:     respirations unlabored  Heart:    Normal heart rate.    MS:   All extremities are intact.    Neurologic:   Awake, alert       Data Reviewed: I have personally reviewed following labs and imaging studies  CBC: Recent Labs  Lab 07/28/23 1925 07/29/23 0626 07/30/23 0607  WBC 5.7 6.9 7.6  NEUTROABS 4.6  --   --   HGB 8.8* 7.6* 8.3*  HCT 27.5* 24.2* 26.4*  MCV 96.2 96.4 96.7  PLT 172 149* 160   Basic Metabolic Panel: Recent Labs  Lab 07/28/23 2015 07/29/23 0626 07/30/23 0607  NA 131* 134* 132*  K 4.3 3.8 4.2  CL 101 103 102  CO2 20* 22 23  GLUCOSE 319* 162* 182*  BUN 22 20 26*  CREATININE 0.96 0.95 1.41*  CALCIUM 8.8* 8.7* 9.0   GFR: Estimated Creatinine Clearance: 41 mL/min (A) (by C-G formula based on SCr of 1.41 mg/dL (H)). Liver Function Tests: Recent Labs  Lab 07/28/23 2015 07/29/23 0626  AST 53* 50*  ALT 18 17  ALKPHOS 193* 170*  BILITOT 1.1 0.8  PROT 7.5 6.7  ALBUMIN 2.1* 2.0*   No results for input(s): "LIPASE", "AMYLASE" in the last 168 hours. No results for input(s): "AMMONIA" in the last 168 hours. Coagulation Profile: No results for input(s): "INR", "PROTIME" in the last 168 hours. Cardiac Enzymes: No results for input(s): "CKTOTAL", "CKMB", "CKMBINDEX", "TROPONINI" in the last 168 hours. BNP (last 3 results) No results for input(s): "PROBNP" in the last 8760 hours. HbA1C: No results for input(s): "HGBA1C" in the last 72 hours. CBG: Recent Labs  Lab 07/29/23 1651 07/29/23 2114 07/30/23 0154 07/30/23 0746 07/30/23 1103  GLUCAP 76 115* 145* 173* 146*   Lipid Profile: No results for input(s): "CHOL", "HDL", "LDLCALC", "TRIG", "CHOLHDL", "LDLDIRECT" in the last 72 hours. Thyroid Function Tests: No results for input(s): "TSH", "T4TOTAL", "FREET4",  "T3FREE", "THYROIDAB" in the last 72 hours. Anemia Panel: No results for input(s): "VITAMINB12", "FOLATE", "FERRITIN", "TIBC", "IRON", "RETICCTPCT" in the last 72 hours. Sepsis Labs: No results for input(s): "PROCALCITON", "LATICACIDVEN" in the last 168 hours.  Recent Results (from the past 240 hours)  Resp panel by RT-PCR (RSV, Flu A&B, Covid) Anterior Nasal Swab     Status: None   Collection Time: 07/28/23  6:41 PM   Specimen: Anterior Nasal Swab  Result Value Ref Range Status   SARS Coronavirus 2 by RT PCR NEGATIVE NEGATIVE Final    Comment: (NOTE) SARS-CoV-2 target nucleic acids are NOT DETECTED.  The SARS-CoV-2 RNA is generally detectable in upper respiratory specimens during the acute phase of infection. The lowest concentration of SARS-CoV-2 viral copies this assay can detect is 138 copies/mL. A negative result does not preclude SARS-Cov-2 infection and should not be used as the sole basis for treatment or other patient management decisions. A negative result may occur with  improper specimen collection/handling, submission of specimen other than nasopharyngeal swab, presence of viral mutation(s) within the areas targeted by this assay, and inadequate number of viral copies(<138 copies/mL). A negative result must  be combined with clinical observations, patient history, and epidemiological information. The expected result is Negative.  Fact Sheet for Patients:  BloggerCourse.com  Fact Sheet for Healthcare Providers:  SeriousBroker.it  This test is no t yet approved or cleared by the [place] FDA and  has been authorized for detection and/or diagnosis of SARS-CoV-2 by FDA under an Emergency Use Authorization (EUA). This EUA will remain  in effect (meaning this test can be used) for the duration of the COVID-19 declaration under Section 564(b)(1) of the Act, 21 U.S.C.section 360bbb-3(b)(1), unless the authorization is  terminated  or revoked sooner.       Influenza A by PCR NEGATIVE NEGATIVE Final   Influenza B by PCR NEGATIVE NEGATIVE Final    Comment: (NOTE) The Xpert Xpress SARS-CoV-2/FLU/RSV plus assay is intended as an aid in the diagnosis of influenza from Nasopharyngeal swab specimens and should not be used as a sole basis for treatment. Nasal washings and aspirates are unacceptable for Xpert Xpress SARS-CoV-2/FLU/RSV testing.  Fact Sheet for Patients: BloggerCourse.com  Fact Sheet for Healthcare Providers: SeriousBroker.it  This test is not yet approved or cleared by the [place] FDA and has been authorized for detection and/or diagnosis of SARS-CoV-2 by FDA under an Emergency Use Authorization (EUA). This EUA will remain in effect (meaning this test can be used) for the duration of the COVID-19 declaration under Section 564(b)(1) of the Act, 21 U.S.C. section 360bbb-3(b)(1), unless the authorization is terminated or revoked.     Resp Syncytial Virus by PCR NEGATIVE NEGATIVE Final    Comment: (NOTE) Fact Sheet for Patients: BloggerCourse.com  Fact Sheet for Healthcare Providers: SeriousBroker.it  This test is not yet approved or cleared by the [place] FDA and has been authorized for detection and/or diagnosis of SARS-CoV-2 by FDA under an Emergency Use Authorization (EUA). This EUA will remain in effect (meaning this test can be used) for the duration of the COVID-19 declaration under Section 564(b)(1) of the Act, 21 U.S.C. section 360bbb-3(b)(1), unless the authorization is terminated or revoked.  Performed at [facility], [address] [place], [place] [address]          Radiology Studies: CT ABDOMEN PELVIS W CONTRAST Result Date: 07/28/2023 CLINICAL DATA:  Lower abdominal pain and swelling status post radiation. Generalized weakness and  decreased appetite. History of liver cancer, last treatment on Wednesday. EXAM: CT ABDOMEN AND PELVIS WITH CONTRAST TECHNIQUE: Multidetector CT imaging of the abdomen and pelvis was performed using the standard protocol following bolus administration of intravenous contrast. RADIATION DOSE REDUCTION: This exam was performed according to the departmental dose-optimization program which includes automated exposure control, adjustment of the mA and/or kV according to patient size and/or use of iterative reconstruction technique. CONTRAST:  OMNIPAQUE IOHEXOL 300 MG/ML  SOLN COMPARISON:  06/11/2023 FINDINGS: Lower chest: Bibasilar atelectasis/scarring. Retained metallic pellets in the lung bases. Hepatobiliary: Cirrhosis. Similar size of the mass centered in the left lobe of the liver measuring approximately 6 x 9 cm. Decompressed gallbladder. No biliary dilation. Pancreas: No ductal dilation or inflammatory changes. Spleen: Unremarkable. Adrenals/Urinary Tract: Unremarkable adrenal glands and kidneys. Unremarkable bladder. Stomach/Bowel: No bowel obstruction. Wall thickening about the small bowel and colon favored due to portal congestive colopathy/enteropathy. Normal appendix. Stomach is within normal limits. Vascular/Lymphatic: Extensive mesenteric and retroperitoneal adenopathy. Index lymph nodes as follows: Left periaortic node (series 2/image 42) measures 7.2 x 5.6 cm, previously 7.8 x 6.1 cm; Left periaortic node (series 2/image 35) measures 5.4 x 4.3  cm, previously 5.2 x 4.3 cm; Porta hepatis node (series 2/image 34) measures 5.4 x 4.9 cm, previously 5.0 x 4.1 cm. 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 For example 1.3 cm nodule anterior to the gastric body (series 2/image 28). Unchanged size of the right rectus sheath metastasis measuring 2.8 x 3.3 cm. 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 Large varices are seen surrounding the stomach and  distal esophagus. Multiple varices contain nonocclusive thrombus. For example in a perigastric varix (series 7/image 60, a splenic varix (7/71-77), and the left renal vein (7/65). Reproductive: No acute abnormality. Other: Large volume low-density abdominopelvic ascites. No free intraperitoneal air. No abscess. Musculoskeletal: No acute fracture or destructive osseous lesion. IMPRESSION: 1. Cirrhosis with similar size of the mass centered in the left lobe of the liver. 2. Extensive mesenteric and retroperitoneal adenopathy, some of which are slightly decreased in size while others are slightly increased in size. 3. 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 4. 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 New thrombus in the perigastric and perisplenic varices. 5. Wall thickening about the small bowel and colon favored due to portal congestive colopathy/enteropathy. 6. Large volume low-density abdominopelvic ascites. These results were called by telephone at the time of interpretation on 07/28/2023 at 10:28 pm to provider [facility] , who verbally acknowledged these results. Electronically Signed   By: Minerva Fester M.D.   On: 07/28/2023 22:32   DG Chest Portable 1 View Result Date: 07/28/2023 CLINICAL DATA:  History of diabetes and liver cancer, presenting with generalized weakness. EXAM: PORTABLE CHEST 1 VIEW COMPARISON:  June 11, 2023 FINDINGS: The right-sided venous Port-A-Cath seen on the prior study has been removed. The heart size and mediastinal contours are within normal limits. Low lung volumes are noted. A stable subcentimeter calcified lung nodule is seen within the periphery of the right lung base. There is no evidence of acute infiltrate, pleural effusion or pneumothorax. Numerous tiny radiopaque foreign bodies are again seen overlying the left upper quadrant. The visualized skeletal structures are unremarkable. IMPRESSION: Low lung volumes  without evidence of acute or active cardiopulmonary disease. Electronically Signed   By: Aram Candela M.D.   On: 07/28/2023 19:51        Scheduled Meds:  dicyclomine  10 mg Oral TID AC & HS   ferrous sulfate  325 mg Oral Q breakfast   insulin aspart  0-15 Units Subcutaneous TID PC,HS,0200   morphine  15 mg Oral Q12H   pantoprazole  40 mg Oral BID AC   polyethylene glycol  17 g Oral Daily   senna-docusate  1 tablet Oral BID   sodium chloride flush  3 mL Intravenous Q12H   sodium chloride flush  3 mL Intravenous Q12H   sucralfate  1 g Oral BID   Continuous Infusions:  albumin human 25 g (07/30/23 1158)   dextrose 5 % and 0.45 % NaCl with KCl 10 mEq/L 75 mL/hr at 07/30/23 0900     LOS: 2 days    Time spent: 45 minutes spent on chart review, discussion with nursing staff, consultants, updating family and interview/physical exam; more than 50% of that time was spent in counseling and/or coordination of care.    Joseph Art, DO [facility] Available via Epic secure chat 7am-7pm After these hours, please refer to coverage provider listed on [email or web address] 07/30/2023, 12:48 PM

## 2023-07-30 NOTE — Procedures (Signed)
 Ultrasound-guided diagnostic and therapeutic paracentesis performed yielding 2.4 liters of bloody fluid. No immediate complications. A portion of the fluid was sent to the lab for preordered studies. EBL none. BP remained soft during procedure. Dr. [name] notified.

## 2023-07-31 ENCOUNTER — Inpatient Hospital Stay (HOSPITAL_COMMUNITY)

## 2023-07-31 DIAGNOSIS — C220 Liver cell carcinoma: Secondary | ICD-10-CM | POA: Diagnosis not present

## 2023-07-31 DIAGNOSIS — R627 Adult failure to thrive: Secondary | ICD-10-CM | POA: Diagnosis not present

## 2023-07-31 DIAGNOSIS — R180 Malignant ascites: Secondary | ICD-10-CM | POA: Diagnosis not present

## 2023-07-31 DIAGNOSIS — Z515 Encounter for palliative care: Secondary | ICD-10-CM | POA: Diagnosis not present

## 2023-07-31 LAB — COMPREHENSIVE METABOLIC PANEL
ALT: 18 U/L (ref 0–44)
AST: 68 U/L — ABNORMAL HIGH (ref 15–41)
Albumin: 3.1 g/dL — ABNORMAL LOW (ref 3.5–5.0)
Alkaline Phosphatase: 131 U/L — ABNORMAL HIGH (ref 38–126)
Anion gap: 9 (ref 5–15)
BUN: 32 mg/dL — ABNORMAL HIGH (ref 8–23)
CO2: 21 mmol/L — ABNORMAL LOW (ref 22–32)
Calcium: 8.9 mg/dL (ref 8.9–10.3)
Chloride: 102 mmol/L (ref 98–111)
Creatinine, Ser: 1.76 mg/dL — ABNORMAL HIGH (ref 0.61–1.24)
GFR, Estimated: 41 mL/min — ABNORMAL LOW (ref >60)
Glucose, Bld: 162 mg/dL — ABNORMAL HIGH (ref 70–99)
Potassium: 4 mmol/L (ref 3.5–5.1)
Sodium: 132 mmol/L — ABNORMAL LOW (ref 135–145)
Total Bilirubin: 1.4 mg/dL — ABNORMAL HIGH (ref 0.0–1.2)
Total Protein: 7 g/dL (ref 6.5–8.1)

## 2023-07-31 LAB — CBC
HCT: 21.7 % — ABNORMAL LOW (ref 39.0–52.0)
Hemoglobin: 6.9 g/dL — CL (ref 13.0–17.0)
MCH: 30.9 pg (ref 26.0–34.0)
MCHC: 31.8 g/dL (ref 30.0–36.0)
MCV: 97.3 fL (ref 80.0–100.0)
Platelets: 114 10*3/uL — ABNORMAL LOW (ref 150–400)
RBC: 2.23 MIL/uL — ABNORMAL LOW (ref 4.22–5.81)
RDW: 16.3 % — ABNORMAL HIGH (ref 11.5–15.5)
WBC: 6 10*3/uL (ref 4.0–10.5)
nRBC: 0 % (ref 0.0–0.2)

## 2023-07-31 LAB — GLUCOSE, CAPILLARY
Glucose-Capillary: 181 mg/dL — ABNORMAL HIGH (ref 70–99)
Glucose-Capillary: 143 mg/dL — ABNORMAL HIGH (ref 70–99)
Glucose-Capillary: 164 mg/dL — ABNORMAL HIGH (ref 70–99)
Glucose-Capillary: 140 mg/dL — ABNORMAL HIGH (ref 70–99)

## 2023-07-31 LAB — HEMOGLOBIN AND HEMATOCRIT, BLOOD
HCT: 25.2 % — ABNORMAL LOW (ref 39.0–52.0)
Hemoglobin: 8.1 g/dL — ABNORMAL LOW (ref 13.0–17.0)

## 2023-07-31 LAB — CYTOLOGY - NON PAP

## 2023-07-31 LAB — PREPARE RBC (CROSSMATCH)

## 2023-07-31 MED ORDER — FAMOTIDINE 20 MG PO TABS
10.0000 mg | ORAL_TABLET | Freq: Every day | ORAL | Status: DC
  Administered 2023-07-31 – 2023-08-05 (×6): 10 mg via ORAL
  Filled 2023-07-31 (×6): qty 1

## 2023-07-31 MED ORDER — PANTOPRAZOLE SODIUM 40 MG IV SOLR
40.0000 mg | Freq: Two times a day (BID) | INTRAVENOUS | Status: DC
  Administered 2023-07-31 (×2): 40 mg via INTRAVENOUS
  Filled 2023-07-31 (×2): qty 10

## 2023-07-31 MED ORDER — SODIUM CHLORIDE 0.9% IV SOLUTION: Freq: Once | INTRAVENOUS | Status: AC

## 2023-07-31 NOTE — Progress Notes (Signed)
 Daily Progress Note   Patient Name: Troy [name]       Date: 07/31/2023 DOB: 1956/03/02  Age: 68 y.o. MRN#: [id number] Attending Physician: Joseph Art, DO Primary Care Physician: [name], MD Admit Date: 07/28/2023  Reason for Consultation/Follow-up: Establishing goals of care  Subjective: Awake alert, abdomen feels somewhat better, wife at bedside. Patient and wife want to re discuss DNR. See below.   Length of Stay: 3  Current Medications: Scheduled Meds:   famotidine  10 mg Oral Daily   ferrous sulfate  325 mg Oral Q breakfast   insulin aspart  0-5 Units Subcutaneous QHS   insulin aspart  0-6 Units Subcutaneous TID WC   morphine  15 mg Oral Q12H   pantoprazole (PROTONIX) IV  40 mg Intravenous Q12H   polyethylene glycol  17 g Oral Daily   senna-docusate  1 tablet Oral BID   sodium chloride flush  3 mL Intravenous Q12H   sodium chloride flush  3 mL Intravenous Q12H   sucralfate  1 g Oral BID   tamsulosin  0.4 mg Oral QPC supper    Continuous Infusions:   PRN Meds: acetaminophen **OR** acetaminophen, HYDROcodone-acetaminophen, HYDROmorphone (DILAUDID) injection, lip balm, ondansetron **OR** ondansetron (ZOFRAN) IV, sodium chloride flush  Physical Exam         Awake alert Has hiccups Appears with generalized weakness Trace edema Mild abdominal distension  Vital Signs: BP (!) 113/98   Pulse 93   Temp 98 F (36.7 C) (Oral)   Resp 16   Ht 5\' 10"  (1.778 m)   Wt 58.6 kg   SpO2 95%   BMI 18.54 kg/m  SpO2: SpO2: 95 % O2 Device: O2 Device: Room Air O2 Flow Rate:    Intake/output summary:  Intake/Output Summary (Last 24 hours) at 07/31/2023 1037 Last data filed at 07/31/2023 0843 Gross per 24 hour  Intake 1054.68 ml  Output 220 ml  Net 834.68 ml   LBM: Last  BM Date : 07/28/23 Baseline Weight: Weight: 58.6 kg Most recent weight: Weight: 58.6 kg       Palliative Assessment/Data:      Patient Active Problem List   Diagnosis Date Noted   Abdominal pain 07/28/2023   Failure to thrive in adult 07/28/2023   Chronic idiopathic thrombocytopenia (HCC) 07/28/2023   Chronic anemia 07/28/2023  Candidemia (HCC) 06/18/2023   Fever 06/17/2023   History of hepatocellular carcinoma 06/16/2023   Infection due to Stenotrophomonas maltophilia 06/16/2023   Bacteremia due to Klebsiella pneumoniae 06/15/2023   Port or reservoir infection 06/15/2023   SIRS (systemic inflammatory response syndrome) (HCC) 06/11/2023   Cancer related pain 06/11/2023   Hyponatremia 06/11/2023   Severe sepsis (HCC) 04/21/2023   Lt inguinal pain 03/12/2023   Thrombus 03/11/2023   Hepatic encephalopathy (HCC) 11/22/2022   Acute hepatic encephalopathy (HCC) 11/21/2022   Hematuria 10/17/2022   Protein-calorie malnutrition, severe 10/17/2022   Acute GI bleeding 10/16/2022   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   Melena 09/10/2022   ABLA (acute blood loss anemia) 09/10/2022   Dehydration 04/22/2022   Tachycardia 04/22/2022   Hypercalcemia 04/22/2022   Thrombocytopenia (HCC) 04/22/2022   Decreased range of motion of neck 04/02/2022   Neck pain 04/02/2022   Nausea & vomiting 12/18/2021   Cancer, hepatocellular (HCC) 11/23/2021   Portal hypertensive gastropathy (HCC)    Secondary esophageal varices without bleeding (HCC)    Esophageal candidiasis (HCC)    Malnutrition of moderate degree 11/01/2021   Liver mass    Gastro-esophageal reflux disease without esophagitis 10/31/2021   Insulin dependent type 2 diabetes mellitus (HCC) 10/31/2021   Portal vein thrombosis with liver mass concerning for malignancy  10/31/2021   Cocaine abuse, uncomplicated (HCC) 01/21/2021   Stage 3b chronic kidney disease (HCC) 11/06/2020   Chronic low back pain 03/06/2020    Hyperlipidemia 03/06/2020   Essential hypertension 03/06/2020   Acute on chronic blood loss anemia 05/13/2019   Hepatic 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  History of recurrent GI bleed due to hepatic gastropathy and chronic blood loss anemia.  Additionally, diabetes hypertension and deficiency anemia chronic constipation. Patient follows with Ms. Wynonia Sours, NP colleagues at [facility], his medical oncologist is Dr. Sherron Monday. Patient admitted to the hospital with abdominal pain, chronic pain syndrome cancer-related pain.  Patient continues to have ongoing decline in functional status essentially since the last 6 to 8 weeks.  Patient with failure to thrive, advanced stage IV hepatocellular carcinoma.  Patient with worsening kidney function as well.  Recent CT imaging is reviewed. Palliative consult for goals of care discussions has been requested.  Recommendations/Plan:  Reviewed DNR discussions again - fully explained full code versus DNR DNI. Code status to remain DNR DNI.  Home with hospice: wife remains resistant to hospice philosophy but understands importance and is willing to receive a call from hospice liaison today to see if she wants to take the patient home with hospice towards the end of this hospitalization. Will reach out to [facility] to help coordinate.  Offered Gabapentin as a palliative measure for management of intractable hiccups - explained rationale: it interrupts abnormal phrenic nerve signaling responsible for hiccups, dampens neuronal excitability and calms overactive nerve signals.  Patient/wife would like to hold off and not start Gabapentin.     Code Status:    Code Status Orders  (From admission, onward)           Start     Ordered   07/30/23 1607  Do not  attempt resuscitation (DNR) Pre-Arrest Interventions Desired  (Code Status)  Continuous       Question Answer Comment  If pulseless and not breathing No CPR or chest compressions.   In Pre-Arrest Conditions (Patient Has Pulse and Is Breathing) May intubate, use advanced airway interventions and cardioversion/ACLS medications if appropriate or indicated. May transfer to ICU.   Consent: Discussion documented in EHR or advanced directives reviewed      07/30/23 1606           Code Status History     Date Active Date Inactive Code Status Order ID Comments User Context   07/28/2023 2345 07/30/2023 1606 Full Code [id number]  Tereasa Coop, MD ED   06/11/2023 2145 06/20/2023 1709 Full Code [id number]  Charlsie Quest, MD ED   04/21/2023 1949 04/24/2023 1745 Full Code [id number]  Almon Hercules, MD ED   03/12/2023 0106 03/12/2023 1722 Full Code [id number]  Anselm Jungling, DO ED   11/21/2022 2243 11/25/2022 1938 Full Code [id number]  Gery Pray, MD ED   10/16/2022 1536 10/19/2022 1915 Full Code [id number]  Lorin Glass, MD Inpatient   04/22/2022 2153 04/25/2022 2000 Full Code [id number]  John Giovanni, MD ED   12/18/2021 2353 12/19/2021 1907 Full Code [id number]  Synetta Fail, MD ED   10/31/2021 1944 11/04/2021 2028 Full Code [id number]  Orland Mustard, MD ED       Prognosis:  < 6 months  Discharge Planning: Home with Hospice  Care plan was discussed with patient and wife.   Thank you for allowing the Palliative Medicine Team to assist in the care of this patient. High MDM     Greater than 50%  of this time was spent counseling and coordinating care related to the above assessment and plan.  Rosalin Hawking, MD  Please contact Palliative Medicine Team phone at [phone] for questions and concerns.

## 2023-07-31 NOTE — Plan of Care (Signed)
 Urology engaged for difficult foley with urinary retention. Pt's bladder scans have varied by several hundred mL, not correlated to UOP. He has large volume ascites- suspected to be malignant in the context of stg IV hepatocellular carcinoma. I believe the bladder scanner is picking these up and that the patient is not obstructed. Renal US has been ordered and primary team will contact us is there is evidence of urinary retention. Would be useful to have tech scan his bladder while they are there.

## 2023-07-31 NOTE — Progress Notes (Signed)
 PROGRESS NOTE    Troy [name]  ZOX:[id number] DOB: [birth year] DOA: 07/28/2023 PCP: [name], MD    Brief Narrative:  Troy [name] is a 68 y.o. male with medical history significant of stage IV hepatocellular carcinoma status post radiation and TREC 2023 currently on chemotherapy, hepatic cirrhosis with esophageal varices, chronic thrombocytopenia, chronic portal vein thrombosis, history of recurrent GI bleed due to hepatic gastropathy, chronic blood loss anemia, insulin-dependent DM type II, essential hypertension, iron deficiency anemia, chronic constipation and chronic hypokalemia presented emergency department complaining of abdominal pain.     Assessment and Plan: Suspected malignant ascites in advanced stage IV hepatocellular carcinoma Hepatic cirrhosis with esophageal varices Portal hypertensive gastropathy - Patient presenting with acute on chronic abdominal pain without any nausea and vomiting.  Denies any fever, chill constipation or diarrhea.  Patient has a stage IV liver cell carcinoma currently ongoing radiation therapy.  Last radiation treatment 07/22/2023.  Reported since the treatment patient has significantly worsening abdominal pain. -CT abdomen pelvis showing extensive metastatic lesion and stage IV hepatocellular carcinoma.  New thrombosis of the perigastric and perisplenic varices.  Chronic portal vein thrombosis. -s/p thoracentesis-- start IV albumin  - Continue scheduled MS Contin 50 mg twice daily and continue Norco as needed for breakthrough pain, PRN IV meds -oncology: Due to his rapid cancer progression, and significantly decreased performance status, and multiple cancer related symptoms, I again recommend hospice home care.  I discussed the logistics, benefit and limitation of hospice care.  -palliative care following     Chronic portal vein thrombosis New perigastric and and perisplenic varices -CT abdomen pelvis showing chronic portal vein thrombosis.  New  perigastric and perisplenic varices thrombosis as well. -Given patient has history of chronic blood loss anemia in the context of protal hypertensive gastropathy and and esophageal varices which is limiting the use of anticoagulation in the past.  any IV anticoagulation as of now as high risk of bleeding/has anemia -unable to tolerate the  low-dose propranolol 20 mg twice daily due to hypotension   Anemia of chronic disease - Hemoglobin is around 10.8 to 8 weeks ago.  Baseline hemoglobin between 8-10.  Continue oral iron supplements. -transfuse as hgb < 7-- recheck after  AKI -IVF -bladder scan to r/o obstruction -renal U/S  Decompensated hepatic cirrhosis Chronic thrombocytopenia -Same showing chronically elevated AST/ALT.  -trend   Insulin-dependent DM type II -Patient has poor oral intake of food due to abdominal pain.  Holding metformin and Jardiance to prevent hypoglycemia.' -IVF   GERD - Continue Protonix and sucralfate.   Chronic constipation -Continue MiraLAX and Senokot.  ?acute urinary retention vs ascites -renal US pending -may need urology to place foley    DVT prophylaxis: SCDs Start: 07/28/23 2347 Place TED hose Start: 07/28/23 2347    Code Status: Do not attempt resuscitation (DNR) PRE-ARREST INTERVENTIONS DESIRED Family Communication: at bedside  Disposition Plan:  Level of care: Progressive Status is: Inpatient     Consultants:  Oncology IR- paracentesis Palliative care  Subjective: Having some GERD like symptoms  Objective: Vitals:   07/31/23 0819 07/31/23 1011 07/31/23 1034 07/31/23 1158  BP: 99/62 115/61 (!) 113/98 97/65  Pulse: 94 96 93 84  Resp: 18 15 16 18   Temp: 98.5 F (36.9 C) 98.4 F (36.9 C) 98 F (36.7 C) 98.3 F (36.8 C)  TempSrc: Oral Oral Oral Oral  SpO2: 100% 95% 95% 97%  Weight:      Height:        Intake/Output Summary (Last  24 hours) at 07/31/2023 1223 Last data filed at 07/31/2023 0843 Gross per 24 hour   Intake 1054.68 ml  Output 220 ml  Net 834.68 ml   Filed Weights   07/28/23 1802 07/29/23 0041  Weight: 58.6 kg 58.6 kg    Examination:   General: Appearance:    Thin male who appears uncomfortable     Lungs:     respirations unlabored  Heart:    Normal heart rate.    MS:   All extremities are intact.    Neurologic:   Awake, alert       Data Reviewed: I have personally reviewed following labs and imaging studies  CBC: Recent Labs  Lab 07/28/23 1925 07/29/23 0626 07/30/23 0607 07/31/23 0332  WBC 5.7 6.9 7.6 6.0  NEUTROABS 4.6  --   --   --   HGB 8.8* 7.6* 8.3* 6.9*  HCT 27.5* 24.2* 26.4* 21.7*  MCV 96.2 96.4 96.7 97.3  PLT 172 149* 160 114*   Basic Metabolic Panel: Recent Labs  Lab 07/28/23 2015 07/29/23 0626 07/30/23 0607 07/31/23 0332  NA 131* 134* 132* 132*  K 4.3 3.8 4.2 4.0  CL 101 103 102 102  CO2 20* 22 23 21*  GLUCOSE 319* 162* 182* 162*  BUN 22 20 26* 32*  CREATININE 0.96 0.95 1.41* 1.76*  CALCIUM 8.8* 8.7* 9.0 8.9   GFR: Estimated Creatinine Clearance: 32.8 mL/min (A) (by C-G formula based on SCr of 1.76 mg/dL (H)). Liver Function Tests: Recent Labs  Lab 07/28/23 2015 07/29/23 0626 07/31/23 0332  AST 53* 50* 68*  ALT 18 17 18   ALKPHOS 193* 170* 131*  BILITOT 1.1 0.8 1.4*  PROT 7.5 6.7 7.0  ALBUMIN 2.1* 2.0* 3.1*   No results for input(s): "LIPASE", "AMYLASE" in the last 168 hours. No results for input(s): "AMMONIA" in the last 168 hours. Coagulation Profile: No results for input(s): "INR", "PROTIME" in the last 168 hours. Cardiac Enzymes: No results for input(s): "CKTOTAL", "CKMB", "CKMBINDEX", "TROPONINI" in the last 168 hours. BNP (last 3 results) No results for input(s): "PROBNP" in the last 8760 hours. HbA1C: No results for input(s): "HGBA1C" in the last 72 hours. CBG: Recent Labs  Lab 07/30/23 1103 07/30/23 1642 07/30/23 2206 07/31/23 0744 07/31/23 1155  GLUCAP 146* 149* 155* 181* 143*   Lipid Profile: No  results for input(s): "CHOL", "HDL", "LDLCALC", "TRIG", "CHOLHDL", "LDLDIRECT" in the last 72 hours. Thyroid Function Tests: No results for input(s): "TSH", "T4TOTAL", "FREET4", "T3FREE", "THYROIDAB" in the last 72 hours. Anemia Panel: No results for input(s): "VITAMINB12", "FOLATE", "FERRITIN", "TIBC", "IRON", "RETICCTPCT" in the last 72 hours. Sepsis Labs: No results for input(s): "PROCALCITON", "LATICACIDVEN" in the last 168 hours.  Recent Results (from the past 240 hours)  Resp panel by RT-PCR (RSV, Flu A&B, Covid) Anterior Nasal Swab     Status: None   Collection Time: 07/28/23  6:41 PM   Specimen: Anterior Nasal Swab  Result Value Ref Range Status   SARS Coronavirus 2 by RT PCR NEGATIVE NEGATIVE Final    Comment: (NOTE) SARS-CoV-2 target nucleic acids are NOT DETECTED.  The SARS-CoV-2 RNA is generally detectable in upper respiratory specimens during the acute phase of infection. The lowest concentration of SARS-CoV-2 viral copies this assay can detect is 138 copies/mL. A negative result does not preclude SARS-Cov-2 infection and should not be used as the sole basis for treatment or other patient management decisions. A negative result may occur with  improper specimen collection/handling, submission of specimen other  than nasopharyngeal swab, presence of viral mutation(s) within the areas targeted by this assay, and inadequate number of viral copies(<138 copies/mL). A negative result must be combined with clinical observations, patient history, and epidemiological information. The expected result is Negative.  Fact Sheet for Patients:  BloggerCourse.com  Fact Sheet for Healthcare Providers:  SeriousBroker.it  This test is no t yet approved or cleared by the [place] FDA and  has been authorized for detection and/or diagnosis of SARS-CoV-2 by FDA under an Emergency Use Authorization (EUA). This EUA will remain  in  effect (meaning this test can be used) for the duration of the COVID-19 declaration under Section 564(b)(1) of the Act, 21 U.S.C.section 360bbb-3(b)(1), unless the authorization is terminated  or revoked sooner.       Influenza A by PCR NEGATIVE NEGATIVE Final   Influenza B by PCR NEGATIVE NEGATIVE Final    Comment: (NOTE) The Xpert Xpress SARS-CoV-2/FLU/RSV plus assay is intended as an aid in the diagnosis of influenza from Nasopharyngeal swab specimens and should not be used as a sole basis for treatment. Nasal washings and aspirates are unacceptable for Xpert Xpress SARS-CoV-2/FLU/RSV testing.  Fact Sheet for Patients: BloggerCourse.com  Fact Sheet for Healthcare Providers: SeriousBroker.it  This test is not yet approved or cleared by the [place] FDA and has been authorized for detection and/or diagnosis of SARS-CoV-2 by FDA under an Emergency Use Authorization (EUA). This EUA will remain in effect (meaning this test can be used) for the duration of the COVID-19 declaration under Section 564(b)(1) of the Act, 21 U.S.C. section 360bbb-3(b)(1), unless the authorization is terminated or revoked.     Resp Syncytial Virus by PCR NEGATIVE NEGATIVE Final    Comment: (NOTE) Fact Sheet for Patients: BloggerCourse.com  Fact Sheet for Healthcare Providers: SeriousBroker.it  This test is not yet approved or cleared by the [place] FDA and has been authorized for detection and/or diagnosis of SARS-CoV-2 by FDA under an Emergency Use Authorization (EUA). This EUA will remain in effect (meaning this test can be used) for the duration of the COVID-19 declaration under Section 564(b)(1) of the Act, 21 U.S.C. section 360bbb-3(b)(1), unless the authorization is terminated or revoked.  Performed at [facility], [address], [place], [place] [address]    Body fluid culture w Gram Stain     Status: None (Preliminary result)   Collection Time: 07/30/23 12:24 PM   Specimen: PATH Cytology Peritoneal fluid  Result Value Ref Range Status   Specimen Description   Final    PERITONEAL Performed at [facility], [address], [place], [place] [address]    Special Requests   Final    NONE Performed at [facility], [address] [place], [place] [address]    Gram Stain   Final    FEW WBC PRESENT, PREDOMINANTLY PMN NO ORGANISMS SEEN    Culture   Final    NO GROWTH < 24 HOURS Performed at [facility], [address] [place], [place] [address]    Report Status PENDING  Incomplete         Radiology Studies: US Paracentesis Result Date: 07/30/2023 INDICATION: Patient with history of stage IV hepatocellular carcinoma, cirrhosis, chronic portal vein thrombosis, anemia, acute kidney injury, ascites. Request received for diagnostic and therapeutic paracentesis. EXAM: ULTRASOUND GUIDED DIAGNOSTIC AND THERAPEUTIC PARACENTESIS MEDICATIONS: 6 mL 1% lidocaine COMPLICATIONS: None immediate. PROCEDURE: Informed written consent was obtained from the patient after a discussion of the  risks, benefits and alternatives to treatment. A timeout was performed prior to the initiation of the procedure. Initial ultrasound scanning demonstrates a moderate amount of ascites within the right mid to lower abdominal quadrant. The right mid to lower abdomen was prepped and draped in the usual sterile fashion. 1% lidocaine was used for local anesthesia. Following this, a 19 gauge, 7-cm, Yueh catheter was introduced. An ultrasound image was saved for documentation purposes. The paracentesis was performed. The catheter was removed and a dressing was applied. The patient tolerated the procedure well without immediate post procedural complication. FINDINGS: A total of approximately 2.4 liters of bloody fluid was removed. Samples were  sent to the laboratory as requested by the clinical team. IMPRESSION: Successful ultrasound-guided diagnostic and therapeutic paracentesis yielding 2.4 liters of peritoneal fluid. Performed by: Jeananne Rama, PA-C Electronically Signed   By: Judie Petit.  Shick M.D.   On: 07/30/2023 14:28        Scheduled Meds:  famotidine  10 mg Oral Daily   ferrous sulfate  325 mg Oral Q breakfast   insulin aspart  0-5 Units Subcutaneous QHS   insulin aspart  0-6 Units Subcutaneous TID WC   morphine  15 mg Oral Q12H   pantoprazole (PROTONIX) IV  40 mg Intravenous Q12H   polyethylene glycol  17 g Oral Daily   senna-docusate  1 tablet Oral BID   sodium chloride flush  3 mL Intravenous Q12H   sodium chloride flush  3 mL Intravenous Q12H   sucralfate  1 g Oral BID   tamsulosin  0.4 mg Oral QPC supper   Continuous Infusions:     LOS: 3 days    Time spent: 45 minutes spent on chart review, discussion with nursing staff, consultants, updating family and interview/physical exam; more than 50% of that time was spent in counseling and/or coordination of care.    Joseph Art, DO [facility] Available via Epic secure chat 7am-7pm After these hours, please refer to coverage provider listed on [email or web address] 07/31/2023, 12:23 PM

## 2023-07-31 NOTE — Progress Notes (Signed)
    Patient Name: Welborn Keena           DOB: 1955-10-21  MRN: [id number]      Admission Date: 07/28/2023  Attending Provider: Joseph Art, DO  Primary Diagnosis: Abdominal pain   Level of care: Progressive    CROSS COVER NOTE   Date of Service   07/31/2023   Aadil Sur, 68 y.o. male, was admitted on 07/28/2023 for Abdominal pain.    HPI/Events of Note   Anemia of chronic disease Hemoglobin 8.3 -->  6.9.  Baseline hemoglobin ~ 8-10. Continue oral iron supplements. No acute changes reported.  Hemodynamically stable. No melena, hematochezia, or other bleeding reported tonight.  Ultrasound-guided diagnostic and therapeutic paracentesis performed yielding 2.4 liters of bloody fluid. No other blood loss during procedure.    Interventions/ Plan   Blood transfusion, 1 unit PRBC Recheck H&H after transfusion is completed, transfuse if HGB <7        Anthoney Harada, DNP, [employer] [facility] [place]

## 2023-07-31 NOTE — Progress Notes (Signed)
 Patient urinated of clear amber urine. Post void bladder scan is 561. Liana Crocker NP was informed and ordered to notify the on call urology to help insert the catheter since day shift already encountered difficulty. Dr. Cardell Peach was notified and after reviewing the chart of the patient, Dr. Cardell Peach thinks that maybe the bladder scan might not be accurate because of ascites plus patient was able to urinate. Advised to monitor the patient and inform urology again if no urine output.

## 2023-07-31 NOTE — TOC Initial Note (Addendum)
 Transition of Care [facility]) - Initial/Assessment Note   Patient Details  Name: Troy [name] MRN: [id number] Date of Birth: June 22, 1955  [facility] CM/SW Contact:    Ewing Schlein, LCSW Phone Number: 07/31/2023, 9:41 AM  Clinical Narrative: Patient is from home with wife. Palliative consulted. TOC following for discharge needs.  Addendum: TOC consulted for home hospice referral. CSW spoke with wife, Jacinto Keil, regarding referral. Wife chose [facility] and requested to speak with someone through hospice before officially agreeing to home hospice services. CSW made referral to Cheri with [facility].  Expected Discharge Plan:  (TBD) Barriers to Discharge: Continued Medical Work up  Expected Discharge Plan and Services In-house Referral: Clinical Social Work Living arrangements for the past 2 months: Single Family Home  Prior Living Arrangements/Services Living arrangements for the past 2 months: Single Family Home Lives with:: Spouse Patient language and need for interpreter reviewed:: Yes Do you feel safe going back to the place where you live?: Yes      Need for Family Participation in Patient Care: No (Comment) Care giver support system in place?: Yes (comment) Criminal Activity/Legal Involvement Pertinent to Current Situation/Hospitalization: No - Comment as needed  Activities of Daily Living ADL Screening (condition at time of admission) Independently performs ADLs?: Yes (appropriate for developmental age) Is the patient deaf or have difficulty hearing?: No Does the patient have difficulty seeing, even when wearing glasses/contacts?: No Does the patient have difficulty concentrating, remembering, or making decisions?: No  Emotional Assessment Orientation: : Oriented to Self, Oriented to Place, Oriented to  Time, Oriented to Situation Alcohol / Substance Use: Not Applicable Psych Involvement: No (comment)  Admission diagnosis:   Dehydration [E86.0] Hepatocellular carcinoma (HCC) [C22.0] Failure to thrive in adult [R62.7] Abdominal pain [R10.9] Patient Active Problem List   Diagnosis Date Noted   Abdominal pain 07/28/2023   Failure to thrive in adult 07/28/2023   Chronic idiopathic thrombocytopenia (HCC) 07/28/2023   Chronic anemia 07/28/2023   Candidemia (HCC) 06/18/2023   Fever 06/17/2023   History of hepatocellular carcinoma 06/16/2023   Infection due to Stenotrophomonas maltophilia 06/16/2023   Bacteremia due to Klebsiella pneumoniae 06/15/2023   Port or reservoir infection 06/15/2023   SIRS (systemic inflammatory response syndrome) (HCC) 06/11/2023   Cancer related pain 06/11/2023   Hyponatremia 06/11/2023   Severe sepsis (HCC) 04/21/2023   Lt inguinal pain 03/12/2023   Thrombus 03/11/2023   Hepatic encephalopathy (HCC) 11/22/2022   Acute hepatic encephalopathy (HCC) 11/21/2022   Hematuria 10/17/2022   Protein-calorie malnutrition, severe 10/17/2022   Acute GI bleeding 10/16/2022   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   Melena 09/10/2022   ABLA (acute blood loss anemia) 09/10/2022   Dehydration 04/22/2022   Tachycardia 04/22/2022   Hypercalcemia 04/22/2022   Thrombocytopenia (HCC) 04/22/2022   Decreased range of motion of neck 04/02/2022   Neck pain 04/02/2022   Nausea & vomiting 12/18/2021   Cancer, hepatocellular (HCC) 11/23/2021   Portal hypertensive gastropathy (HCC)    Secondary esophageal varices without bleeding (HCC)    Esophageal candidiasis (HCC)    Malnutrition of moderate degree 11/01/2021   Liver mass    Gastro-esophageal reflux disease without esophagitis 10/31/2021   Insulin dependent type 2 diabetes mellitus (HCC) 10/31/2021   Portal vein thrombosis with liver mass concerning for malignancy  10/31/2021   Cocaine abuse, uncomplicated (HCC) 01/21/2021   Stage 3b chronic kidney disease (HCC) 11/06/2020   Chronic low back pain 03/06/2020   Hyperlipidemia  03/06/2020   Essential hypertension 03/06/2020   Acute on chronic blood loss anemia 05/13/2019   Hepatic cirrhosis due to chronic hepatitis C infection (HCC) 05/12/2008   PCP:  [name], MD Pharmacy:   [facility] - [place], [place] - [address] [place] [address] Phone: [phone] Fax: [phone]  [facility] [address] [place] [address] Phone: [phone] Fax: [phone]  Social Drivers of Health (SDOH) Social History: SDOH Screenings   Food Insecurity: No Food Insecurity (07/29/2023)  Housing: Low Risk  (07/29/2023)  Transportation Needs: No Transportation Needs (07/29/2023)  Utilities: Not At Risk (07/29/2023)  Social Connections: Socially Integrated (07/29/2023)  Tobacco Use: Medium Risk (07/29/2023)   SDOH Interventions:    Readmission Risk Interventions    07/31/2023    9:36 AM  Readmission Risk Prevention Plan  Transportation Screening Complete  Medication Review (RN Care Manager) Complete  HRI or Home Care Consult Complete  SW Recovery Care/Counseling Consult Complete  Palliative Care Screening Complete  Skilled Nursing Facility Not Applicable

## 2023-08-01 DIAGNOSIS — C220 Liver cell carcinoma: Secondary | ICD-10-CM | POA: Diagnosis not present

## 2023-08-01 DIAGNOSIS — E860 Dehydration: Secondary | ICD-10-CM | POA: Diagnosis not present

## 2023-08-01 DIAGNOSIS — R627 Adult failure to thrive: Secondary | ICD-10-CM | POA: Diagnosis not present

## 2023-08-01 DIAGNOSIS — Z515 Encounter for palliative care: Secondary | ICD-10-CM | POA: Diagnosis not present

## 2023-08-01 LAB — GLUCOSE, CAPILLARY
Glucose-Capillary: 154 mg/dL — ABNORMAL HIGH (ref 70–99)
Glucose-Capillary: 119 mg/dL — ABNORMAL HIGH (ref 70–99)
Glucose-Capillary: 138 mg/dL — ABNORMAL HIGH (ref 70–99)
Glucose-Capillary: 158 mg/dL — ABNORMAL HIGH (ref 70–99)

## 2023-08-01 LAB — BASIC METABOLIC PANEL
Anion gap: 11 (ref 5–15)
BUN: 44 mg/dL — ABNORMAL HIGH (ref 8–23)
CO2: 19 mmol/L — ABNORMAL LOW (ref 22–32)
Calcium: 8.8 mg/dL — ABNORMAL LOW (ref 8.9–10.3)
Chloride: 104 mmol/L (ref 98–111)
Creatinine, Ser: 2.1 mg/dL — ABNORMAL HIGH (ref 0.61–1.24)
GFR, Estimated: 33 mL/min — ABNORMAL LOW (ref >60)
Glucose, Bld: 162 mg/dL — ABNORMAL HIGH (ref 70–99)
Potassium: 4.1 mmol/L (ref 3.5–5.1)
Sodium: 134 mmol/L — ABNORMAL LOW (ref 135–145)

## 2023-08-01 LAB — TYPE AND SCREEN
ABO/RH(D): A POS
Antibody Screen: NEGATIVE
Unit division: 0

## 2023-08-01 LAB — CBC
HCT: 23.5 % — ABNORMAL LOW (ref 39.0–52.0)
Hemoglobin: 7.4 g/dL — ABNORMAL LOW (ref 13.0–17.0)
MCH: 30.2 pg (ref 26.0–34.0)
MCHC: 31.5 g/dL (ref 30.0–36.0)
MCV: 95.9 fL (ref 80.0–100.0)
Platelets: 104 10*3/uL — ABNORMAL LOW (ref 150–400)
RBC: 2.45 MIL/uL — ABNORMAL LOW (ref 4.22–5.81)
RDW: 16.4 % — ABNORMAL HIGH (ref 11.5–15.5)
WBC: 5.6 10*3/uL (ref 4.0–10.5)
nRBC: 0 % (ref 0.0–0.2)

## 2023-08-01 LAB — BPAM RBC
Blood Product Expiration Date: 202504192359
ISSUE DATE / TIME: 202503211010
Unit Type and Rh: 6200

## 2023-08-01 MED ORDER — SODIUM CHLORIDE 0.9 % IV SOLN: INTRAVENOUS | Status: AC

## 2023-08-01 MED ORDER — ORAL CARE MOUTH RINSE: 15.0000 mL | OROMUCOSAL | Status: DC | PRN

## 2023-08-01 MED ORDER — PANTOPRAZOLE SODIUM 40 MG PO TBEC
40.0000 mg | DELAYED_RELEASE_TABLET | Freq: Two times a day (BID) | ORAL | Status: DC
  Administered 2023-08-01 – 2023-08-05 (×9): 40 mg via ORAL
  Filled 2023-08-01 (×9): qty 1

## 2023-08-01 MED ORDER — OXYCODONE HCL ER 15 MG PO T12A
15.0000 mg | EXTENDED_RELEASE_TABLET | Freq: Two times a day (BID) | ORAL | Status: DC
  Administered 2023-08-01 – 2023-08-05 (×8): 15 mg via ORAL
  Filled 2023-08-01 (×8): qty 1

## 2023-08-01 NOTE — Progress Notes (Signed)
 PROGRESS NOTE    Troy [name]  OZH:[id number] DOB: 26-May-1955 DOA: 07/28/2023 PCP: [name], MD    Brief Narrative:  Troy [name] is a 68 y.o. male with medical history significant of stage IV hepatocellular carcinoma status post radiation and TREC 2023 currently on chemotherapy, hepatic cirrhosis with esophageal varices, chronic thrombocytopenia, chronic portal vein thrombosis, history of recurrent GI bleed due to hepatic gastropathy, chronic blood loss anemia, insulin-dependent DM type II, essential hypertension, iron deficiency anemia, chronic constipation and chronic hypokalemia presented emergency department complaining of abdominal pain.     Assessment and Plan: Suspected malignant ascites in advanced stage IV hepatocellular carcinoma Hepatic cirrhosis with esophageal varices Portal hypertensive gastropathy - Patient presenting with acute on chronic abdominal pain without any nausea and vomiting.  Denies any fever, chill constipation or diarrhea.  Patient has a stage IV liver cell carcinoma currently ongoing radiation therapy.  Last radiation treatment 07/22/2023.  Reported since the treatment patient has significantly worsening abdominal pain. -CT abdomen pelvis showing extensive metastatic lesion and stage IV hepatocellular carcinoma.  New thrombosis of the perigastric and perisplenic varices.  Chronic portal vein thrombosis. -s/p thoracentesis-- start IV albumin  - Continue scheduled MS Contin 50 mg twice daily and continue Norco as needed for breakthrough pain, PRN IV meds -oncology: Due to his rapid cancer progression, and significantly decreased performance status, and multiple cancer related symptoms, I again recommend hospice home care.  I discussed the logistics, benefit and limitation of hospice care.  -palliative care following  -family waiting to speak with [facility]   Chronic portal vein thrombosis New perigastric and and perisplenic varices -CT abdomen pelvis  showing chronic portal vein thrombosis.  New perigastric and perisplenic varices thrombosis as well. -Given patient has history of chronic blood loss anemia in the context of protal hypertensive gastropathy and and esophageal varices which is limiting the use of anticoagulation in the past.  any IV anticoagulation as of now as high risk of bleeding/has anemia -unable to tolerate the  low-dose propranolol 20 mg twice daily due to hypotension   Anemia of chronic disease - Hemoglobin is around 10.8 to 8 weeks ago.  Baseline hemoglobin between 8-10.  Continue oral iron supplements. transfused PRBC and monitor  AKI-- >?hepatorenal syndrome -IVF -renal U/S did not show hydronephrosis Wife says patient is urinating more  Decompensated hepatic cirrhosis Chronic thrombocytopenia -Same showing chronically elevated AST/ALT.  -trend   Insulin-dependent DM type II -Patient has poor oral intake of food due to abdominal pain.  Holding metformin and Jardiance to prevent hypoglycemia.' -IVF   GERD - Continue Protonix and sucralfate.   Chronic constipation -Continue MiraLAX and Senokot.  ?acute urinary retention vs ascites -appears to be urinating on own    DVT prophylaxis: SCDs Start: 07/28/23 2347 Place TED hose Start: 07/28/23 2347    Code Status: Do not attempt resuscitation (DNR) PRE-ARREST INTERVENTIONS DESIRED Family Communication: at bedside  Disposition Plan:  Level of care: Progressive Status is: Inpatient     Consultants:  Oncology IR- paracentesis Palliative care  Subjective: Anniversary was yesterday  Objective: Vitals:   07/31/23 1348 07/31/23 2022 08/01/23 0502 08/01/23 0900  BP: 127/72 101/62 120/71 126/68  Pulse: 90 87 95 91  Resp: 14 18 18    Temp: 97.8 F (36.6 C) 98.5 F (36.9 C) 97.8 F (36.6 C)   TempSrc: Oral Oral Oral   SpO2: 97% 96% 97% 96%  Weight:      Height:        Intake/Output Summary (  Last 24 hours) at 08/01/2023 1458 Last data filed  at 08/01/2023 1117 Gross per 24 hour  Intake 53 ml  Output 455 ml  Net -402 ml   Filed Weights   07/28/23 1802 07/29/23 0041  Weight: 58.6 kg 58.6 kg    Examination:   General: Appearance:    Thin male who appears uncomfortable     Lungs:     respirations unlabored  Heart:    Normal heart rate.    MS:   All extremities are intact.    Neurologic:   Awake, alert       Data Reviewed: I have personally reviewed following labs and imaging studies  CBC: Recent Labs  Lab 07/28/23 1925 07/29/23 0626 07/30/23 0607 07/31/23 0332 07/31/23 1544 08/01/23 0413  WBC 5.7 6.9 7.6 6.0  --  5.6  NEUTROABS 4.6  --   --   --   --   --   HGB 8.8* 7.6* 8.3* 6.9* 8.1* 7.4*  HCT 27.5* 24.2* 26.4* 21.7* 25.2* 23.5*  MCV 96.2 96.4 96.7 97.3  --  95.9  PLT 172 149* 160 114*  --  104*   Basic Metabolic Panel: Recent Labs  Lab 07/28/23 2015 07/29/23 0626 07/30/23 0607 07/31/23 0332 08/01/23 0413  NA 131* 134* 132* 132* 134*  K 4.3 3.8 4.2 4.0 4.1  CL 101 103 102 102 104  CO2 20* 22 23 21* 19*  GLUCOSE 319* 162* 182* 162* 162*  BUN 22 20 26* 32* 44*  CREATININE 0.96 0.95 1.41* 1.76* 2.10*  CALCIUM 8.8* 8.7* 9.0 8.9 8.8*   GFR: Estimated Creatinine Clearance: 27.5 mL/min (A) (by C-G formula based on SCr of 2.1 mg/dL (H)). Liver Function Tests: Recent Labs  Lab 07/28/23 2015 07/29/23 0626 07/31/23 0332  AST 53* 50* 68*  ALT 18 17 18   ALKPHOS 193* 170* 131*  BILITOT 1.1 0.8 1.4*  PROT 7.5 6.7 7.0  ALBUMIN 2.1* 2.0* 3.1*   No results for input(s): "LIPASE", "AMYLASE" in the last 168 hours. No results for input(s): "AMMONIA" in the last 168 hours. Coagulation Profile: No results for input(s): "INR", "PROTIME" in the last 168 hours. Cardiac Enzymes: No results for input(s): "CKTOTAL", "CKMB", "CKMBINDEX", "TROPONINI" in the last 168 hours. BNP (last 3 results) No results for input(s): "PROBNP" in the last 8760 hours. HbA1C: No results for input(s): "HGBA1C" in the last  72 hours. CBG: Recent Labs  Lab 07/31/23 1155 07/31/23 1654 07/31/23 2101 08/01/23 0751 08/01/23 1209  GLUCAP 143* 164* 140* 154* 119*   Lipid Profile: No results for input(s): "CHOL", "HDL", "LDLCALC", "TRIG", "CHOLHDL", "LDLDIRECT" in the last 72 hours. Thyroid Function Tests: No results for input(s): "TSH", "T4TOTAL", "FREET4", "T3FREE", "THYROIDAB" in the last 72 hours. Anemia Panel: No results for input(s): "VITAMINB12", "FOLATE", "FERRITIN", "TIBC", "IRON", "RETICCTPCT" in the last 72 hours. Sepsis Labs: No results for input(s): "PROCALCITON", "LATICACIDVEN" in the last 168 hours.  Recent Results (from the past 240 hours)  Resp panel by RT-PCR (RSV, Flu A&B, Covid) Anterior Nasal Swab     Status: None   Collection Time: 07/28/23  6:41 PM   Specimen: Anterior Nasal Swab  Result Value Ref Range Status   SARS Coronavirus 2 by RT PCR NEGATIVE NEGATIVE Final    Comment: (NOTE) SARS-CoV-2 target nucleic acids are NOT DETECTED.  The SARS-CoV-2 RNA is generally detectable in upper respiratory specimens during the acute phase of infection. The lowest concentration of SARS-CoV-2 viral copies this assay can detect is 138 copies/mL. A  negative result does not preclude SARS-Cov-2 infection and should not be used as the sole basis for treatment or other patient management decisions. A negative result may occur with  improper specimen collection/handling, submission of specimen other than nasopharyngeal swab, presence of viral mutation(s) within the areas targeted by this assay, and inadequate number of viral copies(<138 copies/mL). A negative result must be combined with clinical observations, patient history, and epidemiological information. The expected result is Negative.  Fact Sheet for Patients:  BloggerCourse.com  Fact Sheet for Healthcare Providers:  SeriousBroker.it  This test is no t yet approved or cleared by the  [place] FDA and  has been authorized for detection and/or diagnosis of SARS-CoV-2 by FDA under an Emergency Use Authorization (EUA). This EUA will remain  in effect (meaning this test can be used) for the duration of the COVID-19 declaration under Section 564(b)(1) of the Act, 21 U.S.C.section 360bbb-3(b)(1), unless the authorization is terminated  or revoked sooner.       Influenza A by PCR NEGATIVE NEGATIVE Final   Influenza B by PCR NEGATIVE NEGATIVE Final    Comment: (NOTE) The Xpert Xpress SARS-CoV-2/FLU/RSV plus assay is intended as an aid in the diagnosis of influenza from Nasopharyngeal swab specimens and should not be used as a sole basis for treatment. Nasal washings and aspirates are unacceptable for Xpert Xpress SARS-CoV-2/FLU/RSV testing.  Fact Sheet for Patients: BloggerCourse.com  Fact Sheet for Healthcare Providers: SeriousBroker.it  This test is not yet approved or cleared by the [place] FDA and has been authorized for detection and/or diagnosis of SARS-CoV-2 by FDA under an Emergency Use Authorization (EUA). This EUA will remain in effect (meaning this test can be used) for the duration of the COVID-19 declaration under Section 564(b)(1) of the Act, 21 U.S.C. section 360bbb-3(b)(1), unless the authorization is terminated or revoked.     Resp Syncytial Virus by PCR NEGATIVE NEGATIVE Final    Comment: (NOTE) Fact Sheet for Patients: BloggerCourse.com  Fact Sheet for Healthcare Providers: SeriousBroker.it  This test is not yet approved or cleared by the [place] FDA and has been authorized for detection and/or diagnosis of SARS-CoV-2 by FDA under an Emergency Use Authorization (EUA). This EUA will remain in effect (meaning this test can be used) for the duration of the COVID-19 declaration under Section 564(b)(1) of the Act, 21 U.S.C. section  360bbb-3(b)(1), unless the authorization is terminated or revoked.  Performed at [facility], [address], [place], [place] [address]   Body fluid culture w Gram Stain     Status: None (Preliminary result)   Collection Time: 07/30/23 12:24 PM   Specimen: PATH Cytology Peritoneal fluid  Result Value Ref Range Status   Specimen Description   Final    PERITONEAL Performed at [facility], [address] [place], [place] [address]    Special Requests   Final    NONE Performed at [facility], [address], [place], [place] [address]    Gram Stain   Final    FEW WBC PRESENT, PREDOMINANTLY PMN NO ORGANISMS SEEN    Culture   Final    NO GROWTH 2 DAYS Performed at [facility], [address] [place], [place] [address]    Report Status PENDING  Incomplete         Radiology Studies: US RENAL Result Date: 07/31/2023 CLINICAL DATA:  Acute kidney injury EXAM: RENAL / URINARY TRACT ULTRASOUND COMPLETE COMPARISON:  CT 07/28/2023 FINDINGS: Right  Kidney: Renal measurements: 10.9 x 4.6 x 4.7 cm = volume: 124.7 mL. Cortex appears echogenic. No mass or hydronephrosis. Left Kidney: Renal measurements: 12.1 x 5.7 x 5.8 cm = volume: 208.9 mL. Echogenicity within normal limits. No mass or hydronephrosis visualized. Bladder: Appears normal for degree of bladder distention. Other: Liver cirrhosis. At least moderate ascites in the right upper quadrant IMPRESSION: 1. Negative for hydronephrosis. Echogenic right renal cortex as can be seen with medical renal disease. 2. Liver cirrhosis with at least moderate ascites in the right upper quadrant. Electronically Signed   By: Jasmine Pang M.D.   On: 07/31/2023 19:06        Scheduled Meds:  famotidine  10 mg Oral Daily   ferrous sulfate  325 mg Oral Q breakfast   insulin aspart  0-5 Units Subcutaneous QHS   insulin aspart  0-6 Units Subcutaneous TID WC   oxyCODONE  15 mg Oral Q12H    pantoprazole  40 mg Oral BID   polyethylene glycol  17 g Oral Daily   senna-docusate  1 tablet Oral BID   sodium chloride flush  3 mL Intravenous Q12H   sodium chloride flush  3 mL Intravenous Q12H   sucralfate  1 g Oral BID   tamsulosin  0.4 mg Oral QPC supper   Continuous Infusions:  sodium chloride 75 mL/hr at 08/01/23 0820      LOS: 4 days    Time spent: 45 minutes spent on chart review, discussion with nursing staff, consultants, updating family and interview/physical exam; more than 50% of that time was spent in counseling and/or coordination of care.    Joseph Art, DO [facility] Available via Epic secure chat 7am-7pm After these hours, please refer to coverage provider listed on [email or web address] 08/01/2023, 2:58 PM

## 2023-08-01 NOTE — Progress Notes (Addendum)
 Patient had one unmeasured emesis occurrence; green emesis in appearance. RN gave PRN IV Zofran for nausea/vomiting.

## 2023-08-01 NOTE — Progress Notes (Signed)
 Daily Progress Note   Patient Name: Troy [name]       Date: 08/01/2023 DOB: 1955/10/09  Age: 68 y.o. MRN#: [id number] Attending Physician: Joseph Art, DO Primary Care Physician: [name], MD Admit Date: 07/28/2023  Reason for Consultation/Follow-up: Establishing goals of care  Subjective: Awake alert, resting in bed, hiccups ongoing but overall better.    Length of Stay: 4  Current Medications: Scheduled Meds:   famotidine  10 mg Oral Daily   ferrous sulfate  325 mg Oral Q breakfast   insulin aspart  0-5 Units Subcutaneous QHS   insulin aspart  0-6 Units Subcutaneous TID WC   morphine  15 mg Oral Q12H   pantoprazole  40 mg Oral BID   polyethylene glycol  17 g Oral Daily   senna-docusate  1 tablet Oral BID   sodium chloride flush  3 mL Intravenous Q12H   sodium chloride flush  3 mL Intravenous Q12H   sucralfate  1 g Oral BID   tamsulosin  0.4 mg Oral QPC supper    Continuous Infusions:  sodium chloride 75 mL/hr at 08/01/23 0820    PRN Meds: acetaminophen **OR** acetaminophen, HYDROcodone-acetaminophen, HYDROmorphone (DILAUDID) injection, lip balm, ondansetron **OR** ondansetron (ZOFRAN) IV, mouth rinse, sodium chloride flush  Physical Exam         Awake alert Has hiccups Appears with generalized weakness Trace edema Mild abdominal distension  Vital Signs: BP 126/68 (BP Location: Left Arm)   Pulse 91   Temp 97.8 F (36.6 C) (Oral)   Resp 18   Ht 5\' 10"  (1.778 m)   Wt 58.6 kg   SpO2 96%   BMI 18.54 kg/m  SpO2: SpO2: 96 % O2 Device: O2 Device: Room Air O2 Flow Rate:    Intake/output summary:  Intake/Output Summary (Last 24 hours) at 08/01/2023 1248 Last data filed at 08/01/2023 1117 Gross per 24 hour  Intake 463 ml  Output 555 ml  Net -92 ml   LBM:  Last BM Date : 07/28/23 Baseline Weight: Weight: 58.6 kg Most recent weight: Weight: 58.6 kg       Palliative Assessment/Data:      Patient Active Problem List   Diagnosis Date Noted   Abdominal pain 07/28/2023   Failure to thrive in adult 07/28/2023   Chronic idiopathic thrombocytopenia (HCC) 07/28/2023  Chronic anemia 07/28/2023   Candidemia (HCC) 06/18/2023   Fever 06/17/2023   History of hepatocellular carcinoma 06/16/2023   Infection due to Stenotrophomonas maltophilia 06/16/2023   Bacteremia due to Klebsiella pneumoniae 06/15/2023   Port or reservoir infection 06/15/2023   SIRS (systemic inflammatory response syndrome) (HCC) 06/11/2023   Cancer related pain 06/11/2023   Hyponatremia 06/11/2023   Severe sepsis (HCC) 04/21/2023   Lt inguinal pain 03/12/2023   Thrombus 03/11/2023   Hepatic encephalopathy (HCC) 11/22/2022   Acute hepatic encephalopathy (HCC) 11/21/2022   Hematuria 10/17/2022   Protein-calorie malnutrition, severe 10/17/2022   Acute GI bleeding 10/16/2022   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   Melena 09/10/2022   ABLA (acute blood loss anemia) 09/10/2022   Dehydration 04/22/2022   Tachycardia 04/22/2022   Hypercalcemia 04/22/2022   Thrombocytopenia (HCC) 04/22/2022   Decreased range of motion of neck 04/02/2022   Neck pain 04/02/2022   Nausea & vomiting 12/18/2021   Cancer, hepatocellular (HCC) 11/23/2021   Portal hypertensive gastropathy (HCC)    Secondary esophageal varices without bleeding (HCC)    Esophageal candidiasis (HCC)    Malnutrition of moderate degree 11/01/2021   Liver mass    Gastro-esophageal reflux disease without esophagitis 10/31/2021   Insulin dependent type 2 diabetes mellitus (HCC) 10/31/2021   Portal vein thrombosis with liver mass concerning for malignancy  10/31/2021   Cocaine abuse, uncomplicated (HCC) 01/21/2021   Stage 3b chronic kidney disease (HCC) 11/06/2020   Chronic low back pain 03/06/2020    Hyperlipidemia 03/06/2020   Essential hypertension 03/06/2020   Acute on chronic blood loss anemia 05/13/2019   Hepatic 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  History of recurrent GI bleed due to hepatic gastropathy and chronic blood loss anemia.  Additionally, diabetes hypertension and deficiency anemia chronic constipation. Patient follows with Ms. Wynonia Sours, NP colleagues at [facility], his medical oncologist is Dr. Sherron Monday. Patient admitted to the hospital with abdominal pain, chronic pain syndrome cancer-related pain.  Patient continues to have ongoing decline in functional status essentially since the last 6 to 8 weeks.  Patient with failure to thrive, advanced stage IV hepatocellular carcinoma.  Patient with worsening kidney function as well.  Recent CT imaging is reviewed. Palliative consult for goals of care discussions has been requested.  Recommendations/Plan: DNR DNI Home with hospice discussions are undergoing.  Continue current mode of care.     Code Status:    Code Status Orders  (From admission, onward)           Start     Ordered   07/30/23 1607  Do not attempt resuscitation (DNR) Pre-Arrest Interventions Desired  (Code Status)  Continuous       Question Answer Comment  If pulseless and not breathing No CPR or chest compressions.   In Pre-Arrest Conditions (Patient Has Pulse and Is Breathing) May intubate, use advanced airway interventions and cardioversion/ACLS medications if appropriate or indicated. May transfer to ICU.   Consent: Discussion documented in EHR or advanced  directives reviewed      07/30/23 1606           Code Status History  Date Active Date Inactive Code Status Order ID Comments User Context   07/28/2023 2345 07/30/2023 1606 Full Code [id number]  Tereasa Coop, MD ED   06/11/2023 2145 06/20/2023 1709 Full Code [id number]  Charlsie Quest, MD ED   04/21/2023 1949 04/24/2023 1745 Full Code [id number]  Almon Hercules, MD ED   03/12/2023 0106 03/12/2023 1722 Full Code [id number]  Anselm Jungling, DO ED   11/21/2022 2243 11/25/2022 1938 Full Code [id number]  Gery Pray, MD ED   10/16/2022 1536 10/19/2022 1915 Full Code [id number]  Lorin Glass, MD Inpatient   04/22/2022 2153 04/25/2022 2000 Full Code [id number]  John Giovanni, MD ED   12/18/2021 2353 12/19/2021 1907 Full Code [id number]  Synetta Fail, MD ED   10/31/2021 1944 11/04/2021 2028 Full Code [id number]  Orland Mustard, MD ED       Prognosis:  < 6 months  Discharge Planning: Home with Hospice  Care plan was discussed with patient   Thank you for allowing the Palliative Medicine Team to assist in the care of this patient. mod MDM     Greater than 50%  of this time was spent counseling and coordinating care related to the above assessment and plan.  Rosalin Hawking, MD  Please contact Palliative Medicine Team phone at [phone] for questions and concerns.

## 2023-08-02 DIAGNOSIS — R627 Adult failure to thrive: Secondary | ICD-10-CM | POA: Diagnosis not present

## 2023-08-02 DIAGNOSIS — Z515 Encounter for palliative care: Secondary | ICD-10-CM | POA: Diagnosis not present

## 2023-08-02 DIAGNOSIS — C220 Liver cell carcinoma: Secondary | ICD-10-CM | POA: Diagnosis not present

## 2023-08-02 DIAGNOSIS — R180 Malignant ascites: Secondary | ICD-10-CM | POA: Diagnosis not present

## 2023-08-02 LAB — CBC
HCT: 24.6 % — ABNORMAL LOW (ref 39.0–52.0)
Hemoglobin: 7.7 g/dL — ABNORMAL LOW (ref 13.0–17.0)
MCH: 30.7 pg (ref 26.0–34.0)
MCHC: 31.3 g/dL (ref 30.0–36.0)
MCV: 98 fL (ref 80.0–100.0)
Platelets: 103 10*3/uL — ABNORMAL LOW (ref 150–400)
RBC: 2.51 MIL/uL — ABNORMAL LOW (ref 4.22–5.81)
RDW: 16.6 % — ABNORMAL HIGH (ref 11.5–15.5)
WBC: 5.2 10*3/uL (ref 4.0–10.5)
nRBC: 0 % (ref 0.0–0.2)

## 2023-08-02 LAB — GLUCOSE, CAPILLARY
Glucose-Capillary: 168 mg/dL — ABNORMAL HIGH (ref 70–99)
Glucose-Capillary: 160 mg/dL — ABNORMAL HIGH (ref 70–99)
Glucose-Capillary: 160 mg/dL — ABNORMAL HIGH (ref 70–99)
Glucose-Capillary: 157 mg/dL — ABNORMAL HIGH (ref 70–99)

## 2023-08-02 LAB — BASIC METABOLIC PANEL
Anion gap: 12 (ref 5–15)
BUN: 44 mg/dL — ABNORMAL HIGH (ref 8–23)
CO2: 18 mmol/L — ABNORMAL LOW (ref 22–32)
Calcium: 9 mg/dL (ref 8.9–10.3)
Chloride: 105 mmol/L (ref 98–111)
Creatinine, Ser: 1.84 mg/dL — ABNORMAL HIGH (ref 0.61–1.24)
GFR, Estimated: 39 mL/min — ABNORMAL LOW (ref >60)
Glucose, Bld: 178 mg/dL — ABNORMAL HIGH (ref 70–99)
Potassium: 4.4 mmol/L (ref 3.5–5.1)
Sodium: 135 mmol/L (ref 135–145)

## 2023-08-02 LAB — BODY FLUID CULTURE W GRAM STAIN: Culture: NO GROWTH

## 2023-08-02 MED ORDER — ALBUMIN HUMAN 25 % IV SOLN
12.5000 g | Freq: Once | INTRAVENOUS | Status: AC
  Administered 2023-08-03: 12.5 g via INTRAVENOUS
  Filled 2023-08-02: qty 50

## 2023-08-02 MED ORDER — SODIUM CHLORIDE 0.9 % IV SOLN: INTRAVENOUS | Status: AC

## 2023-08-02 NOTE — Progress Notes (Signed)
 Daily Progress Note   Patient Name: Troy [name]       Date: 08/02/2023 DOB: 1955/12/16  Age: 68 y.o. MRN#: [id number] Attending Physician: Joseph Art, DO Primary Care Physician: [name], MD Admit Date: 07/28/2023  Reason for Consultation/Follow-up: Establishing goals of care  Subjective: Awake alert, resting in bed, was nauseous earlier this morning, but now feeling okay.    Length of Stay: 5  Current Medications: Scheduled Meds:   famotidine  10 mg Oral Daily   ferrous sulfate  325 mg Oral Q breakfast   insulin aspart  0-5 Units Subcutaneous QHS   insulin aspart  0-6 Units Subcutaneous TID WC   oxyCODONE  15 mg Oral Q12H   pantoprazole  40 mg Oral BID   polyethylene glycol  17 g Oral Daily   senna-docusate  1 tablet Oral BID   sodium chloride flush  3 mL Intravenous Q12H   sodium chloride flush  3 mL Intravenous Q12H   sucralfate  1 g Oral BID   tamsulosin  0.4 mg Oral QPC supper    Continuous Infusions:  sodium chloride 75 mL/hr at 08/02/23 0954    PRN Meds: acetaminophen **OR** acetaminophen, HYDROcodone-acetaminophen, HYDROmorphone (DILAUDID) injection, lip balm, ondansetron **OR** ondansetron (ZOFRAN) IV, mouth rinse, sodium chloride flush  Physical Exam         Awake alert Has hiccups Appears with generalized weakness Trace edema Mild abdominal distension  Vital Signs: BP 136/71 (BP Location: Left Arm)   Pulse (!) 102   Temp 97.8 F (36.6 C) (Oral)   Resp 15   Ht 5\' 10"  (1.778 m)   Wt 58.6 kg   SpO2 100%   BMI 18.54 kg/m  SpO2: SpO2: 100 % O2 Device: O2 Device: Room Air O2 Flow Rate:    Intake/output summary:  Intake/Output Summary (Last 24 hours) at 08/02/2023 1230 Last data filed at 08/02/2023 1044 Gross per 24 hour  Intake 1948.07 ml   Output 525 ml  Net 1423.07 ml   LBM: Last BM Date : 08/02/23 Baseline Weight: Weight: 58.6 kg Most recent weight: Weight: 58.6 kg       Palliative Assessment/Data:      Patient Active Problem List   Diagnosis Date Noted   Abdominal pain 07/28/2023   Failure to thrive in adult 07/28/2023   Chronic  idiopathic thrombocytopenia (HCC) 07/28/2023   Chronic anemia 07/28/2023   Candidemia (HCC) 06/18/2023   Fever 06/17/2023   History of hepatocellular carcinoma 06/16/2023   Infection due to Stenotrophomonas maltophilia 06/16/2023   Bacteremia due to Klebsiella pneumoniae 06/15/2023   Port or reservoir infection 06/15/2023   SIRS (systemic inflammatory response syndrome) (HCC) 06/11/2023   Cancer related pain 06/11/2023   Hyponatremia 06/11/2023   Severe sepsis (HCC) 04/21/2023   Lt inguinal pain 03/12/2023   Thrombus 03/11/2023   Hepatic encephalopathy (HCC) 11/22/2022   Acute hepatic encephalopathy (HCC) 11/21/2022   Hematuria 10/17/2022   Protein-calorie malnutrition, severe 10/17/2022   Acute GI bleeding 10/16/2022   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   Melena 09/10/2022   ABLA (acute blood loss anemia) 09/10/2022   Dehydration 04/22/2022   Tachycardia 04/22/2022   Hypercalcemia 04/22/2022   Thrombocytopenia (HCC) 04/22/2022   Decreased range of motion of neck 04/02/2022   Neck pain 04/02/2022   Nausea & vomiting 12/18/2021   Cancer, hepatocellular (HCC) 11/23/2021   Portal hypertensive gastropathy (HCC)    Secondary esophageal varices without bleeding (HCC)    Esophageal candidiasis (HCC)    Malnutrition of moderate degree 11/01/2021   Liver mass    Gastro-esophageal reflux disease without esophagitis 10/31/2021   Insulin dependent type 2 diabetes mellitus (HCC) 10/31/2021   Portal vein thrombosis with liver mass concerning for malignancy  10/31/2021   Cocaine abuse, uncomplicated (HCC) 01/21/2021   Stage 3b chronic kidney disease (HCC) 11/06/2020    Chronic low back pain 03/06/2020   Hyperlipidemia 03/06/2020   Essential hypertension 03/06/2020   Acute on chronic blood loss anemia 05/13/2019   Hepatic 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  History of recurrent GI bleed due to hepatic gastropathy and chronic blood loss anemia.  Additionally, diabetes hypertension and deficiency anemia chronic constipation. Patient follows with Ms. Wynonia Sours, NP colleagues at [facility], his medical oncologist is Dr. Sherron Monday. Patient admitted to the hospital with abdominal pain, chronic pain syndrome cancer-related pain.  Patient continues to have ongoing decline in functional status essentially since the last 6 to 8 weeks.  Patient with failure to thrive, advanced stage IV hepatocellular carcinoma.  Patient with worsening kidney function as well.  Recent CT imaging is reviewed. Palliative consult for goals of care discussions has been requested.  Recommendations/Plan: DNR DNI Home with hospice discussions are undergoing: recommend home with hospice.  Continue current mode of care.     Code Status:    Code Status Orders  (From admission, onward)           Start     Ordered   07/30/23 1607  Do not attempt resuscitation (DNR) Pre-Arrest Interventions Desired  (Code Status)  Continuous       Question Answer Comment  If pulseless and not breathing No CPR or chest compressions.   In Pre-Arrest Conditions (Patient Has Pulse and Is Breathing) May intubate, use advanced airway interventions and cardioversion/ACLS medications if appropriate or indicated. May  transfer to ICU.   Consent: Discussion documented in EHR or advanced directives reviewed      07/30/23 1606  Code Status History     Date Active Date Inactive Code Status Order ID Comments User Context   07/28/2023 2345 07/30/2023 1606 Full Code [id number]  Tereasa Coop, MD ED   06/11/2023 2145 06/20/2023 1709 Full Code [id number]  Charlsie Quest, MD ED   04/21/2023 1949 04/24/2023 1745 Full Code [id number]  Almon Hercules, MD ED   03/12/2023 0106 03/12/2023 1722 Full Code [id number]  Anselm Jungling, DO ED   11/21/2022 2243 11/25/2022 1938 Full Code [id number]  Gery Pray, MD ED   10/16/2022 1536 10/19/2022 1915 Full Code [id number]  Lorin Glass, MD Inpatient   04/22/2022 2153 04/25/2022 2000 Full Code [id number]  John Giovanni, MD ED   12/18/2021 2353 12/19/2021 1907 Full Code [id number]  Synetta Fail, MD ED   10/31/2021 1944 11/04/2021 2028 Full Code [id number]  Orland Mustard, MD ED       Prognosis:  < 6 months  Discharge Planning: Home with Hospice  Care plan was discussed with patient   Thank you for allowing the Palliative Medicine Team to assist in the care of this patient. mod MDM     Greater than 50%  of this time was spent counseling and coordinating care related to the above assessment and plan.  Rosalin Hawking, MD  Please contact Palliative Medicine Team phone at [phone] for questions and concerns.

## 2023-08-02 NOTE — Progress Notes (Signed)
 PROGRESS NOTE    Troy [name]  QIO:[id number] DOB: February 28, 1956 DOA: 07/28/2023 PCP: [name], MD    Brief Narrative:  Troy [name] is a 68 y.o. male with medical history significant of stage IV hepatocellular carcinoma status post radiation and TREC 2023 currently on chemotherapy, hepatic cirrhosis with esophageal varices, chronic thrombocytopenia, chronic portal vein thrombosis, history of recurrent GI bleed due to hepatic gastropathy, chronic blood loss anemia, insulin-dependent DM type II, essential hypertension, iron deficiency anemia, chronic constipation and chronic hypokalemia presented emergency department complaining of abdominal pain.  Found to have malignant ascites and family considering hospice.   Assessment and Plan: Suspected malignant ascites in advanced stage IV hepatocellular carcinoma Hepatic cirrhosis with esophageal varices Portal hypertensive gastropathy - Patient presenting with acute on chronic abdominal pain without any nausea and vomiting.  Denies any fever, chill constipation or diarrhea.  Patient has a stage IV liver cell carcinoma currently ongoing radiation therapy.  Last radiation treatment 07/22/2023.  Reported since the treatment patient has significantly worsening abdominal pain. -CT abdomen pelvis showing extensive metastatic lesion and stage IV hepatocellular carcinoma.  New thrombosis of the perigastric and perisplenic varices.  Chronic portal vein thrombosis. -s/p thoracentesis-- start IV albumin  - Continue scheduled MS Contin 50 mg twice daily and continue Norco as needed for breakthrough pain, PRN IV meds -oncology: Due to his rapid cancer progression, and significantly decreased performance status, and multiple cancer related symptoms, I again recommend hospice home care.  I discussed the logistics, benefit and limitation of hospice care.  -palliative care following  -family waiting to speak with [facility] to make decision -plan for therapeutic  paracentesis ordered for the AM   Chronic portal vein thrombosis New perigastric and and perisplenic varices -CT abdomen pelvis showing chronic portal vein thrombosis.  New perigastric and perisplenic varices thrombosis as well. -Given patient has history of chronic blood loss anemia in the context of protal hypertensive gastropathy and and esophageal varices which is limiting the use of anticoagulation in the past.  any IV anticoagulation as of now as high risk of bleeding/has anemia -unable to tolerate the  low-dose propranolol 20 mg twice daily due to hypotension   Anemia of chronic disease - Hemoglobin is around 10.8 to 8 weeks ago.  Baseline hemoglobin between 8-10.  Continue oral iron supplements. transfused PRBC and monitor  AKI-- >?hepatorenal syndrome -IVF -renal U/S did not show hydronephrosis Wife says patient is urinating more  Decompensated hepatic cirrhosis Chronic thrombocytopenia -Same showing chronically elevated AST/ALT.  -trend   Insulin-dependent DM type II -Patient has poor oral intake of food due to abdominal pain.  Holding metformin and Jardiance to prevent hypoglycemia.' -IVF   GERD - Continue Protonix and sucralfate.   Chronic constipation -Continue MiraLAX and Senokot.  ?acute urinary retention vs ascites -appears to be urinating on own    DVT prophylaxis: SCDs Start: 07/28/23 2347 Place TED hose Start: 07/28/23 2347    Code Status: Do not attempt resuscitation (DNR) PRE-ARREST INTERVENTIONS DESIRED Family Communication: at bedside  Disposition Plan:  Level of care: Progressive Status is: Inpatient     Consultants:  Oncology IR- paracentesis Palliative care  Subjective: Says he occasionally has vomiting but able to swallow ok  Objective: Vitals:   08/01/23 2039 08/02/23 0420 08/02/23 0901 08/02/23 1213  BP: 111/61 121/65 120/64 136/71  Pulse: (!) 102 98 (!) 101 (!) 102  Resp:    15  Temp: 98.1 F (36.7 C) 98 F (36.7 C) 98.4  F (36.9 C)  97.8 F (36.6 C)  TempSrc: Oral Oral Oral Oral  SpO2: 99% 99% 98% 100%  Weight:      Height:        Intake/Output Summary (Last 24 hours) at 08/02/2023 1340 Last data filed at 08/02/2023 1044 Gross per 24 hour  Intake 1948.07 ml  Output 525 ml  Net 1423.07 ml   Filed Weights   07/28/23 1802 07/29/23 0041  Weight: 58.6 kg 58.6 kg    Examination:   General: Appearance:    Thin male who appears uncomfortable     Lungs:     respirations unlabored  Heart:    Tachycardic.    MS:   All extremities are intact.    Neurologic:   Awake, alert       Data Reviewed: I have personally reviewed following labs and imaging studies  CBC: Recent Labs  Lab 07/28/23 1925 07/29/23 0626 07/30/23 0607 07/31/23 0332 07/31/23 1544 08/01/23 0413 08/02/23 0342  WBC 5.7 6.9 7.6 6.0  --  5.6 5.2  NEUTROABS 4.6  --   --   --   --   --   --   HGB 8.8* 7.6* 8.3* 6.9* 8.1* 7.4* 7.7*  HCT 27.5* 24.2* 26.4* 21.7* 25.2* 23.5* 24.6*  MCV 96.2 96.4 96.7 97.3  --  95.9 98.0  PLT 172 149* 160 114*  --  104* 103*   Basic Metabolic Panel: Recent Labs  Lab 07/29/23 0626 07/30/23 0607 07/31/23 0332 08/01/23 0413 08/02/23 0342  NA 134* 132* 132* 134* 135  K 3.8 4.2 4.0 4.1 4.4  CL 103 102 102 104 105  CO2 22 23 21* 19* 18*  GLUCOSE 162* 182* 162* 162* 178*  BUN 20 26* 32* 44* 44*  CREATININE 0.95 1.41* 1.76* 2.10* 1.84*  CALCIUM 8.7* 9.0 8.9 8.8* 9.0   GFR: Estimated Creatinine Clearance: 31.4 mL/min (A) (by C-G formula based on SCr of 1.84 mg/dL (H)). Liver Function Tests: Recent Labs  Lab 07/28/23 2015 07/29/23 0626 07/31/23 0332  AST 53* 50* 68*  ALT 18 17 18   ALKPHOS 193* 170* 131*  BILITOT 1.1 0.8 1.4*  PROT 7.5 6.7 7.0  ALBUMIN 2.1* 2.0* 3.1*   No results for input(s): "LIPASE", "AMYLASE" in the last 168 hours. No results for input(s): "AMMONIA" in the last 168 hours. Coagulation Profile: No results for input(s): "INR", "PROTIME" in the last 168  hours. Cardiac Enzymes: No results for input(s): "CKTOTAL", "CKMB", "CKMBINDEX", "TROPONINI" in the last 168 hours. BNP (last 3 results) No results for input(s): "PROBNP" in the last 8760 hours. HbA1C: No results for input(s): "HGBA1C" in the last 72 hours. CBG: Recent Labs  Lab 08/01/23 1209 08/01/23 1700 08/01/23 2041 08/02/23 0733 08/02/23 1215  GLUCAP 119* 138* 158* 168* 160*   Lipid Profile: No results for input(s): "CHOL", "HDL", "LDLCALC", "TRIG", "CHOLHDL", "LDLDIRECT" in the last 72 hours. Thyroid Function Tests: No results for input(s): "TSH", "T4TOTAL", "FREET4", "T3FREE", "THYROIDAB" in the last 72 hours. Anemia Panel: No results for input(s): "VITAMINB12", "FOLATE", "FERRITIN", "TIBC", "IRON", "RETICCTPCT" in the last 72 hours. Sepsis Labs: No results for input(s): "PROCALCITON", "LATICACIDVEN" in the last 168 hours.  Recent Results (from the past 240 hours)  Resp panel by RT-PCR (RSV, Flu A&B, Covid) Anterior Nasal Swab     Status: None   Collection Time: 07/28/23  6:41 PM   Specimen: Anterior Nasal Swab  Result Value Ref Range Status   SARS Coronavirus 2 by RT PCR NEGATIVE NEGATIVE Final    Comment: (  NOTE) SARS-CoV-2 target nucleic acids are NOT DETECTED.  The SARS-CoV-2 RNA is generally detectable in upper respiratory specimens during the acute phase of infection. The lowest concentration of SARS-CoV-2 viral copies this assay can detect is 138 copies/mL. A negative result does not preclude SARS-Cov-2 infection and should not be used as the sole basis for treatment or other patient management decisions. A negative result may occur with  improper specimen collection/handling, submission of specimen other than nasopharyngeal swab, presence of viral mutation(s) within the areas targeted by this assay, and inadequate number of viral copies(<138 copies/mL). A negative result must be combined with clinical observations, patient history, and  epidemiological information. The expected result is Negative.  Fact Sheet for Patients:  BloggerCourse.com  Fact Sheet for Healthcare Providers:  SeriousBroker.it  This test is no t yet approved or cleared by the [place] FDA and  has been authorized for detection and/or diagnosis of SARS-CoV-2 by FDA under an Emergency Use Authorization (EUA). This EUA will remain  in effect (meaning this test can be used) for the duration of the COVID-19 declaration under Section 564(b)(1) of the Act, 21 U.S.C.section 360bbb-3(b)(1), unless the authorization is terminated  or revoked sooner.       Influenza A by PCR NEGATIVE NEGATIVE Final   Influenza B by PCR NEGATIVE NEGATIVE Final    Comment: (NOTE) The Xpert Xpress SARS-CoV-2/FLU/RSV plus assay is intended as an aid in the diagnosis of influenza from Nasopharyngeal swab specimens and should not be used as a sole basis for treatment. Nasal washings and aspirates are unacceptable for Xpert Xpress SARS-CoV-2/FLU/RSV testing.  Fact Sheet for Patients: BloggerCourse.com  Fact Sheet for Healthcare Providers: SeriousBroker.it  This test is not yet approved or cleared by the [place] FDA and has been authorized for detection and/or diagnosis of SARS-CoV-2 by FDA under an Emergency Use Authorization (EUA). This EUA will remain in effect (meaning this test can be used) for the duration of the COVID-19 declaration under Section 564(b)(1) of the Act, 21 U.S.C. section 360bbb-3(b)(1), unless the authorization is terminated or revoked.     Resp Syncytial Virus by PCR NEGATIVE NEGATIVE Final    Comment: (NOTE) Fact Sheet for Patients: BloggerCourse.com  Fact Sheet for Healthcare Providers: SeriousBroker.it  This test is not yet approved or cleared by the [place] FDA and has been  authorized for detection and/or diagnosis of SARS-CoV-2 by FDA under an Emergency Use Authorization (EUA). This EUA will remain in effect (meaning this test can be used) for the duration of the COVID-19 declaration under Section 564(b)(1) of the Act, 21 U.S.C. section 360bbb-3(b)(1), unless the authorization is terminated or revoked.  Performed at [facility], [address] [place], [place] [address]   Body fluid culture w Gram Stain     Status: None (Preliminary result)   Collection Time: 07/30/23 12:24 PM   Specimen: PATH Cytology Peritoneal fluid  Result Value Ref Range Status   Specimen Description   Final    PERITONEAL Performed at [facility], [address] [place], [place] [address]    Special Requests   Final    NONE Performed at [facility], [address], [place], [place] [address]    Gram Stain   Final    FEW WBC PRESENT, PREDOMINANTLY PMN NO ORGANISMS SEEN    Culture   Final    NO GROWTH 3 DAYS Performed at [facility], [address], [place], [place] [address]  Report Status PENDING  Incomplete         Radiology Studies: US RENAL Result Date: 07/31/2023 CLINICAL DATA:  Acute kidney injury EXAM: RENAL / URINARY TRACT ULTRASOUND COMPLETE COMPARISON:  CT 07/28/2023 FINDINGS: Right Kidney: Renal measurements: 10.9 x 4.6 x 4.7 cm = volume: 124.7 mL. Cortex appears echogenic. No mass or hydronephrosis. Left Kidney: Renal measurements: 12.1 x 5.7 x 5.8 cm = volume: 208.9 mL. Echogenicity within normal limits. No mass or hydronephrosis visualized. Bladder: Appears normal for degree of bladder distention. Other: Liver cirrhosis. At least moderate ascites in the right upper quadrant IMPRESSION: 1. Negative for hydronephrosis. Echogenic right renal cortex as can be seen with medical renal disease. 2. Liver cirrhosis with at least moderate ascites in the right upper quadrant. Electronically Signed   By:  Jasmine Pang M.D.   On: 07/31/2023 19:06        Scheduled Meds:  famotidine  10 mg Oral Daily   ferrous sulfate  325 mg Oral Q breakfast   insulin aspart  0-5 Units Subcutaneous QHS   insulin aspart  0-6 Units Subcutaneous TID WC   oxyCODONE  15 mg Oral Q12H   pantoprazole  40 mg Oral BID   polyethylene glycol  17 g Oral Daily   senna-docusate  1 tablet Oral BID   sodium chloride flush  3 mL Intravenous Q12H   sodium chloride flush  3 mL Intravenous Q12H   sucralfate  1 g Oral BID   tamsulosin  0.4 mg Oral QPC supper   Continuous Infusions:  sodium chloride 75 mL/hr at 08/02/23 0954   [START ON 08/03/2023] albumin human        LOS: 5 days    Time spent: 45 minutes spent on chart review, discussion with nursing staff, consultants, updating family and interview/physical exam; more than 50% of that time was spent in counseling and/or coordination of care.    Joseph Art, DO [facility] Available via Epic secure chat 7am-7pm After these hours, please refer to coverage provider listed on [email or web address] 08/02/2023, 1:40 PM

## 2023-08-03 ENCOUNTER — Inpatient Hospital Stay (HOSPITAL_COMMUNITY)

## 2023-08-03 DIAGNOSIS — E860 Dehydration: Secondary | ICD-10-CM | POA: Diagnosis not present

## 2023-08-03 DIAGNOSIS — C220 Liver cell carcinoma: Secondary | ICD-10-CM | POA: Diagnosis not present

## 2023-08-03 DIAGNOSIS — Z515 Encounter for palliative care: Secondary | ICD-10-CM

## 2023-08-03 DIAGNOSIS — R627 Adult failure to thrive: Secondary | ICD-10-CM | POA: Diagnosis not present

## 2023-08-03 LAB — GLUCOSE, CAPILLARY
Glucose-Capillary: 174 mg/dL — ABNORMAL HIGH (ref 70–99)
Glucose-Capillary: 153 mg/dL — ABNORMAL HIGH (ref 70–99)
Glucose-Capillary: 153 mg/dL — ABNORMAL HIGH (ref 70–99)
Glucose-Capillary: 169 mg/dL — ABNORMAL HIGH (ref 70–99)

## 2023-08-03 LAB — BASIC METABOLIC PANEL
Anion gap: 12 (ref 5–15)
BUN: 36 mg/dL — ABNORMAL HIGH (ref 8–23)
CO2: 16 mmol/L — ABNORMAL LOW (ref 22–32)
Calcium: 8.5 mg/dL — ABNORMAL LOW (ref 8.9–10.3)
Chloride: 110 mmol/L (ref 98–111)
Creatinine, Ser: 1.58 mg/dL — ABNORMAL HIGH (ref 0.61–1.24)
GFR, Estimated: 47 mL/min — ABNORMAL LOW (ref >60)
Glucose, Bld: 171 mg/dL — ABNORMAL HIGH (ref 70–99)
Potassium: 4.4 mmol/L (ref 3.5–5.1)
Sodium: 138 mmol/L (ref 135–145)

## 2023-08-03 MED ORDER — ALBUMIN HUMAN 25 % IV SOLN
12.5000 g | Freq: Once | INTRAVENOUS | Status: AC
  Administered 2023-08-03: 12.5 g via INTRAVENOUS
  Filled 2023-08-03: qty 50

## 2023-08-03 MED ORDER — LIDOCAINE HCL 1 % IJ SOLN
INTRAMUSCULAR | Status: AC
  Filled 2023-08-03: qty 20

## 2023-08-03 NOTE — Progress Notes (Signed)
 Daily Progress Note   Patient Name: Troy [name]       Date: 08/03/2023 DOB: 10/22/1955  Age: 68 y.o. MRN#: [id number] Attending Physician: Joseph Art, DO Primary Care Physician: [name], MD Admit Date: 07/28/2023  Reason for Consultation/Follow-up: Establishing goals of care  Subjective: Awake alert, resting in bed, awaiting another paracentesis, wife at bedside, they celebrated their 51 th wedding anniversary over the weekend, several of the patient's siblings also came in and visited with him in the hospital over the weekend.    Length of Stay: 6  Current Medications: Scheduled Meds:   famotidine  10 mg Oral Daily   ferrous sulfate  325 mg Oral Q breakfast   insulin aspart  0-5 Units Subcutaneous QHS   insulin aspart  0-6 Units Subcutaneous TID WC   oxyCODONE  15 mg Oral Q12H   pantoprazole  40 mg Oral BID   polyethylene glycol  17 g Oral Daily   senna-docusate  1 tablet Oral BID   sodium chloride flush  3 mL Intravenous Q12H   sodium chloride flush  3 mL Intravenous Q12H   sucralfate  1 g Oral BID   tamsulosin  0.4 mg Oral QPC supper    Continuous Infusions:  albumin human      PRN Meds: acetaminophen **OR** acetaminophen, HYDROcodone-acetaminophen, HYDROmorphone (DILAUDID) injection, lip balm, ondansetron **OR** ondansetron (ZOFRAN) IV, mouth rinse, sodium chloride flush  Physical Exam         Awake alert Has hiccups Appears with generalized weakness Trace edema Mild abdominal distension  Vital Signs: BP (!) 115/56   Pulse (!) 108   Temp 98.2 F (36.8 C) (Oral)   Resp 20   Ht 5\' 10"  (1.778 m)   Wt 58.6 kg   SpO2 100%   BMI 18.54 kg/m  SpO2: SpO2: 100 % O2 Device: O2 Device: Room Air O2 Flow Rate:    Intake/output summary:  Intake/Output Summary  (Last 24 hours) at 08/03/2023 1223 Last data filed at 08/03/2023 1202 Gross per 24 hour  Intake 920.9 ml  Output 575 ml  Net 345.9 ml   LBM: Last BM Date : 08/02/23 Baseline Weight: Weight: 58.6 kg Most recent weight: Weight: 58.6 kg       Palliative Assessment/Data:      Patient Active Problem List  Diagnosis Date Noted   Abdominal pain 07/28/2023   Failure to thrive in adult 07/28/2023   Chronic idiopathic thrombocytopenia (HCC) 07/28/2023   Chronic anemia 07/28/2023   Candidemia (HCC) 06/18/2023   Fever 06/17/2023   History of hepatocellular carcinoma 06/16/2023   Infection due to Stenotrophomonas maltophilia 06/16/2023   Bacteremia due to Klebsiella pneumoniae 06/15/2023   Port or reservoir infection 06/15/2023   SIRS (systemic inflammatory response syndrome) (HCC) 06/11/2023   Cancer related pain 06/11/2023   Hyponatremia 06/11/2023   Severe sepsis (HCC) 04/21/2023   Lt inguinal pain 03/12/2023   Thrombus 03/11/2023   Hepatic encephalopathy (HCC) 11/22/2022   Acute hepatic encephalopathy (HCC) 11/21/2022   Hematuria 10/17/2022   Protein-calorie malnutrition, severe 10/17/2022   Acute GI bleeding 10/16/2022   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   Melena 09/10/2022   ABLA (acute blood loss anemia) 09/10/2022   Dehydration 04/22/2022   Tachycardia 04/22/2022   Hypercalcemia 04/22/2022   Thrombocytopenia (HCC) 04/22/2022   Decreased range of motion of neck 04/02/2022   Neck pain 04/02/2022   Nausea & vomiting 12/18/2021   Cancer, hepatocellular (HCC) 11/23/2021   Portal hypertensive gastropathy (HCC)    Secondary esophageal varices without bleeding (HCC)    Esophageal candidiasis (HCC)    Malnutrition of moderate degree 11/01/2021   Liver mass    Gastro-esophageal reflux disease without esophagitis 10/31/2021   Insulin dependent type 2 diabetes mellitus (HCC) 10/31/2021   Portal vein thrombosis with liver mass concerning for malignancy   10/31/2021   Cocaine abuse, uncomplicated (HCC) 01/21/2021   Stage 3b chronic kidney disease (HCC) 11/06/2020   Chronic low back pain 03/06/2020   Hyperlipidemia 03/06/2020   Essential hypertension 03/06/2020   Acute on chronic blood loss anemia 05/13/2019   Hepatic 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  History of recurrent GI bleed due to hepatic gastropathy and chronic blood loss anemia.  Additionally, diabetes hypertension and deficiency anemia chronic constipation. Patient follows with Ms. Wynonia Sours, NP colleagues at [facility], his medical oncologist is Dr. Sherron Monday. Patient admitted to the hospital with abdominal pain, chronic pain syndrome cancer-related pain.  Patient continues to have ongoing decline in functional status essentially since the last 6 to 8 weeks.  Patient with failure to thrive, advanced stage IV hepatocellular carcinoma.  Patient with worsening kidney function as well.  Recent CT imaging is reviewed. Palliative consult for goals of care discussions has been requested.  Recommendations/Plan: DNR DNI Home with hospice discussions are undergoing: recommend home with hospice. Wife looking forward to speaking with hospice liaison today.  Continue current mode of care.     Code Status:    Code Status Orders  (From admission, onward)           Start     Ordered   07/30/23 1607  Do not attempt resuscitation (DNR) Pre-Arrest Interventions Desired  (Code Status)  Continuous       Question Answer Comment  If pulseless and not breathing No CPR or chest compressions.   In  Pre-Arrest Conditions (Patient Has Pulse and Is Breathing) May intubate, use advanced airway interventions and cardioversion/ACLS medications if appropriate or  indicated. May transfer to ICU.   Consent: Discussion documented in EHR or advanced directives reviewed      07/30/23 1606           Code Status History     Date Active Date Inactive Code Status Order ID Comments User Context   07/28/2023 2345 07/30/2023 1606 Full Code [id number]  Tereasa Coop, MD ED   06/11/2023 2145 06/20/2023 1709 Full Code [id number]  Charlsie Quest, MD ED   04/21/2023 1949 04/24/2023 1745 Full Code [id number]  Almon Hercules, MD ED   03/12/2023 0106 03/12/2023 1722 Full Code [id number]  Anselm Jungling, DO ED   11/21/2022 2243 11/25/2022 1938 Full Code [id number]  Gery Pray, MD ED   10/16/2022 1536 10/19/2022 1915 Full Code [id number]  Lorin Glass, MD Inpatient   04/22/2022 2153 04/25/2022 2000 Full Code [id number]  John Giovanni, MD ED   12/18/2021 2353 12/19/2021 1907 Full Code [id number]  Synetta Fail, MD ED   10/31/2021 1944 11/04/2021 2028 Full Code [id number]  Orland Mustard, MD ED       Prognosis:  < 6 months  Discharge Planning: Home with Hospice  Care plan was discussed with patient and wife.   Thank you for allowing the Palliative Medicine Team to assist in the care of this patient. mod MDM     Greater than 50%  of this time was spent counseling and coordinating care related to the above assessment and plan.  Rosalin Hawking, MD  Please contact Palliative Medicine Team phone at [phone] for questions and concerns.

## 2023-08-03 NOTE — Progress Notes (Signed)
 Came back to check on patient s/p paracentesis.  3L removed.  Wife not at  beside and patient sleeping-- did not wake him up JV

## 2023-08-03 NOTE — TOC Progression Note (Signed)
 Transition of Care [facility]) - Progression Note   Patient Details  Name: Troy [name] MRN: [id number] Date of Birth: 02/15/1956  [facility]) CM/SW Contact  Ewing Schlein, LCSW Phone Number: 08/03/2023, 2:52 PM  Clinical Narrative: CSW followed up with Cheri with [facility] to see if patient's wife is now agreeable to discharging home with hospice. Per Dennard Nip, wife is not ready to accept hospice, but has discussed the patient's situation with family. [facility] can follow the patient for palliative even though he is more appropriate for hospice and would benefit from the additional support from hospice. Treatment team updated regarding wife's indecision with hospice at this time.  Expected Discharge Plan:  (TBD) Barriers to Discharge: Continued Medical Work up  Expected Discharge Plan and Services In-house Referral: Clinical Social Work Living arrangements for the past 2 months: Single Family Home  Social Determinants of Health (SDOH) Interventions SDOH Screenings   Food Insecurity: No Food Insecurity (07/29/2023)  Housing: Low Risk  (07/29/2023)  Transportation Needs: No Transportation Needs (07/29/2023)  Utilities: Not At Risk (07/29/2023)  Social Connections: Socially Integrated (07/29/2023)  Tobacco Use: Medium Risk (07/29/2023)   Readmission Risk Interventions    07/31/2023    9:36 AM  Readmission Risk Prevention Plan  Transportation Screening Complete  Medication Review (RN Care Manager) Complete  HRI or Home Care Consult Complete  SW Recovery Care/Counseling Consult Complete  Palliative Care Screening Complete  Skilled Nursing Facility Not Applicable

## 2023-08-03 NOTE — Progress Notes (Signed)
 PROGRESS NOTE    Troy [name]  WUJ:[id number] DOB: Nov 02, 1955 DOA: 07/28/2023 PCP: [name], MD    Brief Narrative:  Troy [name] is a 69 y.o. male with medical history significant of stage IV hepatocellular carcinoma status post radiation and TREC 2023 currently on chemotherapy, hepatic cirrhosis with esophageal varices, chronic thrombocytopenia, chronic portal vein thrombosis, history of recurrent GI bleed due to hepatic gastropathy, chronic blood loss anemia, insulin-dependent DM type II, essential hypertension, iron deficiency anemia, chronic constipation and chronic hypokalemia presented emergency department complaining of abdominal pain.  Found to have malignant ascites and family considering hospice.  Await plan.  Will get paracentesis 3/24 for symptom relief.     Assessment and Plan: Suspected malignant ascites in advanced stage IV hepatocellular carcinoma Hepatic cirrhosis with esophageal varices Portal hypertensive gastropathy - Patient presenting with acute on chronic abdominal pain without any nausea and vomiting.  Denies any fever, chill constipation or diarrhea.  Patient has a stage IV liver cell carcinoma currently ongoing radiation therapy.  Last radiation treatment 07/22/2023.  Reported since the treatment patient has significantly worsening abdominal pain. -CT abdomen pelvis showing extensive metastatic lesion and stage IV hepatocellular carcinoma.  New thrombosis of the perigastric and perisplenic varices.  Chronic portal vein thrombosis. -s/p thoracentesis-- start IV albumin  - Continue scheduled MS Contin 50 mg twice daily and continue Norco as needed for breakthrough pain, PRN IV meds -oncology: Due to his rapid cancer progression, and significantly decreased performance status, and multiple cancer related symptoms, I again recommend hospice home care.  I discussed the logistics, benefit and limitation of hospice care.  -palliative care following  -family waiting to  speak with [facility] to make decision -plan for therapeutic paracentesis 3/24   Chronic portal vein thrombosis New perigastric and and perisplenic varices -CT abdomen pelvis showing chronic portal vein thrombosis.  New perigastric and perisplenic varices thrombosis as well. -Given patient has history of chronic blood loss anemia in the context of protal hypertensive gastropathy and and esophageal varices which is limiting the use of anticoagulation in the past.  any IV anticoagulation as of now as high risk of bleeding/has anemia -unable to tolerate the  low-dose propranolol 20 mg twice daily due to hypotension   Anemia of chronic disease - Hemoglobin is around 10.8 to 8 weeks ago.  Baseline hemoglobin between 8-10.  Continue oral iron supplements. transfused PRBC and monitor  AKI-- >?hepatorenal syndrome -IVF -renal U/S did not show hydronephrosis -appears to be urinating well and Cr trending down  Decompensated hepatic cirrhosis Chronic thrombocytopenia -Same showing chronically elevated AST/ALT.  -trend   Insulin-dependent DM type II -Patient has poor oral intake of food due to abdominal pain.  Holding metformin and Jardiance to prevent hypoglycemia.'    GERD - Continue Protonix and sucralfate.   Chronic constipation -Continue MiraLAX and Senokot.  ?acute urinary retention vs ascites -appears to be urinating on own    DVT prophylaxis: SCDs Start: 07/28/23 2347 Place TED hose Start: 07/28/23 2347    Code Status: Do not attempt resuscitation (DNR) PRE-ARREST INTERVENTIONS DESIRED Family Communication: at bedside  Disposition Plan:  Level of care: Progressive Status is: Inpatient     Consultants:  Oncology IR- paracentesis Palliative care  Subjective: Sleepy this AM  Objective: Vitals:   08/02/23 0901 08/02/23 1213 08/02/23 2157 08/03/23 0341  BP: 120/64 136/71 136/72 134/73  Pulse: (!) 101 (!) 102 (!) 110 (!) 108  Resp:  15 14 16   Temp: 98.4 F  (36.9 C) 97.8  F (36.6 C) 97.9 F (36.6 C) 98.2 F (36.8 C)  TempSrc: Oral Oral Oral Oral  SpO2: 98% 100% 99% 100%  Weight:      Height:        Intake/Output Summary (Last 24 hours) at 08/03/2023 1036 Last data filed at 08/03/2023 6578 Gross per 24 hour  Intake 914.9 ml  Output 875 ml  Net 39.9 ml   Filed Weights   07/28/23 1802 07/29/23 0041  Weight: 58.6 kg 58.6 kg    Examination:   General: Appearance:    Thin male in no acute distress     Lungs:     respirations unlabored  Heart:    Tachycardic.  MS:   All extremities are intact.   Neurologic:   Awake, alert       Data Reviewed: I have personally reviewed following labs and imaging studies  CBC: Recent Labs  Lab 07/28/23 1925 07/29/23 0626 07/30/23 0607 07/31/23 0332 07/31/23 1544 08/01/23 0413 08/02/23 0342  WBC 5.7 6.9 7.6 6.0  --  5.6 5.2  NEUTROABS 4.6  --   --   --   --   --   --   HGB 8.8* 7.6* 8.3* 6.9* 8.1* 7.4* 7.7*  HCT 27.5* 24.2* 26.4* 21.7* 25.2* 23.5* 24.6*  MCV 96.2 96.4 96.7 97.3  --  95.9 98.0  PLT 172 149* 160 114*  --  104* 103*   Basic Metabolic Panel: Recent Labs  Lab 07/30/23 0607 07/31/23 0332 08/01/23 0413 08/02/23 0342 08/03/23 0330  NA 132* 132* 134* 135 138  K 4.2 4.0 4.1 4.4 4.4  CL 102 102 104 105 110  CO2 23 21* 19* 18* 16*  GLUCOSE 182* 162* 162* 178* 171*  BUN 26* 32* 44* 44* 36*  CREATININE 1.41* 1.76* 2.10* 1.84* 1.58*  CALCIUM 9.0 8.9 8.8* 9.0 8.5*   GFR: Estimated Creatinine Clearance: 36.6 mL/min (A) (by C-G formula based on SCr of 1.58 mg/dL (H)). Liver Function Tests: Recent Labs  Lab 07/28/23 2015 07/29/23 0626 07/31/23 0332  AST 53* 50* 68*  ALT 18 17 18   ALKPHOS 193* 170* 131*  BILITOT 1.1 0.8 1.4*  PROT 7.5 6.7 7.0  ALBUMIN 2.1* 2.0* 3.1*   No results for input(s): "LIPASE", "AMYLASE" in the last 168 hours. No results for input(s): "AMMONIA" in the last 168 hours. Coagulation Profile: No results for input(s): "INR", "PROTIME" in the  last 168 hours. Cardiac Enzymes: No results for input(s): "CKTOTAL", "CKMB", "CKMBINDEX", "TROPONINI" in the last 168 hours. BNP (last 3 results) No results for input(s): "PROBNP" in the last 8760 hours. HbA1C: No results for input(s): "HGBA1C" in the last 72 hours. CBG: Recent Labs  Lab 08/02/23 0733 08/02/23 1215 08/02/23 1625 08/02/23 2158 08/03/23 0728  GLUCAP 168* 160* 160* 157* 174*   Lipid Profile: No results for input(s): "CHOL", "HDL", "LDLCALC", "TRIG", "CHOLHDL", "LDLDIRECT" in the last 72 hours. Thyroid Function Tests: No results for input(s): "TSH", "T4TOTAL", "FREET4", "T3FREE", "THYROIDAB" in the last 72 hours. Anemia Panel: No results for input(s): "VITAMINB12", "FOLATE", "FERRITIN", "TIBC", "IRON", "RETICCTPCT" in the last 72 hours. Sepsis Labs: No results for input(s): "PROCALCITON", "LATICACIDVEN" in the last 168 hours.  Recent Results (from the past 240 hours)  Resp panel by RT-PCR (RSV, Flu A&B, Covid) Anterior Nasal Swab     Status: None   Collection Time: 07/28/23  6:41 PM   Specimen: Anterior Nasal Swab  Result Value Ref Range Status   SARS Coronavirus 2 by RT PCR NEGATIVE NEGATIVE  Final    Comment: (NOTE) SARS-CoV-2 target nucleic acids are NOT DETECTED.  The SARS-CoV-2 RNA is generally detectable in upper respiratory specimens during the acute phase of infection. The lowest concentration of SARS-CoV-2 viral copies this assay can detect is 138 copies/mL. A negative result does not preclude SARS-Cov-2 infection and should not be used as the sole basis for treatment or other patient management decisions. A negative result may occur with  improper specimen collection/handling, submission of specimen other than nasopharyngeal swab, presence of viral mutation(s) within the areas targeted by this assay, and inadequate number of viral copies(<138 copies/mL). A negative result must be combined with clinical observations, patient history, and  epidemiological information. The expected result is Negative.  Fact Sheet for Patients:  BloggerCourse.com  Fact Sheet for Healthcare Providers:  SeriousBroker.it  This test is no t yet approved or cleared by the [place] FDA and  has been authorized for detection and/or diagnosis of SARS-CoV-2 by FDA under an Emergency Use Authorization (EUA). This EUA will remain  in effect (meaning this test can be used) for the duration of the COVID-19 declaration under Section 564(b)(1) of the Act, 21 U.S.C.section 360bbb-3(b)(1), unless the authorization is terminated  or revoked sooner.       Influenza A by PCR NEGATIVE NEGATIVE Final   Influenza B by PCR NEGATIVE NEGATIVE Final    Comment: (NOTE) The Xpert Xpress SARS-CoV-2/FLU/RSV plus assay is intended as an aid in the diagnosis of influenza from Nasopharyngeal swab specimens and should not be used as a sole basis for treatment. Nasal washings and aspirates are unacceptable for Xpert Xpress SARS-CoV-2/FLU/RSV testing.  Fact Sheet for Patients: BloggerCourse.com  Fact Sheet for Healthcare Providers: SeriousBroker.it  This test is not yet approved or cleared by the [place] FDA and has been authorized for detection and/or diagnosis of SARS-CoV-2 by FDA under an Emergency Use Authorization (EUA). This EUA will remain in effect (meaning this test can be used) for the duration of the COVID-19 declaration under Section 564(b)(1) of the Act, 21 U.S.C. section 360bbb-3(b)(1), unless the authorization is terminated or revoked.     Resp Syncytial Virus by PCR NEGATIVE NEGATIVE Final    Comment: (NOTE) Fact Sheet for Patients: BloggerCourse.com  Fact Sheet for Healthcare Providers: SeriousBroker.it  This test is not yet approved or cleared by the [place] FDA and has been  authorized for detection and/or diagnosis of SARS-CoV-2 by FDA under an Emergency Use Authorization (EUA). This EUA will remain in effect (meaning this test can be used) for the duration of the COVID-19 declaration under Section 564(b)(1) of the Act, 21 U.S.C. section 360bbb-3(b)(1), unless the authorization is terminated or revoked.  Performed at [facility], [address] [place], [place] [address]   Body fluid culture w Gram Stain     Status: None   Collection Time: 07/30/23 12:24 PM   Specimen: PATH Cytology Peritoneal fluid  Result Value Ref Range Status   Specimen Description   Final    PERITONEAL Performed at [facility], [address] [place], [place] [address]    Special Requests   Final    NONE Performed at [facility], [address], [place], [place] [address]    Gram Stain   Final    FEW WBC PRESENT, PREDOMINANTLY PMN NO ORGANISMS SEEN    Culture   Final    NO GROWTH 3 DAYS Performed at [facility], [address], [place], [place]  [address]    Report Status 08/02/2023 FINAL  Final         Radiology Studies: No results found.       Scheduled Meds:  famotidine  10 mg Oral Daily   ferrous sulfate  325 mg Oral Q breakfast   insulin aspart  0-5 Units Subcutaneous QHS   insulin aspart  0-6 Units Subcutaneous TID WC   oxyCODONE  15 mg Oral Q12H   pantoprazole  40 mg Oral BID   polyethylene glycol  17 g Oral Daily   senna-docusate  1 tablet Oral BID   sodium chloride flush  3 mL Intravenous Q12H   sodium chloride flush  3 mL Intravenous Q12H   sucralfate  1 g Oral BID   tamsulosin  0.4 mg Oral QPC supper   Continuous Infusions:  albumin human        LOS: 6 days    Time spent: 45 minutes spent on chart review, discussion with nursing staff, consultants, updating family and interview/physical exam; more than 50% of that time was spent in counseling and/or coordination of  care.    Joseph Art, DO [facility] Available via Epic secure chat 7am-7pm After these hours, please refer to coverage provider listed on [email or web address] 08/03/2023, 10:36 AM

## 2023-08-03 NOTE — Progress Notes (Signed)
 Troy [name]   DOB:[birth year]   ZO#:[id number]   [id number]#:[id number]  Medical oncology follow-up note  Subjective: Patient had another paracentesis today, with 3 L fluid removed.  He is fatigued, able to keep some liquid and food down, but appetite is low.  He is able to go to bathroom by himself, but has not been out of bed much.  His wife is at bedside.   Objective:  Vitals:   08/03/23 1400 08/03/23 1423  BP: 120/69 130/71  Pulse:  (!) 108  Resp:  20  Temp:  98.2 F (36.8 C)  SpO2:  100%    Body mass index is 18.54 kg/m.  Intake/Output Summary (Last 24 hours) at 08/03/2023 1748 Last data filed at 08/03/2023 1500 Gross per 24 hour  Intake 665.95 ml  Output 600 ml  Net 65.95 ml     Sclerae unicteric  Oropharynx clear  No peripheral adenopathy  Lungs clear -- no rales or rhonchi  Heart regular rate and rhythm  Abdomen soft, not distended, mild diffuse tenderness.  MSK no focal spinal tenderness, no peripheral edema  Neuro nonfocal    CBG (last 3)  Recent Labs    08/03/23 0728 08/03/23 1122 08/03/23 1631  GLUCAP 174* 153* 153*     Labs:  Lab Results  Component Value Date   WBC 5.2 08/02/2023   HGB 7.7 (L) 08/02/2023   HCT 24.6 (L) 08/02/2023   MCV 98.0 08/02/2023   PLT 103 (L) 08/02/2023   NEUTROABS 4.6 07/28/2023    Urine Studies No results for input(s): "UHGB", "CRYS" in the last 72 hours.  Invalid input(s): "UACOL", "UAPR", "USPG", "UPH", "UTP", "UGL", "UKET", "UBIL", "UNIT", "UROB", "ULEU", "UEPI", "UWBC", "URBC", "UBAC", "CAST", "UCOM", "BILUA"  Basic Metabolic Panel: Recent Labs  Lab 07/30/23 0607 07/31/23 0332 08/01/23 0413 08/02/23 0342 08/03/23 0330  NA 132* 132* 134* 135 138  K 4.2 4.0 4.1 4.4 4.4  CL 102 102 104 105 110  CO2 23 21* 19* 18* 16*  GLUCOSE 182* 162* 162* 178* 171*  BUN 26* 32* 44* 44* 36*  CREATININE 1.41* 1.76* 2.10* 1.84* 1.58*  CALCIUM 9.0 8.9 8.8* 9.0 8.5*   GFR Estimated Creatinine Clearance: 36.6 mL/min (A) (by C-G  formula based on SCr of 1.58 mg/dL (H)). Liver Function Tests: Recent Labs  Lab 07/28/23 2015 07/29/23 0626 07/31/23 0332  AST 53* 50* 68*  ALT 18 17 18   ALKPHOS 193* 170* 131*  BILITOT 1.1 0.8 1.4*  PROT 7.5 6.7 7.0  ALBUMIN 2.1* 2.0* 3.1*   No results for input(s): "LIPASE", "AMYLASE" in the last 168 hours. No results for input(s): "AMMONIA" in the last 168 hours. Coagulation profile No results for input(s): "INR", "PROTIME" in the last 168 hours.  CBC: Recent Labs  Lab 07/28/23 1925 07/29/23 0626 07/30/23 0607 07/31/23 0332 07/31/23 1544 08/01/23 0413 08/02/23 0342  WBC 5.7 6.9 7.6 6.0  --  5.6 5.2  NEUTROABS 4.6  --   --   --   --   --   --   HGB 8.8* 7.6* 8.3* 6.9* 8.1* 7.4* 7.7*  HCT 27.5* 24.2* 26.4* 21.7* 25.2* 23.5* 24.6*  MCV 96.2 96.4 96.7 97.3  --  95.9 98.0  PLT 172 149* 160 114*  --  104* 103*   Cardiac Enzymes: No results for input(s): "CKTOTAL", "CKMB", "CKMBINDEX", "TROPONINI" in the last 168 hours. BNP: Invalid input(s): "POCBNP" CBG: Recent Labs  Lab 08/02/23 1625 08/02/23 2158 08/03/23 0728 08/03/23 1122 08/03/23 1631  GLUCAP 160* 157*  174* 153* 153*   D-Dimer No results for input(s): "DDIMER" in the last 72 hours. Hgb A1c No results for input(s): "HGBA1C" in the last 72 hours. Lipid Profile No results for input(s): "CHOL", "HDL", "LDLCALC", "TRIG", "CHOLHDL", "LDLDIRECT" in the last 72 hours. Thyroid function studies No results for input(s): "TSH", "T4TOTAL", "T3FREE", "THYROIDAB" in the last 72 hours.  Invalid input(s): "FREET3" Anemia work up No results for input(s): "VITAMINB12", "FOLATE", "FERRITIN", "TIBC", "IRON", "RETICCTPCT" in the last 72 hours. Microbiology Recent Results (from the past 240 hours)  Resp panel by RT-PCR (RSV, Flu A&B, Covid) Anterior Nasal Swab     Status: None   Collection Time: 07/28/23  6:41 PM   Specimen: Anterior Nasal Swab  Result Value Ref Range Status   SARS Coronavirus 2 by RT PCR NEGATIVE  NEGATIVE Final    Comment: (NOTE) SARS-CoV-2 target nucleic acids are NOT DETECTED.  The SARS-CoV-2 RNA is generally detectable in upper respiratory specimens during the acute phase of infection. The lowest concentration of SARS-CoV-2 viral copies this assay can detect is 138 copies/mL. A negative result does not preclude SARS-Cov-2 infection and should not be used as the sole basis for treatment or other patient management decisions. A negative result may occur with  improper specimen collection/handling, submission of specimen other than nasopharyngeal swab, presence of viral mutation(s) within the areas targeted by this assay, and inadequate number of viral copies(<138 copies/mL). A negative result must be combined with clinical observations, patient history, and epidemiological information. The expected result is Negative.  Fact Sheet for Patients:  BloggerCourse.com  Fact Sheet for Healthcare Providers:  SeriousBroker.it  This test is no t yet approved or cleared by the [place] FDA and  has been authorized for detection and/or diagnosis of SARS-CoV-2 by FDA under an Emergency Use Authorization (EUA). This EUA will remain  in effect (meaning this test can be used) for the duration of the COVID-19 declaration under Section 564(b)(1) of the Act, 21 U.S.C.section 360bbb-3(b)(1), unless the authorization is terminated  or revoked sooner.       Influenza A by PCR NEGATIVE NEGATIVE Final   Influenza B by PCR NEGATIVE NEGATIVE Final    Comment: (NOTE) The Xpert Xpress SARS-CoV-2/FLU/RSV plus assay is intended as an aid in the diagnosis of influenza from Nasopharyngeal swab specimens and should not be used as a sole basis for treatment. Nasal washings and aspirates are unacceptable for Xpert Xpress SARS-CoV-2/FLU/RSV testing.  Fact Sheet for Patients: BloggerCourse.com  Fact Sheet for Healthcare  Providers: SeriousBroker.it  This test is not yet approved or cleared by the [place] FDA and has been authorized for detection and/or diagnosis of SARS-CoV-2 by FDA under an Emergency Use Authorization (EUA). This EUA will remain in effect (meaning this test can be used) for the duration of the COVID-19 declaration under Section 564(b)(1) of the Act, 21 U.S.C. section 360bbb-3(b)(1), unless the authorization is terminated or revoked.     Resp Syncytial Virus by PCR NEGATIVE NEGATIVE Final    Comment: (NOTE) Fact Sheet for Patients: BloggerCourse.com  Fact Sheet for Healthcare Providers: SeriousBroker.it  This test is not yet approved or cleared by the [place] FDA and has been authorized for detection and/or diagnosis of SARS-CoV-2 by FDA under an Emergency Use Authorization (EUA). This EUA will remain in effect (meaning this test can be used) for the duration of the COVID-19 declaration under Section 564(b)(1) of the Act, 21 U.S.C. section 360bbb-3(b)(1), unless the authorization is terminated or revoked.  Performed at  [facility], [address], [place], [place] [address]   Body fluid culture w Gram Stain     Status: None   Collection Time: 07/30/23 12:24 PM   Specimen: PATH Cytology Peritoneal fluid  Result Value Ref Range Status   Specimen Description   Final    PERITONEAL Performed at [facility], [address], [place], [place] [address]    Special Requests   Final    NONE Performed at [facility], [address], [place], [place] [address]    Gram Stain   Final    FEW WBC PRESENT, PREDOMINANTLY PMN NO ORGANISMS SEEN    Culture   Final    NO GROWTH 3 DAYS Performed at [facility], [address], [place], [place] [address]    Report Status 08/02/2023 FINAL  Final      Studies:  US Paracentesis Result  Date: 08/03/2023 INDICATION: Patient with history of stage IV hepatocellular carcinoma, cirrhosis, chronic portal vein thrombosis, anemia, acute kidney injury, recurrent ascites. Request received for diagnostic and therapeutic paracentesis up to 6 liters. EXAM: ULTRASOUND GUIDED DIAGNOSTIC AND THERAPEUTIC PARACENTESIS MEDICATIONS: 8 mL 1% lidocaine COMPLICATIONS: None immediate. PROCEDURE: Informed written consent was obtained from the patient after a discussion of the risks, benefits and alternatives to treatment. A timeout was performed prior to the initiation of the procedure. Initial ultrasound scanning demonstrates a moderate amount of ascites within the right mid to lower abdominal quadrant. The right mid to lower abdomen was prepped and draped in the usual sterile fashion. 1% lidocaine was used for local anesthesia. Following this, a 19 gauge, 7-cm, Yueh catheter was introduced. An ultrasound image was saved for documentation purposes. The paracentesis was performed. The catheter was removed and a dressing was applied. The patient tolerated the procedure well without immediate post procedural complication. FINDINGS: A total of approximately 3.3 liters of bloody fluid was removed. Samples were sent to the laboratory as requested by the clinical team. IMPRESSION: Successful ultrasound-guided diagnostic and therapeutic paracentesis yielding 3.3 liters of peritoneal fluid. Performed by: Artemio Aly Electronically Signed   By: Corlis Leak M.D.   On: 08/03/2023 16:17    Assessment: 68 y.o. male  New ascites, likely malignant, status post paracentesisX2 since admission  Metastatic hepatocellular carcinoma with progression, on palliative care Failure to thrive due to advanced cancer Cancer-related abdominal and back pain Chronic portal vein thrombosis AKI Type 2 diabetes    Plan:  -lab reviewed  -Appreciate palliative care team assistance, patient and his wife has agreed to go home with hospice  home care.  Likely go home tomorrow. -I will be happy to serve as his attending when he is on hospice care. -I provided my strong supportive to their decision, and they know to call me if anything I can help after discharge.  [name], MD 08/03/2023

## 2023-08-03 NOTE — Plan of Care (Signed)

## 2023-08-03 NOTE — Procedures (Signed)
 Ultrasound-guided diagnostic and therapeutic paracentesis performed yielding  3.3 liters of bloody fluid. No immediate complications. EBL < 2 cc.

## 2023-08-04 DIAGNOSIS — E860 Dehydration: Secondary | ICD-10-CM | POA: Diagnosis not present

## 2023-08-04 DIAGNOSIS — C220 Liver cell carcinoma: Secondary | ICD-10-CM | POA: Diagnosis not present

## 2023-08-04 DIAGNOSIS — R627 Adult failure to thrive: Secondary | ICD-10-CM | POA: Diagnosis not present

## 2023-08-04 LAB — GLUCOSE, CAPILLARY
Glucose-Capillary: 230 mg/dL — ABNORMAL HIGH (ref 70–99)
Glucose-Capillary: 203 mg/dL — ABNORMAL HIGH (ref 70–99)
Glucose-Capillary: 208 mg/dL — ABNORMAL HIGH (ref 70–99)
Glucose-Capillary: 219 mg/dL — ABNORMAL HIGH (ref 70–99)

## 2023-08-04 LAB — BASIC METABOLIC PANEL
Anion gap: 13 (ref 5–15)
BUN: 31 mg/dL — ABNORMAL HIGH (ref 8–23)
CO2: 16 mmol/L — ABNORMAL LOW (ref 22–32)
Calcium: 8.9 mg/dL (ref 8.9–10.3)
Chloride: 107 mmol/L (ref 98–111)
Creatinine, Ser: 1.5 mg/dL — ABNORMAL HIGH (ref 0.61–1.24)
GFR, Estimated: 50 mL/min — ABNORMAL LOW (ref >60)
Glucose, Bld: 234 mg/dL — ABNORMAL HIGH (ref 70–99)
Potassium: 4.2 mmol/L (ref 3.5–5.1)
Sodium: 136 mmol/L (ref 135–145)

## 2023-08-04 MED ORDER — SODIUM CHLORIDE 0.9 % IV SOLN: 12.5000 mg | Freq: Four times a day (QID) | INTRAVENOUS | Status: DC | PRN

## 2023-08-04 MED ORDER — PROCHLORPERAZINE EDISYLATE 10 MG/2ML IJ SOLN
10.0000 mg | Freq: Four times a day (QID) | INTRAMUSCULAR | Status: DC | PRN
  Administered 2023-08-04: 10 mg via INTRAVENOUS
  Filled 2023-08-04: qty 2

## 2023-08-04 NOTE — TOC Progression Note (Signed)
 Transition of Care [facility]) - Progression Note   Patient Details  Name: Troy [name] MRN: [id number] Date of Birth: [birth year]  [facility] of [facility]) CM/SW Contact  Ewing Schlein, LCSW Phone Number: 08/04/2023, 9:54 AM  Clinical Narrative: CSW spoke with Cheri with [facility] and confirmed wife has accepted hospice at home at discharge. Patient will need a bedside commode and transport chair, which hospice will have delivered to the home. Patient is expected to be medically ready for discharge tomorrow.  Expected Discharge Plan: Home w Hospice Care Barriers to Discharge: Continued Medical Work up  Expected Discharge Plan and Services In-house Referral: Clinical Social Work Post Acute Care Choice: Hospice Living arrangements for the past 2 months: Single Family Home           DME Arranged:  ([facility] is setting up a BSC and transport chair.) DME Agency: Other - Comment ([facility])  Social Determinants of Health (SDOH) Interventions SDOH Screenings   Food Insecurity: No Food Insecurity (07/29/2023)  Housing: Low Risk  (07/29/2023)  Transportation Needs: No Transportation Needs (07/29/2023)  Utilities: Not At Risk (07/29/2023)  Social Connections: Socially Integrated (07/29/2023)  Tobacco Use: Medium Risk (07/29/2023)   Readmission Risk Interventions    07/31/2023    9:36 AM  Readmission Risk Prevention Plan  Transportation Screening Complete  Medication Review (RN Care Manager) Complete  HRI or Home Care Consult Complete  SW Recovery Care/Counseling Consult Complete  Palliative Care Screening Complete  Skilled Nursing Facility Not Applicable

## 2023-08-04 NOTE — Progress Notes (Signed)
 Chaplain met with Carnel and his wife

## 2023-08-04 NOTE — Progress Notes (Signed)
   This pt was referred to hospice services at home. I was able to meet with the pt and his wife yesterday to discuss hospice at home. She needed more time to process what she was being told and was in agreement with me following up today to see if they were able to decided if hospice care at home would be a good fit.   I did follow up today and the wife does report she would like to go home with hospice support. I have ordered a BSC and transport chair for delivery to the home.   The pt has been approved for hospice care and we will plan to support and enroll into hospice program after d/c.   Norm Parcel RN [phone]

## 2023-08-04 NOTE — Progress Notes (Signed)
 PROGRESS NOTE    Troy [name]  UJW:[id number] DOB: 10-27-1955 DOA: 07/28/2023 PCP: [name], MD    Brief Narrative:  Troy [name] is a 68 y.o. male with medical history significant of stage IV hepatocellular carcinoma status post radiation and TREC 2023 currently on chemotherapy, hepatic cirrhosis with esophageal varices, chronic thrombocytopenia, chronic portal vein thrombosis, history of recurrent GI bleed due to hepatic gastropathy, chronic blood loss anemia, insulin-dependent DM type II, essential hypertension, iron deficiency anemia, chronic constipation and chronic hypokalemia presented emergency department complaining of abdominal pain.  Found to have malignant ascites.  S/p paracentesis 3/24 for symptom relief.  Wife agreeable to go home with hospice and will plan for d/c on 3/26-- changing nausea medications 3/25 as zofran does not work for him and monitoring for response.     Assessment and Plan: Suspected malignant ascites in advanced stage IV hepatocellular carcinoma Hepatic cirrhosis with esophageal varices Portal hypertensive gastropathy - Patient presenting with acute on chronic abdominal pain without any nausea and vomiting.  Denies any fever, chill constipation or diarrhea.  Patient has a stage IV liver cell carcinoma currently ongoing radiation therapy.  Last radiation treatment 07/22/2023.  Reported since the treatment patient has significantly worsening abdominal pain. -CT abdomen pelvis showing extensive metastatic lesion and stage IV hepatocellular carcinoma.  New thrombosis of the perigastric and perisplenic varices.  Chronic portal vein thrombosis. -s/p thoracentesis-- start IV albumin  - Continue scheduled MS Contin 50 mg twice daily and continue Norco as needed for breakthrough pain -oncology: Due to his rapid cancer progression, and significantly decreased performance status, and multiple cancer related symptoms,  recommend hospice home care.   -s/p therapeutic  paracentesis 3/24   Chronic portal vein thrombosis New perigastric and and perisplenic varices -CT abdomen pelvis showing chronic portal vein thrombosis.  New perigastric and perisplenic varices thrombosis as well. -Given patient has history of chronic blood loss anemia in the context of protal hypertensive gastropathy and and esophageal varices which is limiting the use of anticoagulation in the past.  any IV anticoagulation as of now as high risk of bleeding/has anemia -unable to tolerate the  low-dose propranolol 20 mg twice daily due to hypotension   Anemia of chronic disease - Hemoglobin is around 10.8 to 8 weeks ago.  Baseline hemoglobin between 8-10.  Continue oral iron supplements. -transfused 1 unit PRBC and monitor  AKI-- >?hepatorenal syndrome -IVF -renal U/S did not show hydronephrosis -appears to be urinating well and Cr trending down  Decompensated hepatic cirrhosis Chronic thrombocytopenia -Same showing chronically elevated AST/ALT.  -trend   Insulin-dependent DM type II -Patient has poor oral intake of food due to abdominal pain.  Holding metformin and Jardiance to prevent hypoglycemia.'   GERD - Continue Protonix and sucralfate.   Chronic constipation -Continue MiraLAX and Senokot.  ?acute urinary retention vs ascites on bladder scan -appears to be urinating on own-- resolved  Wife working to arrange prive caregivers today in addition to the care givers though the [place]-- plan for d/c home with hospice in the AM   DVT prophylaxis: SCDs Start: 07/28/23 2347 Place TED hose Start: 07/28/23 2347    Code Status: Do not attempt resuscitation (DNR) PRE-ARREST INTERVENTIONS DESIRED Family Communication: at bedside  Disposition Plan:  Level of care: Palliative Care Status is: Inpatient     Consultants:  Oncology IR- paracentesis Palliative care  Subjective: Zofran not working for nausea. He took compazine and was able to take in some water and broth    Objective:  Vitals:   08/03/23 1350 08/03/23 1400 08/03/23 1423 08/03/23 2124  BP: (!) 140/65 120/69 130/71 127/68  Pulse:   (!) 108 (!) 110  Resp:   20   Temp:   98.2 F (36.8 C) 97.6 F (36.4 C)  TempSrc:   Oral Oral  SpO2:   100% 100%  Weight:      Height:        Intake/Output Summary (Last 24 hours) at 08/04/2023 1100 Last data filed at 08/04/2023 0000 Gross per 24 hour  Intake 176 ml  Output 500 ml  Net -324 ml   Filed Weights   07/28/23 1802 07/29/23 0041  Weight: 58.6 kg 58.6 kg    Examination:   General: Appearance:    Thin male in no acute distress     Lungs:     respirations unlabored  Heart:    Tachycardic.  MS:   All extremities are intact.   Neurologic:   Awake, alert       Data Reviewed: I have personally reviewed following labs and imaging studies  CBC: Recent Labs  Lab 07/28/23 1925 07/29/23 0626 07/30/23 0607 07/31/23 0332 07/31/23 1544 08/01/23 0413 08/02/23 0342  WBC 5.7 6.9 7.6 6.0  --  5.6 5.2  NEUTROABS 4.6  --   --   --   --   --   --   HGB 8.8* 7.6* 8.3* 6.9* 8.1* 7.4* 7.7*  HCT 27.5* 24.2* 26.4* 21.7* 25.2* 23.5* 24.6*  MCV 96.2 96.4 96.7 97.3  --  95.9 98.0  PLT 172 149* 160 114*  --  104* 103*   Basic Metabolic Panel: Recent Labs  Lab 07/31/23 0332 08/01/23 0413 08/02/23 0342 08/03/23 0330 08/04/23 0815  NA 132* 134* 135 138 136  K 4.0 4.1 4.4 4.4 4.2  CL 102 104 105 110 107  CO2 21* 19* 18* 16* 16*  GLUCOSE 162* 162* 178* 171* 234*  BUN 32* 44* 44* 36* 31*  CREATININE 1.76* 2.10* 1.84* 1.58* 1.50*  CALCIUM 8.9 8.8* 9.0 8.5* 8.9   GFR: Estimated Creatinine Clearance: 38.5 mL/min (A) (by C-G formula based on SCr of 1.5 mg/dL (H)). Liver Function Tests: Recent Labs  Lab 07/28/23 2015 07/29/23 0626 07/31/23 0332  AST 53* 50* 68*  ALT 18 17 18   ALKPHOS 193* 170* 131*  BILITOT 1.1 0.8 1.4*  PROT 7.5 6.7 7.0  ALBUMIN 2.1* 2.0* 3.1*   No results for input(s): "LIPASE", "AMYLASE" in the last 168 hours. No  results for input(s): "AMMONIA" in the last 168 hours. Coagulation Profile: No results for input(s): "INR", "PROTIME" in the last 168 hours. Cardiac Enzymes: No results for input(s): "CKTOTAL", "CKMB", "CKMBINDEX", "TROPONINI" in the last 168 hours. BNP (last 3 results) No results for input(s): "PROBNP" in the last 8760 hours. HbA1C: No results for input(s): "HGBA1C" in the last 72 hours. CBG: Recent Labs  Lab 08/03/23 0728 08/03/23 1122 08/03/23 1631 08/03/23 2212 08/04/23 0752  GLUCAP 174* 153* 153* 169* 230*   Lipid Profile: No results for input(s): "CHOL", "HDL", "LDLCALC", "TRIG", "CHOLHDL", "LDLDIRECT" in the last 72 hours. Thyroid Function Tests: No results for input(s): "TSH", "T4TOTAL", "FREET4", "T3FREE", "THYROIDAB" in the last 72 hours. Anemia Panel: No results for input(s): "VITAMINB12", "FOLATE", "FERRITIN", "TIBC", "IRON", "RETICCTPCT" in the last 72 hours. Sepsis Labs: No results for input(s): "PROCALCITON", "LATICACIDVEN" in the last 168 hours.  Recent Results (from the past 240 hours)  Resp panel by RT-PCR (RSV, Flu A&B, Covid) Anterior Nasal Swab  Status: None   Collection Time: 07/28/23  6:41 PM   Specimen: Anterior Nasal Swab  Result Value Ref Range Status   SARS Coronavirus 2 by RT PCR NEGATIVE NEGATIVE Final    Comment: (NOTE) SARS-CoV-2 target nucleic acids are NOT DETECTED.  The SARS-CoV-2 RNA is generally detectable in upper respiratory specimens during the acute phase of infection. The lowest concentration of SARS-CoV-2 viral copies this assay can detect is 138 copies/mL. A negative result does not preclude SARS-Cov-2 infection and should not be used as the sole basis for treatment or other patient management decisions. A negative result may occur with  improper specimen collection/handling, submission of specimen other than nasopharyngeal swab, presence of viral mutation(s) within the areas targeted by this assay, and inadequate number of  viral copies(<138 copies/mL). A negative result must be combined with clinical observations, patient history, and epidemiological information. The expected result is Negative.  Fact Sheet for Patients:  BloggerCourse.com  Fact Sheet for Healthcare Providers:  SeriousBroker.it  This test is no t yet approved or cleared by the [place] FDA and  has been authorized for detection and/or diagnosis of SARS-CoV-2 by FDA under an Emergency Use Authorization (EUA). This EUA will remain  in effect (meaning this test can be used) for the duration of the COVID-19 declaration under Section 564(b)(1) of the Act, 21 U.S.C.section 360bbb-3(b)(1), unless the authorization is terminated  or revoked sooner.       Influenza A by PCR NEGATIVE NEGATIVE Final   Influenza B by PCR NEGATIVE NEGATIVE Final    Comment: (NOTE) The Xpert Xpress SARS-CoV-2/FLU/RSV plus assay is intended as an aid in the diagnosis of influenza from Nasopharyngeal swab specimens and should not be used as a sole basis for treatment. Nasal washings and aspirates are unacceptable for Xpert Xpress SARS-CoV-2/FLU/RSV testing.  Fact Sheet for Patients: BloggerCourse.com  Fact Sheet for Healthcare Providers: SeriousBroker.it  This test is not yet approved or cleared by the [place] FDA and has been authorized for detection and/or diagnosis of SARS-CoV-2 by FDA under an Emergency Use Authorization (EUA). This EUA will remain in effect (meaning this test can be used) for the duration of the COVID-19 declaration under Section 564(b)(1) of the Act, 21 U.S.C. section 360bbb-3(b)(1), unless the authorization is terminated or revoked.     Resp Syncytial Virus by PCR NEGATIVE NEGATIVE Final    Comment: (NOTE) Fact Sheet for Patients: BloggerCourse.com  Fact Sheet for Healthcare  Providers: SeriousBroker.it  This test is not yet approved or cleared by the [place] FDA and has been authorized for detection and/or diagnosis of SARS-CoV-2 by FDA under an Emergency Use Authorization (EUA). This EUA will remain in effect (meaning this test can be used) for the duration of the COVID-19 declaration under Section 564(b)(1) of the Act, 21 U.S.C. section 360bbb-3(b)(1), unless the authorization is terminated or revoked.  Performed at [facility], [address] [place], [place] [address]   Body fluid culture w Gram Stain     Status: None   Collection Time: 07/30/23 12:24 PM   Specimen: PATH Cytology Peritoneal fluid  Result Value Ref Range Status   Specimen Description   Final    PERITONEAL Performed at [facility], [address], [place], [place] [address]    Special Requests   Final    NONE Performed at [facility], [address], [place], [place] [address]    Gram Stain   Final    FEW WBC  PRESENT, PREDOMINANTLY PMN NO ORGANISMS SEEN    Culture   Final    NO GROWTH 3 DAYS Performed at [facility], [address] [place], [place] [address]    Report Status 08/02/2023 FINAL  Final         Radiology Studies: US Paracentesis Result Date: 08/03/2023 INDICATION: Patient with history of stage IV hepatocellular carcinoma, cirrhosis, chronic portal vein thrombosis, anemia, acute kidney injury, recurrent ascites. Request received for diagnostic and therapeutic paracentesis up to 6 liters. EXAM: ULTRASOUND GUIDED DIAGNOSTIC AND THERAPEUTIC PARACENTESIS MEDICATIONS: 8 mL 1% lidocaine COMPLICATIONS: None immediate. PROCEDURE: Informed written consent was obtained from the patient after a discussion of the risks, benefits and alternatives to treatment. A timeout was performed prior to the initiation of the procedure. Initial ultrasound scanning demonstrates a moderate  amount of ascites within the right mid to lower abdominal quadrant. The right mid to lower abdomen was prepped and draped in the usual sterile fashion. 1% lidocaine was used for local anesthesia. Following this, a 19 gauge, 7-cm, Yueh catheter was introduced. An ultrasound image was saved for documentation purposes. The paracentesis was performed. The catheter was removed and a dressing was applied. The patient tolerated the procedure well without immediate post procedural complication. FINDINGS: A total of approximately 3.3 liters of bloody fluid was removed. Samples were sent to the laboratory as requested by the clinical team. IMPRESSION: Successful ultrasound-guided diagnostic and therapeutic paracentesis yielding 3.3 liters of peritoneal fluid. Performed by: Artemio Aly Electronically Signed   By: Corlis Leak M.D.   On: 08/03/2023 16:17         Scheduled Meds:  famotidine  10 mg Oral Daily   ferrous sulfate  325 mg Oral Q breakfast   insulin aspart  0-5 Units Subcutaneous QHS   insulin aspart  0-6 Units Subcutaneous TID WC   oxyCODONE  15 mg Oral Q12H   pantoprazole  40 mg Oral BID   polyethylene glycol  17 g Oral Daily   senna-docusate  1 tablet Oral BID   sodium chloride flush  3 mL Intravenous Q12H   sodium chloride flush  3 mL Intravenous Q12H   sucralfate  1 g Oral BID   tamsulosin  0.4 mg Oral QPC supper   Continuous Infusions:  promethazine (PHENERGAN) injection (IM or IVPB)        LOS: 7 days    Time spent: 45 minutes spent on chart review, discussion with nursing staff, consultants, updating family and interview/physical exam; more than 50% of that time was spent in counseling and/or coordination of care.    Joseph Art, DO [facility] Available via Epic secure chat 7am-7pm After these hours, please refer to coverage provider listed on [email or web address] 08/04/2023, 11:00 AM

## 2023-08-05 ENCOUNTER — Encounter: Payer: Self-pay | Admitting: Hematology

## 2023-08-05 ENCOUNTER — Other Ambulatory Visit (HOSPITAL_COMMUNITY): Payer: Self-pay

## 2023-08-05 DIAGNOSIS — R1010 Upper abdominal pain, unspecified: Secondary | ICD-10-CM

## 2023-08-05 LAB — GLUCOSE, CAPILLARY
Glucose-Capillary: 224 mg/dL — ABNORMAL HIGH (ref 70–99)
Glucose-Capillary: 215 mg/dL — ABNORMAL HIGH (ref 70–99)

## 2023-08-05 LAB — CYTOLOGY - NON PAP

## 2023-08-05 MED ORDER — POLYETHYLENE GLYCOL 3350 17 GM/SCOOP PO POWD
17.0000 g | Freq: Every day | ORAL | 0 refills | Status: DC
Start: 2023-08-05 — End: 2023-08-05
  Filled 2023-08-05: qty 238, 14d supply, fill #0

## 2023-08-05 MED ORDER — FAMOTIDINE 20 MG PO TABS: 20.0000 mg | ORAL_TABLET | Freq: Every day | ORAL | 0 refills | Status: DC

## 2023-08-05 MED ORDER — OXYCODONE HCL ER 15 MG PO T12A: 15.0000 mg | EXTENDED_RELEASE_TABLET | Freq: Two times a day (BID) | ORAL | 0 refills | Status: DC

## 2023-08-05 MED ORDER — PANTOPRAZOLE SODIUM 40 MG PO TBEC
40.0000 mg | DELAYED_RELEASE_TABLET | Freq: Two times a day (BID) | ORAL | 0 refills | Status: DC
  Filled 2023-08-05: qty 60, 30d supply, fill #0

## 2023-08-05 MED ORDER — SENNOSIDES-DOCUSATE SODIUM 8.6-50 MG PO TABS: 1.0000 | ORAL_TABLET | Freq: Two times a day (BID) | ORAL | 0 refills | Status: DC

## 2023-08-05 MED ORDER — OXYCODONE HCL ER 15 MG PO T12A
15.0000 mg | EXTENDED_RELEASE_TABLET | Freq: Two times a day (BID) | ORAL | 0 refills | Status: DC
Start: 2023-08-05 — End: 2023-08-05
  Filled 2023-08-05: qty 14, 7d supply, fill #0

## 2023-08-05 MED ORDER — PANTOPRAZOLE SODIUM 40 MG PO TBEC: 40.0000 mg | DELAYED_RELEASE_TABLET | Freq: Two times a day (BID) | ORAL | 0 refills | Status: DC

## 2023-08-05 MED ORDER — ONDANSETRON 4 MG PO TBDP: 4.0000 mg | ORAL_TABLET | Freq: Three times a day (TID) | ORAL | 0 refills | Status: DC | PRN

## 2023-08-05 MED ORDER — PROCHLORPERAZINE MALEATE 10 MG PO TABS: 10.0000 mg | ORAL_TABLET | Freq: Four times a day (QID) | ORAL | 0 refills | Status: DC | PRN

## 2023-08-05 MED ORDER — SUCRALFATE 1 G PO TABS
1.0000 g | ORAL_TABLET | Freq: Two times a day (BID) | ORAL | 0 refills | Status: DC
  Filled 2023-08-05: qty 20, 10d supply, fill #0

## 2023-08-05 MED ORDER — HYDROCODONE-ACETAMINOPHEN 5-325 MG PO TABS
1.0000 | ORAL_TABLET | Freq: Four times a day (QID) | ORAL | 0 refills | Status: DC | PRN
  Filled 2023-08-05: qty 30, 8d supply, fill #0

## 2023-08-05 MED ORDER — FAMOTIDINE 20 MG PO TABS
10.0000 mg | ORAL_TABLET | Freq: Every day | ORAL | 0 refills | Status: DC
  Filled 2023-08-05: qty 15, 30d supply, fill #0

## 2023-08-05 MED ORDER — POLYETHYLENE GLYCOL 3350 17 GM/SCOOP PO POWD: 17.0000 g | Freq: Every day | ORAL | 0 refills | Status: DC

## 2023-08-05 MED ORDER — SUCRALFATE 1 G PO TABS: 1.0000 g | ORAL_TABLET | Freq: Two times a day (BID) | ORAL | 0 refills | Status: DC

## 2023-08-05 MED ORDER — HYDROCODONE-ACETAMINOPHEN 5-325 MG PO TABS
1.0000 | ORAL_TABLET | Freq: Four times a day (QID) | ORAL | 0 refills | Status: DC | PRN
Start: 2023-08-05 — End: 2023-08-22

## 2023-08-05 MED ORDER — ONDANSETRON 4 MG PO TBDP
4.0000 mg | ORAL_TABLET | Freq: Three times a day (TID) | ORAL | 0 refills | Status: DC | PRN
  Filled 2023-08-05: qty 30, 10d supply, fill #0

## 2023-08-05 MED ORDER — PROMETHAZINE HCL 25 MG PO TABS: 25.0000 mg | ORAL_TABLET | Freq: Four times a day (QID) | ORAL | 0 refills | Status: DC | PRN

## 2023-08-05 MED ORDER — SENNOSIDES-DOCUSATE SODIUM 8.6-50 MG PO TABS
1.0000 | ORAL_TABLET | Freq: Two times a day (BID) | ORAL | 0 refills | Status: DC
  Filled 2023-08-05: qty 60, 30d supply, fill #0

## 2023-08-05 NOTE — Plan of Care (Signed)
 AVS completed and reviewed with pt and family Next doses documented in EPIC Pt  d/c'd with all belongings  Problem: Education: Goal: Ability to describe self-care measures that may prevent or decrease complications (Diabetes Survival Skills Education) will improve Outcome: Completed/Met   Problem: Health Behavior/Discharge Planning: Goal: Ability to identify and utilize available resources and services will improve Outcome: Completed/Met

## 2023-08-05 NOTE — TOC Transition Note (Signed)
 Transition of Care [facility]) - Discharge Note  Patient Details  Name: Troy [name] MRN: [id number] Date of Birth: Apr 05, 1956  Transition of Care [facility]) CM/SW Contact:  Ewing Schlein, LCSW Phone Number: 08/05/2023, 11:18 AM  Clinical Narrative: Patient is medically stable to discharge home with hospice services. CSW updated Cheri with [facility]. Family to transport patient home. TOC signing off.  Final next level of care: Home w Hospice Care Barriers to Discharge: Barriers Resolved  Patient Goals and CMS Choice Patient states their goals for this hospitalization and ongoing recovery are:: Go home with hospice CMS Medicare.gov Compare Post Acute Care list provided to:: Patient Represenative (must comment) Choice offered to / list presented to : Spouse  Discharge Plan and Services Additional resources added to the After Visit Summary for   In-house Referral: Clinical Social Work Post Acute Care Choice: Hospice          DME Arranged:  ([facility] is setting up a BSC and transport chair.) DME Agency: Other - Comment ([facility])  Social Drivers of Health (SDOH) Interventions SDOH Screenings   Food Insecurity: No Food Insecurity (07/29/2023)  Housing: Low Risk  (07/29/2023)  Transportation Needs: No Transportation Needs (07/29/2023)  Utilities: Not At Risk (07/29/2023)  Social Connections: Socially Integrated (07/29/2023)  Tobacco Use: Medium Risk (07/29/2023)   Readmission Risk Interventions    07/31/2023    9:36 AM  Readmission Risk Prevention Plan  Transportation Screening Complete  Medication Review (RN Care Manager) Complete  HRI or Home Care Consult Complete  SW Recovery Care/Counseling Consult Complete  Palliative Care Screening Complete  Skilled Nursing Facility Not Applicable

## 2023-08-05 NOTE — Progress Notes (Signed)
 PROGRESS NOTE    Troy [name]  ZOX:[id number] DOB: Nov 12, 1955 DOA: 07/28/2023 PCP: [name], MD    Brief Narrative:   68 y.o. male with medical history significant of stage IV hepatocellular carcinoma status post radiation and TREC 2023 currently on chemotherapy, hepatic cirrhosis with esophageal varices, chronic thrombocytopenia, chronic portal vein thrombosis, history of recurrent GI bleed due to hepatic gastropathy, chronic blood loss anemia, insulin-dependent DM type II, essential hypertension, iron deficiency anemia, chronic constipation and chronic hypokalemia presented emergency department complaining of abdominal pain.  Found to have malignant ascites secondary to stage IV hepatocellular carcinoma.  S/p paracentesis 3/24 for symptom relief.  Wife agreeable to go home with hospice and will plan for d/c on 3/26-- changing nausea medications 3/25 as zofran does not work for him and monitoring for response.  Eventually after extensive conversation by previous provider with the family, patient was transition to comfort care.  Will discharge him home today  Assessment & Plan:  Principal Problem:   Abdominal pain Active Problems:   Failure to thrive in adult   Portal vein thrombosis with liver mass concerning for malignancy    Portal hypertensive gastropathy (HCC)   Secondary esophageal varices without bleeding (HCC)   Hepatic cirrhosis due to chronic hepatitis C infection (HCC)   Insulin dependent type 2 diabetes mellitus (HCC)   Essential hypertension   Hepatocellular carcinoma (HCC)   Cancer related pain   Chronic idiopathic thrombocytopenia (HCC)   Chronic anemia   Encounter for palliative care    Suspected malignant ascites in advanced stage IV hepatocellular carcinoma Hepatic cirrhosis with esophageal varices Portal hypertensive gastropathy -Unfortunately patient has stage IV hepatocellular carcinoma now developing malignant ascites.  Last radiation treatment 3/12.  Due to  significant progression of his disease, patient has not been transition to full comfort/hospice care. - Therapeutic paracentesis performed on 3/24, 3.3 L removed.     Chronic portal vein thrombosis New perigastric and and perisplenic varices -This is new diagnosis.  Not a candidate for any form of anticoagulation with a high bleeding risk.   Anemia of chronic disease Hemoglobin baseline around 8.  1 unit received during this hospitalization on 3/21   AKI-- >?hepatorenal syndrome -IVF Renal ultrasound negative   Decompensated hepatic cirrhosis Chronic thrombocytopenia -Same showing chronically elevated AST/ALT.  -trend   Insulin-dependent DM type II Would hold off on any further diabetic regimen   GERD PPI and sucralfate   Chronic constipation -Continue MiraLAX and Senokot.   ?acute urinary retention vs ascites on bladder scan -appears to be urinating on own-- resolved   Patient seen by oncology and palliative care during this hospitalization.  Due to advanced malignancy and limited treatment option patient will go home with hospice care.  Discharge patient today with home hospice. Due to cost, hard scripts printed so spouse can take it to [facility] pharmacy    DVT prophylaxis: SCDs Start: 07/28/23 2347 Place TED hose Start: 07/28/23 2347      Code Status: Do not attempt resuscitation (DNR) PRE-ARREST INTERVENTIONS DESIRED Family Communication:   Hopefully we can discharge patient home today with hospice care    Subjective: No complaints.  Wife at bedside Discharge home with hospice   Examination:  General exam: Appears calm and comfortable, cachectic frail and appears chronically very ill Respiratory system: Clear to auscultation. Respiratory effort normal. Cardiovascular system: S1 & S2 heard, RRR. No JVD, murmurs, rubs, gallops or clicks. No pedal edema. Gastrointestinal system: Abdomen is nondistended, soft and nontender. No organomegaly  or masses felt.  Normal bowel sounds heard. Central nervous system: Alert and oriented to name and place Extremities: Symmetric 5 x 5 power. Skin: No rashes, lesions or ulcers Psychiatry: Judgement and insight poor                Diet Orders (From admission, onward)     Start     Ordered   07/28/23 2347  Diet heart healthy/carb modified Room service appropriate? Yes; Fluid consistency: Thin  Diet effective now       Question Answer Comment  Diet-HS Snack? Nothing   Room service appropriate? Yes   Fluid consistency: Thin      07/28/23 2346            Objective: Vitals:   08/04/23 1317 08/04/23 2141 08/04/23 2300 08/05/23 0543  BP: (!) 112/59 (!) 114/57  121/72  Pulse: (!) 109 (!) 113 (!) 110   Resp: 16 16  18   Temp: 98.2 F (36.8 C) 98.1 F (36.7 C)  97.9 F (36.6 C)  TempSrc:      SpO2: 100% 100%  100%  Weight:      Height:        Intake/Output Summary (Last 24 hours) at 08/05/2023 1312 Last data filed at 08/04/2023 2300 Gross per 24 hour  Intake 100 ml  Output --  Net 100 ml   Filed Weights   07/28/23 1802 07/29/23 0041  Weight: 58.6 kg 58.6 kg    Scheduled Meds:  famotidine  10 mg Oral Daily   ferrous sulfate  325 mg Oral Q breakfast   insulin aspart  0-5 Units Subcutaneous QHS   insulin aspart  0-6 Units Subcutaneous TID WC   oxyCODONE  15 mg Oral Q12H   pantoprazole  40 mg Oral BID   polyethylene glycol  17 g Oral Daily   senna-docusate  1 tablet Oral BID   sodium chloride flush  3 mL Intravenous Q12H   sodium chloride flush  3 mL Intravenous Q12H   sucralfate  1 g Oral BID   tamsulosin  0.4 mg Oral QPC supper   Continuous Infusions:  promethazine (PHENERGAN) injection (IM or IVPB)      Nutritional status     Body mass index is 18.54 kg/m.  Data Reviewed:   CBC: Recent Labs  Lab 07/30/23 0607 07/31/23 0332 07/31/23 1544 08/01/23 0413 08/02/23 0342  WBC 7.6 6.0  --  5.6 5.2  HGB 8.3* 6.9* 8.1* 7.4* 7.7*  HCT 26.4* 21.7* 25.2* 23.5*  24.6*  MCV 96.7 97.3  --  95.9 98.0  PLT 160 114*  --  104* 103*   Basic Metabolic Panel: Recent Labs  Lab 07/31/23 0332 08/01/23 0413 08/02/23 0342 08/03/23 0330 08/04/23 0815  NA 132* 134* 135 138 136  K 4.0 4.1 4.4 4.4 4.2  CL 102 104 105 110 107  CO2 21* 19* 18* 16* 16*  GLUCOSE 162* 162* 178* 171* 234*  BUN 32* 44* 44* 36* 31*  CREATININE 1.76* 2.10* 1.84* 1.58* 1.50*  CALCIUM 8.9 8.8* 9.0 8.5* 8.9   GFR: Estimated Creatinine Clearance: 38.5 mL/min (A) (by C-G formula based on SCr of 1.5 mg/dL (H)). Liver Function Tests: Recent Labs  Lab 07/31/23 0332  AST 68*  ALT 18  ALKPHOS 131*  BILITOT 1.4*  PROT 7.0  ALBUMIN 3.1*   No results for input(s): "LIPASE", "AMYLASE" in the last 168 hours. No results for input(s): "AMMONIA" in the last 168 hours. Coagulation Profile: No results for input(s): "  INR", "PROTIME" in the last 168 hours. Cardiac Enzymes: No results for input(s): "CKTOTAL", "CKMB", "CKMBINDEX", "TROPONINI" in the last 168 hours. BNP (last 3 results) No results for input(s): "PROBNP" in the last 8760 hours. HbA1C: No results for input(s): "HGBA1C" in the last 72 hours. CBG: Recent Labs  Lab 08/04/23 1118 08/04/23 1706 08/04/23 2134 08/05/23 0743 08/05/23 1200  GLUCAP 203* 208* 219* 224* 215*   Lipid Profile: No results for input(s): "CHOL", "HDL", "LDLCALC", "TRIG", "CHOLHDL", "LDLDIRECT" in the last 72 hours. Thyroid Function Tests: No results for input(s): "TSH", "T4TOTAL", "FREET4", "T3FREE", "THYROIDAB" in the last 72 hours. Anemia Panel: No results for input(s): "VITAMINB12", "FOLATE", "FERRITIN", "TIBC", "IRON", "RETICCTPCT" in the last 72 hours. Sepsis Labs: No results for input(s): "PROCALCITON", "LATICACIDVEN" in the last 168 hours.  Recent Results (from the past 240 hours)  Resp panel by RT-PCR (RSV, Flu A&B, Covid) Anterior Nasal Swab     Status: None   Collection Time: 07/28/23  6:41 PM   Specimen: Anterior Nasal Swab  Result  Value Ref Range Status   SARS Coronavirus 2 by RT PCR NEGATIVE NEGATIVE Final    Comment: (NOTE) SARS-CoV-2 target nucleic acids are NOT DETECTED.  The SARS-CoV-2 RNA is generally detectable in upper respiratory specimens during the acute phase of infection. The lowest concentration of SARS-CoV-2 viral copies this assay can detect is 138 copies/mL. A negative result does not preclude SARS-Cov-2 infection and should not be used as the sole basis for treatment or other patient management decisions. A negative result may occur with  improper specimen collection/handling, submission of specimen other than nasopharyngeal swab, presence of viral mutation(s) within the areas targeted by this assay, and inadequate number of viral copies(<138 copies/mL). A negative result must be combined with clinical observations, patient history, and epidemiological information. The expected result is Negative.  Fact Sheet for Patients:  BloggerCourse.com  Fact Sheet for Healthcare Providers:  SeriousBroker.it  This test is no t yet approved or cleared by the [place] FDA and  has been authorized for detection and/or diagnosis of SARS-CoV-2 by FDA under an Emergency Use Authorization (EUA). This EUA will remain  in effect (meaning this test can be used) for the duration of the COVID-19 declaration under Section 564(b)(1) of the Act, 21 U.S.C.section 360bbb-3(b)(1), unless the authorization is terminated  or revoked sooner.       Influenza A by PCR NEGATIVE NEGATIVE Final   Influenza B by PCR NEGATIVE NEGATIVE Final    Comment: (NOTE) The Xpert Xpress SARS-CoV-2/FLU/RSV plus assay is intended as an aid in the diagnosis of influenza from Nasopharyngeal swab specimens and should not be used as a sole basis for treatment. Nasal washings and aspirates are unacceptable for Xpert Xpress SARS-CoV-2/FLU/RSV testing.  Fact Sheet for  Patients: BloggerCourse.com  Fact Sheet for Healthcare Providers: SeriousBroker.it  This test is not yet approved or cleared by the [place] FDA and has been authorized for detection and/or diagnosis of SARS-CoV-2 by FDA under an Emergency Use Authorization (EUA). This EUA will remain in effect (meaning this test can be used) for the duration of the COVID-19 declaration under Section 564(b)(1) of the Act, 21 U.S.C. section 360bbb-3(b)(1), unless the authorization is terminated or revoked.     Resp Syncytial Virus by PCR NEGATIVE NEGATIVE Final    Comment: (NOTE) Fact Sheet for Patients: BloggerCourse.com  Fact Sheet for Healthcare Providers: SeriousBroker.it  This test is not yet approved or cleared by the [place] and has been authorized for  detection and/or diagnosis of SARS-CoV-2 by FDA under an Emergency Use Authorization (EUA). This EUA will remain in effect (meaning this test can be used) for the duration of the COVID-19 declaration under Section 564(b)(1) of the Act, 21 U.S.C. section 360bbb-3(b)(1), unless the authorization is terminated or revoked.  Performed at [facility], [address], [place], [place] [address]   Body fluid culture w Gram Stain     Status: None   Collection Time: 07/30/23 12:24 PM   Specimen: PATH Cytology Peritoneal fluid  Result Value Ref Range Status   Specimen Description   Final    PERITONEAL Performed at [facility], [address] [place], [place] [address]    Special Requests   Final    NONE Performed at [facility], [address], [place], [place] [address]    Gram Stain   Final    FEW WBC PRESENT, PREDOMINANTLY PMN NO ORGANISMS SEEN    Culture   Final    NO GROWTH 3 DAYS Performed at [facility], [address], [place], [place] [address]     Report Status 08/02/2023 FINAL  Final         Radiology Studies: US Paracentesis Result Date: 08/03/2023 INDICATION: Patient with history of stage IV hepatocellular carcinoma, cirrhosis, chronic portal vein thrombosis, anemia, acute kidney injury, recurrent ascites. Request received for diagnostic and therapeutic paracentesis up to 6 liters. EXAM: ULTRASOUND GUIDED DIAGNOSTIC AND THERAPEUTIC PARACENTESIS MEDICATIONS: 8 mL 1% lidocaine COMPLICATIONS: None immediate. PROCEDURE: Informed written consent was obtained from the patient after a discussion of the risks, benefits and alternatives to treatment. A timeout was performed prior to the initiation of the procedure. Initial ultrasound scanning demonstrates a moderate amount of ascites within the right mid to lower abdominal quadrant. The right mid to lower abdomen was prepped and draped in the usual sterile fashion. 1% lidocaine was used for local anesthesia. Following this, a 19 gauge, 7-cm, Yueh catheter was introduced. An ultrasound image was saved for documentation purposes. The paracentesis was performed. The catheter was removed and a dressing was applied. The patient tolerated the procedure well without immediate post procedural complication. FINDINGS: A total of approximately 3.3 liters of bloody fluid was removed. Samples were sent to the laboratory as requested by the clinical team. IMPRESSION: Successful ultrasound-guided diagnostic and therapeutic paracentesis yielding 3.3 liters of peritoneal fluid. Performed by: Artemio Aly Electronically Signed   By: Corlis Leak M.D.   On: 08/03/2023 16:17           LOS: 8 days   Time spent= 35 mins    Miguel Rota, MD [facility]  If 7PM-7AM, please contact night-coverage  08/05/2023, 1:12 PM

## 2023-08-05 NOTE — Care Management Important Message (Signed)
 Important Message  Patient Details No IM Letter given due to Discharging with Hospice Name: Troy [name] MRN: [id number] Date of Birth: Mar 13, 1956   Important Message Given:  No     Caren Macadam 08/05/2023, 11:25 AM

## 2023-08-05 NOTE — Discharge Summary (Signed)
 Physician Discharge Summary  Troy [name] ZOX:[id number] DOB: [birth year] DOA: 07/28/2023  PCP: [name], MD  Admit date: 07/28/2023 Discharge date: 08/05/2023  Admitted From: Home Disposition:  Home w/ Hospice  Recommendations for Outpatient Follow-up:  Follow up with PCP in 1-2 weeks Please obtain BMP/CBC in one week your next doctors visit.  Hard scripts for prescriptions given to be taken to [facility] pharmacy.     Discharge Condition: Stable CODE STATUS: DNR Diet recommendation: Regular  Brief/Interim Summary: Brief Narrative:   68 y.o. male with medical history significant of stage IV hepatocellular carcinoma status post radiation and TREC 2023 currently on chemotherapy, hepatic cirrhosis with esophageal varices, chronic thrombocytopenia, chronic portal vein thrombosis, history of recurrent GI bleed due to hepatic gastropathy, chronic blood loss anemia, insulin-dependent DM type II, essential hypertension, iron deficiency anemia, chronic constipation and chronic hypokalemia presented emergency department complaining of abdominal pain.  Found to have malignant ascites secondary to stage IV hepatocellular carcinoma.  S/p paracentesis 3/24 for symptom relief.  Wife agreeable to go home with hospice and will plan for d/c on 3/26-- changing nausea medications 3/25 as zofran does not work for him and monitoring for response.  Eventually after extensive conversation by previous provider with the family, patient was transition to comfort care.  Will discharge him home today  Assessment & Plan:  Principal Problem:   Abdominal pain Active Problems:   Failure to thrive in adult   Portal vein thrombosis with liver mass concerning for malignancy    Portal hypertensive gastropathy (HCC)   Secondary esophageal varices without bleeding (HCC)   Hepatic cirrhosis due to chronic hepatitis C infection (HCC)   Insulin dependent type 2 diabetes mellitus (HCC)   Essential hypertension   Hepatocellular  carcinoma (HCC)   Cancer related pain   Chronic idiopathic thrombocytopenia (HCC)   Chronic anemia   Encounter for palliative care    Suspected malignant ascites in advanced stage IV hepatocellular carcinoma Hepatic cirrhosis with esophageal varices Portal hypertensive gastropathy -Unfortunately patient has stage IV hepatocellular carcinoma now developing malignant ascites.  Last radiation treatment 3/12.  Due to significant progression of his disease, patient has not been transition to full comfort/hospice care. - Therapeutic paracentesis performed on 3/24, 3.3 L removed.     Chronic portal vein thrombosis New perigastric and and perisplenic varices -This is new diagnosis.  Not a candidate for any form of anticoagulation with a high bleeding risk.   Anemia of chronic disease Hemoglobin baseline around 8.  1 unit received during this hospitalization on 3/21   AKI-- >?hepatorenal syndrome -IVF Renal ultrasound negative   Decompensated hepatic cirrhosis Chronic thrombocytopenia -Same showing chronically elevated AST/ALT.  -trend   Insulin-dependent DM type II Would hold off on any further diabetic regimen   GERD PPI and sucralfate   Chronic constipation -Continue MiraLAX and Senokot.   ?acute urinary retention vs ascites on bladder scan -appears to be urinating on own-- resolved   Patient seen by oncology and palliative care during this hospitalization.  Due to advanced malignancy and limited treatment option patient will go home with hospice care.  Discharge patient today with home hospice. Due to cost, hard scripts printed so spouse can take it to [facility] pharmacy    DVT prophylaxis: SCDs Start: 07/28/23 2347 Place TED hose Start: 07/28/23 2347      Code Status: Do not attempt resuscitation (DNR) PRE-ARREST INTERVENTIONS DESIRED Family Communication:   Hopefully we can discharge patient home today with hospice care  Subjective: No complaints.  Wife at  bedside Discharge home with hospice   Examination:  General exam: Appears calm and comfortable, cachectic frail and appears chronically very ill Respiratory system: Clear to auscultation. Respiratory effort normal. Cardiovascular system: S1 & S2 heard, RRR. No JVD, murmurs, rubs, gallops or clicks. No pedal edema. Gastrointestinal system: Abdomen is nondistended, soft and nontender. No organomegaly or masses felt. Normal bowel sounds heard. Central nervous system: Alert and oriented to name and place Extremities: Symmetric 5 x 5 power. Skin: No rashes, lesions or ulcers Psychiatry: Judgement and insight poor    Discharge Diagnoses:  Principal Problem:   Abdominal pain Active Problems:   Failure to thrive in adult   Portal vein thrombosis with liver mass concerning for malignancy    Portal hypertensive gastropathy (HCC)   Secondary esophageal varices without bleeding (HCC)   Hepatic cirrhosis due to chronic hepatitis C infection (HCC)   Insulin dependent type 2 diabetes mellitus (HCC)   Essential hypertension   Hepatocellular carcinoma (HCC)   Cancer related pain   Chronic idiopathic thrombocytopenia (HCC)   Chronic anemia   Encounter for palliative care      Discharge Exam: Vitals:   08/04/23 2300 08/05/23 0543  BP:  121/72  Pulse: (!) 110   Resp:  18  Temp:  97.9 F (36.6 C)  SpO2:  100%   Vitals:   08/04/23 1317 08/04/23 2141 08/04/23 2300 08/05/23 0543  BP: (!) 112/59 (!) 114/57  121/72  Pulse: (!) 109 (!) 113 (!) 110   Resp: 16 16  18   Temp: 98.2 F (36.8 C) 98.1 F (36.7 C)  97.9 F (36.6 C)  TempSrc:      SpO2: 100% 100%  100%  Weight:      Height:          Discharge Instructions  Discharge Instructions     meds to beds pharmacy consult ([facility] ONLY)   Complete by: As directed       Allergies as of 08/05/2023   No Known Allergies      Medication List     STOP taking these medications    amLODipine 10 MG tablet Commonly  known as: NORVASC   carboxymethylcellulose 0.5 % Soln Commonly known as: REFRESH PLUS   dicyclomine 10 MG capsule Commonly known as: BENTYL   empagliflozin 25 MG Tabs tablet Commonly known as: JARDIANCE   lisinopril 20 MG tablet Commonly known as: ZESTRIL   magic mouthwash (nystatin, lidocaine, diphenhydrAMINE, alum & mag hydroxide) suspension   morphine 15 MG 12 hr tablet Commonly known as: MS CONTIN   ondansetron 8 MG tablet Commonly known as: ZOFRAN   Semglee (yfgn) 100 UNIT/ML Pen Generic drug: insulin glargine-yfgn       TAKE these medications    famotidine 20 MG tablet Commonly known as: PEPCID Take 1 tablet (20 mg total) by mouth daily.   HYDROcodone-acetaminophen 5-325 MG tablet Commonly known as: NORCO/VICODIN Take 1 tablet by mouth every 6 (six) hours as needed for severe pain (pain score 7-10) or moderate pain (pain score 4-6). What changed: reasons to take this   ondansetron 4 MG disintegrating tablet Commonly known as: ZOFRAN-ODT Take 1 tablet (4 mg total) by mouth every 8 (eight) hours as needed.   oxyCODONE 15 mg 12 hr tablet Commonly known as: OXYCONTIN Take 1 tablet (15 mg total) by mouth every 12 (twelve) hours.   pantoprazole 40 MG tablet Commonly known as: PROTONIX Take 1 tablet (40 mg total) by mouth  2 (two) times daily before a meal.   polyethylene glycol powder 17 GM/SCOOP powder Commonly known as: GLYCOLAX/MIRALAX Take 17 g by mouth daily.   prochlorperazine 10 MG tablet Commonly known as: COMPAZINE Take 1 tablet (10 mg total) by mouth every 6 (six) hours as needed for nausea or vomiting.   promethazine 25 MG tablet Commonly known as: PHENERGAN Take 1 tablet (25 mg total) by mouth every 6 (six) hours as needed for refractory nausea / vomiting.   senna-docusate 8.6-50 MG tablet Commonly known as: Senokot-S Take 1 tablet by mouth 2 (two) times daily.   sucralfate 1 g tablet Commonly known as: CARAFATE Take 1 tablet (1 g total)  by mouth 2 (two) times daily for 10 days.        Follow-up Information     [facility] Follow up.   Why: [facility] will provide hospice services in the home after discharge. Contact information: [address] [place] [address] [phone]               No Known Allergies  You were cared for by a hospitalist during your hospital stay. If you have any questions about your discharge medications or the care you received while you were in the hospital after you are discharged, you can call the unit and asked to speak with the hospitalist on call if the hospitalist that took care of you is not available. Once you are discharged, your primary care physician will handle any further medical issues. Please note that no refills for any discharge medications will be authorized once you are discharged, as it is imperative that you return to your primary care physician (or establish a relationship with a primary care physician if you do not have one) for your aftercare needs so that they can reassess your need for medications and monitor your lab values.  You were cared for by a hospitalist during your hospital stay. If you have any questions about your discharge medications or the care you received while you were in the hospital after you are discharged, you can call the unit and asked to speak with the hospitalist on call if the hospitalist that took care of you is not available. Once you are discharged, your primary care physician will handle any further medical issues. Please note that NO REFILLS for any discharge medications will be authorized once you are discharged, as it is imperative that you return to your primary care physician (or establish a relationship with a primary care physician if you do not have one) for your aftercare needs so that they can reassess your need for medications and monitor your lab values.  Please request your  Prim.MD to go over all Hospital Tests and Procedure/Radiological results at the follow up, please get all Hospital records sent to your Prim MD by signing hospital release before you go home.  Get CBC, CMP, 2 view Chest X ray checked  by Primary MD during your next visit or SNF MD in 5-7 days ( we routinely change or add medications that can affect your baseline labs and fluid status, therefore we recommend that you get the mentioned basic workup next visit with your PCP, your PCP may decide not to get them or add new tests based on their clinical decision)  On your next visit with your primary care physician please Get Medicines reviewed and adjusted.  If you experience worsening of your admission symptoms, develop shortness of breath,  life threatening emergency, suicidal or homicidal thoughts you must seek medical attention immediately by calling 911 or calling your MD immediately  if symptoms less severe.  You Must read complete instructions/literature along with all the possible adverse reactions/side effects for all the Medicines you take and that have been prescribed to you. Take any new Medicines after you have completely understood and accpet all the possible adverse reactions/side effects.   Do not drive, operate heavy machinery, perform activities at heights, swimming or participation in water activities or provide baby sitting services if your were admitted for syncope or siezures until you have seen by Primary MD or a Neurologist and advised to do so again.  Do not drive when taking Pain medications.   Procedures/Studies: US Paracentesis Result Date: 08/03/2023 INDICATION: Patient with history of stage IV hepatocellular carcinoma, cirrhosis, chronic portal vein thrombosis, anemia, acute kidney injury, recurrent ascites. Request received for diagnostic and therapeutic paracentesis up to 6 liters. EXAM: ULTRASOUND GUIDED DIAGNOSTIC AND THERAPEUTIC PARACENTESIS MEDICATIONS: 8 mL 1%  lidocaine COMPLICATIONS: None immediate. PROCEDURE: Informed written consent was obtained from the patient after a discussion of the risks, benefits and alternatives to treatment. A timeout was performed prior to the initiation of the procedure. Initial ultrasound scanning demonstrates a moderate amount of ascites within the right mid to lower abdominal quadrant. The right mid to lower abdomen was prepped and draped in the usual sterile fashion. 1% lidocaine was used for local anesthesia. Following this, a 19 gauge, 7-cm, Yueh catheter was introduced. An ultrasound image was saved for documentation purposes. The paracentesis was performed. The catheter was removed and a dressing was applied. The patient tolerated the procedure well without immediate post procedural complication. FINDINGS: A total of approximately 3.3 liters of bloody fluid was removed. Samples were sent to the laboratory as requested by the clinical team. IMPRESSION: Successful ultrasound-guided diagnostic and therapeutic paracentesis yielding 3.3 liters of peritoneal fluid. Performed by: Artemio Aly Electronically Signed   By: Corlis Leak M.D.   On: 08/03/2023 16:17   US RENAL Result Date: 07/31/2023 CLINICAL DATA:  Acute kidney injury EXAM: RENAL / URINARY TRACT ULTRASOUND COMPLETE COMPARISON:  CT 07/28/2023 FINDINGS: Right Kidney: Renal measurements: 10.9 x 4.6 x 4.7 cm = volume: 124.7 mL. Cortex appears echogenic. No mass or hydronephrosis. Left Kidney: Renal measurements: 12.1 x 5.7 x 5.8 cm = volume: 208.9 mL. Echogenicity within normal limits. No mass or hydronephrosis visualized. Bladder: Appears normal for degree of bladder distention. Other: Liver cirrhosis. At least moderate ascites in the right upper quadrant IMPRESSION: 1. Negative for hydronephrosis. Echogenic right renal cortex as can be seen with medical renal disease. 2. Liver cirrhosis with at least moderate ascites in the right upper quadrant. Electronically Signed   By:  Jasmine Pang M.D.   On: 07/31/2023 19:06   US Paracentesis Result Date: 07/30/2023 INDICATION: Patient with history of stage IV hepatocellular carcinoma, cirrhosis, chronic portal vein thrombosis, anemia, acute kidney injury, ascites. Request received for diagnostic and therapeutic paracentesis. EXAM: ULTRASOUND GUIDED DIAGNOSTIC AND THERAPEUTIC PARACENTESIS MEDICATIONS: 6 mL 1% lidocaine COMPLICATIONS: None immediate. PROCEDURE: Informed written consent was obtained from the patient after a discussion of the risks, benefits and alternatives to treatment. A timeout was performed prior to the initiation of the procedure. Initial ultrasound scanning demonstrates a moderate amount of ascites within the right mid to lower abdominal quadrant. The right mid to lower abdomen was prepped and draped in the usual sterile fashion. 1% lidocaine was used for local anesthesia. Following  this, a 19 gauge, 7-cm, Yueh catheter was introduced. An ultrasound image was saved for documentation purposes. The paracentesis was performed. The catheter was removed and a dressing was applied. The patient tolerated the procedure well without immediate post procedural complication. FINDINGS: A total of approximately 2.4 liters of bloody fluid was removed. Samples were sent to the laboratory as requested by the clinical team. IMPRESSION: Successful ultrasound-guided diagnostic and therapeutic paracentesis yielding 2.4 liters of peritoneal fluid. Performed by: Jeananne Rama, PA-C Electronically Signed   By: Judie Petit.  Shick M.D.   On: 07/30/2023 14:28   CT ABDOMEN PELVIS W CONTRAST Result Date: 07/28/2023 CLINICAL DATA:  Lower abdominal pain and swelling status post radiation. Generalized weakness and decreased appetite. History of liver cancer, last treatment on Wednesday. EXAM: CT ABDOMEN AND PELVIS WITH CONTRAST TECHNIQUE: Multidetector CT imaging of the abdomen and pelvis was performed using the standard protocol following bolus administration  of intravenous contrast. RADIATION DOSE REDUCTION: This exam was performed according to the departmental dose-optimization program which includes automated exposure control, adjustment of the mA and/or kV according to patient size and/or use of iterative reconstruction technique. CONTRAST:  OMNIPAQUE IOHEXOL 300 MG/ML  SOLN COMPARISON:  06/11/2023 FINDINGS: Lower chest: Bibasilar atelectasis/scarring. Retained metallic pellets in the lung bases. Hepatobiliary: Cirrhosis. Similar size of the mass centered in the left lobe of the liver measuring approximately 6 x 9 cm. Decompressed gallbladder. No biliary dilation. Pancreas: No ductal dilation or inflammatory changes. Spleen: Unremarkable. Adrenals/Urinary Tract: Unremarkable adrenal glands and kidneys. Unremarkable bladder. Stomach/Bowel: No bowel obstruction. Wall thickening about the small bowel and colon favored due to portal congestive colopathy/enteropathy. Normal appendix. Stomach is within normal limits. Vascular/Lymphatic: Extensive mesenteric and retroperitoneal adenopathy. Index lymph nodes as follows: Left periaortic node (series 2/image 42) measures 7.2 x 5.6 cm, previously 7.8 x 6.1 cm; Left periaortic node (series 2/image 35) measures 5.4 x 4.3 cm, previously 5.2 x 4.3 cm; Porta hepatis node (series 2/image 34) measures 5.4 x 4.9 cm, previously 5.0 x 4.1 cm. 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 For example 1.3 cm nodule anterior to the gastric body (series 2/image 28). Unchanged size of the right rectus sheath metastasis measuring 2.8 x 3.3 cm. 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 Multiple varices contain nonocclusive thrombus. For example in a perigastric varix (series 7/image 60, a splenic varix (7/71-77), and the left renal vein (7/65). Reproductive: No acute abnormality. Other: Large volume low-density  abdominopelvic ascites. No free intraperitoneal air. No abscess. Musculoskeletal: No acute fracture or destructive osseous lesion. IMPRESSION: 1. Cirrhosis with similar size of the mass centered in the left lobe of the liver. 2. Extensive mesenteric and retroperitoneal adenopathy, some of which are slightly decreased in size while others are slightly increased in size. 3. 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 4. 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 New thrombus in the perigastric and perisplenic varices. 5. Wall thickening about the small bowel and colon favored due to portal congestive colopathy/enteropathy. 6. Large volume low-density abdominopelvic ascites. These results were called by telephone at the time of interpretation on 07/28/2023 at 10:28 pm to provider [facility] , who verbally acknowledged these results. Electronically Signed   By: Minerva Fester M.D.   On: 07/28/2023 22:32   DG Chest Portable 1 View Result Date: 07/28/2023 CLINICAL DATA:  History of diabetes and liver cancer, presenting with generalized weakness. EXAM:  PORTABLE CHEST 1 VIEW COMPARISON:  June 11, 2023 FINDINGS: The right-sided venous Port-A-Cath seen on the prior study has been removed. The heart size and mediastinal contours are within normal limits. Low lung volumes are noted. A stable subcentimeter calcified lung nodule is seen within the periphery of the right lung base. There is no evidence of acute infiltrate, pleural effusion or pneumothorax. Numerous tiny radiopaque foreign bodies are again seen overlying the left upper quadrant. The visualized skeletal structures are unremarkable. IMPRESSION: Low lung volumes without evidence of acute or active cardiopulmonary disease. Electronically Signed   By: Aram Candela M.D.   On: 07/28/2023 19:51     The results of significant diagnostics from this hospitalization (including imaging, microbiology,  ancillary and laboratory) are listed below for reference.     Microbiology: Recent Results (from the past 240 hours)  Resp panel by RT-PCR (RSV, Flu A&B, Covid) Anterior Nasal Swab     Status: None   Collection Time: 07/28/23  6:41 PM   Specimen: Anterior Nasal Swab  Result Value Ref Range Status   SARS Coronavirus 2 by RT PCR NEGATIVE NEGATIVE Final    Comment: (NOTE) SARS-CoV-2 target nucleic acids are NOT DETECTED.  The SARS-CoV-2 RNA is generally detectable in upper respiratory specimens during the acute phase of infection. The lowest concentration of SARS-CoV-2 viral copies this assay can detect is 138 copies/mL. A negative result does not preclude SARS-Cov-2 infection and should not be used as the sole basis for treatment or other patient management decisions. A negative result may occur with  improper specimen collection/handling, submission of specimen other than nasopharyngeal swab, presence of viral mutation(s) within the areas targeted by this assay, and inadequate number of viral copies(<138 copies/mL). A negative result must be combined with clinical observations, patient history, and epidemiological information. The expected result is Negative.  Fact Sheet for Patients:  BloggerCourse.com  Fact Sheet for Healthcare Providers:  SeriousBroker.it  This test is no t yet approved or cleared by the [place] FDA and  has been authorized for detection and/or diagnosis of SARS-CoV-2 by FDA under an Emergency Use Authorization (EUA). This EUA will remain  in effect (meaning this test can be used) for the duration of the COVID-19 declaration under Section 564(b)(1) of the Act, 21 U.S.C.section 360bbb-3(b)(1), unless the authorization is terminated  or revoked sooner.       Influenza A by PCR NEGATIVE NEGATIVE Final   Influenza B by PCR NEGATIVE NEGATIVE Final    Comment: (NOTE) The Xpert Xpress SARS-CoV-2/FLU/RSV  plus assay is intended as an aid in the diagnosis of influenza from Nasopharyngeal swab specimens and should not be used as a sole basis for treatment. Nasal washings and aspirates are unacceptable for Xpert Xpress SARS-CoV-2/FLU/RSV testing.  Fact Sheet for Patients: BloggerCourse.com  Fact Sheet for Healthcare Providers: SeriousBroker.it  This test is not yet approved or cleared by the [place] FDA and has been authorized for detection and/or diagnosis of SARS-CoV-2 by FDA under an Emergency Use Authorization (EUA). This EUA will remain in effect (meaning this test can be used) for the duration of the COVID-19 declaration under Section 564(b)(1) of the Act, 21 U.S.C. section 360bbb-3(b)(1), unless the authorization is terminated or revoked.     Resp Syncytial Virus by PCR NEGATIVE NEGATIVE Final    Comment: (NOTE) Fact Sheet for Patients: BloggerCourse.com  Fact Sheet for Healthcare Providers: SeriousBroker.it  This test is not yet approved or cleared by the [place] and has been authorized  for detection and/or diagnosis of SARS-CoV-2 by FDA under an Emergency Use Authorization (EUA). This EUA will remain in effect (meaning this test can be used) for the duration of the COVID-19 declaration under Section 564(b)(1) of the Act, 21 U.S.C. section 360bbb-3(b)(1), unless the authorization is terminated or revoked.  Performed at [facility], [address], [place], [place] [address]   Body fluid culture w Gram Stain     Status: None   Collection Time: 07/30/23 12:24 PM   Specimen: PATH Cytology Peritoneal fluid  Result Value Ref Range Status   Specimen Description   Final    PERITONEAL Performed at [facility], [address] [place], [place] [address]    Special Requests   Final    NONE Performed at [facility], [address], [place], [place] [address]    Gram Stain   Final    FEW WBC PRESENT, PREDOMINANTLY PMN NO ORGANISMS SEEN    Culture   Final    NO GROWTH 3 DAYS Performed at [facility], [address] [place], [place] [address]    Report Status 08/02/2023 FINAL  Final     Labs: BNP (last 3 results) No results for input(s): "BNP" in the last 8760 hours. Basic Metabolic Panel: Recent Labs  Lab 07/31/23 0332 08/01/23 0413 08/02/23 0342 08/03/23 0330 08/04/23 0815  NA 132* 134* 135 138 136  K 4.0 4.1 4.4 4.4 4.2  CL 102 104 105 110 107  CO2 21* 19* 18* 16* 16*  GLUCOSE 162* 162* 178* 171* 234*  BUN 32* 44* 44* 36* 31*  CREATININE 1.76* 2.10* 1.84* 1.58* 1.50*  CALCIUM 8.9 8.8* 9.0 8.5* 8.9   Liver Function Tests: Recent Labs  Lab 07/31/23 0332  AST 68*  ALT 18  ALKPHOS 131*  BILITOT 1.4*  PROT 7.0  ALBUMIN 3.1*   No results for input(s): "LIPASE", "AMYLASE" in the last 168 hours. No results for input(s): "AMMONIA" in the last 168 hours. CBC: Recent Labs  Lab 07/30/23 0607 07/31/23 0332 07/31/23 1544 08/01/23 0413 08/02/23 0342  WBC 7.6 6.0  --  5.6 5.2  HGB 8.3* 6.9* 8.1* 7.4* 7.7*  HCT 26.4* 21.7* 25.2* 23.5* 24.6*  MCV 96.7 97.3  --  95.9 98.0  PLT 160 114*  --  104* 103*   Cardiac Enzymes: No results for input(s): "CKTOTAL", "CKMB", "CKMBINDEX", "TROPONINI" in the last 168 hours. BNP: Invalid input(s): "POCBNP" CBG: Recent Labs  Lab 08/04/23 1118 08/04/23 1706 08/04/23 2134 08/05/23 0743 08/05/23 1200  GLUCAP 203* 208* 219* 224* 215*   D-Dimer No results for input(s): "DDIMER" in the last 72 hours. Hgb A1c No results for input(s): "HGBA1C" in the last 72 hours. Lipid Profile No results for input(s): "CHOL", "HDL", "LDLCALC", "TRIG", "CHOLHDL", "LDLDIRECT" in the last 72 hours. Thyroid function studies No results for input(s): "TSH", "T4TOTAL", "T3FREE", "THYROIDAB" in the last 72 hours.  Invalid input(s):  "FREET3" Anemia work up No results for input(s): "VITAMINB12", "FOLATE", "FERRITIN", "TIBC", "IRON", "RETICCTPCT" in the last 72 hours. Urinalysis    Component Value Date/Time   COLORURINE YELLOW 07/29/2023 1050   APPEARANCEUR CLEAR 07/29/2023 1050   LABSPEC >1.046 (H) 07/29/2023 1050   PHURINE 5.0 07/29/2023 1050   GLUCOSEU 50 (A) 07/29/2023 1050   HGBUR NEGATIVE 07/29/2023 1050   BILIRUBINUR NEGATIVE 07/29/2023 1050   KETONESUR 5 (A) 07/29/2023 1050   PROTEINUR NEGATIVE 07/29/2023 1050   NITRITE NEGATIVE 07/29/2023 1050   LEUKOCYTESUR  NEGATIVE 07/29/2023 1050   Sepsis Labs Recent Labs  Lab 07/30/23 0607 07/31/23 0332 08/01/23 0413 08/02/23 0342  WBC 7.6 6.0 5.6 5.2   Microbiology Recent Results (from the past 240 hours)  Resp panel by RT-PCR (RSV, Flu A&B, Covid) Anterior Nasal Swab     Status: None   Collection Time: 07/28/23  6:41 PM   Specimen: Anterior Nasal Swab  Result Value Ref Range Status   SARS Coronavirus 2 by RT PCR NEGATIVE NEGATIVE Final    Comment: (NOTE) SARS-CoV-2 target nucleic acids are NOT DETECTED.  The SARS-CoV-2 RNA is generally detectable in upper respiratory specimens during the acute phase of infection. The lowest concentration of SARS-CoV-2 viral copies this assay can detect is 138 copies/mL. A negative result does not preclude SARS-Cov-2 infection and should not be used as the sole basis for treatment or other patient management decisions. A negative result may occur with  improper specimen collection/handling, submission of specimen other than nasopharyngeal swab, presence of viral mutation(s) within the areas targeted by this assay, and inadequate number of viral copies(<138 copies/mL). A negative result must be combined with clinical observations, patient history, and epidemiological information. The expected result is Negative.  Fact Sheet for Patients:  BloggerCourse.com  Fact Sheet for Healthcare  Providers:  SeriousBroker.it  This test is no t yet approved or cleared by the [place] FDA and  has been authorized for detection and/or diagnosis of SARS-CoV-2 by FDA under an Emergency Use Authorization (EUA). This EUA will remain  in effect (meaning this test can be used) for the duration of the COVID-19 declaration under Section 564(b)(1) of the Act, 21 U.S.C.section 360bbb-3(b)(1), unless the authorization is terminated  or revoked sooner.       Influenza A by PCR NEGATIVE NEGATIVE Final   Influenza B by PCR NEGATIVE NEGATIVE Final    Comment: (NOTE) The Xpert Xpress SARS-CoV-2/FLU/RSV plus assay is intended as an aid in the diagnosis of influenza from Nasopharyngeal swab specimens and should not be used as a sole basis for treatment. Nasal washings and aspirates are unacceptable for Xpert Xpress SARS-CoV-2/FLU/RSV testing.  Fact Sheet for Patients: BloggerCourse.com  Fact Sheet for Healthcare Providers: SeriousBroker.it  This test is not yet approved or cleared by the [place] FDA and has been authorized for detection and/or diagnosis of SARS-CoV-2 by FDA under an Emergency Use Authorization (EUA). This EUA will remain in effect (meaning this test can be used) for the duration of the COVID-19 declaration under Section 564(b)(1) of the Act, 21 U.S.C. section 360bbb-3(b)(1), unless the authorization is terminated or revoked.     Resp Syncytial Virus by PCR NEGATIVE NEGATIVE Final    Comment: (NOTE) Fact Sheet for Patients: BloggerCourse.com  Fact Sheet for Healthcare Providers: SeriousBroker.it  This test is not yet approved or cleared by the [place] FDA and has been authorized for detection and/or diagnosis of SARS-CoV-2 by FDA under an Emergency Use Authorization (EUA). This EUA will remain in effect (meaning this test can be  used) for the duration of the COVID-19 declaration under Section 564(b)(1) of the Act, 21 U.S.C. section 360bbb-3(b)(1), unless the authorization is terminated or revoked.  Performed at [facility], [address], [place], [place] [address]   Body fluid culture w Gram Stain     Status: None   Collection Time: 07/30/23 12:24 PM   Specimen: PATH Cytology Peritoneal fluid  Result Value Ref Range Status   Specimen Description   Final    PERITONEAL  Performed at [facility], [address], [place], [place] [address]    Special Requests   Final    NONE Performed at [facility], [address], [place], [place] [address]    Gram Stain   Final    FEW WBC PRESENT, PREDOMINANTLY PMN NO ORGANISMS SEEN    Culture   Final    NO GROWTH 3 DAYS Performed at  [facility], [address] [place], [place] [address]    Report Status 08/02/2023 FINAL  Final     Time coordinating discharge:  I have spent 35 minutes face to face with the patient and on the ward discussing the patients care, assessment, plan and disposition with other care givers. >50% of the time was devoted counseling the patient about the risks and benefits of treatment/Discharge disposition and coordinating care.   SIGNED:   Miguel Rota, MD  [facility] 08/05/2023, 2:41 PM   If 7PM-7AM, please contact night-coverage

## 2023-08-05 NOTE — Hospital Course (Addendum)
 Brief Narrative:   68 y.o. male with medical history significant of stage IV hepatocellular carcinoma status post radiation and TREC 2023 currently on chemotherapy, hepatic cirrhosis with esophageal varices, chronic thrombocytopenia, chronic portal vein thrombosis, history of recurrent GI bleed due to hepatic gastropathy, chronic blood loss anemia, insulin-dependent DM type II, essential hypertension, iron deficiency anemia, chronic constipation and chronic hypokalemia presented emergency department complaining of abdominal pain.  Found to have malignant ascites secondary to stage IV hepatocellular carcinoma.  S/p paracentesis 3/24 for symptom relief.  Wife agreeable to go home with hospice and will plan for d/c on 3/26-- changing nausea medications 3/25 as zofran does not work for him and monitoring for response.  Eventually after extensive conversation by previous provider with the family, patient was transition to comfort care.  Will discharge him home today  Assessment & Plan:  Principal Problem:   Abdominal pain Active Problems:   Failure to thrive in adult   Portal vein thrombosis with liver mass concerning for malignancy    Portal hypertensive gastropathy (HCC)   Secondary esophageal varices without bleeding (HCC)   Hepatic cirrhosis due to chronic hepatitis C infection (HCC)   Insulin dependent type 2 diabetes mellitus (HCC)   Essential hypertension   Hepatocellular carcinoma (HCC)   Cancer related pain   Chronic idiopathic thrombocytopenia (HCC)   Chronic anemia   Encounter for palliative care    Suspected malignant ascites in advanced stage IV hepatocellular carcinoma Hepatic cirrhosis with esophageal varices Portal hypertensive gastropathy -Unfortunately patient has stage IV hepatocellular carcinoma now developing malignant ascites.  Last radiation treatment 3/12.  Due to significant progression of his disease, patient has not been transition to full comfort/hospice care. -  Therapeutic paracentesis performed on 3/24, 3.3 L removed.     Chronic portal vein thrombosis New perigastric and and perisplenic varices -This is new diagnosis.  Not a candidate for any form of anticoagulation with a high bleeding risk.   Anemia of chronic disease Hemoglobin baseline around 8.  1 unit received during this hospitalization on 3/21   AKI-- >?hepatorenal syndrome -IVF Renal ultrasound negative   Decompensated hepatic cirrhosis Chronic thrombocytopenia -Same showing chronically elevated AST/ALT.  -trend   Insulin-dependent DM type II Would hold off on any further diabetic regimen   GERD PPI and sucralfate   Chronic constipation -Continue MiraLAX and Senokot.   ?acute urinary retention vs ascites on bladder scan -appears to be urinating on own-- resolved   Patient seen by oncology and palliative care during this hospitalization.  Due to advanced malignancy and limited treatment option patient will go home with hospice care.  Discharge patient today with home hospice. Due to cost, hard scripts printed so spouse can take it to [facility] pharmacy    DVT prophylaxis: SCDs Start: 07/28/23 2347 Place TED hose Start: 07/28/23 2347      Code Status: Do not attempt resuscitation (DNR) PRE-ARREST INTERVENTIONS DESIRED Family Communication:   Hopefully we can discharge patient home today with hospice care    Subjective: No complaints.  Wife at bedside Discharge home with hospice   Examination:  General exam: Appears calm and comfortable, cachectic frail and appears chronically very ill Respiratory system: Clear to auscultation. Respiratory effort normal. Cardiovascular system: S1 & S2 heard, RRR. No JVD, murmurs, rubs, gallops or clicks. No pedal edema. Gastrointestinal system: Abdomen is nondistended, soft and nontender. No organomegaly or masses felt. Normal bowel sounds heard. Central nervous system: Alert and oriented to name and place Extremities:  Symmetric  5 x 5 power. Skin: No rashes, lesions or ulcers Psychiatry: Judgement and insight poor

## 2023-08-11 ENCOUNTER — Other Ambulatory Visit

## 2023-08-11 ENCOUNTER — Encounter

## 2023-08-13 ENCOUNTER — Encounter (HOSPITAL_COMMUNITY): Payer: Self-pay

## 2023-08-13 ENCOUNTER — Telehealth (HOSPITAL_COMMUNITY): Payer: Self-pay | Admitting: Diagnostic Radiology

## 2023-08-13 ENCOUNTER — Other Ambulatory Visit (HOSPITAL_COMMUNITY): Payer: Self-pay | Admitting: Hospice and Palliative Medicine

## 2023-08-13 ENCOUNTER — Other Ambulatory Visit: Payer: Self-pay

## 2023-08-13 ENCOUNTER — Inpatient Hospital Stay (HOSPITAL_COMMUNITY)
Admission: EM | Admit: 2023-08-13 | Discharge: 2023-08-15 | DRG: 435 | Disposition: A | Discharge status: Hospice | Attending: Family Medicine | Admitting: Family Medicine

## 2023-08-13 DIAGNOSIS — E119 Type 2 diabetes mellitus without complications: Secondary | ICD-10-CM | POA: Diagnosis present

## 2023-08-13 DIAGNOSIS — R180 Malignant ascites: Secondary | ICD-10-CM

## 2023-08-13 DIAGNOSIS — Z515 Encounter for palliative care: Secondary | ICD-10-CM

## 2023-08-13 DIAGNOSIS — Z794 Long term (current) use of insulin: Secondary | ICD-10-CM

## 2023-08-13 DIAGNOSIS — Z79899 Other long term (current) drug therapy: Secondary | ICD-10-CM

## 2023-08-13 DIAGNOSIS — E7800 Pure hypercholesterolemia, unspecified: Secondary | ICD-10-CM | POA: Diagnosis present

## 2023-08-13 DIAGNOSIS — I10 Essential (primary) hypertension: Secondary | ICD-10-CM | POA: Diagnosis present

## 2023-08-13 DIAGNOSIS — Z87891 Personal history of nicotine dependence: Secondary | ICD-10-CM

## 2023-08-13 DIAGNOSIS — K3189 Other diseases of stomach and duodenum: Secondary | ICD-10-CM | POA: Diagnosis present

## 2023-08-13 DIAGNOSIS — I81 Portal vein thrombosis: Secondary | ICD-10-CM | POA: Diagnosis present

## 2023-08-13 DIAGNOSIS — E875 Hyperkalemia: Secondary | ICD-10-CM | POA: Diagnosis present

## 2023-08-13 DIAGNOSIS — R64 Cachexia: Secondary | ICD-10-CM | POA: Diagnosis present

## 2023-08-13 DIAGNOSIS — N179 Acute kidney failure, unspecified: Secondary | ICD-10-CM | POA: Diagnosis present

## 2023-08-13 DIAGNOSIS — Z66 Do not resuscitate: Secondary | ICD-10-CM | POA: Diagnosis present

## 2023-08-13 DIAGNOSIS — C220 Liver cell carcinoma: Secondary | ICD-10-CM | POA: Diagnosis not present

## 2023-08-13 DIAGNOSIS — D630 Anemia in neoplastic disease: Secondary | ICD-10-CM | POA: Diagnosis present

## 2023-08-13 DIAGNOSIS — R188 Other ascites: Secondary | ICD-10-CM | POA: Diagnosis present

## 2023-08-13 DIAGNOSIS — R54 Age-related physical debility: Secondary | ICD-10-CM | POA: Diagnosis present

## 2023-08-13 DIAGNOSIS — K766 Portal hypertension: Secondary | ICD-10-CM | POA: Diagnosis present

## 2023-08-13 DIAGNOSIS — Z800 Family history of malignant neoplasm of digestive organs: Secondary | ICD-10-CM

## 2023-08-13 DIAGNOSIS — Z79891 Long term (current) use of opiate analgesic: Secondary | ICD-10-CM

## 2023-08-13 DIAGNOSIS — Z681 Body mass index (BMI) 19 or less, adult: Secondary | ICD-10-CM

## 2023-08-13 DIAGNOSIS — K7682 Hepatic encephalopathy: Secondary | ICD-10-CM | POA: Diagnosis present

## 2023-08-13 DIAGNOSIS — I8510 Secondary esophageal varices without bleeding: Secondary | ICD-10-CM | POA: Diagnosis present

## 2023-08-13 DIAGNOSIS — K7469 Other cirrhosis of liver: Secondary | ICD-10-CM | POA: Diagnosis present

## 2023-08-13 DIAGNOSIS — Z8249 Family history of ischemic heart disease and other diseases of the circulatory system: Secondary | ICD-10-CM

## 2023-08-13 LAB — GLUCOSE, CAPILLARY: Glucose-Capillary: 338 mg/dL — ABNORMAL HIGH (ref 70–99)

## 2023-08-13 MED ORDER — INSULIN ASPART 100 UNIT/ML IJ SOLN
0.0000 [IU] | Freq: Every day | INTRAMUSCULAR | Status: DC
  Administered 2023-08-13: 4 [IU] via SUBCUTANEOUS
  Administered 2023-08-14: 2 [IU] via SUBCUTANEOUS
  Filled 2023-08-13: qty 0.05

## 2023-08-13 MED ORDER — PROMETHAZINE HCL 25 MG RE SUPP: 25.0000 mg | Freq: Four times a day (QID) | RECTAL | Status: DC | PRN

## 2023-08-13 MED ORDER — INSULIN ASPART 100 UNIT/ML IJ SOLN
0.0000 [IU] | Freq: Three times a day (TID) | INTRAMUSCULAR | Status: DC
Start: 2023-08-14 — End: 2023-08-15
  Administered 2023-08-14: 5 [IU] via SUBCUTANEOUS
  Administered 2023-08-14: 7 [IU] via SUBCUTANEOUS
  Administered 2023-08-15 (×2): 2 [IU] via SUBCUTANEOUS
  Filled 2023-08-13: qty 0.09

## 2023-08-13 MED ORDER — ALBUMIN HUMAN 25 % IV SOLN: 50.0000 g | Freq: Once | INTRAVENOUS | Status: DC

## 2023-08-13 MED ORDER — MORPHINE SULFATE (CONCENTRATE) 10 MG /0.5 ML PO SOLN
5.0000 mg | ORAL | Status: DC | PRN
  Administered 2023-08-14 (×3): 5 mg via ORAL
  Filled 2023-08-13 (×3): qty 0.5

## 2023-08-13 MED ORDER — PROMETHAZINE HCL 25 MG PO TABS: 25.0000 mg | ORAL_TABLET | Freq: Four times a day (QID) | ORAL | Status: DC | PRN

## 2023-08-13 MED ORDER — LACTULOSE 10 GM/15ML PO SOLN
10.0000 g | Freq: Every day | ORAL | Status: DC
  Administered 2023-08-13: 10 g via ORAL
  Filled 2023-08-13 (×2): qty 15

## 2023-08-13 MED ORDER — PROCHLORPERAZINE EDISYLATE 10 MG/2ML IJ SOLN
10.0000 mg | Freq: Four times a day (QID) | INTRAMUSCULAR | Status: DC | PRN
  Administered 2023-08-13: 10 mg via INTRAVENOUS
  Filled 2023-08-13: qty 2

## 2023-08-13 MED ORDER — PROMETHAZINE (PHENERGAN) 6.25MG IN NS 50ML IVPB
6.2500 mg | Freq: Four times a day (QID) | INTRAVENOUS | Status: DC | PRN
  Filled 2023-08-13 (×2): qty 50
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
## 2023-08-13 NOTE — ED Provider Notes (Signed)
 [place] EMERGENCY DEPARTMENT AT [facility] Provider Note   CSN: [id number] Arrival date & time: 08/13/23  1508     History  Chief Complaint  Patient presents with   Abdominal Pain    Troy [name] is a 68 y.o. male with history of stage IV hepatocellular carcinoma, hepatic cirrhosis with esophageal varices, chronic arctic cytopenia, chronic portal vein thrombosis, history of recurrent GI bleed, insulin-dependent diabetes, presented to ED with abdominal distention.  Patient has a history malignant ascites for which she underwent paracentesis most recently on March 24, approximately 10 days ago while in the hospital.  He was discharged to hospice at home.  He returns because his abdomen feels distended and uncomfortable, and his wife at bedside reports that they have a paracentesis scheduled next Monday but did not feel he can wait for that.  On his most recent paracentesis 3.3 L of fluid were removed.  The patient is not anticoagulated due to high bleeding risk.  HPI     Home Medications Prior to Admission medications   Medication Sig Start Date End Date Taking? Authorizing Provider  famotidine (PEPCID) 20 MG tablet Take 1 tablet (20 mg total) by mouth daily. 08/05/23   Amin, Ankit C, MD  HYDROcodone-acetaminophen (NORCO/VICODIN) 5-325 MG tablet Take 1 tablet by mouth every 6 (six) hours as needed for severe pain (pain score 7-10) or moderate pain (pain score 4-6). 08/05/23   Amin, Ankit C, MD  ondansetron (ZOFRAN-ODT) 4 MG disintegrating tablet Take 1 tablet (4 mg total) by mouth every 8 (eight) hours as needed. 08/05/23   Amin, Ankit C, MD  oxyCODONE (OXYCONTIN) 15 mg 12 hr tablet Take 1 tablet (15 mg total) by mouth every 12 (twelve) hours. 08/05/23   Amin, Ankit C, MD  pantoprazole (PROTONIX) 40 MG tablet Take 1 tablet (40 mg total) by mouth 2 (two) times daily before a meal. 08/05/23   Amin, Ankit C, MD  polyethylene glycol powder (GLYCOLAX/MIRALAX) 17 GM/SCOOP powder  Take 17 g by mouth daily. 08/05/23   Miguel Rota, MD  prochlorperazine (COMPAZINE) 10 MG tablet Take 1 tablet (10 mg total) by mouth every 6 (six) hours as needed for nausea or vomiting. 08/05/23   Nelson Chimes, Ankit C, MD  promethazine (PHENERGAN) 25 MG tablet Take 1 tablet (25 mg total) by mouth every 6 (six) hours as needed for refractory nausea / vomiting. 08/05/23   Amin, Ankit C, MD  senna-docusate (SENOKOT-S) 8.6-50 MG tablet Take 1 tablet by mouth 2 (two) times daily. 08/05/23   Amin, Ankit C, MD  sucralfate (CARAFATE) 1 g tablet Take 1 tablet (1 g total) by mouth 2 (two) times daily for 10 days. 08/05/23 08/15/23  Miguel Rota, MD      Allergies    Patient has no known allergies.    Review of Systems   Review of Systems  Physical Exam Updated Vital Signs BP 127/75 (BP Location: Left Arm)   Pulse (!) 114   Temp 97.7 F (36.5 C) (Oral)   Resp 18   Ht 5\' 10"  (1.778 m)   Wt 58.6 kg   SpO2 100%   BMI 18.54 kg/m  Physical Exam Constitutional:      Comments: Thin, cachectic appearing  HENT:     Head: Normocephalic and atraumatic.  Eyes:     Conjunctiva/sclera: Conjunctivae normal.     Pupils: Pupils are equal, round, and reactive to light.  Cardiovascular:     Rate and Rhythm: Regular rhythm. Tachycardia present.  Pulmonary:     Effort: Pulmonary effort is normal. No respiratory distress.  Abdominal:     General: There is distension.     Tenderness: There is no abdominal tenderness.  Skin:    General: Skin is warm and dry.  Neurological:     General: No focal deficit present.     Mental Status: He is alert. Mental status is at baseline.  Psychiatric:        Mood and Affect: Mood normal.        Behavior: Behavior normal.     ED Results / Procedures / Treatments   Labs (all labs ordered are listed, but only abnormal results are displayed) Labs Reviewed - No data to display  EKG None  Radiology No results found.  Procedures Procedures    Medications Ordered in  ED Medications  albumin human 25 % solution 50 g (has no administration in time range)    ED Course/ Medical Decision Making/ A&P Clinical Course as of 08/13/23 1731  Thu Aug 13, 2023  1719 Dr Buckner Malta from IR reports they are not able to perform the paracentesis until tomorrow.  Plan to admit patient overnight. [MT]  1731 Admitted to hospitalist [MT]    Clinical Course User Index [MT] Briauna Gilmartin, Kermit Balo, MD                                 Medical Decision Making Risk Prescription drug management. Decision regarding hospitalization.   Patient is presenting to the ED for therapeutic paracentesis due to abdominal discomfort and shortness of breath.  He is on home hospice and they are not requesting more aggressive care.  I reviewed his external records including most recent hospitalization course, 2 weeks ago in March.  He does have fluid on abdominal exam, but no rigidity or guarding to raise concern for SBP.  He is mildly tachycardic, otherwise stable vital signs.  I will reach out to the IR department to see if they are still able to perform the paracentesis today.  If not the patient would likely need to be hospitalized overnight of paracentesis done.        Final Clinical Impression(s) / ED Diagnoses Final diagnoses:  Malignant ascites    Rx / DC Orders ED Discharge Orders     None         Terald Sleeper, MD 08/13/23 1731

## 2023-08-13 NOTE — ED Triage Notes (Signed)
 Pt arrived from EMS. Hospice patient. Patient sent over for evaluation for paracentesis. Patient has one scheduled for next week. However, ascites worse, uncomfortable and cannot wait until then. Denies shob, dizziness, cp or any other symptoms at this time. Pt AAOX4

## 2023-08-13 NOTE — Progress Notes (Signed)
   This pt is active under hospice services with our agency. 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   Our nurse made home visit today and found pt oriented to person and place. He was lethargic but did respond to the nurse at times. He complained of increased pain in his abdomen and the wife was asking for [place] to schedule an earlier appointment for the pt to get paracentesis for comfort. We did call all 5 companies that we contract with to see if we could get a paracentesis scheduled and the earliest appointment we could get was for Monday. The wife and pt felt that the pt could not wait that long and preferred that he come to the hospital for a paracentesis to be completed.   This is a related hospitalization and we will continue to follow and assist with any needs.  Pt has in his home ativan 0.5mg  tablets which he takes as needed for anxiety, morphine concentrate 0.14ml (20mg /17ml) as needed for SOB., haloperidol 1mg  tab for agitation or hallucinations as needed, compazine 10mg  tab as needed for N/V, and hydrocodone 3/325 for break through pain prn with morphine ER 15mg  BID.   Thank you Mignon Pine RN [phone]

## 2023-08-13 NOTE — Telephone Encounter (Signed)
-----   Message from Arn Medal sent at 08/13/2023  5:21 PM EDT ----- Regarding: RE: Pleurx catheter placement Ok to schedule Pleurx.  Henn ----- Message ----- From: Sharee Pimple Sent: 08/13/2023   2:23 PM EDT To: Ir Procedure Requests Subject: Pleurx catheter placement                      Procedure: abdominal pleurx catheter placement  Dx: Malignant ascites  Ordering: Dr. Romie Minus - [facility] [phone]  Imaging: In epic  Please review.   Thanks,  Fara Boros

## 2023-08-14 ENCOUNTER — Observation Stay (HOSPITAL_COMMUNITY)

## 2023-08-14 ENCOUNTER — Encounter: Payer: Self-pay | Admitting: Hematology

## 2023-08-14 DIAGNOSIS — Z800 Family history of malignant neoplasm of digestive organs: Secondary | ICD-10-CM | POA: Diagnosis not present

## 2023-08-14 DIAGNOSIS — K766 Portal hypertension: Secondary | ICD-10-CM | POA: Diagnosis present

## 2023-08-14 DIAGNOSIS — K3189 Other diseases of stomach and duodenum: Secondary | ICD-10-CM | POA: Diagnosis present

## 2023-08-14 DIAGNOSIS — Z66 Do not resuscitate: Secondary | ICD-10-CM | POA: Diagnosis present

## 2023-08-14 DIAGNOSIS — K7682 Hepatic encephalopathy: Secondary | ICD-10-CM | POA: Diagnosis present

## 2023-08-14 DIAGNOSIS — Z8249 Family history of ischemic heart disease and other diseases of the circulatory system: Secondary | ICD-10-CM | POA: Diagnosis not present

## 2023-08-14 DIAGNOSIS — N179 Acute kidney failure, unspecified: Secondary | ICD-10-CM | POA: Diagnosis present

## 2023-08-14 DIAGNOSIS — C220 Liver cell carcinoma: Secondary | ICD-10-CM | POA: Diagnosis present

## 2023-08-14 DIAGNOSIS — I10 Essential (primary) hypertension: Secondary | ICD-10-CM | POA: Diagnosis present

## 2023-08-14 DIAGNOSIS — Z515 Encounter for palliative care: Secondary | ICD-10-CM | POA: Diagnosis not present

## 2023-08-14 DIAGNOSIS — E119 Type 2 diabetes mellitus without complications: Secondary | ICD-10-CM | POA: Diagnosis present

## 2023-08-14 DIAGNOSIS — Z79891 Long term (current) use of opiate analgesic: Secondary | ICD-10-CM | POA: Diagnosis not present

## 2023-08-14 DIAGNOSIS — Z794 Long term (current) use of insulin: Secondary | ICD-10-CM | POA: Diagnosis not present

## 2023-08-14 DIAGNOSIS — K7469 Other cirrhosis of liver: Secondary | ICD-10-CM | POA: Diagnosis present

## 2023-08-14 DIAGNOSIS — Z681 Body mass index (BMI) 19 or less, adult: Secondary | ICD-10-CM | POA: Diagnosis not present

## 2023-08-14 DIAGNOSIS — D630 Anemia in neoplastic disease: Secondary | ICD-10-CM | POA: Diagnosis present

## 2023-08-14 DIAGNOSIS — R64 Cachexia: Secondary | ICD-10-CM | POA: Diagnosis present

## 2023-08-14 DIAGNOSIS — E875 Hyperkalemia: Secondary | ICD-10-CM | POA: Diagnosis present

## 2023-08-14 DIAGNOSIS — R188 Other ascites: Secondary | ICD-10-CM | POA: Diagnosis not present

## 2023-08-14 DIAGNOSIS — Z79899 Other long term (current) drug therapy: Secondary | ICD-10-CM | POA: Diagnosis not present

## 2023-08-14 DIAGNOSIS — I81 Portal vein thrombosis: Secondary | ICD-10-CM | POA: Diagnosis present

## 2023-08-14 DIAGNOSIS — R180 Malignant ascites: Secondary | ICD-10-CM | POA: Diagnosis present

## 2023-08-14 DIAGNOSIS — E7800 Pure hypercholesterolemia, unspecified: Secondary | ICD-10-CM | POA: Diagnosis present

## 2023-08-14 DIAGNOSIS — R54 Age-related physical debility: Secondary | ICD-10-CM | POA: Diagnosis present

## 2023-08-14 DIAGNOSIS — I8510 Secondary esophageal varices without bleeding: Secondary | ICD-10-CM | POA: Diagnosis present

## 2023-08-14 HISTORY — PX: IR IMAGE GUIDED DRAINAGE PERCUT CATH  PERITONEAL RETROPERIT: IMG5467

## 2023-08-14 LAB — BASIC METABOLIC PANEL WITH GFR
Anion gap: 13 (ref 5–15)
BUN: 71 mg/dL — ABNORMAL HIGH (ref 8–23)
CO2: 15 mmol/L — ABNORMAL LOW (ref 22–32)
Calcium: 9 mg/dL (ref 8.9–10.3)
Chloride: 103 mmol/L (ref 98–111)
Creatinine, Ser: 5.33 mg/dL — ABNORMAL HIGH (ref 0.61–1.24)
GFR, Estimated: 11 mL/min — ABNORMAL LOW (ref >60)
Glucose, Bld: 295 mg/dL — ABNORMAL HIGH (ref 70–99)
Potassium: 6.6 mmol/L (ref 3.5–5.1)
Sodium: 131 mmol/L — ABNORMAL LOW (ref 135–145)

## 2023-08-14 LAB — CBC
HCT: 32.9 % — ABNORMAL LOW (ref 39.0–52.0)
Hemoglobin: 10.3 g/dL — ABNORMAL LOW (ref 13.0–17.0)
MCH: 31.3 pg (ref 26.0–34.0)
MCHC: 31.3 g/dL (ref 30.0–36.0)
MCV: 100 fL (ref 80.0–100.0)
Platelets: 89 10*3/uL — ABNORMAL LOW (ref 150–400)
RBC: 3.29 MIL/uL — ABNORMAL LOW (ref 4.22–5.81)
RDW: 18.9 % — ABNORMAL HIGH (ref 11.5–15.5)
WBC: 3.8 10*3/uL — ABNORMAL LOW (ref 4.0–10.5)
nRBC: 4.2 % — ABNORMAL HIGH (ref 0.0–0.2)

## 2023-08-14 LAB — GLUCOSE, CAPILLARY
Glucose-Capillary: 274 mg/dL — ABNORMAL HIGH (ref 70–99)
Glucose-Capillary: 303 mg/dL — ABNORMAL HIGH (ref 70–99)
Glucose-Capillary: 242 mg/dL — ABNORMAL HIGH (ref 70–99)

## 2023-08-14 MED ORDER — SODIUM CHLORIDE 0.9 % IV SOLN
INTRAVENOUS | Status: AC
Start: 2023-08-14 — End: 2023-08-15

## 2023-08-14 MED ORDER — MIDAZOLAM HCL 2 MG/2ML IJ SOLN
INTRAMUSCULAR | Status: AC | PRN
  Administered 2023-08-14: .5 mg via INTRAVENOUS

## 2023-08-14 MED ORDER — CEFAZOLIN SODIUM-DEXTROSE 2-4 GM/100ML-% IV SOLN
INTRAVENOUS | Status: AC
  Filled 2023-08-14: qty 100

## 2023-08-14 MED ORDER — FENTANYL CITRATE (PF) 100 MCG/2ML IJ SOLN
INTRAMUSCULAR | Status: AC
  Filled 2023-08-14: qty 2

## 2023-08-14 MED ORDER — ALBUMIN HUMAN 25 % IV SOLN
25.0000 g | Freq: Four times a day (QID) | INTRAVENOUS | Status: AC
  Administered 2023-08-14 (×2): 25 g via INTRAVENOUS
  Filled 2023-08-14 (×2): qty 100

## 2023-08-14 MED ORDER — MIDAZOLAM HCL 2 MG/2ML IJ SOLN
INTRAMUSCULAR | Status: AC
  Filled 2023-08-14: qty 2

## 2023-08-14 MED ORDER — CEFAZOLIN SODIUM-DEXTROSE 2-4 GM/100ML-% IV SOLN
2.0000 g | INTRAVENOUS | Status: AC
  Administered 2023-08-14: 2 g via INTRAVENOUS

## 2023-08-14 MED ORDER — SODIUM ZIRCONIUM CYCLOSILICATE 10 G PO PACK
10.0000 g | PACK | Freq: Two times a day (BID) | ORAL | Status: DC
  Filled 2023-08-14: qty 1

## 2023-08-14 MED ORDER — MORPHINE SULFATE (PF) 2 MG/ML IV SOLN: 2.0000 mg | INTRAVENOUS | Status: DC | PRN

## 2023-08-14 MED ORDER — LIDOCAINE HCL 1 % IJ SOLN
20.0000 mL | Freq: Once | INTRAMUSCULAR | Status: AC
  Administered 2023-08-14: 10 mL via INTRADERMAL

## 2023-08-14 MED ORDER — LIDOCAINE HCL 1 % IJ SOLN
INTRAMUSCULAR | Status: AC
  Filled 2023-08-14: qty 20

## 2023-08-14 NOTE — Sedation Documentation (Signed)
 4.5 L fluid removed via abdominal Pleurx catheter.

## 2023-08-14 NOTE — Progress Notes (Addendum)
 CRITICAL VALUE STICKER  CRITICAL VALUE: Potassium 6.6  RECEIVER (on-site recipient of call): Almira Bar LPN  DATE & TIME NOTIFIED:08/14/2023 at [facility] (representative from lab): Victorino Dike  MD NOTIFIED: Skeet Simmer MD  TIME OF NOTIFICATION: 1820  RESPONSE: Lokelma ordered

## 2023-08-14 NOTE — Procedures (Signed)
 Interventional Radiology Procedure Note  Procedure: Tunneled peritoneal PleurX catheter placement  Complications: None  Estimated Blood Loss: < 10 mL  Findings: RLQ PleurX catheter placed. Currently performing large volume paracentesis. See final procedure report.  Jodi Marble. Fredia Sorrow, M.D Pager:  [phone]

## 2023-08-14 NOTE — Care Management Obs Status (Signed)
 MEDICARE OBSERVATION STATUS NOTIFICATION   Patient Details  Name: Troy [name] MRN: [id number] Date of Birth: [birth year]   Medicare Observation Status Notification Given:  Yes    Otelia Santee, LCSW 08/14/2023, 3:20 PM

## 2023-08-14 NOTE — TOC Initial Note (Signed)
 Transition of Care [facility]) - Initial/Assessment Note    Patient Details  Name: Troy [name] MRN: [id number] Date of Birth: [birth year]  [facility]) CM/SW Contact:    Troy Santee, LCSW Phone Number: 08/14/2023, 3:22 PM  Clinical Narrative:                 Pt from home with spouse. Pt is active with [facility] for in home hospice services. Pt has BSC, wheelchair, and transfer chair at home. Pt will need PTAR for transportation at discharge.   Expected Discharge Plan: Home w Hospice Care Barriers to Discharge: No Barriers Identified   Patient Goals and CMS Choice Patient states their goals for this hospitalization and ongoing recovery are:: For pt to return home with hospice CMS Medicare.gov Compare Post Acute Care list provided to:: Patient Represenative (must comment) Choice offered to / list presented to : Spouse Troy [name] ownership interest in [facility].provided to::  (NA)    Expected Discharge Plan and Services In-house Referral: Clinical Social Work Discharge Planning Services: NA Post Acute Care Choice: Resumption of Svcs/PTA Provider, Hospice Living arrangements for the past 2 months: Single Family Home                 DME Arranged: N/A                    Prior Living Arrangements/Services Living arrangements for the past 2 months: Single Family Home Lives with:: Spouse Patient language and need for interpreter reviewed:: Yes Do you feel safe going back to the place where you live?: Yes      Need for Family Participation in Patient Care: Yes (Comment) Care giver support system in place?: Yes (comment) Current home services: DME, Hospice (BSC, transport chair. [facility]) Criminal Activity/Legal Involvement Pertinent to Current Situation/Hospitalization: No - Comment as needed  Activities of Daily Living   ADL Screening (condition at time of admission) Independently performs ADLs?: No Does the patient  have a NEW difficulty with bathing/dressing/toileting/self-feeding that is expected to last >3 days?: No Does the patient have a NEW difficulty with getting in/out of bed, walking, or climbing stairs that is expected to last >3 days?: No Does the patient have a NEW difficulty with communication that is expected to last >3 days?: No Is the patient deaf or have difficulty hearing?: No Does the patient have difficulty seeing, even when wearing glasses/contacts?: No Does the patient have difficulty concentrating, remembering, or making decisions?: Yes  Permission Sought/Granted Permission sought to share information with : Family Supports, Oceanographer granted to share information with : Yes, Verbal Permission Granted  Share Information with NAME: Troy [name]  Permission granted to share info w AGENCY: [facility]        Emotional Assessment Appearance:: Appears older than stated age Attitude/Demeanor/Rapport: Unable to Assess Affect (typically observed): Unable to Assess Orientation: : Oriented to Self, Oriented to Place, Oriented to  Time, Oriented to Situation Alcohol / Substance Use: Not Applicable Psych Involvement: No (comment)  Admission diagnosis:  Malignant ascites [R18.0] Ascites [R18.8] Patient Active Problem List   Diagnosis Date Noted   Ascites 08/13/2023   Encounter for palliative care 08/03/2023   Abdominal pain 07/28/2023   Failure to thrive in adult 07/28/2023   Chronic idiopathic thrombocytopenia (HCC) 07/28/2023   Chronic anemia 07/28/2023   Candidemia (HCC) 06/18/2023   Fever 06/17/2023   History of hepatocellular carcinoma 06/16/2023  Infection due to Stenotrophomonas maltophilia 06/16/2023   Bacteremia due to Klebsiella pneumoniae 06/15/2023   Port or reservoir infection 06/15/2023   SIRS (systemic inflammatory response syndrome) (HCC) 06/11/2023   Cancer related pain 06/11/2023   Hyponatremia 06/11/2023   Severe  sepsis (HCC) 04/21/2023   Lt inguinal pain 03/12/2023   Thrombus 03/11/2023   Hepatic encephalopathy (HCC) 11/22/2022   Acute hepatic encephalopathy (HCC) 11/21/2022   Hematuria 10/17/2022   Protein-calorie malnutrition, severe 10/17/2022   Acute GI bleeding 10/16/2022   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   Melena 09/10/2022   ABLA (acute blood loss anemia) 09/10/2022   Dehydration 04/22/2022   Tachycardia 04/22/2022   Hypercalcemia 04/22/2022   Thrombocytopenia (HCC) 04/22/2022   Decreased range of motion of neck 04/02/2022   Neck pain 04/02/2022   Nausea & vomiting 12/18/2021   Hepatocellular carcinoma (HCC) 11/23/2021   Portal hypertensive gastropathy (HCC)    Secondary esophageal varices without bleeding (HCC)    Esophageal candidiasis (HCC)    Malnutrition of moderate degree 11/01/2021   Liver mass    Gastro-esophageal reflux disease without esophagitis 10/31/2021   Insulin dependent type 2 diabetes mellitus (HCC) 10/31/2021   Portal vein thrombosis with liver mass concerning for malignancy  10/31/2021   Cocaine abuse, uncomplicated (HCC) 01/21/2021   Stage 3b chronic kidney disease (HCC) 11/06/2020   Chronic low back pain 03/06/2020   Hyperlipidemia 03/06/2020   Essential hypertension 03/06/2020   Acute on chronic blood loss anemia 05/13/2019   Hepatic cirrhosis due to chronic hepatitis C infection (HCC) 05/12/2008   PCP:  Troy Mood, MD Pharmacy:   [facility] - [place], [place] - [address] [place] [address] Phone: [phone] Fax: [phone]  Troy [name] - [facility] [address] [place] [address] Phone: [phone] Fax: [phone]     Social Drivers of Health (SDOH) Social History: SDOH Screenings   Food Insecurity: No Food Insecurity (08/14/2023)  Housing: Low Risk  (08/14/2023)  Transportation Needs: No Transportation Needs  (08/14/2023)  Utilities: Not At Risk (08/14/2023)  Social Connections: Socially Integrated (08/14/2023)  Tobacco Use: Medium Risk (08/13/2023)   SDOH Interventions:     Readmission Risk Interventions    07/31/2023    9:36 AM  Readmission Risk Prevention Plan  Transportation Screening Complete  Medication Review (RN Care Manager) Complete  HRI or Home Care Consult Complete  SW Recovery Care/Counseling Consult Complete  Palliative Care Screening Complete  Skilled Nursing Facility Not Applicable
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
## 2023-08-14 NOTE — Plan of Care (Signed)

## 2023-08-14 NOTE — Plan of Care (Signed)
 Pt admitted to unit this shift. Pt is comfort measures with plan to assess and to possibly place drain for abdominal ascites. Pt's wife at bedside.  Problem: Education: Goal: Ability to describe self-care measures that may prevent or decrease complications (Diabetes Survival Skills Education) will improve Outcome: Progressing Goal: Individualized Educational Video(s) Outcome: Progressing   Problem: Coping: Goal: Ability to adjust to condition or change in health will improve 08/14/2023 0513 by Garwin Brothers, RN Outcome: Progressing 08/14/2023 0513 by Garwin Brothers, RN Outcome: Progressing   Problem: Fluid Volume: Goal: Ability to maintain a balanced intake and output will improve Outcome: Progressing   Problem: Health Behavior/Discharge Planning: Goal: Ability to identify and utilize available resources and services will improve Outcome: Progressing Goal: Ability to manage health-related needs will improve Outcome: Progressing   Problem: Metabolic: Goal: Ability to maintain appropriate glucose levels will improve Outcome: Progressing   Problem: Nutritional: Goal: Maintenance of adequate nutrition will improve Outcome: Progressing Goal: Progress toward achieving an optimal weight will improve Outcome: Progressing   Problem: Skin Integrity: Goal: Risk for impaired skin integrity will decrease Outcome: Progressing   Problem: Tissue Perfusion: Goal: Adequacy of tissue perfusion will improve Outcome: Progressing   Problem: Education: Goal: Knowledge of General Education information will improve Description: Including pain rating scale, medication(s)/side effects and non-pharmacologic comfort measures Outcome: Progressing   Problem: Health Behavior/Discharge Planning: Goal: Ability to manage health-related needs will improve Outcome: Progressing   Problem: Clinical Measurements: Goal: Ability to maintain clinical measurements within normal limits will improve Outcome:  Progressing Goal: Will remain free from infection Outcome: Progressing Goal: Diagnostic test results will improve Outcome: Progressing Goal: Respiratory complications will improve Outcome: Progressing Goal: Cardiovascular complication will be avoided Outcome: Progressing   Problem: Activity: Goal: Risk for activity intolerance will decrease Outcome: Progressing   Problem: Nutrition: Goal: Adequate nutrition will be maintained Outcome: Progressing   Problem: Coping: Goal: Level of anxiety will decrease 08/14/2023 0513 by Garwin Brothers, RN Outcome: Progressing 08/14/2023 0513 by Garwin Brothers, RN Outcome: Progressing   Problem: Elimination: Goal: Will not experience complications related to bowel motility Outcome: Progressing Goal: Will not experience complications related to urinary retention Outcome: Progressing   Problem: Pain Managment: Goal: General experience of comfort will improve and/or be controlled Outcome: Progressing   Problem: Safety: Goal: Ability to remain free from injury will improve 08/14/2023 0513 by Garwin Brothers, RN Outcome: Progressing 08/14/2023 0513 by Garwin Brothers, RN Outcome: Progressing   Problem: Skin Integrity: Goal: Risk for impaired skin integrity will decrease 08/14/2023 0513 by Garwin Brothers, RN Outcome: Progressing 08/14/2023 0513 by Garwin Brothers, RN Outcome: Progressing

## 2023-08-14 NOTE — Progress Notes (Addendum)
 PROGRESS NOTE    Troy [name]  GEX:[id number] DOB: [birth year] DOA: 08/13/2023 PCP: [name], MD  Chief Complaint  Patient presents with   Abdominal Pain    Brief Narrative:   Troy [name] is Troy [name] 68 y.o. male with medical history significant of metastatic hepatocellular carcinoma, cirrhosis, and multiple other medical issues recently enrolled in hospice for comfort measures after his last admission (discharged on 3/26) here with progressive abdominal discomfort and ascites.    Assessment & Plan:   Principal Problem:   Ascites  Addendum: called wife to follow up labs.  No answer.  Left message, will try to call back again before I leave.  He remains appropriate for comfort care, likely inpatient hospice vs remaining in hospital pending his course.  Addendum, his wife feels comfortable caring for him at home with hospice.  Will try to get him home with hospice 4/5 am as along as he looks appropriate to transfer.   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  He's returned with progressive ascites.  It looks like Hospice or [facility] was attempting to arrange an abdominal pleurx catheter for him outpatient. S/p 4.5 L removed.  Placement of tunneled peritoneal drainage catheter.  Clinically remains lethargic today, I think he's likely appropriate for inpatient hospice.  Discussed with his wife.  She'd like to follow labs/trial IVF - discussed focus on comfort and my concern that he's declining and that our interventions unlikely to change outcome (also, the uncertainty that can follow regarding what we do with the lab results) - for now, will check labs and continue IVF/albumin. Will continue to discuss.   AKI  Hyperkalemia Will discuss with his wife, overall goal has been comfort as noted above  Lethargy Likely related to above.   Possible hepatic encephalopathy.  Goal is comfort, will start lactulose as his bowel regimen (won't be aggressive given goal for comfort).    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 Not on anticoaguation given hospice status, bleeding risk   Anemia of Chronic Disease Noted   Poor Urine Output Possible progressive renal failure in setting of poor PO intake Goal is comfort as noted above - discussed option of labs, but we've decided to defer them as they won't change our management/goal for comfort   T2DM SSI (discussed option of holding this, but his wife notes he gets symptomatic - like headache - with hyperglycemia)     DVT prophylaxis: comfort Code Status: DNR Family Communication: wife Disposition:   Status is: Observation The patient will require care spanning > 2 midnights and should be moved to inpatient because: need for ongoing inpatient care   Consultants:  IR  Procedures:  IMPRESSION: Placement of tunneled peritoneal drainage catheter via right abdominal approach. 4.5 liters of ascites was removed today after catheter placement.  Antimicrobials:  Anti-infectives (From admission, onward)    Start     Dose/Rate Route Frequency Ordered Stop   08/14/23 1200  ceFAZolin (ANCEF) IVPB 2g/100 mL premix        2 g 200 mL/hr over 30 Minutes Intravenous On call 08/14/23 1103 08/14/23 1316       Subjective: Lethargic Long discussion with wife  Objective: Vitals:   08/14/23 1200 08/14/23 1205 08/14/23 1210 08/14/23 1215  BP: 104/70 102/66 95/62 98/61   Pulse: (!) 110  (!) 109   Resp: 10 (!) 9 11 10   Temp:  TempSrc:      SpO2: 100%  100%   Weight:      Height:       No intake or output data in the 24 hours ending 08/14/23 1843 Filed Weights   08/13/23 1525  Weight: 58.6 kg    Examination:  General exam: frail, cachetic  Respiratory system: unlabored Cardiovascular system: RRR Gastrointestinal system: distended,  catheter in place Central nervous system: lethargic Extremities: no LEE  Data Reviewed: I have personally reviewed following labs and imaging studies  CBC: Recent Labs  Lab 08/14/23 1715  WBC 3.8*  HGB 10.3*  HCT 32.9*  MCV 100.0  PLT 89*    Basic Metabolic Panel: Recent Labs  Lab 08/14/23 1715  NA 131*  K 6.6*  CL 103  CO2 15*  GLUCOSE 295*  BUN 71*  CREATININE 5.33*  CALCIUM 9.0    GFR: Estimated Creatinine Clearance: 10.8 mL/min (Troy [name]) (by C-G formula based on SCr of 5.33 mg/dL (H)).  Liver Function Tests: No results for input(s): "AST", "ALT", "ALKPHOS", "BILITOT", "PROT", "ALBUMIN" in the last 168 hours.  CBG: Recent Labs  Lab 08/13/23 2121 08/14/23 0807 08/14/23 1803  GLUCAP 338* 274* 303*     No results found for this or any previous visit (from the past 240 hours).       Radiology Studies: IR IMAGE GUIDED DRAINAGE PERCUT CATH  PERITONEAL RETROPERIT Result Date: 08/14/2023 CLINICAL DATA:  Refractory malignant ascites secondary to metastatic hepatocellular carcinoma. Request for tunneled peritoneal drainage catheter for palliative drainage of ascites. EXAM: INSERTION OF TUNNELED PERITONEAL DRAINAGE CATHETER ANESTHESIA/SEDATION: 0.5 mg IV Versed was administered and the patient did not receive formal moderate conscious sedation. MEDICATIONS: 2 g IV Ancef. Antibiotic was administered in an appropriate time interval for the procedure. FLUOROSCOPY: 18 seconds.  1.0 mGy. PROCEDURE: The procedure, risks, benefits, and alternatives were explained to the patient's wife. Questions regarding the procedure were encouraged and answered. The patient's wife understands and consents to the procedure. Troy [name] time-out was performed prior to initiating the procedure The right abdominal wall was prepped with chlorhexidine in Troy [name] sterile fashion, and Troy [name] sterile drape was applied covering the operative field. Brettney Ficken sterile gown and sterile gloves were used for the procedure. Local anesthesia  was provided with 1% Lidocaine. Ultrasound image documentation was performed. Fluoroscopy during the procedure and fluoroscopic spot radiograph confirms appropriate catheter position. After creating Fujie Dickison small skin incision, Hedaya Latendresse 19 gauge needle was advanced into the peritoneal cavity under ultrasound guidance. Avyukt Cimo guide wire was then advanced under fluoroscopy into the peritoneal cavity. Peritoneal access was dilated serially and Rilley Poulter 16-French peel-away sheath placed. Keyonni Percival 15.5 French tunneled PleurX catheter was placed. This was tunneled from an incision 5 cm below the peritoneal access to the access site. The catheter was advanced through the peel-away sheath. The sheath was then removed. Final catheter positioning was confirmed with Lorelee Mclaurin fluoroscopic spot image. The peritoneal access incision was closed with subcuticular 4-0 Vicryl. Dermabond was applied to the incision. Ketzia Guzek Prolene retention suture was applied at the catheter exit site. Large volume paracentesis was performed through the new catheter utilizing drainage bottles. COMPLICATIONS: None. FINDINGS: The catheter was placed via the right abdominal wall. Catheter course is superior and across the peritoneal cavity to the left upper quadrant. Approximately 4.5 liters of ascites was able to be removed after catheter placement. IMPRESSION: Placement of tunneled peritoneal drainage catheter via right abdominal approach. 4.5 liters of ascites was removed today after catheter placement. Electronically Signed   By:  Irish Lack M.D.   On: 08/14/2023 15:55        Scheduled Meds:  insulin aspart  0-5 Units Subcutaneous QHS   insulin aspart  0-9 Units Subcutaneous TID WC   lactulose  10 g Oral Daily   sodium zirconium cyclosilicate  10 g Oral BID   Continuous Infusions:  sodium chloride 75 mL/hr at 08/14/23 1611   albumin human 25 g (08/14/23 1819)   promethazine (PHENERGAN) injection (IM or IVPB)       LOS: 0 days    Time spent: over 30 min    Lacretia Nicks, MD [facility]   To contact the attending provider between 7A-7P or the covering provider during after hours 7P-7A, please log into the web site [email or web address] and access using universal [place] password for that web site. If you do not have the password, please call the hospital operator.  08/14/2023, 6:43 PM

## 2023-08-15 ENCOUNTER — Encounter: Payer: Self-pay | Admitting: Hematology

## 2023-08-15 ENCOUNTER — Other Ambulatory Visit (HOSPITAL_COMMUNITY): Payer: Self-pay

## 2023-08-15 DIAGNOSIS — R188 Other ascites: Secondary | ICD-10-CM | POA: Diagnosis not present

## 2023-08-15 LAB — GLUCOSE, CAPILLARY
Glucose-Capillary: 186 mg/dL — ABNORMAL HIGH (ref 70–99)
Glucose-Capillary: 184 mg/dL — ABNORMAL HIGH (ref 70–99)

## 2023-08-15 MED ORDER — LACTULOSE 10 GM/15ML PO SOLN
10.0000 g | Freq: Every day | ORAL | 0 refills | Status: DC
  Filled 2023-08-15: qty 236, 15d supply, fill #0

## 2023-08-15 MED ORDER — HALOPERIDOL 1 MG PO TABS: 1.0000 mg | ORAL_TABLET | ORAL | Status: DC | PRN

## 2023-08-15 MED ORDER — LORAZEPAM 1 MG PO TABS: 0.5000 mg | ORAL_TABLET | ORAL | Status: DC | PRN

## 2023-08-15 MED ORDER — MORPHINE SULFATE (CONCENTRATE) 10 MG /0.5 ML PO SOLN: 5.0000 mg | ORAL | Status: DC | PRN

## 2023-08-15 MED ORDER — MORPHINE SULFATE ER 15 MG PO TBCR: 15.0000 mg | EXTENDED_RELEASE_TABLET | Freq: Two times a day (BID) | ORAL | Status: DC

## 2023-08-15 NOTE — Discharge Summary (Signed)
 Physician Discharge Summary  Troy [name] NFA:[id number] DOB: Sep 11, 1955 DOA: 08/13/2023  PCP: [name], MD  Admit date: 08/13/2023 Discharge date: 08/15/2023  Time spent: 40 minutes  Recommendations for Outpatient Follow-up:  Follow outpatient hospice for comfort measures Use abdominal pleurx to drain ascites as needed    Discharge Diagnoses:  Principal Problem:   Ascites Active Problems:   End of life care   Discharge Condition: stable  Diet recommendation: as tolerated  Filed Weights   08/13/23 1525  Weight: 58.6 kg    History of present illness:   Troy [name] is Troy [name] 68 y.o. male with medical history significant of metastatic hepatocellular carcinoma, cirrhosis, and multiple other medical issues recently enrolled in hospice for comfort measures after his last admission (discharged on 3/26) here with progressive abdominal discomfort and ascites.   Had tunneled peritoneal drainage catheter placed with 4.5 L removed.  Received albumin and IVF after.  Stable for discharge on 4/5 back home with hospice (discussed other options like remaining inpatient vs inpatient hospice, but wife felt comfortable caring for him at home).  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  He's returned with progressive ascites.  It looks like Hospice or [facility] was attempting to arrange an abdominal pleurx catheter for him outpatient. S/p 4.5 L removed.  Placement of tunneled peritoneal drainage catheter.  Less lethargic today.  His wife's goal is to care for him at home.  She feels comfortable with this.  Will plan to discharge home with hospice.     AKI  Hyperkalemia Will discuss with his wife, overall goal has been comfort as noted above   Lethargy Likely  related to above.  Possible hepatic encephalopathy.  Goal is comfort, will start lactulose as his bowel regimen (won't be aggressive given goal for comfort).    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 Not on anticoaguation given hospice status, bleeding risk   Anemia of Chronic Disease Noted   Poor Urine Output Possible progressive renal failure in setting of poor PO intake Goal is comfort as noted above - discussed option of labs, but we've decided to defer them as they won't change our management/goal for comfort   T2DM SSI (discussed option of holding this, but his wife notes he gets symptomatic - like headache - with hyperglycemia) I recommend discontinuing insulin at discharge due to plan for comfort and risk of hypoglycemia with his renal failure/poor PO intake    Procedures:   IMPRESSION: Placement of tunneled peritoneal drainage catheter via right abdominal approach. 4.5 liters of ascites was removed today after catheter placement.  Consultations: IR  Discharge Exam: Vitals:   08/14/23 1215 08/14/23 2025  BP: 98/61 (!) 82/52  Pulse:  (!) 110  Resp: 10 18  Temp:  97.6 F (36.4 C)  SpO2:  95%   Difficult to understand, speaks quietly Asks for some water Wife at bedside We discussed plan for discharge home  General: No acute distress. Cardiovascular: RRR Lungs: unlabored Abdomen:  drain in place Neurological: lethargic - but more alert than today, moving all extremities Extremities: No clubbing or cyanosis. No edema.   Discharge Instructions   Discharge Instructions     Diet - low sodium heart healthy   Complete by: As directed    Discharge instructions   Complete by: As directed    You  were seen for worsening ascites.  You've had [name] drain placed.  You can use this to remove abdominal fluid as needed for worsening distension.  I changed [name] to lactulose for his constipation.  If he tolerates this, try this daily as  tolerated.  Otherwise, you can resume the miralax.   At this point, I think you can stop [name]'s insulin.  I think his risk of low blood sugars is high if insulin is continued while he has progressive renal failure and poor oral intake.   Our goal for [name] is for comfort.  He'll discharge back home with hospice.  Continue the medicines as previously prescribed by hospice.   Increase activity slowly   Complete by: As directed    No wound care   Complete by: As directed       Allergies as of 08/15/2023   No Known Allergies      Medication List     STOP taking these medications    oxyCODONE 15 mg 12 hr tablet Commonly known as: OXYCONTIN   polyethylene glycol powder 17 GM/SCOOP powder Commonly known as: GLYCOLAX/MIRALAX   prochlorperazine 10 MG tablet Commonly known as: COMPAZINE   senna-docusate 8.6-50 MG tablet Commonly known as: Senokot-S   sucralfate 1 g tablet Commonly known as: CARAFATE       TAKE these medications    famotidine 20 MG tablet Commonly known as: PEPCID Take 1 tablet (20 mg total) by mouth daily.   haloperidol 1 MG tablet Commonly known as: HALDOL Take 1 tablet (1 mg total) by mouth every 4 (four) hours as needed for agitation.   HYDROcodone-acetaminophen 5-325 MG tablet Commonly known as: NORCO/VICODIN Take 1 tablet by mouth every 6 (six) hours as needed for severe pain (pain score 7-10) or moderate pain (pain score 4-6).   lactulose 10 GM/15ML solution Commonly known as: CHRONULAC Take 15 mLs (10 g total) by mouth daily.   LORazepam 1 MG tablet Commonly known as: Ativan Take 0.5 tablets (0.5 mg total) by mouth every 4 (four) hours as needed for anxiety.   morphine CONCENTRATE 10 mg / 0.5 ml concentrated solution Take 0.25 mLs (5 mg total) by mouth every 2 (two) hours as needed for severe pain (pain score 7-10).   morphine 15 MG 12 hr tablet Commonly known as: MS Contin Take 1 tablet (15 mg total) by mouth every 12 (twelve)  hours.   ondansetron 4 MG disintegrating tablet Commonly known as: ZOFRAN-ODT Take 1 tablet (4 mg total) by mouth every 8 (eight) hours as needed.   pantoprazole 40 MG tablet Commonly known as: PROTONIX Take 1 tablet (40 mg total) by mouth 2 (two) times daily before Jaheim Canino meal.   promethazine 25 MG tablet Commonly known as: PHENERGAN Take 1 tablet (25 mg total) by mouth every 6 (six) hours as needed for refractory nausea / vomiting.       No Known Allergies    The results of significant diagnostics from this hospitalization (including imaging, microbiology, ancillary and laboratory) are listed below for reference.    Significant Diagnostic Studies: IR IMAGE GUIDED DRAINAGE PERCUT CATH  PERITONEAL RETROPERIT Result Date: 08/14/2023 CLINICAL DATA:  Refractory malignant ascites secondary to metastatic hepatocellular carcinoma. Request for tunneled peritoneal drainage catheter for palliative drainage of ascites. EXAM: INSERTION OF TUNNELED PERITONEAL DRAINAGE CATHETER ANESTHESIA/SEDATION: 0.5 mg IV Versed was administered and the patient did not receive formal moderate conscious sedation. MEDICATIONS: 2 g IV Ancef. Antibiotic was administered in an appropriate  time interval for the procedure. FLUOROSCOPY: 18 seconds.  1.0 mGy. PROCEDURE: The procedure, risks, benefits, and alternatives were explained to the patient's wife. Questions regarding the procedure were encouraged and answered. The patient's wife understands and consents to the procedure. Letzy Gullickson time-out was performed prior to initiating the procedure The right abdominal wall was prepped with chlorhexidine in Indira Sorenson sterile fashion, and Shlonda Dolloff sterile drape was applied covering the operative field. Alaira Level sterile gown and sterile gloves were used for the procedure. Local anesthesia was provided with 1% Lidocaine. Ultrasound image documentation was performed. Fluoroscopy during the procedure and fluoroscopic spot radiograph confirms appropriate catheter position.  After creating Shashwat Cleary small skin incision, Myrna Vonseggern 19 gauge needle was advanced into the peritoneal cavity under ultrasound guidance. Malynn Lucy guide wire was then advanced under fluoroscopy into the peritoneal cavity. Peritoneal access was dilated serially and Karle Desrosier 16-French peel-away sheath placed. Averie Meiner 15.5 French tunneled PleurX catheter was placed. This was tunneled from an incision 5 cm below the peritoneal access to the access site. The catheter was advanced through the peel-away sheath. The sheath was then removed. Final catheter positioning was confirmed with Tyberius Ryner fluoroscopic spot image. The peritoneal access incision was closed with subcuticular 4-0 Vicryl. Dermabond was applied to the incision. Kyrielle Urbanski Prolene retention suture was applied at the catheter exit site. Large volume paracentesis was performed through the new catheter utilizing drainage bottles. COMPLICATIONS: None. FINDINGS: The catheter was placed via the right abdominal wall. Catheter course is superior and across the peritoneal cavity to the left upper quadrant. Approximately 4.5 liters of ascites was able to be removed after catheter placement. IMPRESSION: Placement of tunneled peritoneal drainage catheter via right abdominal approach. 4.5 liters of ascites was removed today after catheter placement. Electronically Signed   By: Irish Lack M.D.   On: 08/14/2023 15:55   US Paracentesis Result Date: 08/03/2023 INDICATION: Patient with history of stage IV hepatocellular carcinoma, cirrhosis, chronic portal vein thrombosis, anemia, acute kidney injury, recurrent ascites. Request received for diagnostic and therapeutic paracentesis up to 6 liters. EXAM: ULTRASOUND GUIDED DIAGNOSTIC AND THERAPEUTIC PARACENTESIS MEDICATIONS: 8 mL 1% lidocaine COMPLICATIONS: None immediate. PROCEDURE: Informed written consent was obtained from the patient after Reyana Leisey discussion of the risks, benefits and alternatives to treatment. Kaileena Obi timeout was performed prior to the initiation of the  procedure. Initial ultrasound scanning demonstrates Ammie Warrick moderate amount of ascites within the right mid to lower abdominal quadrant. The right mid to lower abdomen was prepped and draped in the usual sterile fashion. 1% lidocaine was used for local anesthesia. Following this, Stashia Sia 19 gauge, 7-cm, Yueh catheter was introduced. An ultrasound image was saved for documentation purposes. The paracentesis was performed. The catheter was removed and Caniya Tagle dressing was applied. The patient tolerated the procedure well without immediate post procedural complication. FINDINGS: Taggart Prasad total of approximately 3.3 liters of bloody fluid was removed. Samples were sent to the laboratory as requested by the clinical team. IMPRESSION: Successful ultrasound-guided diagnostic and therapeutic paracentesis yielding 3.3 liters of peritoneal fluid. Performed by: Artemio Aly Electronically Signed   By: Corlis Leak M.D.   On: 08/03/2023 16:17   US RENAL Result Date: 07/31/2023 CLINICAL DATA:  Acute kidney injury EXAM: RENAL / URINARY TRACT ULTRASOUND COMPLETE COMPARISON:  CT 07/28/2023 FINDINGS: Right Kidney: Renal measurements: 10.9 x 4.6 x 4.7 cm = volume: 124.7 mL. Cortex appears echogenic. No mass or hydronephrosis. Left Kidney: Renal measurements: 12.1 x 5.7 x 5.8 cm = volume: 208.9 mL. Echogenicity within normal limits. No mass or hydronephrosis visualized.  Bladder: Appears normal for degree of bladder distention. Other: Liver cirrhosis. At least moderate ascites in the right upper quadrant IMPRESSION: 1. Negative for hydronephrosis. Echogenic right renal cortex as can be seen with medical renal disease. 2. Liver cirrhosis with at least moderate ascites in the right upper quadrant. Electronically Signed   By: Jasmine Pang M.D.   On: 07/31/2023 19:06   US Paracentesis Result Date: 07/30/2023 INDICATION: Patient with history of stage IV hepatocellular carcinoma, cirrhosis, chronic portal vein thrombosis, anemia, acute kidney injury,  ascites. Request received for diagnostic and therapeutic paracentesis. EXAM: ULTRASOUND GUIDED DIAGNOSTIC AND THERAPEUTIC PARACENTESIS MEDICATIONS: 6 mL 1% lidocaine COMPLICATIONS: None immediate. PROCEDURE: Informed written consent was obtained from the patient after Shaquanda Graves discussion of the risks, benefits and alternatives to treatment. Roxane Puerto timeout was performed prior to the initiation of the procedure. Initial ultrasound scanning demonstrates Brason Berthelot moderate amount of ascites within the right mid to lower abdominal quadrant. The right mid to lower abdomen was prepped and draped in the usual sterile fashion. 1% lidocaine was used for local anesthesia. Following this, Gleen Ripberger 19 gauge, 7-cm, Yueh catheter was introduced. An ultrasound image was saved for documentation purposes. The paracentesis was performed. The catheter was removed and Shenequa Howse dressing was applied. The patient tolerated the procedure well without immediate post procedural complication. FINDINGS: Laurel Harnden total of approximately 2.4 liters of bloody fluid was removed. Samples were sent to the laboratory as requested by the clinical team. IMPRESSION: Successful ultrasound-guided diagnostic and therapeutic paracentesis yielding 2.4 liters of peritoneal fluid. Performed by: Jeananne Rama, PA-C Electronically Signed   By: Judie Petit.  Shick M.D.   On: 07/30/2023 14:28   CT ABDOMEN PELVIS W CONTRAST Result Date: 07/28/2023 CLINICAL DATA:  Lower abdominal pain and swelling status post radiation. Generalized weakness and decreased appetite. History of liver cancer, last treatment on Wednesday. EXAM: CT ABDOMEN AND PELVIS WITH CONTRAST TECHNIQUE: Multidetector CT imaging of the abdomen and pelvis was performed using the standard protocol following bolus administration of intravenous contrast. RADIATION DOSE REDUCTION: This exam was performed according to the departmental dose-optimization program which includes automated exposure control, adjustment of the mA and/or kV according to patient  size and/or use of iterative reconstruction technique. CONTRAST:  OMNIPAQUE IOHEXOL 300 MG/ML  SOLN COMPARISON:  06/11/2023 FINDINGS: Lower chest: Bibasilar atelectasis/scarring. Retained metallic pellets in the lung bases. Hepatobiliary: Cirrhosis. Similar size of the mass centered in the left lobe of the liver measuring approximately 6 x 9 cm. Decompressed gallbladder. No biliary dilation. Pancreas: No ductal dilation or inflammatory changes. Spleen: Unremarkable. Adrenals/Urinary Tract: Unremarkable adrenal glands and kidneys. Unremarkable bladder. Stomach/Bowel: No bowel obstruction. Wall thickening about the small bowel and colon favored due to portal congestive colopathy/enteropathy. Normal appendix. Stomach is within normal limits. Vascular/Lymphatic: Extensive mesenteric and retroperitoneal adenopathy. Index lymph nodes as follows: Left periaortic node (series 2/image 42) measures 7.2 x 5.6 cm, previously 7.8 x 6.1 cm; Left periaortic node (series 2/image 35) measures 5.4 x 4.3 cm, previously 5.2 x 4.3 cm; Porta hepatis node (series 2/image 34) measures 5.4 x 4.9 cm, previously 5.0 x 4.1 cm. 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 For example 1.3 cm nodule anterior to the gastric body (series 2/image 28). Unchanged size of the right rectus sheath metastasis measuring 2.8 x 3.3 cm. 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 Multiple varices contain nonocclusive thrombus. For example in Tarvis Blossom perigastric varix (series 7/image 60, Rilea Arutyunyan splenic  varix (7/71-77), and the left renal vein (7/65). Reproductive: No acute abnormality. Other: Large volume low-density abdominopelvic ascites. No free intraperitoneal air. No abscess. Musculoskeletal: No acute fracture or destructive osseous lesion. IMPRESSION: 1. Cirrhosis with similar size of the mass centered in the left lobe of the liver. 2.  Extensive mesenteric and retroperitoneal adenopathy, some of which are slightly decreased in size while others are slightly increased in size. 3. 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 4. 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 New thrombus in the perigastric and perisplenic varices. 5. Wall thickening about the small bowel and colon favored due to portal congestive colopathy/enteropathy. 6. Large volume low-density abdominopelvic ascites. These results were called by telephone at the time of interpretation on 07/28/2023 at 10:28 pm to provider [facility] , who verbally acknowledged these results. Electronically Signed   By: Minerva Fester M.D.   On: 07/28/2023 22:32   DG Chest Portable 1 View Result Date: 07/28/2023 CLINICAL DATA:  History of diabetes and liver cancer, presenting with generalized weakness. EXAM: PORTABLE CHEST 1 VIEW COMPARISON:  June 11, 2023 FINDINGS: The right-sided venous [place] Alarid-Cath seen on the prior study has been removed. The heart size and mediastinal contours are within normal limits. Low lung volumes are noted. Breyon Sigg stable subcentimeter calcified lung nodule is seen within the periphery of the right lung base. There is no evidence of acute infiltrate, pleural effusion or pneumothorax. Numerous tiny radiopaque foreign bodies are again seen overlying the left upper quadrant. The visualized skeletal structures are unremarkable. IMPRESSION: Low lung volumes without evidence of acute or active cardiopulmonary disease. Electronically Signed   By: Aram Candela M.D.   On: 07/28/2023 19:51    Microbiology: No results found for this or any previous visit (from the past 240 hours).   Labs: Basic Metabolic Panel: Recent Labs  Lab 08/14/23 1715  NA 131*  K 6.6*  CL 103  CO2 15*  GLUCOSE 295*  BUN 71*  CREATININE 5.33*  CALCIUM 9.0   Liver Function Tests: No results for input(s): "AST", "ALT", "ALKPHOS",  "BILITOT", "PROT", "ALBUMIN" in the last 168 hours. No results for input(s): "LIPASE", "AMYLASE" in the last 168 hours. No results for input(s): "AMMONIA" in the last 168 hours. CBC: Recent Labs  Lab 08/14/23 1715  WBC 3.8*  HGB 10.3*  HCT 32.9*  MCV 100.0  PLT 89*   Cardiac Enzymes: No results for input(s): "CKTOTAL", "CKMB", "CKMBINDEX", "TROPONINI" in the last 168 hours. BNP: BNP (last 3 results) No results for input(s): "BNP" in the last 8760 hours.  ProBNP (last 3 results) No results for input(s): "PROBNP" in the last 8760 hours.  CBG: Recent Labs  Lab 08/13/23 2121 08/14/23 0807 08/14/23 1803 08/14/23 2130 08/15/23 0741  GLUCAP 338* 274* 303* 242* 186*       Signed:  Lacretia Nicks MD.  [facility] 08/15/2023, 8:57 AM

## 2023-08-17 ENCOUNTER — Encounter (HOSPITAL_COMMUNITY): Payer: Self-pay

## 2023-08-17 ENCOUNTER — Ambulatory Visit (HOSPITAL_COMMUNITY)

## 2023-08-17 ENCOUNTER — Encounter: Payer: Self-pay | Admitting: Hematology

## 2023-08-18 ENCOUNTER — Encounter: Payer: Self-pay | Admitting: Hematology

## 2023-08-21 NOTE — Progress Notes (Addendum)
  Radiation Oncology         [phone] ________________________________  Name: Troy [name] MRN: [id number]  Date of Service: 08/21/2023  DOB: Feb 19, 1956  Post Treatment Telephone Note  Diagnosis:  Recurrent metastatic hepatocellular carcinoma involving the retroperitoneal nodes (as documented in provider EOT note)  As per [name]- (Son) Patient has passed away on 2023-09-04 at 10:12pm.   Ruel Favors, LPN

## 2023-08-22 ENCOUNTER — Other Ambulatory Visit (HOSPITAL_COMMUNITY): Payer: Self-pay

## 2023-08-24 ENCOUNTER — Inpatient Hospital Stay: Admission: RE | Admit: 2023-08-24 | Source: Ambulatory Visit

## 2023-09-10 DEATH — deceased
